# Patient Record
Sex: Male | Born: 1945 | Race: White | Hispanic: No | Marital: Married | Smoking: Never smoker
Health system: Southern US, Community
[De-identification: ages and names within clinical notes are randomized; demographics above are authoritative.]

## PROBLEM LIST (undated history)

## (undated) DIAGNOSIS — I639 Cerebral infarction, unspecified: Secondary | ICD-10-CM

## (undated) DIAGNOSIS — I10 Essential (primary) hypertension: Secondary | ICD-10-CM

## (undated) DIAGNOSIS — K219 Gastro-esophageal reflux disease without esophagitis: Secondary | ICD-10-CM

## (undated) DIAGNOSIS — C801 Malignant (primary) neoplasm, unspecified: Secondary | ICD-10-CM

## (undated) DIAGNOSIS — T8859XA Other complications of anesthesia, initial encounter: Secondary | ICD-10-CM

## (undated) DIAGNOSIS — E785 Hyperlipidemia, unspecified: Secondary | ICD-10-CM

## (undated) DIAGNOSIS — E559 Vitamin D deficiency, unspecified: Secondary | ICD-10-CM

## (undated) DIAGNOSIS — Z860100 Personal history of colon polyps, unspecified: Secondary | ICD-10-CM

## (undated) DIAGNOSIS — E119 Type 2 diabetes mellitus without complications: Secondary | ICD-10-CM

## (undated) DIAGNOSIS — T4145XA Adverse effect of unspecified anesthetic, initial encounter: Secondary | ICD-10-CM

## (undated) DIAGNOSIS — Z9889 Other specified postprocedural states: Secondary | ICD-10-CM

## (undated) DIAGNOSIS — R112 Nausea with vomiting, unspecified: Secondary | ICD-10-CM

## (undated) HISTORY — PX: ESOPHAGUS SURGERY: SHX626

## (undated) HISTORY — DX: Gastro-esophageal reflux disease without esophagitis: K21.9

## (undated) HISTORY — DX: Personal history of colon polyps, unspecified: Z86.0100

## (undated) HISTORY — PX: COLONOSCOPY W/ POLYPECTOMY: SHX1380

## (undated) HISTORY — PX: TONSILLECTOMY: SUR1361

## (undated) HISTORY — DX: Personal history of colonic polyps: Z86.010

## (undated) HISTORY — DX: Hyperlipidemia, unspecified: E78.5

## (undated) HISTORY — DX: Vitamin D deficiency, unspecified: E55.9

---

## 1998-11-16 ENCOUNTER — Emergency Department (HOSPITAL_COMMUNITY): Admission: EM | Admit: 1998-11-16 | Discharge: 1998-11-16 | Payer: Self-pay | Admitting: Emergency Medicine

## 2000-08-27 ENCOUNTER — Ambulatory Visit (HOSPITAL_COMMUNITY): Admission: RE | Admit: 2000-08-27 | Discharge: 2000-08-27 | Payer: Self-pay | Admitting: Gastroenterology

## 2003-08-31 ENCOUNTER — Emergency Department (HOSPITAL_COMMUNITY): Admission: AD | Admit: 2003-08-31 | Discharge: 2003-08-31 | Payer: Self-pay | Admitting: Emergency Medicine

## 2003-09-05 ENCOUNTER — Emergency Department (HOSPITAL_COMMUNITY): Admission: EM | Admit: 2003-09-05 | Discharge: 2003-09-06 | Payer: Self-pay | Admitting: Emergency Medicine

## 2003-12-05 ENCOUNTER — Ambulatory Visit (HOSPITAL_COMMUNITY): Admission: RE | Admit: 2003-12-05 | Discharge: 2003-12-05 | Payer: Self-pay | Admitting: Internal Medicine

## 2008-02-18 ENCOUNTER — Inpatient Hospital Stay (HOSPITAL_COMMUNITY): Admission: EM | Admit: 2008-02-18 | Discharge: 2008-02-19 | Payer: Self-pay | Admitting: Emergency Medicine

## 2008-02-18 ENCOUNTER — Ambulatory Visit: Payer: Self-pay | Admitting: Cardiology

## 2010-10-21 NOTE — H&P (Signed)
NAME:  Ivan Stark, Ivan Stark NO.:  [id number]   MEDICAL RECORD NO.:  [id number]          PATIENT TYPE:  EMS   LOCATION:  MAJO                         FACILITY:  [facility]   PHYSICIAN:  Vania Rea, M.D. DATE OF BIRTH:  [birth year]   DATE OF ADMISSION:  02/17/2008  DATE OF DISCHARGE:                              HISTORY & PHYSICAL   PRIMARY CARE PHYSICIAN:  Lucky Cowboy, M.D.   CHIEF COMPLAINT:  Chest pain.   HISTORY OF PRESENT ILLNESS:  This is a 65 year old Caucasian gentleman  with a history of diabetes and hypertension with no previous cardiac  workup who was awakened from sleep at about 1 a.m. this morning with a  5/10 central chest pain radiating into his left axilla.  The patient  took an aspirin and eventually went back to sleep.  Later today the  patient was out mowing the lawn and said when he went back to rest he  had sudden onset of the same chest pain radiating to his arm.  There was  no associated nausea, diaphoresis, shortness of breath, or dizziness.  However, the patient took another aspirin and did not get relief, but  decided to come to the emergency room.  After about 2 hours his pain  spontaneously resolved.  The patient has no palpitations, no lower  extremity edema, and no shortness of breath, but because of his history  of diabetes and hypertension and his age, Hospitalists Service was  called to assist with management.   PAST MEDICAL HISTORY:  As noted above.   MEDICATIONS:  1. Amaryl unknown dose.  2. Blood pressure medication unknown name.  3. Aspirin 81 mg daily.  4. Multivitamin and vitamin C daily.   ALLERGIES:  Allergic to an ANTIBIOTIC, he is unsure of the name.   SOCIAL HISTORY:  He is a priest by religion.  He denies tobacco,  alcohol, or illicit drug use.   FAMILY HISTORY:  There is no family history of coronary artery disease,  strokes, hypertension, or diabetes.   REVIEW OF SYSTEMS:  Other than noted above, a 10-point  review of systems  is significant only for the fact that he says he has been undergoing a  lot of personal stress recently related to his job.   PHYSICAL EXAMINATION:  GENERAL:  A pleasant, somewhat obese, middle-age  Caucasian gentleman lying on the stretcher in no acute distress.  VITAL SIGNS:  Temperature 98.3, pulse 72, respirations 16, blood  pressure __________, he is saturating at 97% on 2 liters.  HEENT:  His pupils are round and equal.  Mucous membranes are pink and  anicteric.  He has no jugular venous distention, no thyromegaly, no  carotid bruits.  CHEST:  Clear to auscultation bilaterally.  CARDIOVASCULAR:  Regular rhythm without murmur.  ABDOMEN:  Obese, soft, and nontender.  EXTREMITIES:  Without edema.  CENTRAL NERVOUS SYSTEM:  Cranial nerves II-XII grossly intact.  He has  no focal neurologic deficit.   LABORATORY DATA:  His white count is slightly elevated at 11.4, his CBC  is otherwise unremarkable.  His serum chemistry is  likewise  unremarkable.  His serum glucose is remarkably normal at 109.  Her serum  potassium is 4.7.   Chest x-ray shows no acute abnormalities.  His EKG done in 2005 showed  sinus bradycardia and was otherwise normal.  His current EKG is not  available to me at this time, but reportedly showed sinus rhythm with no  acute abnormalities.   Cardiac enzymes are completely normal with undetectable CK-MB and  undetectable troponin.   ASSESSMENT:  1. Chest pain.  Likelihood of acute coronary syndrome is low in the      setting of normal cardiac enzymes after prolonged chest pain.  2. Hypertension, controlled.  3. Diabetes type 2, controlled.   PLAN:  We will bring this gentleman overnight for serial cardiac enzymes  and will get treadmill stress test in the morning because of his age and  other risk factors.  We will follow up on his EKG and we will get EKG  p.r.n. for chest pain.  Other plans as per orders.      Vania Rea, M.D.   Electronically Signed     LC/MEDQ  D:  02/17/2008  T:  02/18/2008  Job:  [id number]   cc:   Lucky Cowboy, M.D.

## 2010-10-21 NOTE — Discharge Summary (Signed)
NAME:  Ivan Stark, ABREU NO.:  [id number]   MEDICAL RECORD NO.:  [id number]          PATIENT TYPE:  INP   LOCATION:  4742                         FACILITY:  [facility]   PHYSICIAN:  Theodosia Paling, MD    DATE OF BIRTH:  Nov 13, 1945   DATE OF ADMISSION:  02/17/2008  DATE OF DISCHARGE:                               DISCHARGE SUMMARY   ADMITTING HISTORY:  Please refer to the admission note dictated by Dr.  Vania Rea on February 18, 2008.   DISCHARGE DIAGNOSES:  1. Chest pain of noncardiac etiology, most likely related to the      gastrointestinal source/gastroesophageal reflux disease.  2. Hypertension.  3. Gastroesophageal reflux disease.  4. Diabetes mellitus.   DISCHARGE MEDICATIONS:  The patient is to continue home medication for  diabetes and hypertension whose name he does not remember at this time.  The patient was on sliding-scale insulin and captopril while he was  admitted to the hospital.   NEW MEDICATIONS:  The patient is to start taking Protonix 40 mg p.o.  daily along with the p.r.n. Maalox for GERD.   HOSPITAL COURSE:  Following issues were addressed during the  hospitalization:  1. Chest pain.  The patient had negative cardiac enzyme.  He did not      have any recurrence of the chest pain.  He underwent a stress test,      which was negative.  His telemetry did not show any changes.  The      patient is recommended to continue low-dose aspirin, most likely      caused this GERD or related to GI.  2. GERD.  The patient is to continue PPI.  The patient also has a      history of dysphagia.  Therefore, he was recommended to have an      outpatient endoscopy.  He has a GI specialist with a history of      GERD.  He has undergone endoscopy in the past.  He understands that      it is important that he gets it done as an outpatient earlier the      better.  3. Hypertension.  The patient's blood pressure was within normal      range.  Captopril was  started in the hospital.  4. Diabetes.  His glucose were within normal limits, on sliding-scale      insulin.   DISPOSITION:  1. The patient is to follow with primary care physician in 1 week's      time.  2. The patient is to follow with the GI specialist for outpatient      endoscopy evaluation.   PROCEDURE PERFORMED:  Myocardial perfusion test showed EF of 60% with  end-diastolic volume of 107.  Fixed defect in the inferior wall, most  likely diaphragmatic attenuation.  No stress induced ischemia.  A chest  x-ray was done on admission, which showed no acute findings or  infiltrates.   Total time spent in discharge of this patient is 35 minutes.      Theodosia Paling, MD  Electronically Signed     NP/MEDQ  D:  02/19/2008  T:  02/19/2008  Job:  [id number]   cc:   Lucky Cowboy, M.D.

## 2011-03-11 LAB — POCT I-STAT, CHEM 8
Calcium, Ion: 1.17
Creatinine, Ser: 1.2
Glucose, Bld: 109 — ABNORMAL HIGH
Hemoglobin: 17.7 — ABNORMAL HIGH
Sodium: 140
TCO2: 24

## 2011-03-11 LAB — GLUCOSE, CAPILLARY
Glucose-Capillary: 140 — ABNORMAL HIGH
Glucose-Capillary: 158 — ABNORMAL HIGH
Glucose-Capillary: 171 — ABNORMAL HIGH
Glucose-Capillary: 190 — ABNORMAL HIGH

## 2011-03-11 LAB — DIFFERENTIAL
Basophils Absolute: 0
Basophils Relative: 0
Eosinophils Absolute: 0.1
Eosinophils Relative: 1
Lymphocytes Relative: 27

## 2011-03-11 LAB — URINALYSIS, ROUTINE W REFLEX MICROSCOPIC
Glucose, UA: NEGATIVE
Nitrite: NEGATIVE
pH: 5

## 2011-03-11 LAB — CARDIAC PANEL(CRET KIN+CKTOT+MB+TROPI)
CK, MB: 0.7
Relative Index: INVALID
Troponin I: 0.01
Troponin I: <0.01

## 2011-03-11 LAB — CBC
HCT: 51.9
MCHC: 32.7
Platelets: 254
RDW: 13.5

## 2011-03-11 LAB — TROPONIN I: Troponin I: 0.01

## 2011-03-11 LAB — CK TOTAL AND CKMB (NOT AT ARMC)
CK, MB: 0.8
Total CK: 45

## 2011-03-11 LAB — PROTIME-INR: Prothrombin Time: 12.9

## 2011-03-11 LAB — HEMOGLOBIN A1C
Hgb A1c MFr Bld: 7.8 — ABNORMAL HIGH
Mean Plasma Glucose: 177

## 2011-03-11 LAB — TSH: TSH: 1.341

## 2011-03-11 LAB — LIPID PANEL: VLDL: 23

## 2012-07-31 ENCOUNTER — Encounter (HOSPITAL_COMMUNITY): Payer: Self-pay | Admitting: Physical Medicine and Rehabilitation

## 2012-07-31 ENCOUNTER — Emergency Department (HOSPITAL_COMMUNITY): Payer: Medicare Other

## 2012-07-31 ENCOUNTER — Emergency Department (HOSPITAL_COMMUNITY)
Admission: EM | Admit: 2012-07-31 | Discharge: 2012-07-31 | Disposition: A | Discharge status: Home / Self Care | Payer: Medicare Other | Attending: Emergency Medicine | Admitting: Emergency Medicine

## 2012-07-31 DIAGNOSIS — I10 Essential (primary) hypertension: Secondary | ICD-10-CM | POA: Insufficient documentation

## 2012-07-31 DIAGNOSIS — R0789 Other chest pain: Secondary | ICD-10-CM | POA: Insufficient documentation

## 2012-07-31 DIAGNOSIS — Z79899 Other long term (current) drug therapy: Secondary | ICD-10-CM | POA: Insufficient documentation

## 2012-07-31 DIAGNOSIS — E119 Type 2 diabetes mellitus without complications: Secondary | ICD-10-CM | POA: Insufficient documentation

## 2012-07-31 DIAGNOSIS — Z7982 Long term (current) use of aspirin: Secondary | ICD-10-CM | POA: Insufficient documentation

## 2012-07-31 DIAGNOSIS — R079 Chest pain, unspecified: Secondary | ICD-10-CM

## 2012-07-31 HISTORY — DX: Type 2 diabetes mellitus without complications: E11.9

## 2012-07-31 HISTORY — DX: Essential (primary) hypertension: I10

## 2012-07-31 LAB — BASIC METABOLIC PANEL
BUN: 15 mg/dL (ref 6–23)
Chloride: 105 mEq/L (ref 96–112)
Creatinine, Ser: 0.87 mg/dL (ref 0.50–1.35)
GFR calc Af Amer: >90 mL/min (ref >90)

## 2012-07-31 LAB — CBC WITH DIFFERENTIAL/PLATELET
Basophils Relative: 1 % (ref 0–1)
Eosinophils Absolute: 0.1 10*3/uL (ref 0.0–0.7)
HCT: 43.8 % (ref 39.0–52.0)
Hemoglobin: 15.6 g/dL (ref 13.0–17.0)
MCH: 31.1 pg (ref 26.0–34.0)
MCHC: 35.6 g/dL (ref 30.0–36.0)
Monocytes Absolute: 0.7 10*3/uL (ref 0.1–1.0)
Monocytes Relative: 9 % (ref 3–12)
Neutro Abs: 5 10*3/uL (ref 1.7–7.7)

## 2012-07-31 MED ORDER — ASPIRIN 81 MG PO CHEW
324.0000 mg | CHEWABLE_TABLET | Freq: Once | ORAL | Status: AC
  Administered 2012-07-31: 324 mg via ORAL
  Filled 2012-07-31: qty 4

## 2012-07-31 NOTE — ED Notes (Signed)
Pt presents to department for evaluation of L sided non radiating chest pain. Ongoing x2 days. States pain became worse this morning, increases with deep breathing. Denies pain at the time. Pt is conscious alert and oriented x4. Respirations unlabored. Skin warm and dry.

## 2012-07-31 NOTE — ED Provider Notes (Addendum)
History     CSN: [id number]  Arrival date & time 07/31/12  2130   First MD Initiated Contact with Patient 07/31/12 [phone]      Chief Complaint  Patient presents with  . Chest Pain    (Consider location/radiation/quality/duration/timing/severity/associated sxs/prior treatment) HPI Comments: Ivan Stark is a 67 y.o. male who states that he has had chest pain for 2 days that waxes and wanes. The pain is constant. It does not keep him awake at night. The pain is 1-2/10, and is characterized as a dullness. It is, in the left anterior chest. It does not radiate. He has not had any associated fever, chills, cough, sweating, nausea, vomiting, abdominal pain, back pain, weakness, or dizziness. He's never had this before. He took some aspirin during the night, when he awoke. He is not sure why he awoke at around midnight, but was able to sleep, afterwards. There are no known modifying factors.  Patient is a 67 y.o. male presenting with chest pain. The history is provided by the patient.  Chest Pain   Past Medical History  Diagnosis Date  . Hypertension   . Diabetes mellitus without complication     No past surgical history on file.  History reviewed. No pertinent family history.  History  Substance Use Topics  . Smoking status: Never Smoker   . Smokeless tobacco: Not on file  . Alcohol Use: No      Review of Systems  Cardiovascular: Positive for chest pain.  All other systems reviewed and are negative.    Allergies  Review of patient's allergies indicates no known allergies.  Home Medications   Current Outpatient Rx  Name  Route  Sig  Dispense  Refill  . Ascorbic Acid (VITAMIN C) 1000 MG tablet   Oral   Take 1,000 mg by mouth daily.         Marland Kitchen aspirin EC 81 MG tablet   Oral   Take 81 mg by mouth daily.         . Cholecalciferol (VITAMIN D) 2000 UNITS CAPS   Oral   Take 2 capsules by mouth daily.         . enalapril (VASOTEC) 20 MG tablet   Oral   Take 20  mg by mouth at bedtime.         Marland Kitchen glimepiride (AMARYL) 4 MG tablet   Oral   Take 4 mg by mouth 2 (two) times daily with a meal.         . Multiple Vitamin (MULTIVITAMIN WITH MINERALS) TABS   Oral   Take 1 tablet by mouth daily.         . pravastatin (PRAVACHOL) 40 MG tablet   Oral   Take 40 mg by mouth at bedtime.           BP 131/104  Pulse 57  Temp(Src) 98 F (36.7 C) (Oral)  Resp 14  SpO2 99%  Physical Exam  Nursing note and vitals reviewed. Constitutional: He is oriented to person, place, and time. He appears well-developed and well-nourished.  HENT:  Head: Normocephalic and atraumatic.  Right Ear: External ear normal.  Left Ear: External ear normal.  Eyes: Conjunctivae and EOM are normal. Pupils are equal, round, and reactive to light.  Neck: Normal range of motion and phonation normal. Neck supple.  Cardiovascular: Normal rate, regular rhythm, normal heart sounds and intact distal pulses.   Pulmonary/Chest: Effort normal and breath sounds normal. He exhibits no bony tenderness.  Abdominal: Soft. Normal appearance. There is no tenderness.  Musculoskeletal: Normal range of motion.  Neurological: He is alert and oriented to person, place, and time. He has normal strength. No cranial nerve deficit or sensory deficit. He exhibits normal muscle tone. Coordination normal.  Skin: Skin is warm, dry and intact.  Psychiatric: He has a normal mood and affect. His behavior is normal. Judgment and thought content normal.    ED Course  Procedures (including critical care time)  Aspirin ordered  Reevaluation:- No recurrence of the CP. Vitals are stable.    Date: 03/25/2012  Rate: 61  Rhythm: normal sinus rhythm  QRS Axis: normal  PR and QT Intervals: normal  ST/T Wave abnormalities: normal  PR and QRS Conduction Disutrbances:none  Narrative Interpretation:   Old EKG Reviewed: unchanged    Labs Reviewed  BASIC METABOLIC PANEL - Abnormal; Notable for the  following:    Glucose, Bld 157 (*)    GFR calc non Af Amer 89 (*)    All other components within normal limits  CBC WITH DIFFERENTIAL  POCT I-STAT TROPONIN I   Dg Chest 2 View  07/31/2012  *RADIOLOGY REPORT*  Clinical Data: left-sided chest pain  CHEST - 2 VIEW  Comparison: 02/17/2008  Findings: Cardiomediastinal silhouette is stable.  No acute infiltrate or pleural effusion.  No pulmonary edema.  Bony thorax is stable.  IMPRESSION: No active disease.  No significant change.   Original Report Authenticated By: Natasha Mead, M.D.    Nursing Notes Reviewed/ Care Coordinated Applicable Imaging Reviewed Interpretation of Laboratory Data incorporated into ED treatment  1. Nonspecific chest pain       MDM  Nonspecific chest pain, with negative ED evaluation. Doubt ACS, PE, or pneumonia. He is stable for discharge. He has cardiac risk factors, TIMI 1, and can be evaluated, and treated as an outpatient. He does have a regular primary care provider.   Plan: Home Medications- usual and increase to 325 ASA q day; Home Treatments- rest; Recommended follow up- PCP for stress test asap    Flint Melter, MD 07/31/12 1300  Flint Melter, MD 07/31/12 [phone]

## 2013-05-15 ENCOUNTER — Encounter: Payer: Self-pay | Admitting: Internal Medicine

## 2013-05-15 DIAGNOSIS — E785 Hyperlipidemia, unspecified: Secondary | ICD-10-CM | POA: Insufficient documentation

## 2013-05-15 DIAGNOSIS — I10 Essential (primary) hypertension: Secondary | ICD-10-CM | POA: Insufficient documentation

## 2013-05-15 DIAGNOSIS — E559 Vitamin D deficiency, unspecified: Secondary | ICD-10-CM | POA: Insufficient documentation

## 2013-05-15 DIAGNOSIS — K219 Gastro-esophageal reflux disease without esophagitis: Secondary | ICD-10-CM | POA: Insufficient documentation

## 2013-05-15 DIAGNOSIS — Z86010 Personal history of colonic polyps: Secondary | ICD-10-CM | POA: Insufficient documentation

## 2013-05-17 ENCOUNTER — Ambulatory Visit: Payer: Self-pay | Admitting: Emergency Medicine

## 2013-06-22 ENCOUNTER — Other Ambulatory Visit: Payer: Self-pay | Admitting: Internal Medicine

## 2013-06-26 ENCOUNTER — Other Ambulatory Visit: Payer: Self-pay | Admitting: Internal Medicine

## 2013-07-17 ENCOUNTER — Other Ambulatory Visit: Payer: Self-pay | Admitting: Internal Medicine

## 2013-08-03 ENCOUNTER — Other Ambulatory Visit: Payer: Self-pay | Admitting: Internal Medicine

## 2013-08-16 ENCOUNTER — Other Ambulatory Visit: Payer: Self-pay | Admitting: Internal Medicine

## 2013-08-20 ENCOUNTER — Encounter: Payer: Self-pay | Admitting: Internal Medicine

## 2013-08-20 DIAGNOSIS — N181 Chronic kidney disease, stage 1: Secondary | ICD-10-CM

## 2013-08-20 DIAGNOSIS — Z79899 Other long term (current) drug therapy: Secondary | ICD-10-CM | POA: Insufficient documentation

## 2013-08-20 DIAGNOSIS — E1122 Type 2 diabetes mellitus with diabetic chronic kidney disease: Secondary | ICD-10-CM | POA: Insufficient documentation

## 2013-08-20 NOTE — Progress Notes (Signed)
Patient ID: Ivan Stark, male   DOB: Sep 18, 1945, 68 y.o.   MRN: [id number]   Annual Screening Comprehensive Examination  This very nice 68 y.o.  MWM presents for complete physical.  Patient has been followed for HTN,  T2 NIDDM, Hyperlipidemia, and Vitamin D Deficiency.   HTN predates since 1998. Patient's BP has been controlled at home.Today's BP: 120/82 mmHg. Patient denies any cardiac symptoms as chest pain, palpitations, shortness of breath, dizziness or ankle swelling.   Patient's hyperlipidemia is not controlled with diet and he's been off of his Pravastatin. Patient denies myalgias or other medication SE's. Last cholesterol last visit was  237, triglycerides 166, HDL 41 and LDL 163 in Sept 2014 - not at goal.     Patient has T2 NIDDM since 1998 with last A1c 9.4% in Sept 2014  representative of his poor compliance over the years. Patient denies reactive hypoglycemic symptoms, visual blurring, diabetic polys, or paresthesias.   Finally, patient has history of Vitamin D Deficiency  Of 32 in 2010 andbwith last vitamin D 83 in Sept 2014.     Medication Sig  . Ascorbic Acid (VITAMIN C) 1000 MG tablet Take 1,000 mg by mouth daily.  Marland Kitchen aspirin EC 81 MG tablet Take 81 mg by mouth daily.  . Cholecalciferol (VITAMIN D) 2000 UNITS CAPS Take 2 capsules by mouth daily.  . enalapril (VASOTEC) 20 MG tablet TAKE ONE TABLET BY MOUTH ONCE DAILY  . metFORMIN (GLUCOPHAGE-XR) 500 MG 24 hr tablet TAKE FOUR TABLETS BY MOUTH ONCE DAILY  . Multiple Vitamin (MULTIVITAMIN WITH MINERALS) TABS Take 1 tablet by mouth daily.  . pravastatin (PRAVACHOL) 40 MG tablet Take 40 mg by mouth at bedtime.      Allergies  Allergen Reactions  . Atenolol   . Lipitor [Atorvastatin]     Itch    Past Medical History  Diagnosis Date  . Diabetes mellitus without complication   . Hypertension   . Hyperlipidemia   . GERD (gastroesophageal reflux disease)   . Vitamin D deficiency   . History of colon polyps     Past  Surgical History  Procedure Laterality Date  . Tonsillectomy      Family History  Problem Relation Age of Onset  . Goiter Mother   . Hypertension Father   . Cancer Father     colon    History   Social History  . Marital Status: Married    Spouse Name: N/A    Number of Children: N/A  . Years of Education: N/A   Occupational History  . [employer] x 44 years.   Social History Main Topics  . Smoking status: Never Smoker   . Smokeless tobacco: Not on file  . Alcohol Use: No  . Drug Use: No  . Sexual Activity: Not on file    ROS Constitutional: Denies fever, chills, weight loss/gain, headaches, insomnia, fatigue, night sweats, and change in appetite. Eyes: Denies redness, blurred vision, diplopia, discharge, itchy, watery eyes.  ENT: Denies discharge, congestion, post nasal drip, epistaxis, sore throat, earache, hearing loss, dental pain, Tinnitus, Vertigo, Sinus pain, snoring.  Cardio: Denies chest pain, palpitations, irregular heartbeat, syncope, dyspnea, diaphoresis, orthopnea, PND, claudication, edema Respiratory: denies cough, dyspnea, DOE, pleurisy, hoarseness, laryngitis, wheezing.  Gastrointestinal: Denies dysphagia, heartburn, reflux, water brash, pain, cramps, nausea, vomiting, bloating, diarrhea, constipation, hematemesis, melena, hematochezia, jaundice, hemorrhoids Genitourinary: Denies dysuria, frequency, urgency, nocturia, hesitancy, discharge, hematuria, flank pain Musculoskeletal: Denies arthralgia, myalgia, stiffness, Jt. Swelling, pain, limp, and strain/sprain. Skin:  Denies puritis, rash, hives, warts, acne, eczema, changing in skin lesion Neuro: No weakness, tremor, incoordination, spasms, paresthesia, pain Psychiatric: Denies confusion, memory loss, sensory loss Endocrine: Denies change in weight, skin, hair change, nocturia, and paresthesia, diabetic polys, visual blurring, hyper / hypo glycemic episodes.  Heme/Lymph: No excessive bleeding, bruising, or  elarged lymph nodes.  Physical Exam  BP 120/82  Pulse 72  Temp(Src) 97.9 F (36.6 C) (Temporal)  Resp 16  Ht 6\' 4"  (1.93 m)  Wt 213 lb (96.616 kg)  BMI 25.94 kg/m2  General Appearance: Well nourished, in no apparent distress. Eyes: PERRLA, EOMs, conjunctiva no swelling or erythema, normal fundi and vessels. Sinuses: No frontal/maxillary tenderness ENT/Mouth: EACs patent / TMs  nl. Nares clear without erythema, swelling, mucoid exudates. Oral hygiene is good. No erythema, swelling, or exudate. Tongue normal, non-obstructing. Tonsils not swollen or erythematous. Hearing normal.  Neck: Supple, thyroid normal. No bruits, nodes or JVD. Respiratory: Respiratory effort normal.  BS equal and clear bilateral without rales, rhonci, wheezing or stridor. Cardio: Heart sounds are normal with regular rate and rhythm and no murmurs, rubs or gallops. Peripheral pulses are normal and equal bilaterally without edema. No aortic or femoral bruits. Chest: symmetric with normal excursions and percussion.  Abdomen: Flat, soft, with bowl sounds. Nontender, no guarding, rebound, hernias, masses, or organomegaly.  Lymphatics: Non tender without lymphadenopathy.  Genitourinary: No hernias.Testes nl. DRE - prostate nl for age - smooth & firm w/o nodules. Musculoskeletal: Full ROM all peripheral extremities, joint stability, 5/5 strength, and normal gait. Skin: Warm and dry without rashes, lesions, cyanosis, clubbing or  ecchymosis.  Neuro: Cranial nerves intact, reflexes equal bilaterally. Normal muscle tone, no cerebellar symptoms. Sensation intact.  Pysch: Awake and oriented X 3, normal affect, insight and judgment appropriate.   Assessment and Plan  1. Annual Screening Examination 2. Hypertension  3. Hyperlipidemia 4. T2 NIDDM 5. Vitamin D Deficiency  Continue prudent diet as discussed, weight control, BP monitoring, regular exercise, and medications as discussed.  Discussed med effects and SE's. Routine  screening labs and tests as requested with regular follow-up as recommended.

## 2013-08-20 NOTE — Patient Instructions (Signed)

## 2013-08-21 ENCOUNTER — Encounter: Payer: Self-pay | Admitting: Internal Medicine

## 2013-08-21 ENCOUNTER — Ambulatory Visit (INDEPENDENT_AMBULATORY_CARE_PROVIDER_SITE_OTHER): Payer: Medicare Other | Admitting: Internal Medicine

## 2013-08-21 VITALS — BP 120/82 | HR 72 | Temp 97.9°F | Resp 16 | Ht 76.0 in | Wt 213.0 lb

## 2013-08-21 DIAGNOSIS — E785 Hyperlipidemia, unspecified: Secondary | ICD-10-CM

## 2013-08-21 DIAGNOSIS — Z1212 Encounter for screening for malignant neoplasm of rectum: Secondary | ICD-10-CM

## 2013-08-21 DIAGNOSIS — Z79899 Other long term (current) drug therapy: Secondary | ICD-10-CM

## 2013-08-21 DIAGNOSIS — Z9181 History of falling: Secondary | ICD-10-CM

## 2013-08-21 DIAGNOSIS — Z125 Encounter for screening for malignant neoplasm of prostate: Secondary | ICD-10-CM

## 2013-08-21 DIAGNOSIS — Z1331 Encounter for screening for depression: Secondary | ICD-10-CM

## 2013-08-21 DIAGNOSIS — I10 Essential (primary) hypertension: Secondary | ICD-10-CM

## 2013-08-21 DIAGNOSIS — E559 Vitamin D deficiency, unspecified: Secondary | ICD-10-CM

## 2013-08-21 DIAGNOSIS — E119 Type 2 diabetes mellitus without complications: Secondary | ICD-10-CM

## 2013-08-21 LAB — CBC WITH DIFFERENTIAL/PLATELET
Basophils Absolute: 0.1 10*3/uL (ref 0.0–0.1)
Basophils Relative: 1 % (ref 0–1)
Eosinophils Absolute: 0.2 10*3/uL (ref 0.0–0.7)
Eosinophils Relative: 2 % (ref 0–5)
HCT: 46.1 % (ref 39.0–52.0)
Hemoglobin: 15.9 g/dL (ref 13.0–17.0)
LYMPHS PCT: 26 % (ref 12–46)
Lymphs Abs: 2.4 10*3/uL (ref 0.7–4.0)
MCH: 30.1 pg (ref 26.0–34.0)
MCHC: 34.5 g/dL (ref 30.0–36.0)
MCV: 87.3 fL (ref 78.0–100.0)
Monocytes Absolute: 0.9 10*3/uL (ref 0.1–1.0)
Monocytes Relative: 10 % (ref 3–12)
NEUTROS PCT: 61 % (ref 43–77)
Neutro Abs: 5.6 10*3/uL (ref 1.7–7.7)
PLATELETS: 249 10*3/uL (ref 150–400)
RBC: 5.28 MIL/uL (ref 4.22–5.81)
RDW: 13.5 % (ref 11.5–15.5)
WBC: 9.1 10*3/uL (ref 4.0–10.5)

## 2013-08-21 LAB — HEMOGLOBIN A1C
HEMOGLOBIN A1C: 8.4 % — AB (ref <5.7)
Mean Plasma Glucose: 194 mg/dL — ABNORMAL HIGH (ref <117)

## 2013-08-21 MED ORDER — GLIMEPIRIDE 4 MG PO TABS: ORAL_TABLET | ORAL | Status: DC

## 2013-08-22 LAB — BASIC METABOLIC PANEL WITH GFR
BUN: 12 mg/dL (ref 6–23)
CALCIUM: 9.3 mg/dL (ref 8.4–10.5)
CO2: 27 meq/L (ref 19–32)
Chloride: 102 mEq/L (ref 96–112)
Creat: 0.92 mg/dL (ref 0.50–1.35)
GFR, EST NON AFRICAN AMERICAN: 86 mL/min
Glucose, Bld: 144 mg/dL — ABNORMAL HIGH (ref 70–99)
Potassium: 4.5 mEq/L (ref 3.5–5.3)
SODIUM: 137 meq/L (ref 135–145)

## 2013-08-22 LAB — URINALYSIS, MICROSCOPIC ONLY
Bacteria, UA: NONE SEEN
CASTS: NONE SEEN
Crystals: NONE SEEN
SQUAMOUS EPITHELIAL / LPF: NONE SEEN

## 2013-08-22 LAB — HEPATIC FUNCTION PANEL
ALT: 21 U/L (ref 0–53)
AST: 19 U/L (ref 0–37)
Albumin: 3.5 g/dL (ref 3.5–5.2)
Alkaline Phosphatase: 73 U/L (ref 39–117)
BILIRUBIN TOTAL: 0.7 mg/dL (ref 0.2–1.2)
Bilirubin, Direct: 0.1 mg/dL (ref 0.0–0.3)
Indirect Bilirubin: 0.6 mg/dL (ref 0.2–1.2)
Total Protein: 6 g/dL (ref 6.0–8.3)

## 2013-08-22 LAB — LIPID PANEL
CHOL/HDL RATIO: 6.6 ratio
Cholesterol: 246 mg/dL — ABNORMAL HIGH (ref 0–200)
HDL: 37 mg/dL — AB (ref >39)
LDL CALC: 169 mg/dL — AB (ref 0–99)
Triglycerides: 199 mg/dL — ABNORMAL HIGH (ref <150)
VLDL: 40 mg/dL (ref 0–40)

## 2013-08-22 LAB — MAGNESIUM: Magnesium: 1.9 mg/dL (ref 1.5–2.5)

## 2013-08-22 LAB — TSH: TSH: 1.175 u[IU]/mL (ref 0.350–4.500)

## 2013-08-22 LAB — INSULIN, FASTING: INSULIN FASTING, SERUM: 15 u[IU]/mL (ref 3–28)

## 2013-08-22 LAB — MICROALBUMIN / CREATININE URINE RATIO
CREATININE, URINE: 64.5 mg/dL
MICROALB UR: 0.5 mg/dL (ref 0.00–1.89)
Microalb Creat Ratio: 7.8 mg/g (ref 0.0–30.0)

## 2013-08-22 LAB — PSA: PSA: 1.54 ng/mL (ref <=4.00)

## 2013-08-22 LAB — VITAMIN D 25 HYDROXY (VIT D DEFICIENCY, FRACTURES): Vit D, 25-Hydroxy: 96 ng/mL — ABNORMAL HIGH (ref 30–89)

## 2013-08-23 ENCOUNTER — Other Ambulatory Visit: Payer: Self-pay | Admitting: Internal Medicine

## 2013-08-23 MED ORDER — ATORVASTATIN CALCIUM 80 MG PO TABS: 80.0000 mg | ORAL_TABLET | Freq: Every day | ORAL | Status: DC

## 2013-09-20 ENCOUNTER — Other Ambulatory Visit: Payer: Self-pay | Admitting: Internal Medicine

## 2013-11-21 ENCOUNTER — Other Ambulatory Visit: Payer: Self-pay | Admitting: Internal Medicine

## 2013-11-24 ENCOUNTER — Ambulatory Visit: Payer: Self-pay | Admitting: Physician Assistant

## 2013-12-20 ENCOUNTER — Other Ambulatory Visit: Payer: Self-pay | Admitting: Internal Medicine

## 2014-02-04 DIAGNOSIS — Z9114 Patient's other noncompliance with medication regimen: Secondary | ICD-10-CM | POA: Insufficient documentation

## 2014-02-04 NOTE — Progress Notes (Signed)
Patient ID: Ivan Stark, male   DOB: May 06, 1946, 68 y.o.   MRN: [id number]  This very nice 68 y.o.MWM came Aug 21, 2013 for last OV and now 5 + months later, patient as usual presents 2 months overdue for recommended follow-up primarily to be able to get his meds.. 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   HTN predates since     . Patient's BP has been controlled at home.Today's BP: 112/76 mmHg. Patient denies any cardiac symptoms as chest pain, palpitations, shortness of breath, dizziness or ankle swelling.   Patient's hyperlipidemia is controlled with diet and medications. Patient denies myalgias or other medication SE's. Last lipids were  Chol 246; HDL 37; LDL  169; Trig 199 on 08/21/2013.   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 Last A1c was 8.4% on 08/21/2013. Patient alleges dietary compliance. Patient relates that he stopped his Metformin and has used his Glimepiride.     Finally, patient has history of Vitamin D Deficiency of 32 in 2010 and last vitamin D was 96 on 08/21/2013.  Medication Sig  . VITAMIN C 1000 MG tablet Take 1,000 mg  daily.  Marland Kitchen aspirin EC 81 MG  Take 81 mg  daily.  Marland Kitchen atorvastatin  80 MG tablet Take 1 tablet  Daily for cholesterol  . VITAMIN D 2000 UNITS  Take 2 capsules  daily.  . enalapril  20 MG tablet TAKE 1 tab daily  . glimepiride  4 MG tablet TAKE 1/2 to 1 tab 2 x day as directed  . metFORMIN -XR 500 MG 24 hr tablet TAKE 4 tabs daily as directed  . MULTIVIT W/ MIN Take 1 tablet  daily.  . pravastatin  40 MG tablet Take 40 mg at bedtime.   Allergies  Allergen Reactions  . Atenolol   . Lipitor [Atorvastatin]     Itch   Past Medical History  Diagnosis Date  . Diabetes mellitus without complication   . Hypertension   . Hyperlipidemia   . GERD (gastroesophageal reflux disease)   .  Vitamin D deficiency   . History of colon polyps    Past Surgical History  Procedure Laterality Date  . Tonsillectomy     Family History  Problem Relation Age of Onset  . Goiter Mother   . Hypertension Father   . Cancer Father     colon   History   Social History  . Marital Status: Married    Spouse Name: N/A    Number of Children: N/A  . Years of Education: N/A   Occupational History  . 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 Smoking status: Never Smoker   . Smokeless tobacco: Not on file  . Alcohol Use: No  . Drug Use: No  . Sexual Activity: Not on file    ROS Constitutional: Denies fever, chills, weight loss/gain, headaches, insomnia, fatigue, night sweats or change in appetite. Eyes: Denies redness, blurred vision, diplopia, discharge, itchy or watery eyes.  ENT: Denies discharge, congestion, post nasal drip, epistaxis, sore throat, earache, hearing loss, dental pain, Tinnitus, Vertigo, Sinus pain or snoring.  Cardio: Denies chest pain, palpitations, irregular heartbeat, syncope, dyspnea, diaphoresis, orthopnea, PND, claudication or edema Respiratory: denies cough, dyspnea, DOE, pleurisy, hoarseness, laryngitis or wheezing.  Gastrointestinal: Denies dysphagia, heartburn, reflux, water brash, pain, cramps,  nausea, vomiting, bloating, diarrhea, constipation, hematemesis, melena, hematochezia, jaundice or hemorrhoids Genitourinary: Denies dysuria, frequency, urgency, nocturia, hesitancy, discharge, hematuria or flank pain Musculoskeletal: Denies arthralgia, myalgia, stiffness, Jt. Swelling, pain, limp or strain/sprain. Denies Falls. Skin: Denies puritis, rash, hives, warts, acne, eczema or change in skin lesion Neuro: No weakness, tremor, incoordination, spasms, paresthesia or pain Psychiatric: Denies confusion, memory loss or sensory loss. Denies Depression. Endocrine: Denies change in weight, skin, hair change, nocturia, and paresthesia,  diabetic polys, visual blurring or hyper / hypo glycemic episodes.  Heme/Lymph: No excessive bleeding, bruising or enlarged lymph nodes.   Physical Exam  BP 112/76  Pulse 64  Temp 98.1 F   Resp 16  Ht 6\' 4"    Wt 211 lb   BMI 25.69   General Appearance: Well nourished, in no apparent distress. Eyes: PERRLA, EOMs, conjunctiva no swelling or erythema, normal fundi and vessels. Sinuses: No frontal/maxillary tenderness ENT/Mouth: EACs patent / TMs  nl. Nares clear without erythema, swelling, mucoid exudates. Oral hygiene is good. No erythema, swelling, or exudate. Tongue normal, non-obstructing. Tonsils not swollen or erythematous. Hearing normal.  Neck: Supple, thyroid normal. No bruits, nodes or JVD. Respiratory: Respiratory effort normal.  BS equal and clear bilateral without rales, rhonci, wheezing or stridor. Cardio: Heart sounds are normal with regular rate and rhythm and no murmurs, rubs or gallops. Peripheral pulses are normal and equal bilaterally without edema. No aortic or femoral bruits. Chest: symmetric with normal excursions and percussion.  Musculoskeletal: Full ROM all peripheral extremities, joint stability, 5/5 strength, and normal gait. Skin: Warm and dry without rashes, lesions, cyanosis, clubbing or  ecchymosis.  Neuro: Cranial nerves intact, reflexes equal bilaterally. Normal muscle tone, no cerebellar symptoms. Sensation intact.  Pysch: Awake and oriented X 3with normal affect, insight and judgment appropriate.    Assessment and Plan  1. Comprehensive Examination 2. Hypertension  3. Hyperlipidemia4. 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 Vitamin D Deficiency 6. Poor Compliance   Continue prudent diet as discussed, weight control, BP monitoring, regular exercise, and medications as discussed.  Discussed med effects and SE's. Routine screening labs and tests as requested with regular follow-up as recommended.   Patient was urged to restart his Metformin and only use the  Glimepiride if CBG> 180 mg%.

## 2014-02-04 NOTE — Patient Instructions (Signed)

## 2014-02-05 ENCOUNTER — Encounter: Payer: Self-pay | Admitting: Internal Medicine

## 2014-02-05 ENCOUNTER — Other Ambulatory Visit: Payer: Self-pay | Admitting: Internal Medicine

## 2014-02-05 ENCOUNTER — Ambulatory Visit (INDEPENDENT_AMBULATORY_CARE_PROVIDER_SITE_OTHER): Payer: Medicare Other | Admitting: Internal Medicine

## 2014-02-05 VITALS — BP 112/76 | HR 64 | Temp 98.1°F | Resp 16 | Ht 76.0 in | Wt 211.0 lb

## 2014-02-05 DIAGNOSIS — E559 Vitamin D deficiency, unspecified: Secondary | ICD-10-CM

## 2014-02-05 DIAGNOSIS — Z9114 Patient's other noncompliance with medication regimen: Secondary | ICD-10-CM

## 2014-02-05 DIAGNOSIS — Z1212 Encounter for screening for malignant neoplasm of rectum: Secondary | ICD-10-CM

## 2014-02-05 DIAGNOSIS — Z91148 Patient's other noncompliance with medication regimen for other reason: Secondary | ICD-10-CM

## 2014-02-05 DIAGNOSIS — Z79899 Other long term (current) drug therapy: Secondary | ICD-10-CM

## 2014-02-05 DIAGNOSIS — E1129 Type 2 diabetes mellitus with other diabetic kidney complication: Secondary | ICD-10-CM

## 2014-02-05 DIAGNOSIS — Z125 Encounter for screening for malignant neoplasm of prostate: Secondary | ICD-10-CM

## 2014-02-05 DIAGNOSIS — Z1331 Encounter for screening for depression: Secondary | ICD-10-CM

## 2014-02-05 DIAGNOSIS — I150 Renovascular hypertension: Secondary | ICD-10-CM

## 2014-02-05 DIAGNOSIS — Z789 Other specified health status: Secondary | ICD-10-CM

## 2014-02-05 DIAGNOSIS — E785 Hyperlipidemia, unspecified: Secondary | ICD-10-CM

## 2014-02-05 DIAGNOSIS — R42 Dizziness and giddiness: Secondary | ICD-10-CM

## 2014-02-05 LAB — CBC WITH DIFFERENTIAL/PLATELET
Basophils Absolute: 0 10*3/uL (ref 0.0–0.1)
Basophils Relative: 0 % (ref 0–1)
Eosinophils Absolute: 0.1 10*3/uL (ref 0.0–0.7)
Eosinophils Relative: 2 % (ref 0–5)
HEMATOCRIT: 46.7 % (ref 39.0–52.0)
HEMOGLOBIN: 16 g/dL (ref 13.0–17.0)
LYMPHS PCT: 29 % (ref 12–46)
Lymphs Abs: 2.1 10*3/uL (ref 0.7–4.0)
MCH: 30.2 pg (ref 26.0–34.0)
MCHC: 34.3 g/dL (ref 30.0–36.0)
MCV: 88.3 fL (ref 78.0–100.0)
MONO ABS: 0.7 10*3/uL (ref 0.1–1.0)
MONOS PCT: 10 % (ref 3–12)
NEUTROS ABS: 4.4 10*3/uL (ref 1.7–7.7)
NEUTROS PCT: 59 % (ref 43–77)
Platelets: 273 10*3/uL (ref 150–400)
RBC: 5.29 MIL/uL (ref 4.22–5.81)
RDW: 13.5 % (ref 11.5–15.5)
WBC: 7.4 10*3/uL (ref 4.0–10.5)

## 2014-02-05 LAB — BASIC METABOLIC PANEL WITH GFR
BUN: 14 mg/dL (ref 6–23)
CHLORIDE: 105 meq/L (ref 96–112)
CO2: 25 mEq/L (ref 19–32)
Calcium: 9.7 mg/dL (ref 8.4–10.5)
Creat: 0.94 mg/dL (ref 0.50–1.35)
GFR, Est African American: >89 mL/min
GFR, Est Non African American: 84 mL/min
GLUCOSE: 136 mg/dL — AB (ref 70–99)
POTASSIUM: 4.5 meq/L (ref 3.5–5.3)
SODIUM: 140 meq/L (ref 135–145)

## 2014-02-05 LAB — LIPID PANEL
Cholesterol: 264 mg/dL — ABNORMAL HIGH (ref 0–200)
HDL: 38 mg/dL — ABNORMAL LOW (ref >39)
LDL CALC: 180 mg/dL — AB (ref 0–99)
Total CHOL/HDL Ratio: 6.9 Ratio
Triglycerides: 229 mg/dL — ABNORMAL HIGH (ref <150)
VLDL: 46 mg/dL — ABNORMAL HIGH (ref 0–40)

## 2014-02-05 LAB — HEPATIC FUNCTION PANEL
ALK PHOS: 72 U/L (ref 39–117)
ALT: 15 U/L (ref 0–53)
AST: 15 U/L (ref 0–37)
Albumin: 3.9 g/dL (ref 3.5–5.2)
BILIRUBIN INDIRECT: 0.6 mg/dL (ref 0.2–1.2)
Bilirubin, Direct: 0.1 mg/dL (ref 0.0–0.3)
TOTAL PROTEIN: 6.4 g/dL (ref 6.0–8.3)
Total Bilirubin: 0.7 mg/dL (ref 0.2–1.2)

## 2014-02-05 LAB — TSH: TSH: 0.858 u[IU]/mL (ref 0.350–4.500)

## 2014-02-05 LAB — HEMOGLOBIN A1C
HEMOGLOBIN A1C: 7.9 % — AB (ref <5.7)
MEAN PLASMA GLUCOSE: 180 mg/dL — AB (ref <117)

## 2014-02-05 LAB — MAGNESIUM: Magnesium: 2.1 mg/dL (ref 1.5–2.5)

## 2014-02-06 LAB — INSULIN, FASTING: Insulin fasting, serum: 8.3 u[IU]/mL (ref 2.0–19.6)

## 2014-02-06 LAB — VITAMIN D 25 HYDROXY (VIT D DEFICIENCY, FRACTURES): Vit D, 25-Hydroxy: 90 ng/mL — ABNORMAL HIGH (ref 30–89)

## 2014-02-08 ENCOUNTER — Telehealth: Payer: Self-pay | Admitting: *Deleted

## 2014-02-08 ENCOUNTER — Ambulatory Visit
Admission: RE | Admit: 2014-02-08 | Discharge: 2014-02-08 | Disposition: A | Discharge status: Home / Self Care | Payer: Medicare Other | Source: Ambulatory Visit | Attending: Internal Medicine | Admitting: Internal Medicine

## 2014-02-08 ENCOUNTER — Other Ambulatory Visit: Payer: Self-pay | Admitting: Internal Medicine

## 2014-02-08 DIAGNOSIS — I679 Cerebrovascular disease, unspecified: Secondary | ICD-10-CM

## 2014-02-08 DIAGNOSIS — F05 Delirium due to known physiological condition: Secondary | ICD-10-CM

## 2014-02-08 DIAGNOSIS — R42 Dizziness and giddiness: Secondary | ICD-10-CM

## 2014-02-08 MED ORDER — PRAVASTATIN SODIUM 40 MG PO TABS: 40.0000 mg | ORAL_TABLET | Freq: Every day | ORAL | Status: DC

## 2014-02-08 NOTE — Telephone Encounter (Signed)
Patient aware of CT Head result.  He has an appointment 02/19/2014 at [facility] at 1:45 PM for and MRI of head.

## 2014-02-19 ENCOUNTER — Ambulatory Visit
Admission: RE | Admit: 2014-02-19 | Discharge: 2014-02-19 | Disposition: A | Discharge status: Home / Self Care | Payer: Medicare Other | Source: Ambulatory Visit | Attending: Internal Medicine | Admitting: Internal Medicine

## 2014-02-19 DIAGNOSIS — F05 Delirium due to known physiological condition: Secondary | ICD-10-CM

## 2014-02-19 DIAGNOSIS — R42 Dizziness and giddiness: Secondary | ICD-10-CM

## 2014-02-19 MED ORDER — GADOBENATE DIMEGLUMINE 529 MG/ML IV SOLN
20.0000 mL | Freq: Once | INTRAVENOUS | Status: AC | PRN
  Administered 2014-02-19: 20 mL via INTRAVENOUS

## 2014-02-26 ENCOUNTER — Ambulatory Visit: Payer: Self-pay | Admitting: Internal Medicine

## 2014-02-28 ENCOUNTER — Other Ambulatory Visit: Payer: Self-pay | Admitting: Neurosurgery

## 2014-02-28 ENCOUNTER — Other Ambulatory Visit: Payer: Self-pay | Admitting: Internal Medicine

## 2014-03-06 ENCOUNTER — Other Ambulatory Visit (HOSPITAL_COMMUNITY): Payer: Self-pay | Admitting: Neurosurgery

## 2014-03-06 DIAGNOSIS — D429 Neoplasm of uncertain behavior of meninges, unspecified: Secondary | ICD-10-CM

## 2014-03-08 DIAGNOSIS — C801 Malignant (primary) neoplasm, unspecified: Secondary | ICD-10-CM

## 2014-03-08 HISTORY — DX: Malignant (primary) neoplasm, unspecified: C80.1

## 2014-03-21 ENCOUNTER — Ambulatory Visit (HOSPITAL_COMMUNITY)
Admission: RE | Admit: 2014-03-21 | Discharge: 2014-03-21 | Disposition: A | Discharge status: Home / Self Care | Payer: Medicare Other | Source: Ambulatory Visit | Attending: Neurosurgery | Admitting: Neurosurgery

## 2014-03-21 DIAGNOSIS — R2681 Unsteadiness on feet: Secondary | ICD-10-CM | POA: Insufficient documentation

## 2014-03-21 DIAGNOSIS — D429 Neoplasm of uncertain behavior of meninges, unspecified: Secondary | ICD-10-CM | POA: Insufficient documentation

## 2014-03-21 LAB — CREATININE, SERUM
Creatinine, Ser: 0.94 mg/dL (ref 0.50–1.35)
GFR calc non Af Amer: 85 mL/min — ABNORMAL LOW (ref >90)

## 2014-03-21 MED ORDER — GADOBENATE DIMEGLUMINE 529 MG/ML IV SOLN
20.0000 mL | Freq: Once | INTRAVENOUS | Status: AC
  Administered 2014-03-21: 20 mL via INTRAVENOUS

## 2014-03-23 ENCOUNTER — Encounter (HOSPITAL_COMMUNITY): Payer: Self-pay

## 2014-03-27 ENCOUNTER — Other Ambulatory Visit: Payer: Self-pay | Admitting: Internal Medicine

## 2014-03-29 ENCOUNTER — Encounter (HOSPITAL_COMMUNITY)
Admission: RE | Admit: 2014-03-29 | Discharge: 2014-03-29 | Disposition: A | Discharge status: Home / Self Care | Payer: Medicare Other | Source: Ambulatory Visit | Attending: Neurosurgery | Admitting: Neurosurgery

## 2014-03-29 ENCOUNTER — Encounter (HOSPITAL_COMMUNITY)
Admission: RE | Admit: 2014-03-29 | Discharge: 2014-03-29 | Disposition: A | Discharge status: Home / Self Care | Payer: Medicare Other | Source: Ambulatory Visit | Attending: Anesthesiology | Admitting: Anesthesiology

## 2014-03-29 ENCOUNTER — Encounter (HOSPITAL_COMMUNITY): Payer: Self-pay

## 2014-03-29 DIAGNOSIS — C7931 Secondary malignant neoplasm of brain: Secondary | ICD-10-CM | POA: Insufficient documentation

## 2014-03-29 DIAGNOSIS — Z01818 Encounter for other preprocedural examination: Secondary | ICD-10-CM | POA: Diagnosis present

## 2014-03-29 DIAGNOSIS — I10 Essential (primary) hypertension: Secondary | ICD-10-CM | POA: Insufficient documentation

## 2014-03-29 DIAGNOSIS — D320 Benign neoplasm of cerebral meninges: Secondary | ICD-10-CM | POA: Insufficient documentation

## 2014-03-29 HISTORY — DX: Other specified postprocedural states: Z98.890

## 2014-03-29 HISTORY — DX: Adverse effect of unspecified anesthetic, initial encounter: T41.45XA

## 2014-03-29 HISTORY — DX: Other complications of anesthesia, initial encounter: T88.59XA

## 2014-03-29 HISTORY — DX: Other specified postprocedural states: R11.2

## 2014-03-29 LAB — CBC
HCT: 45.7 % (ref 39.0–52.0)
Hemoglobin: 15.3 g/dL (ref 13.0–17.0)
MCH: 30.5 pg (ref 26.0–34.0)
MCHC: 33.5 g/dL (ref 30.0–36.0)
MCV: 91 fL (ref 78.0–100.0)
Platelets: 263 10*3/uL (ref 150–400)
RBC: 5.02 MIL/uL (ref 4.22–5.81)
RDW: 13 % (ref 11.5–15.5)
WBC: 12.6 10*3/uL — ABNORMAL HIGH (ref 4.0–10.5)

## 2014-03-29 LAB — BASIC METABOLIC PANEL WITH GFR
Anion gap: 13 (ref 5–15)
BUN: 21 mg/dL (ref 6–23)
CO2: 24 meq/L (ref 19–32)
Calcium: 9.2 mg/dL (ref 8.4–10.5)
Chloride: 102 meq/L (ref 96–112)
Creatinine, Ser: 0.85 mg/dL (ref 0.50–1.35)
GFR calc Af Amer: >90 mL/min
GFR calc non Af Amer: 89 mL/min — ABNORMAL LOW
Glucose, Bld: 214 mg/dL — ABNORMAL HIGH (ref 70–99)
Potassium: 4.1 meq/L (ref 3.7–5.3)
Sodium: 139 meq/L (ref 137–147)

## 2014-03-29 LAB — ABO/RH: ABO/RH(D): A POS

## 2014-03-29 NOTE — Progress Notes (Signed)
03/29/14 0920  OBSTRUCTIVE SLEEP APNEA  Have you ever been diagnosed with sleep apnea through a sleep study? No  Do you snore loudly (loud enough to be heard through closed doors)?  1 (Doesnt snore every night)  Do you often feel tired, fatigued, or sleepy during the daytime? 1  Has anyone observed you stop breathing during your sleep? 0  Do you have, or are you being treated for high blood pressure? 1  BMI more than 35 kg/m2? 0  Age over 67 years old? 1  Neck circumference greater than 40 cm/16 inches? 0  Gender: 1  Obstructive Sleep Apnea Score 5  Score 4 or greater  Results sent to PCP   This patient has screened at risk for sleep apnea using the STOP bang tool used during a pre-surgical visit. A score of 4 or greater is at risk for sleep apnea.

## 2014-03-29 NOTE — Progress Notes (Signed)
Patient denied having any acute cardiac or pulmonary issues. PCP is Unk Pinto. Wife at chair side during PAT visit. Sleep apnea results sent to PCP.

## 2014-03-29 NOTE — Pre-Procedure Instructions (Signed)
Ivan Stark  03/29/2014   Your procedure is scheduled on:  Friday April 06, 2014 at 7:30 AM.  Report to [facility] Admitting at 5:30 AM.  Call this number if you have problems the morning of surgery: [phone]   Remember:   Do not eat food or drink liquids after midnight.   Take these medicines the morning of surgery with A SIP OF WATER: NONE   Discontinue aspirin and herbal medications 7 days prior to surgery   Do NOT take any diabetic medications the morning of your surgery (Ex. Amaryl & Metformin)   Do not wear jewelry.  Do not wear lotions, powders, or cologne.   Men may shave face and neck.  Do not bring valuables to the hospital.  [facility] is not responsible for any belongings or valuables.               Contacts, dentures or bridgework may not be worn into surgery.  Leave suitcase in the car. After surgery it may be brought to your room.  For patients admitted to the hospital, discharge time is determined by your treatment team.               Patients discharged the day of surgery will not be allowed to drive home.  Name and phone number of your driver: Family/Friend  Special Instructions: Shower using CHG soap the night before and the morning of your surgery   Please read over the following fact sheets that you were given: Pain Booklet, Coughing and Deep Breathing and Surgical Site Infection Prevention

## 2014-04-06 ENCOUNTER — Inpatient Hospital Stay (HOSPITAL_COMMUNITY): Payer: Medicare Other

## 2014-04-06 ENCOUNTER — Inpatient Hospital Stay (HOSPITAL_COMMUNITY)
Admission: RE | Admit: 2014-04-06 | Discharge: 2014-04-11 | DRG: 025 | Disposition: A | Discharge status: Rehabilitation Facility | Payer: Medicare Other | Source: Ambulatory Visit | Attending: Neurosurgery | Admitting: Neurosurgery

## 2014-04-06 ENCOUNTER — Encounter (HOSPITAL_COMMUNITY)
Admission: RE | Disposition: A | Discharge status: Rehabilitation Facility | Payer: Self-pay | Source: Ambulatory Visit | Attending: Neurosurgery

## 2014-04-06 ENCOUNTER — Inpatient Hospital Stay (HOSPITAL_COMMUNITY): Payer: Medicare Other | Admitting: Certified Registered Nurse Anesthetist

## 2014-04-06 ENCOUNTER — Encounter (HOSPITAL_COMMUNITY): Payer: Medicare Other | Admitting: Certified Registered Nurse Anesthetist

## 2014-04-06 DIAGNOSIS — G8194 Hemiplegia, unspecified affecting left nondominant side: Secondary | ICD-10-CM | POA: Diagnosis not present

## 2014-04-06 DIAGNOSIS — D496 Neoplasm of unspecified behavior of brain: Secondary | ICD-10-CM | POA: Diagnosis present

## 2014-04-06 DIAGNOSIS — Z7982 Long term (current) use of aspirin: Secondary | ICD-10-CM

## 2014-04-06 DIAGNOSIS — E119 Type 2 diabetes mellitus without complications: Secondary | ICD-10-CM | POA: Diagnosis present

## 2014-04-06 DIAGNOSIS — I10 Essential (primary) hypertension: Secondary | ICD-10-CM | POA: Diagnosis present

## 2014-04-06 DIAGNOSIS — E785 Hyperlipidemia, unspecified: Secondary | ICD-10-CM | POA: Diagnosis present

## 2014-04-06 DIAGNOSIS — D320 Benign neoplasm of cerebral meninges: Secondary | ICD-10-CM | POA: Diagnosis present

## 2014-04-06 DIAGNOSIS — C7931 Secondary malignant neoplasm of brain: Secondary | ICD-10-CM | POA: Diagnosis present

## 2014-04-06 DIAGNOSIS — G936 Cerebral edema: Secondary | ICD-10-CM | POA: Diagnosis present

## 2014-04-06 DIAGNOSIS — K219 Gastro-esophageal reflux disease without esophagitis: Secondary | ICD-10-CM | POA: Diagnosis present

## 2014-04-06 DIAGNOSIS — Z452 Encounter for adjustment and management of vascular access device: Secondary | ICD-10-CM

## 2014-04-06 HISTORY — PX: CRANIOTOMY: SHX93

## 2014-04-06 LAB — GLUCOSE, CAPILLARY
GLUCOSE-CAPILLARY: 261 mg/dL — AB (ref 70–99)
GLUCOSE-CAPILLARY: 231 mg/dL — AB (ref 70–99)
Glucose-Capillary: 187 mg/dL — ABNORMAL HIGH (ref 70–99)
Glucose-Capillary: 267 mg/dL — ABNORMAL HIGH (ref 70–99)
Glucose-Capillary: 245 mg/dL — ABNORMAL HIGH (ref 70–99)

## 2014-04-06 LAB — CBC
HEMATOCRIT: 34 % — AB (ref 39.0–52.0)
Hemoglobin: 11.4 g/dL — ABNORMAL LOW (ref 13.0–17.0)
MCH: 30.4 pg (ref 26.0–34.0)
MCHC: 33.5 g/dL (ref 30.0–36.0)
MCV: 90.7 fL (ref 78.0–100.0)
Platelets: 188 10*3/uL (ref 150–400)
RBC: 3.75 MIL/uL — ABNORMAL LOW (ref 4.22–5.81)
RDW: 13.3 % (ref 11.5–15.5)
WBC: 13.9 10*3/uL — ABNORMAL HIGH (ref 4.0–10.5)

## 2014-04-06 LAB — POCT I-STAT 4, (NA,K, GLUC, HGB,HCT)
Glucose, Bld: 194 mg/dL — ABNORMAL HIGH (ref 70–99)
Glucose, Bld: 261 mg/dL — ABNORMAL HIGH (ref 70–99)
HCT: 35 % — ABNORMAL LOW (ref 39.0–52.0)
HEMATOCRIT: 41 % (ref 39.0–52.0)
HEMOGLOBIN: 13.9 g/dL (ref 13.0–17.0)
Hemoglobin: 11.9 g/dL — ABNORMAL LOW (ref 13.0–17.0)
POTASSIUM: 4.2 meq/L (ref 3.7–5.3)
Potassium: 4.2 mEq/L (ref 3.7–5.3)
SODIUM: 141 meq/L (ref 137–147)
Sodium: 137 mEq/L (ref 137–147)

## 2014-04-06 LAB — POCT I-STAT 7, (LYTES, BLD GAS, ICA,H+H)
Acid-base deficit: 3 mmol/L — ABNORMAL HIGH (ref 0.0–2.0)
Acid-base deficit: 3 mmol/L — ABNORMAL HIGH (ref 0.0–2.0)
Acid-base deficit: 4 mmol/L — ABNORMAL HIGH (ref 0.0–2.0)
Bicarbonate: 22.7 mEq/L (ref 20.0–24.0)
Bicarbonate: 23.3 mEq/L (ref 20.0–24.0)
Bicarbonate: 21.2 mEq/L (ref 20.0–24.0)
CALCIUM ION: 1.16 mmol/L (ref 1.13–1.30)
CALCIUM ION: 1.2 mmol/L (ref 1.13–1.30)
Calcium, Ion: 1.12 mmol/L — ABNORMAL LOW (ref 1.13–1.30)
HCT: 34 % — ABNORMAL LOW (ref 39.0–52.0)
HEMATOCRIT: 39 % (ref 39.0–52.0)
HEMATOCRIT: 36 % — AB (ref 39.0–52.0)
HEMOGLOBIN: 13.3 g/dL (ref 13.0–17.0)
HEMOGLOBIN: 11.6 g/dL — AB (ref 13.0–17.0)
Hemoglobin: 12.2 g/dL — ABNORMAL LOW (ref 13.0–17.0)
O2 SAT: 99 %
O2 Saturation: 99 %
O2 Saturation: 99 %
PCO2 ART: 41.7 mmHg (ref 35.0–45.0)
PCO2 ART: 39.7 mmHg (ref 35.0–45.0)
PH ART: 7.339 — AB (ref 7.350–7.450)
PH ART: 7.339 — AB (ref 7.350–7.450)
PO2 ART: 125 mmHg — AB (ref 80.0–100.0)
Patient temperature: 36.6
Potassium: 4.4 mEq/L (ref 3.7–5.3)
Potassium: 4.6 mEq/L (ref 3.7–5.3)
Potassium: 4.2 mEq/L (ref 3.7–5.3)
SODIUM: 138 meq/L (ref 137–147)
Sodium: 135 mEq/L — ABNORMAL LOW (ref 137–147)
Sodium: 141 mEq/L (ref 137–147)
TCO2: 24 mmol/L (ref 0–100)
TCO2: 25 mmol/L (ref 0–100)
TCO2: 22 mmol/L (ref 0–100)
pCO2 arterial: 42.4 mmHg (ref 35.0–45.0)
pH, Arterial: 7.346 — ABNORMAL LOW (ref 7.350–7.450)
pO2, Arterial: 141 mmHg — ABNORMAL HIGH (ref 80.0–100.0)
pO2, Arterial: 146 mmHg — ABNORMAL HIGH (ref 80.0–100.0)

## 2014-04-06 LAB — MRSA PCR SCREENING: MRSA by PCR: NEGATIVE

## 2014-04-06 LAB — PREPARE RBC (CROSSMATCH)

## 2014-04-06 LAB — CREATININE, SERUM
Creatinine, Ser: 0.86 mg/dL (ref 0.50–1.35)
GFR calc Af Amer: >90 mL/min (ref >90)
GFR calc non Af Amer: 88 mL/min — ABNORMAL LOW (ref >90)

## 2014-04-06 LAB — POCT I-STAT GLUCOSE
Glucose, Bld: 233 mg/dL — ABNORMAL HIGH (ref 70–99)
Operator id: 190282

## 2014-04-06 SURGERY — CRANIOTOMY TUMOR EXCISION
Anesthesia: General | Site: Head | Laterality: Bilateral

## 2014-04-06 MED ORDER — CEFAZOLIN SODIUM-DEXTROSE 2-3 GM-% IV SOLR
INTRAVENOUS | Status: AC
  Administered 2014-04-06 (×2): 2 g via INTRAVENOUS
  Filled 2014-04-06: qty 50

## 2014-04-06 MED ORDER — SODIUM CHLORIDE 0.9 % IV SOLN
250.0000 [IU] | INTRAVENOUS | Status: DC | PRN
  Administered 2014-04-06: 6 [IU]/h via INTRAVENOUS

## 2014-04-06 MED ORDER — NEOSTIGMINE METHYLSULFATE 10 MG/10ML IV SOLN
INTRAVENOUS | Status: DC | PRN
  Administered 2014-04-06: 5 mg via INTRAVENOUS

## 2014-04-06 MED ORDER — LABETALOL HCL 5 MG/ML IV SOLN: 10.0000 mg | INTRAVENOUS | Status: DC | PRN

## 2014-04-06 MED ORDER — EPHEDRINE SULFATE 50 MG/ML IJ SOLN
INTRAMUSCULAR | Status: AC
  Filled 2014-04-06: qty 1

## 2014-04-06 MED ORDER — HEPARIN SODIUM (PORCINE) 5000 UNIT/ML IJ SOLN
5000.0000 [IU] | Freq: Three times a day (TID) | INTRAMUSCULAR | Status: DC
  Administered 2014-04-06 – 2014-04-07 (×2): 5000 [IU] via SUBCUTANEOUS
  Filled 2014-04-06 (×5): qty 1

## 2014-04-06 MED ORDER — ARTIFICIAL TEARS OP OINT
TOPICAL_OINTMENT | OPHTHALMIC | Status: AC
  Filled 2014-04-06: qty 3.5

## 2014-04-06 MED ORDER — PRAVASTATIN SODIUM 40 MG PO TABS
40.0000 mg | ORAL_TABLET | Freq: Every day | ORAL | Status: DC
  Administered 2014-04-06 – 2014-04-10 (×4): 40 mg via ORAL
  Filled 2014-04-06 (×6): qty 1

## 2014-04-06 MED ORDER — HYDROMORPHONE HCL 1 MG/ML IJ SOLN: 0.2500 mg | INTRAMUSCULAR | Status: DC | PRN

## 2014-04-06 MED ORDER — LIDOCAINE HCL (CARDIAC) 20 MG/ML IV SOLN
INTRAVENOUS | Status: AC
  Filled 2014-04-06: qty 10

## 2014-04-06 MED ORDER — GLIMEPIRIDE 4 MG PO TABS
2.0000 mg | ORAL_TABLET | Freq: Two times a day (BID) | ORAL | Status: DC
  Administered 2014-04-06 – 2014-04-11 (×9): 2 mg via ORAL
  Filled 2014-04-06 (×12): qty 1

## 2014-04-06 MED ORDER — LEVETIRACETAM IN NACL 1000 MG/100ML IV SOLN
1000.0000 mg | INTRAVENOUS | Status: DC
  Filled 2014-04-06: qty 100

## 2014-04-06 MED ORDER — LEVETIRACETAM IN NACL 500 MG/100ML IV SOLN
500.0000 mg | Freq: Two times a day (BID) | INTRAVENOUS | Status: DC
  Administered 2014-04-06 – 2014-04-10 (×8): 500 mg via INTRAVENOUS
  Filled 2014-04-06 (×9): qty 100

## 2014-04-06 MED ORDER — ALBUMIN HUMAN 5 % IV SOLN
INTRAVENOUS | Status: DC | PRN
  Administered 2014-04-06 (×4): via INTRAVENOUS

## 2014-04-06 MED ORDER — DEXAMETHASONE SODIUM PHOSPHATE 10 MG/ML IJ SOLN
INTRAMUSCULAR | Status: DC | PRN
  Administered 2014-04-06: 10 mg via INTRAVENOUS

## 2014-04-06 MED ORDER — SENNA 8.6 MG PO TABS
1.0000 | ORAL_TABLET | Freq: Two times a day (BID) | ORAL | Status: DC
  Administered 2014-04-06 – 2014-04-11 (×10): 8.6 mg via ORAL
  Filled 2014-04-06 (×10): qty 1

## 2014-04-06 MED ORDER — ARTIFICIAL TEARS OP OINT
TOPICAL_OINTMENT | OPHTHALMIC | Status: DC | PRN
  Administered 2014-04-06: 1 via OPHTHALMIC

## 2014-04-06 MED ORDER — PROMETHAZINE HCL 25 MG PO TABS: 12.5000 mg | ORAL_TABLET | ORAL | Status: DC | PRN

## 2014-04-06 MED ORDER — SODIUM CHLORIDE 0.9 % IJ SOLN
INTRAMUSCULAR | Status: AC
  Filled 2014-04-06: qty 10

## 2014-04-06 MED ORDER — ENALAPRIL MALEATE 20 MG PO TABS
20.0000 mg | ORAL_TABLET | Freq: Every day | ORAL | Status: DC
  Administered 2014-04-06 – 2014-04-11 (×6): 20 mg via ORAL
  Filled 2014-04-06 (×6): qty 1

## 2014-04-06 MED ORDER — EPHEDRINE SULFATE 50 MG/ML IJ SOLN
INTRAMUSCULAR | Status: DC | PRN
  Administered 2014-04-06: 10 mg via INTRAVENOUS
  Administered 2014-04-06: 5 mg via INTRAVENOUS

## 2014-04-06 MED ORDER — DEXAMETHASONE SODIUM PHOSPHATE 4 MG/ML IJ SOLN
4.0000 mg | Freq: Four times a day (QID) | INTRAMUSCULAR | Status: DC
  Administered 2014-04-06 – 2014-04-10 (×14): 4 mg via INTRAVENOUS
  Filled 2014-04-06 (×18): qty 1

## 2014-04-06 MED ORDER — PROPOFOL 10 MG/ML IV BOLUS
INTRAVENOUS | Status: AC
  Filled 2014-04-06: qty 20

## 2014-04-06 MED ORDER — FENTANYL CITRATE 0.05 MG/ML IJ SOLN
INTRAMUSCULAR | Status: DC | PRN
  Administered 2014-04-06: 25 ug via INTRAVENOUS
  Administered 2014-04-06 (×3): 50 ug via INTRAVENOUS
  Administered 2014-04-06: 75 ug via INTRAVENOUS
  Administered 2014-04-06: 50 ug via INTRAVENOUS

## 2014-04-06 MED ORDER — GLYCOPYRROLATE 0.2 MG/ML IJ SOLN
INTRAMUSCULAR | Status: DC | PRN
  Administered 2014-04-06: .8 mg via INTRAVENOUS

## 2014-04-06 MED ORDER — SODIUM CHLORIDE 0.9 % IV SOLN
INTRAVENOUS | Status: DC | PRN
  Administered 2014-04-06: 07:00:00 via INTRAVENOUS

## 2014-04-06 MED ORDER — INSULIN ASPART 100 UNIT/ML ~~LOC~~ SOLN
0.0000 [IU] | Freq: Three times a day (TID) | SUBCUTANEOUS | Status: DC
  Administered 2014-04-06: 7 [IU] via SUBCUTANEOUS
  Administered 2014-04-07 (×2): 11 [IU] via SUBCUTANEOUS
  Administered 2014-04-08: 15 [IU] via SUBCUTANEOUS
  Administered 2014-04-08: 11 [IU] via SUBCUTANEOUS
  Administered 2014-04-09: 4 [IU] via SUBCUTANEOUS
  Administered 2014-04-09 – 2014-04-10 (×3): 7 [IU] via SUBCUTANEOUS
  Administered 2014-04-10: 18:00:00 via SUBCUTANEOUS
  Administered 2014-04-10: 7 [IU] via SUBCUTANEOUS

## 2014-04-06 MED ORDER — PHENYLEPHRINE HCL 10 MG/ML IJ SOLN
INTRAMUSCULAR | Status: DC | PRN
  Administered 2014-04-06: 40 ug via INTRAVENOUS
  Administered 2014-04-06 (×2): 80 ug via INTRAVENOUS

## 2014-04-06 MED ORDER — SODIUM CHLORIDE 0.9 % IV SOLN
1000.0000 mg | INTRAVENOUS | Status: DC | PRN
  Administered 2014-04-06: 1000 mg via INTRAVENOUS

## 2014-04-06 MED ORDER — 0.9 % SODIUM CHLORIDE (POUR BTL) OPTIME
TOPICAL | Status: DC | PRN
  Administered 2014-04-06 (×4): 1000 mL

## 2014-04-06 MED ORDER — ROCURONIUM BROMIDE 100 MG/10ML IV SOLN
INTRAVENOUS | Status: DC | PRN
  Administered 2014-04-06 (×2): 10 mg via INTRAVENOUS
  Administered 2014-04-06: 5 mg via INTRAVENOUS
  Administered 2014-04-06 (×2): 10 mg via INTRAVENOUS
  Administered 2014-04-06: 50 mg via INTRAVENOUS
  Administered 2014-04-06: 5 mg via INTRAVENOUS
  Administered 2014-04-06: 10 mg via INTRAVENOUS

## 2014-04-06 MED ORDER — PHENYLEPHRINE 40 MCG/ML (10ML) SYRINGE FOR IV PUSH (FOR BLOOD PRESSURE SUPPORT)
PREFILLED_SYRINGE | INTRAVENOUS | Status: AC
  Filled 2014-04-06: qty 10

## 2014-04-06 MED ORDER — SCOPOLAMINE 1 MG/3DAYS TD PT72
MEDICATED_PATCH | TRANSDERMAL | Status: AC
  Administered 2014-04-06: 1 via TRANSDERMAL
  Filled 2014-04-06: qty 1

## 2014-04-06 MED ORDER — ROCURONIUM BROMIDE 50 MG/5ML IV SOLN
INTRAVENOUS | Status: AC
  Filled 2014-04-06: qty 2

## 2014-04-06 MED ORDER — SODIUM CHLORIDE 0.9 % IV SOLN
INTRAVENOUS | Status: DC | PRN
  Administered 2014-04-06 (×3): via INTRAVENOUS

## 2014-04-06 MED ORDER — ROCURONIUM BROMIDE 50 MG/5ML IV SOLN
INTRAVENOUS | Status: AC
  Filled 2014-04-06: qty 1

## 2014-04-06 MED ORDER — CEFAZOLIN SODIUM-DEXTROSE 2-3 GM-% IV SOLR
2.0000 g | Freq: Three times a day (TID) | INTRAVENOUS | Status: AC
  Administered 2014-04-06 – 2014-04-07 (×2): 2 g via INTRAVENOUS
  Filled 2014-04-06 (×2): qty 50

## 2014-04-06 MED ORDER — DIPHENHYDRAMINE HCL 50 MG/ML IJ SOLN
INTRAMUSCULAR | Status: DC | PRN
  Administered 2014-04-06: 6.25 mg via INTRAVENOUS

## 2014-04-06 MED ORDER — FENTANYL CITRATE 0.05 MG/ML IJ SOLN
INTRAMUSCULAR | Status: AC
  Filled 2014-04-06: qty 5

## 2014-04-06 MED ORDER — METFORMIN HCL ER 500 MG PO TB24
500.0000 mg | ORAL_TABLET | Freq: Two times a day (BID) | ORAL | Status: DC
  Administered 2014-04-07 – 2014-04-11 (×10): 500 mg via ORAL
  Filled 2014-04-06 (×11): qty 1

## 2014-04-06 MED ORDER — MIDAZOLAM HCL 5 MG/5ML IJ SOLN
INTRAMUSCULAR | Status: DC | PRN
  Administered 2014-04-06: 1 mg via INTRAVENOUS
  Administered 2014-04-06 (×2): .5 mg via INTRAVENOUS

## 2014-04-06 MED ORDER — SODIUM CHLORIDE 0.9 % IR SOLN
Status: DC | PRN
  Administered 2014-04-06: 09:00:00

## 2014-04-06 MED ORDER — ONDANSETRON HCL 4 MG/2ML IJ SOLN
INTRAMUSCULAR | Status: AC
  Filled 2014-04-06: qty 2

## 2014-04-06 MED ORDER — DEXAMETHASONE SODIUM PHOSPHATE 10 MG/ML IJ SOLN
INTRAMUSCULAR | Status: AC
  Filled 2014-04-06: qty 1

## 2014-04-06 MED ORDER — MORPHINE SULFATE 2 MG/ML IJ SOLN: 1.0000 mg | INTRAMUSCULAR | Status: DC | PRN

## 2014-04-06 MED ORDER — MANNITOL 25 % IV SOLN
INTRAVENOUS | Status: AC
  Administered 2014-04-06: 25 g via INTRAVENOUS
  Filled 2014-04-06: qty 100

## 2014-04-06 MED ORDER — HEMOSTATIC AGENTS (NO CHARGE) OPTIME
TOPICAL | Status: DC | PRN
  Administered 2014-04-06: 1 via TOPICAL

## 2014-04-06 MED ORDER — METFORMIN HCL ER 500 MG PO TB24: 500.0000 mg | ORAL_TABLET | Freq: Three times a day (TID) | ORAL | Status: DC

## 2014-04-06 MED ORDER — SODIUM CHLORIDE 0.9 % IV SOLN
INTRAVENOUS | Status: DC
  Filled 2014-04-06: qty 2.5

## 2014-04-06 MED ORDER — ONDANSETRON HCL 4 MG PO TABS: 4.0000 mg | ORAL_TABLET | ORAL | Status: DC | PRN

## 2014-04-06 MED ORDER — GLIMEPIRIDE 4 MG PO TABS: 4.0000 mg | ORAL_TABLET | Freq: Every day | ORAL | Status: DC

## 2014-04-06 MED ORDER — BACITRACIN ZINC 500 UNIT/GM EX OINT
TOPICAL_OINTMENT | CUTANEOUS | Status: DC | PRN
  Administered 2014-04-06: 1 via TOPICAL

## 2014-04-06 MED ORDER — THROMBIN 5000 UNITS EX SOLR
CUTANEOUS | Status: DC | PRN
  Administered 2014-04-06 (×5): via TOPICAL

## 2014-04-06 MED ORDER — THROMBIN 20000 UNITS EX SOLR
CUTANEOUS | Status: DC | PRN
  Administered 2014-04-06 (×3): via TOPICAL

## 2014-04-06 MED ORDER — HYDROCODONE-ACETAMINOPHEN 5-325 MG PO TABS
1.0000 | ORAL_TABLET | ORAL | Status: DC | PRN
  Administered 2014-04-06 – 2014-04-10 (×5): 1 via ORAL
  Filled 2014-04-06 (×6): qty 1

## 2014-04-06 MED ORDER — ONDANSETRON HCL 4 MG/2ML IJ SOLN: 4.0000 mg | INTRAMUSCULAR | Status: DC | PRN

## 2014-04-06 MED ORDER — PANTOPRAZOLE SODIUM 40 MG IV SOLR
40.0000 mg | Freq: Every day | INTRAVENOUS | Status: DC
  Administered 2014-04-06 – 2014-04-07 (×2): 40 mg via INTRAVENOUS
  Filled 2014-04-06 (×3): qty 40

## 2014-04-06 MED ORDER — DOCUSATE SODIUM 100 MG PO CAPS
100.0000 mg | ORAL_CAPSULE | Freq: Two times a day (BID) | ORAL | Status: DC
  Administered 2014-04-06 – 2014-04-11 (×10): 100 mg via ORAL
  Filled 2014-04-06 (×11): qty 1

## 2014-04-06 MED ORDER — BUPIVACAINE HCL (PF) 0.5 % IJ SOLN
INTRAMUSCULAR | Status: DC | PRN
  Administered 2014-04-06: 5 mL

## 2014-04-06 MED ORDER — LIDOCAINE-EPINEPHRINE 1 %-1:100000 IJ SOLN
INTRAMUSCULAR | Status: DC | PRN
  Administered 2014-04-06: 5 mL

## 2014-04-06 MED ORDER — METFORMIN HCL ER 500 MG PO TB24
1000.0000 mg | ORAL_TABLET | Freq: Every day | ORAL | Status: DC
  Administered 2014-04-06 – 2014-04-10 (×4): 1000 mg via ORAL
  Filled 2014-04-06 (×6): qty 2

## 2014-04-06 MED ORDER — SUCCINYLCHOLINE CHLORIDE 20 MG/ML IJ SOLN
INTRAMUSCULAR | Status: AC
  Filled 2014-04-06: qty 1

## 2014-04-06 MED ORDER — ADULT MULTIVITAMIN W/MINERALS CH
1.0000 | ORAL_TABLET | Freq: Every day | ORAL | Status: DC
  Administered 2014-04-07 – 2014-04-11 (×5): 1 via ORAL
  Filled 2014-04-06 (×5): qty 1

## 2014-04-06 MED ORDER — SODIUM CHLORIDE 0.9 % IV SOLN
INTRAVENOUS | Status: DC
  Administered 2014-04-06 – 2014-04-09 (×3): via INTRAVENOUS

## 2014-04-06 MED ORDER — PROMETHAZINE HCL 25 MG/ML IJ SOLN: 6.2500 mg | INTRAMUSCULAR | Status: DC | PRN

## 2014-04-06 MED ORDER — MICROFIBRILLAR COLL HEMOSTAT EX PADS
MEDICATED_PAD | CUTANEOUS | Status: DC | PRN
  Administered 2014-04-06: 1 via TOPICAL

## 2014-04-06 MED ORDER — MIDAZOLAM HCL 2 MG/2ML IJ SOLN
INTRAMUSCULAR | Status: AC
  Filled 2014-04-06: qty 2

## 2014-04-06 MED ORDER — DEXTROSE 5 % IV SOLN
10.0000 mg | INTRAVENOUS | Status: DC | PRN
  Administered 2014-04-06: 15 ug/min via INTRAVENOUS

## 2014-04-06 MED ORDER — ONDANSETRON HCL 4 MG/2ML IJ SOLN
INTRAMUSCULAR | Status: DC | PRN
  Administered 2014-04-06: 4 mg via INTRAVENOUS

## 2014-04-06 MED ORDER — PROPOFOL 10 MG/ML IV BOLUS
INTRAVENOUS | Status: DC | PRN
  Administered 2014-04-06: 30 mg via INTRAVENOUS
  Administered 2014-04-06: 180 mg via INTRAVENOUS

## 2014-04-06 SURGICAL SUPPLY — 112 items
APL SKNCLS STERI-STRIP NONHPOA (GAUZE/BANDAGES/DRESSINGS)
BENZOIN TINCTURE PRP APPL 2/3 (GAUZE/BANDAGES/DRESSINGS) IMPLANT
BLADE CLIPPER SURG (BLADE) ×3 IMPLANT
BLADE SAW GIGLI 16 STRL (MISCELLANEOUS) IMPLANT
BLADE SURG 15 STRL LF DISP TIS (BLADE) IMPLANT
BLADE SURG 15 STRL SS (BLADE)
BLADE ULTRA TIP 2M (BLADE) ×3 IMPLANT
BNDG GAUZE ELAST 4 BULKY (GAUZE/BANDAGES/DRESSINGS) IMPLANT
BRUSH SCRUB EZ 1% IODOPHOR (MISCELLANEOUS) ×3 IMPLANT
BUR ACORN 6.0 PRECISION (BURR) ×2 IMPLANT
BUR ACORN 6.0MM PRECISION (BURR) ×1
BUR ADDG 1.1 (BURR) IMPLANT
BUR ADDG 1.1MM (BURR)
BUR MATCHSTICK NEURO 3.0 LAGG (BURR) IMPLANT
BUR ROUND FLUTED 4 SOFT TCH (BURR) IMPLANT
BUR ROUND FLUTED 4MM SOFT TCH (BURR)
BUR ROUND FLUTED 5 RND (BURR) ×1 IMPLANT
BUR ROUND FLUTED 5MM RND (BURR) ×1
BUR ROUTER D-58 CRANI (BURR) IMPLANT
CANISTER SUCT 3000ML (MISCELLANEOUS) ×3 IMPLANT
CATH VENTRIC 35X38 W/TROCAR LG (CATHETERS) IMPLANT
CLIP TI MEDIUM 6 (CLIP) IMPLANT
CONT SPEC 4OZ CLIKSEAL STRL BL (MISCELLANEOUS) ×6 IMPLANT
COVER MAYO STAND STRL (DRAPES) IMPLANT
DECANTER SPIKE VIAL GLASS SM (MISCELLANEOUS) ×3 IMPLANT
DRAIN SNY WOU 7FLT (WOUND CARE) IMPLANT
DRAIN SUBARACHNOID (WOUND CARE) IMPLANT
DRAPE MICROSCOPE LEICA (MISCELLANEOUS) IMPLANT
DRAPE NEUROLOGICAL W/INCISE (DRAPES) ×3 IMPLANT
DRAPE PROXIMA HALF (DRAPES) ×3 IMPLANT
DRAPE STERI IOBAN 125X83 (DRAPES) IMPLANT
DRAPE SURG 17X23 STRL (DRAPES) IMPLANT
DRAPE WARM FLUID 44X44 (DRAPE) ×3 IMPLANT
DRSG TELFA 3X8 NADH (GAUZE/BANDAGES/DRESSINGS) ×3 IMPLANT
DURAPREP 6ML APPLICATOR 50/CS (WOUND CARE) ×3 IMPLANT
ELECT CAUTERY BLADE 6.4 (BLADE) ×3 IMPLANT
ELECT REM PT RETURN 9FT ADLT (ELECTROSURGICAL) ×3
ELECTRODE REM PT RTRN 9FT ADLT (ELECTROSURGICAL) ×1 IMPLANT
EVACUATOR 1/8 PVC DRAIN (DRAIN) IMPLANT
EVACUATOR SILICONE 100CC (DRAIN) IMPLANT
FORCEPS BIPOLAR SPETZLER 8 1.0 (NEUROSURGERY SUPPLIES) ×2 IMPLANT
GAUZE SPONGE 4X4 12PLY STRL (GAUZE/BANDAGES/DRESSINGS) ×3 IMPLANT
GAUZE SPONGE 4X4 16PLY XRAY LF (GAUZE/BANDAGES/DRESSINGS) IMPLANT
GLOVE BIO SURGEON STRL SZ8 (GLOVE) ×4 IMPLANT
GLOVE BIOGEL PI IND STRL 7.0 (GLOVE) IMPLANT
GLOVE BIOGEL PI IND STRL 7.5 (GLOVE) IMPLANT
GLOVE BIOGEL PI INDICATOR 7.0 (GLOVE) ×4
GLOVE BIOGEL PI INDICATOR 7.5 (GLOVE) ×2
GLOVE ECLIPSE 6.5 STRL STRAW (GLOVE) ×1 IMPLANT
GLOVE ECLIPSE 7.0 STRL STRAW (GLOVE) ×6 IMPLANT
GLOVE ECLIPSE 7.5 STRL STRAW (GLOVE) IMPLANT
GLOVE EXAM NITRILE LRG STRL (GLOVE) IMPLANT
GLOVE EXAM NITRILE MD LF STRL (GLOVE) IMPLANT
GLOVE EXAM NITRILE XL STR (GLOVE) IMPLANT
GLOVE EXAM NITRILE XS STR PU (GLOVE) IMPLANT
GLOVE INDICATOR 8.5 STRL (GLOVE) ×2 IMPLANT
GLOVE SS N UNI LF 7.0 STRL (GLOVE) ×10 IMPLANT
GOWN STRL REUS W/ TWL LRG LVL3 (GOWN DISPOSABLE) ×2 IMPLANT
GOWN STRL REUS W/ TWL XL LVL3 (GOWN DISPOSABLE) IMPLANT
GOWN STRL REUS W/TWL 2XL LVL3 (GOWN DISPOSABLE) IMPLANT
GOWN STRL REUS W/TWL LRG LVL3 (GOWN DISPOSABLE) ×12
GOWN STRL REUS W/TWL XL LVL3 (GOWN DISPOSABLE) ×3
HEMOSTAT POWDER KIT SURGIFOAM (HEMOSTASIS) ×3 IMPLANT
HEMOSTAT POWDER SURGIFOAM 1G (HEMOSTASIS) ×10 IMPLANT
HEMOSTAT SURGICEL 2X14 (HEMOSTASIS) IMPLANT
KIT BASIN OR (CUSTOM PROCEDURE TRAY) ×3 IMPLANT
KIT DRAIN CSF ACCUDRAIN (MISCELLANEOUS) IMPLANT
KIT ROOM TURNOVER OR (KITS) ×3 IMPLANT
KNIFE ARACHNOID DISP AM-24-S (MISCELLANEOUS) ×3 IMPLANT
MARKER SPHERE PSV REFLC THRD 3 (MARKER) ×6 IMPLANT
NDL HYPO 25X1 1.5 SAFETY (NEEDLE) ×1 IMPLANT
NDL SPNL 18GX3.5 QUINCKE PK (NEEDLE) IMPLANT
NEEDLE HYPO 25X1 1.5 SAFETY (NEEDLE) ×3 IMPLANT
NEEDLE SPNL 18GX3.5 QUINCKE PK (NEEDLE) IMPLANT
NS IRRIG 1000ML POUR BTL (IV SOLUTION) ×3 IMPLANT
PACK CRANIOTOMY (CUSTOM PROCEDURE TRAY) ×3 IMPLANT
PAD DRESSING TELFA 3X8 NADH (GAUZE/BANDAGES/DRESSINGS) ×1 IMPLANT
PAD EYE OVAL STERILE LF (GAUZE/BANDAGES/DRESSINGS) IMPLANT
PATTIES SURGICAL .25X.25 (GAUZE/BANDAGES/DRESSINGS) IMPLANT
PATTIES SURGICAL .5 X.5 (GAUZE/BANDAGES/DRESSINGS) ×6 IMPLANT
PATTIES SURGICAL .5 X3 (DISPOSABLE) ×2 IMPLANT
PATTIES SURGICAL 1/4 X 3 (GAUZE/BANDAGES/DRESSINGS) IMPLANT
PATTIES SURGICAL 1X1 (DISPOSABLE) IMPLANT
PLATE 1.5/0.5 22MM BURR HO (Plate) ×4 IMPLANT
PLATE DOUBLE 6 HOLE (Plate) ×4 IMPLANT
RUBBERBAND STERILE (MISCELLANEOUS) IMPLANT
SCREW SELF DRILL HT 1.5/4MM (Screw) ×32 IMPLANT
SET TUBING W/EXT DISP (INSTRUMENTS) ×2 IMPLANT
SLEEVE SURGEON STRL (DRAPES) ×2 IMPLANT
SPECIMEN JAR SMALL (MISCELLANEOUS) IMPLANT
SPONGE NEURO XRAY DETECT 1X3 (DISPOSABLE) ×2 IMPLANT
SPONGE SURGIFOAM ABS GEL 100 (HEMOSTASIS) ×9 IMPLANT
STAPLER VISISTAT 35W (STAPLE) ×3 IMPLANT
STOCKINETTE 6  STRL (DRAPES) ×2
STOCKINETTE 6 STRL (DRAPES) ×1 IMPLANT
SUT ETHILON 3 0 FSL (SUTURE) IMPLANT
SUT ETHILON 3 0 PS 1 (SUTURE) IMPLANT
SUT NURALON 4 0 TR CR/8 (SUTURE) ×11 IMPLANT
SUT SILK 0 TIES 10X30 (SUTURE) IMPLANT
SUT VIC AB 0 CT1 18XCR BRD8 (SUTURE) ×2 IMPLANT
SUT VIC AB 0 CT1 8-18 (SUTURE) ×6
SUT VIC AB 3-0 SH 8-18 (SUTURE) ×8 IMPLANT
SYR CONTROL 10ML LL (SYRINGE) ×3 IMPLANT
TIP SONASTAR STD MISONIX 1.9 (TRAY / TRAY PROCEDURE) IMPLANT
TIP STRAIGHT 25KHZ (INSTRUMENTS) ×2 IMPLANT
TOWEL OR 17X24 6PK STRL BLUE (TOWEL DISPOSABLE) ×3 IMPLANT
TOWEL OR 17X26 10 PK STRL BLUE (TOWEL DISPOSABLE) ×3 IMPLANT
TRAY FOLEY CATH 14FRSI W/METER (CATHETERS) ×3 IMPLANT
TUBE CONNECTING 12'X1/4 (SUCTIONS) ×1
TUBE CONNECTING 12X1/4 (SUCTIONS) ×2 IMPLANT
UNDERPAD 30X30 INCONTINENT (UNDERPADS AND DIAPERS) ×3 IMPLANT
WATER STERILE IRR 1000ML POUR (IV SOLUTION) ×3 IMPLANT

## 2014-04-06 NOTE — Op Note (Signed)
PREOP DIAGNOSIS:  1. Right frontal tumor 2. Left frontal tumor   POSTOP DIAGNOSIS: Same  PROCEDURE: 1. Stereotactic bilateral fronto-parietal craniotomy  2. Resection of left frontal extra-axial tumor 3. Resection of right frontal extra-axial tumor 4. Use of intraoperative microscope for microdissection  SURGEON: Dr. Consuella Lose, MD  ASSISTANT: Dr. Kary Kos, MD  ANESTHESIA: General Endotracheal  EBL: 800cc  SPECIMENS:  1. Right frontal tumor for permanent pathology 2. Left frontal tumor for permanent pathology  DRAINS: None  COMPLICATIONS: None immediate  CONDITION: Hemodynamically stable to PACU  HISTORY: Ivan Stark is a 68 y.o. male who initially presented to the outpatient neurosurgery clinic with leg weakness. MRI demonstrated multiple intracranial tumors, consistent with meningioma. These included a fall seen right-sided tumor just above the corpus callosum, as well as a parasagittal convexity right-sided tumor. There was also a small fall seen tumor with calcification anterior to the larger 2. There was vasogenic edema associated with the left-sided tumor. With these findings, surgical resection was indicated for diagnosis and relief of mass effect. The risks and benefits of the surgery which will end in detail to the patient his family. After all his questions were answered, informed consent was obtained.  PROCEDURE IN DETAIL: After informed consent was obtained and witnessed, the patient was brought to the operating room. After induction of general anesthesia, the patient was positioned on the operative table in the supine  position with a left-sided shoulder roll. All pressure points were meticulously padded. The Mayfield head holder was then applied to the patient and he was turned so that the right hemisphere was down, and the falx was nearly parallel to the floor. The Mayfield was then affixed to the table. Surface markers were then used to co-register with the  preoperative stereotactic MRI scan until satisfactory accuracy was achieved. Using the stereotactic system, the surface projection of both left and right frontal tumors were identified, and a sigmoid shaped incision was marked out to allow craniotomy which would access both tumors. Skin incision was then marked out and prepped and draped in the usual sterile fashion.  After timeout was conducted, skin incision was infiltrated with local anesthetic with epinephrine.. Skin incision was then made sharply and Bovie electrocautery was used to dissect the subcutaneous tissue and galea. Raney clips were used to obtain hemostasis on the skin edges. Self-retaining retractors were then placed. The sagittal suture was identified, and the stereotactic system was used to plan out a craniotomy which would allow access to both the left and right-sided tumors.  High-speed drill was then used to fashion a single piece craniotomy flap which was elevated. Dura was not violated, and Gelfoam and thrombin including morcellized Gelfoam and thrombin was used to obtain hemostasis on the dural surface including on the midline where there were multiple venous lakes and arachnoid granulations. Once good hemostasis was achieved, the dura was opened on the right side in a semilunar fashion based on the midline. At this point the microscope was draped sterilely and brought into the field, and the remainder of the case was done under the microscope using microdissection.  The right frontoparietal region appeared to be significantly stuck to the dura with multiple arachnoid granulations which were coagulated and cut, the falx was identified, and this was traced down where the extra-axial tumor was identified in its attachment to the falx. It appeared to be relatively soft, light tan in color, and was moderately vascular in its attachment. Using a combination of bipolar electrocautery as  well as the ultrasonic aspirator, the attachment to the  falx was disconnected.  In order to obtain further brain relaxation, the right hemisphere is dissected away from the falx and the contralateral hemisphere, and the distal anterior cerebral arteries were identified. Arachnoid dissection was carried out to allow further retraction.  At this point, the tumor was internally debulked using the ultrasonic aspirator. All the tumor borders were ultimately identified and white matter surrounding it was seen. During resection, specimens were sent for permanent pathology. At this point, having completed the resection of the right-sided tumor, hemostasis was achieved using accommodation of bipolar electrocautery, Gelfoam pledgets, and morcellized Gelfoam with thrombin. The surgical bed was then irrigated with copious amounts of normal saline irrigation.  The dural incision was then closed using interrupted 4-0 Nurolon stitches. Hemostasis was again achieved on the dural surface, and attention was now turned to resection of the left-sided tumor. Again in a semilunar fashion based medially, the dura was opened. Retention stitches were placed. The arachnoid appeared to be somewhat thickened and cloudy, however the tumor was identified close to the midline. Using a similar technique, the tumor was internally debulked utilizing the suction and bipolar electrocautery. The attachment of the falx was identified medially, and the tumor did appear to be invading the severe sagittal sinus without a clear margin identified. This portion of the tumor was leg related, and bleeding was controlled with morcellized Gelfoam. The anterior, lateral, posterior, and finally the deep border of the tumor were all identified. Once resection of the tumor was completed, hemostasis was achieved using morcellized Gelfoam with thrombin, and bipolar electrocautery. The wound was again irrigated with copious amounts of normal saline irrigation.  Dura was then closed using interrupted 4-0 Nurolon  stitches. Inspection of the dural surface at this point did not identify any active source of bleeding. A large sheet of Gelfoam was then placed over the dural surface. The bone flap was then replaced and plated with standard titanium plates and screws. The galea was reapproximated using interrupted oh and 3-0 Vicryl stitches, and the skin was closed using standard surgical skin staples. At the end of the case all sponge, needle, instrument, and cottonoid counts were correct. The patient was removed from Mayfield head holder, extubated, transferred to the stretcher, and taken post incision care unit in stable hemodynamic condition.

## 2014-04-06 NOTE — Anesthesia Postprocedure Evaluation (Signed)
Anesthesia Post Note  Patient: Ivan Stark  Procedure(s) Performed: Procedure(s) (LRB): Bilateral Fronto-parietal craniotomy for resection of tumors with Curve (Bilateral)  Anesthesia type: general  Patient location: PACU  Post pain: Pain level controlled  Post assessment: Patient's Cardiovascular Status Stable  Last Vitals:  Filed Vitals:   04/06/14 1545  BP: 124/57  Pulse: 102  Temp:   Resp: 21    Post vital signs: Reviewed and stable  Level of consciousness: sedated  Complications: No apparent anesthesia complications

## 2014-04-06 NOTE — Progress Notes (Signed)
UR completed.  Starlene Consuegra, RN BSN MHA CCM Trauma/Neuro ICU Case Manager [phone]  

## 2014-04-06 NOTE — Progress Notes (Signed)
Pt is "groggy," but has no other c/o.   EXAM:  BP 124/57  Pulse 102  Temp(Src) 98 F (36.7 C) (Oral)  Resp 21  Ht 6\' 4"  (1.93 m)  Wt 94.8 kg (208 lb 15.9 oz)  BMI 25.45 kg/m2  SpO2 99%  Sleepy but easily arousable  Speech fluent, CN grossly intact  5/5 BUE 2-3/5 RLE No movement of LLE   IMPRESSION:  68 y.o. male s/p bilateral crani for bilateral Peri-rolandic meningioma resection Postop leg weakness likely post-surgical  PLAN: - Cont close observation - Decadron 4mg  Q6 - cont Keppra - Postop MRI within 24-48 hrs

## 2014-04-06 NOTE — Progress Notes (Signed)
[facility] Progress Note Patient Name: Ivan Stark DOB: [birth year] MRN: [id number]   Date of Service  04/06/2014  HPI/Events of Note  S/p bilateral frontal crani for bilateral tumors Stable per camera check  eICU Interventions  No intervention needed     Intervention Category Evaluation Type: New Patient Evaluation  MCQUAID, DOUGLAS 04/06/2014, 4:01 PM

## 2014-04-06 NOTE — Progress Notes (Signed)
Call to Dr. Kathyrn Sheriff, asked that his orders be signed & released.

## 2014-04-06 NOTE — H&P (Signed)
CC:  No chief complaint on file.   HPI: [name] is a 68 year old male seen for initial consultation in the office. He comes in after undergoing MRI of the brain which demonstrated intracranial tumors. His symptoms started in July where he began noticing some instability while walking, where he felt like he was drifting to the left. He also describes a sensation of dragging his left leg especially when he gets tired. He has not actually had any falls. He also does have some numbness and tingling in both of his hands. He has not had significant headaches. He does not have any seizures.  PMH: Past Medical History  Diagnosis Date  . Diabetes mellitus without complication   . Hypertension   . Hyperlipidemia   . GERD (gastroesophageal reflux disease)   . Vitamin D deficiency   . History of colon polyps   . Complication of anesthesia     Difficult to wake up; and severe nausea and vomiting  . PONV (postoperative nausea and vomiting)     PSH: Past Surgical History  Procedure Laterality Date  . Tonsillectomy    . Colonoscopy w/ polypectomy    . Esophagus surgery      Had esophagus stretched due to food getting trapped in his throat    SH: History  Substance Use Topics  . Smoking status: Never Smoker   . Smokeless tobacco: Not on file  . Alcohol Use: No    MEDS: Prior to Admission medications   Medication Sig Start Date End Date Taking? Authorizing Provider  Ascorbic Acid (VITAMIN C) 1000 MG tablet Take 1,000 mg by mouth daily.   Yes Historical Provider, MD  aspirin EC 81 MG tablet Take 81 mg by mouth daily.   Yes Historical Provider, MD  Cholecalciferol (VITAMIN D) 2000 UNITS CAPS Take 4,000 Units by mouth daily.    Yes Historical Provider, MD  dexamethasone (DECADRON) 4 MG tablet Take 4 mg by mouth 2 (two) times daily.   Yes Historical Provider, MD  enalapril (VASOTEC) 20 MG tablet Take 20 mg by mouth daily.   Yes Historical Provider, MD  glimepiride (AMARYL) 4 MG tablet Take  2 mg by mouth 2 (two) times daily.   Yes Historical Provider, MD  metFORMIN (GLUCOPHAGE-XR) 500 MG 24 hr tablet Take 500 mg by mouth 3 (three) times daily. 500mg  in a.m., 500mg . At lunch, 1000mg . In the evening   Yes Historical Provider, MD  Multiple Vitamin (MULTIVITAMIN WITH MINERALS) TABS Take 1 tablet by mouth daily.   Yes Historical Provider, MD  pravastatin (PRAVACHOL) 40 MG tablet Take 1 tablet (40 mg total) by mouth at bedtime. 02/08/14  Yes Unk Pinto, MD  glimepiride (AMARYL) 4 MG tablet TAKE ONE-HALF TO ONE TABLET BY MOUTH TWICE DAILY WITH FOOD FOR DIABETES 03/27/14   Vicie Mutters, PA-C    ALLERGY: Allergies  Allergen Reactions  . Atenolol Shortness Of Breath  . Lipitor [Atorvastatin]     Itch    ROS: Review of Systems  Constitutional: Negative for fever and chills.  HENT: Negative for ear pain.   Eyes: Negative for blurred vision and double vision.  Respiratory: Negative for cough.   Cardiovascular: Negative for chest pain.  Gastrointestinal: Negative for heartburn, nausea and vomiting.  Genitourinary: Negative for dysuria.  Musculoskeletal: Negative for falls, myalgias and neck pain.  Skin: Negative for rash.  Neurological: Positive for tingling, sensory change and focal weakness. Negative for dizziness, tremors and headaches.  Endo/Heme/Allergies: Does not bruise/bleed easily.  NEUROLOGIC EXAM: Awake, alert, oriented Memory and concentration grossly intact Speech fluent, appropriate CN grossly intact Motor exam: Upper Extremities Deltoid Bicep Tricep Grip  Right 5/5 5/5 5/5 5/5  Left 5/5 5/5 5/5 5/5   Lower Extremity IP Quad PF DF EHL  Right 5/5 5/5 5/5 5/5 5/5  Left 5/5 5/5 5/5 5/5 5/5   Sensation grossly intact to LT  [facility]: CT scan of the brain demonstrates 3 intracranial lesions, including a heavily calcified small lesion at the bottom of the falx in the posterior frontal region.  MRI further delineates the above 3 lesions. The large left  parasagittal lesion appears to invade the superior sagittal sinus, and is homogeneously enhancing. There is no surrounding edema on the left. The right-sided lesion appears to also be based on the falx, just above the corpus callosum. This is more heterogeneous in contrast enhancement, and does exhibit surrounding vasogenic edema.   IMPRESSION: 68 year old man with 3 intracranial lesions, the small calcified one in the left-sided lesion are fairly classic for meningioma. The right-sided lesion has somewhat atypical radiographic findings, and does have some surrounding vasogenic edema. Although likely a meningioma also, the diagnosis is not quite as clear. This right-sided lesion is likely responsible for his symptoms.  PLAN: - bilateral frontoparietal craniotomy for resection of the right-sided tumor, as well as debulking and/or resection of the left-sided tumor.   I spent 60 minutes in the office with the patient and his wife reviewing MRI findings and discussing treatment options. I did tell them that while to the lesions were fairly classically appearing for meningioma, there was some atypical features of the right-sided tumor. Also, given its size and associated edema, it is symptomatic. I therefore recommended resection of the right-sided tumor, and given the proximity of the left-sided lesion and its size, resection of this lesion at the same time. I do believe the smaller calcified lesion could be left alone, and followed radiographically.   The risks of the surgery were explained in detail, including the possibility of postoperative leg weakness. Other risks of this surgery include more generalized weakness, seizure, hydrocephalus, CSF leak, bleeding, and infection. The patient and his wife understood our discussion, and all their questions were answered.

## 2014-04-06 NOTE — Anesthesia Procedure Notes (Addendum)
Procedure Name: Intubation Performed by: Garner Nash Pre-anesthesia Checklist: Patient identified, Timeout performed, Emergency Drugs available, Suction available and Patient being monitored Patient Re-evaluated:Patient Re-evaluated prior to inductionOxygen Delivery Method: Circle system utilized Preoxygenation: Pre-oxygenation with 100% oxygen Intubation Type: IV induction Ventilation: Mask ventilation without difficulty Laryngoscope Size: Mac and 4 Grade View: Grade III Tube type: Oral Tube size: 7.5 mm Number of attempts: 1 Airway Equipment and Method: Stylet Placement Confirmation: ETT inserted through vocal cords under direct vision,  breath sounds checked- equal and bilateral,  positive ETCO2 and CO2 detector Secured at: 22 cm Tube secured with: Tape Dental Injury: Teeth and Oropharynx as per pre-operative assessment  Comments: Limited mouth opening    The patient was identified and consent obtained.  TO was performed, and full barrier precautions were used.  The skin was anesthetized with lidocaine.  Once the vein was located with the 22 ga., the wire was inserted into the vein. The insertion site was dilated and the CVL was carefully inserted and sutured in place. The patient tolerated the procedure well.  CXR was ordered for PACU.Ultrasound guidance: relevant anatomy identified, needle position confirmed, vessel patent, wire seen inside the vessel.  Images printed for the medical record.  Start: 1937 End: 0726 J. Tedra Senegal, MD

## 2014-04-06 NOTE — Anesthesia Preprocedure Evaluation (Addendum)
Anesthesia Evaluation    Reviewed: Allergy & Precautions, H&P , NPO status   History of Anesthesia Complications (+) PONV and history of anesthetic complications  Airway        Dental   Pulmonary neg pulmonary ROS,          Cardiovascular hypertension, Pt. on medications     Neuro/Psych negative psych ROS   GI/Hepatic Neg liver ROS, GERD-  ,  Endo/Other  diabetes  Renal/GU negative Renal ROS     Musculoskeletal   Abdominal   Peds  Hematology   Anesthesia Other Findings   Reproductive/Obstetrics                            Anesthesia Physical Anesthesia Plan  ASA: III  Anesthesia Plan: General   Post-op Pain Management:    Induction: Intravenous  Airway Management Planned: Oral ETT  Additional Equipment: Arterial line and CVP  Intra-op Plan:   Post-operative Plan: Possible Post-op intubation/ventilation  Informed Consent:   Plan Discussed with:   Anesthesia Plan Comments:        Anesthesia Quick Evaluation

## 2014-04-06 NOTE — Transfer of Care (Signed)
2Immediate Anesthesia Transfer of Care Note  Patient: Ivan Stark  Procedure(s) Performed: Procedure(s) with comments: Bilateral Fronto-parietal craniotomy for resection of tumors with Curve (Bilateral) - Bilateral Fronto-parietal craniotomy for resection of tumors with Curve  Patient Location: PACU  Anesthesia Type:General  Level of Consciousness: awake and alert   Airway & Oxygen Therapy: Patient Spontanous Breathing and Patient connected to nasal cannula oxygen  Post-op Assessment: Report given to PACU RN and Post -op Vital signs reviewed and stable  Post vital signs: Reviewed and stable  Complications: No apparent anesthesia complications

## 2014-04-07 ENCOUNTER — Encounter (HOSPITAL_COMMUNITY): Payer: Self-pay | Admitting: *Deleted

## 2014-04-07 ENCOUNTER — Inpatient Hospital Stay (HOSPITAL_COMMUNITY): Payer: Medicare Other

## 2014-04-07 LAB — GLUCOSE, CAPILLARY
Glucose-Capillary: 265 mg/dL — ABNORMAL HIGH (ref 70–99)
Glucose-Capillary: 274 mg/dL — ABNORMAL HIGH (ref 70–99)

## 2014-04-07 LAB — BASIC METABOLIC PANEL
Anion gap: 12 (ref 5–15)
BUN: 9 mg/dL (ref 6–23)
CO2: 22 mEq/L (ref 19–32)
Calcium: 8.7 mg/dL (ref 8.4–10.5)
Chloride: 106 mEq/L (ref 96–112)
Creatinine, Ser: 0.84 mg/dL (ref 0.50–1.35)
GFR calc non Af Amer: 89 mL/min — ABNORMAL LOW (ref >90)
GLUCOSE: 266 mg/dL — AB (ref 70–99)
POTASSIUM: 4.2 meq/L (ref 3.7–5.3)
Sodium: 140 mEq/L (ref 137–147)

## 2014-04-07 LAB — CBC
HCT: 33 % — ABNORMAL LOW (ref 39.0–52.0)
Hemoglobin: 10.9 g/dL — ABNORMAL LOW (ref 13.0–17.0)
MCH: 29.8 pg (ref 26.0–34.0)
MCHC: 33 g/dL (ref 30.0–36.0)
MCV: 90.2 fL (ref 78.0–100.0)
Platelets: 200 10*3/uL (ref 150–400)
RBC: 3.66 MIL/uL — AB (ref 4.22–5.81)
RDW: 13.4 % (ref 11.5–15.5)
WBC: 23.5 10*3/uL — ABNORMAL HIGH (ref 4.0–10.5)

## 2014-04-07 MED ORDER — GADOBENATE DIMEGLUMINE 529 MG/ML IV SOLN
20.0000 mL | Freq: Once | INTRAVENOUS | Status: AC | PRN
  Administered 2014-04-07: 20 mL via INTRAVENOUS

## 2014-04-07 NOTE — Progress Notes (Signed)
Subjective: Patient reports Is doing well minimal to no headache no nausea  Objective: Vital signs in last 24 hours: Temp:  [98 F (36.7 C)-99.2 F (37.3 C)] 98 F (36.7 C) (10/31 0753) Pulse Rate:  [75-115] 83 (10/31 0800) Resp:  [0-27] 20 (10/31 0800) BP: (106-143)/(57-88) 124/62 mmHg (10/31 0800) SpO2:  [92 %-100 %] 98 % (10/31 0800) Arterial Line BP: (110-187)/(52-77) 160/73 mmHg (10/31 0600) Weight:  [94.8 kg (208 lb 15.9 oz)] 94.8 kg (208 lb 15.9 oz) (10/30 1510)  Intake/Output from previous day: 10/30 0701 - 10/31 0700 In: 6198.3 [I.V.:4898.3; IV Piggyback:1300] Out: 8756 [Urine:3580; Blood:775] Intake/Output this shift: Total I/O In: 100 [I.V.:100] Out: -   Awake and alert right lower extremity 4+ out of 5 left lower extremity 1-2 out of 5 but spastic  Lab Results:  Recent Labs  04/06/14 1610 04/07/14 0625  WBC 13.9* 23.5*  HGB 11.4* 10.9*  HCT 34.0* 33.0*  PLT 188 200   BMET  Recent Labs  04/06/14 1220 04/06/14 1610 04/07/14 0625  NA 141  --  140  K 4.2  --  4.2  CL  --   --  106  CO2  --   --  22  GLUCOSE 261*  --  266*  BUN  --   --  9  CREATININE  --  0.86 0.84  CALCIUM  --   --  8.7    Studies/Results: Dg Chest Port 1 View  04/06/2014   CLINICAL DATA:  Initial encounter for central line placement  EXAM: PORTABLE CHEST - 1 VIEW  COMPARISON:  03/29/2014  FINDINGS: 1355 hrs. Right IJ central line is new in the interval with tip position overlying the proximal to mid SVC level. Patient is rotated to the right, displacing cardio mediastinal anatomy into the medial right hemi thorax. No evidence for pneumothorax or pleural effusion. Vascular congestion is evident without airspace pulmonary edema. Cardiopericardial silhouette is at upper limits of normal for size. Imaged bony structures of the thorax are intact.  IMPRESSION: No evidence for pneumothorax status post central line placement.   Electronically Signed   By: Misty Stanley M.D.   On: 04/06/2014  14:22    Assessment/Plan: Continue IV Decadron DC A-line DC Foley DC triple-lumen catheter mobilized out of bed with physical therapy.  LOS: 1 day     Atasha Colebank P 04/07/2014, 8:08 AM

## 2014-04-08 LAB — GLUCOSE, CAPILLARY
GLUCOSE-CAPILLARY: 315 mg/dL — AB (ref 70–99)
GLUCOSE-CAPILLARY: 122 mg/dL — AB (ref 70–99)
Glucose-Capillary: 288 mg/dL — ABNORMAL HIGH (ref 70–99)
Glucose-Capillary: 296 mg/dL — ABNORMAL HIGH (ref 70–99)

## 2014-04-08 MED ORDER — PANTOPRAZOLE SODIUM 40 MG PO TBEC
40.0000 mg | DELAYED_RELEASE_TABLET | Freq: Every day | ORAL | Status: DC
  Administered 2014-04-08 – 2014-04-10 (×3): 40 mg via ORAL
  Filled 2014-04-08 (×3): qty 1

## 2014-04-08 NOTE — Plan of Care (Signed)
Problem: Consults Goal: Craniotomy Patient Education See Patient Education Module for education specifics.  Outcome: Completed/Met Date Met:  04/08/14 Goal: Diagnosis - Craniotomy Outcome: Completed/Met Date Met:  04/08/14 Crani tumor removal Goal: Skin Care Protocol Initiated - if Braden Score 18 or less If consults are not indicated, leave blank or document N/A  Outcome: Completed/Met Date Met:  04/08/14 Goal: Nutrition Consult-if indicated Outcome: Not Applicable Date Met:  07/46/00 Goal: Diabetes Guidelines if Diabetic/Glucose > 140 If diabetic or lab glucose is > 140 mg/dl - Initiate Diabetes/Hyperglycemia Guidelines & Document Interventions  Outcome: Completed/Met Date Met:  04/08/14  Problem: Phase I Progression Outcomes Goal: Respiratory status stable Outcome: Completed/Met Date Met:  04/08/14 Goal: Hemodynamically stable Outcome: Completed/Met Date Met:  04/08/14 Goal: Pain controlled with appropriate interventions Outcome: Completed/Met Date Met:  04/08/14 Goal: Bedrest HOB at 30 degrees Outcome: Completed/Met Date Met:  04/08/14 Goal: Voiding-avoid urinary catheter unless indicated Outcome: Completed/Met Date Met:  04/08/14

## 2014-04-08 NOTE — Progress Notes (Signed)
Patient ID: Ivan Stark, male   DOB: 07/22/1945, 68 y.o.   MRN: [id number] Significant improvement in lower extremityfunction good hip flexor function now 4 minus out of 5 still distally 1-2 out of 5 right lower extremity 5 out of 5  Wound clean dry and intact  Transfer to floor PT OT

## 2014-04-08 NOTE — Progress Notes (Signed)
Patient has arrived on the unit with ICU RN.

## 2014-04-09 ENCOUNTER — Encounter (HOSPITAL_COMMUNITY): Payer: Self-pay | Admitting: Neurosurgery

## 2014-04-09 DIAGNOSIS — G8190 Hemiplegia, unspecified affecting unspecified side: Secondary | ICD-10-CM

## 2014-04-09 DIAGNOSIS — G8194 Hemiplegia, unspecified affecting left nondominant side: Secondary | ICD-10-CM

## 2014-04-09 DIAGNOSIS — D432 Neoplasm of uncertain behavior of brain, unspecified: Secondary | ICD-10-CM

## 2014-04-09 LAB — GLUCOSE, CAPILLARY
GLUCOSE-CAPILLARY: 238 mg/dL — AB (ref 70–99)
GLUCOSE-CAPILLARY: 188 mg/dL — AB (ref 70–99)
Glucose-Capillary: 227 mg/dL — ABNORMAL HIGH (ref 70–99)
Glucose-Capillary: 189 mg/dL — ABNORMAL HIGH (ref 70–99)

## 2014-04-09 NOTE — Consult Note (Signed)
Physical Medicine and Rehabilitation Consult Reason for Consult:intracranial tumor Referring Physician: Dr. Kathyrn Sheriff   HPI: Ivan Stark is a 68 y.o.right handed male with history of diabetes mellitus peripheral neuropathy, hypertension. Admitted 04/06/2014 with instability of gait dating back to July drifting to the left. He also described dragging of his left leg. Denies any recent falls.MRI and imaging demonstrated multiple intracranial tumors consistent with meningioma.underwent stereotactic bilateral frontoparietal craniotomy and resection of left frontal extra-axial tumor as well as right frontal extra-axial tumor 04/06/2014 per Dr. Waynetta Sandy on Decadron protocol. Keppra added for seizure prophylaxis. He is tolerating a regular diet.physical and occupational therapy evaluations pending. M.D. Has requested physical medicine rehabilitation consult.  Patient just finished physical therapy. Listed as moderate assist for ambulation 3 feet only, patient motivated  Review of Systems  Gastrointestinal: Positive for constipation.       GERD  Musculoskeletal: Positive for myalgias.  Neurological: Positive for weakness.  All other systems reviewed and are negative.  Past Medical History  Diagnosis Date  . Diabetes mellitus without complication   . Hypertension   . Hyperlipidemia   . GERD (gastroesophageal reflux disease)   . Vitamin D deficiency   . History of colon polyps   . Complication of anesthesia     Difficult to wake up; and severe nausea and vomiting  . PONV (postoperative nausea and vomiting)    Past Surgical History  Procedure Laterality Date  . Tonsillectomy    . Colonoscopy w/ polypectomy    . Esophagus surgery      Had esophagus stretched due to food getting trapped in his throat   Family History  Problem Relation Age of Onset  . Goiter Mother   . Hypertension Father   . Cancer Father     colon   Social History:  reports that he has never  smoked. He does not have any smokeless tobacco history on file. He reports that he does not drink alcohol or use illicit drugs. Allergies:  Allergies  Allergen Reactions  . Atenolol Shortness Of Breath  . Lipitor [Atorvastatin]     Itch   Medications Prior to Admission  Medication Sig Dispense Refill  . Ascorbic Acid (VITAMIN C) 1000 MG tablet Take 1,000 mg by mouth daily.    Marland Kitchen aspirin EC 81 MG tablet Take 81 mg by mouth daily.    . Cholecalciferol (VITAMIN D) 2000 UNITS CAPS Take 4,000 Units by mouth daily.     Marland Kitchen dexamethasone (DECADRON) 4 MG tablet Take 4 mg by mouth 2 (two) times daily.    . enalapril (VASOTEC) 20 MG tablet Take 20 mg by mouth daily.    Marland Kitchen glimepiride (AMARYL) 4 MG tablet Take 2 mg by mouth 2 (two) times daily.    . metFORMIN (GLUCOPHAGE-XR) 500 MG 24 hr tablet Take 500 mg by mouth 3 (three) times daily. 500mg  in a.m., 500mg . At lunch, 1000mg . In the evening    . Multiple Vitamin (MULTIVITAMIN WITH MINERALS) TABS Take 1 tablet by mouth daily.    . pravastatin (PRAVACHOL) 40 MG tablet Take 1 tablet (40 mg total) by mouth at bedtime. 30 tablet 1  . glimepiride (AMARYL) 4 MG tablet TAKE ONE-HALF TO ONE TABLET BY MOUTH TWICE DAILY WITH FOOD FOR DIABETES 60 tablet 0    Home: Home Living Family/patient expects to be discharged to:: Private residence Living Arrangements: Spouse/significant other, Children, Other relatives  Functional History:   Functional Status:  Mobility:  ADL:    Cognition: Cognition Orientation Level: Oriented X4    Blood pressure 128/66, pulse 64, temperature 98.2 F (36.8 C), temperature source Oral, resp. rate 18, height 6\' 4"  (1.93 m), weight 94.8 kg (208 lb 15.9 oz), SpO2 93 %. Physical Exam  Vitals reviewed. Constitutional: He is oriented to person, place, and time. He appears well-developed.  HENT:  Craniotomy site dressed  Eyes: EOM are normal.  Neck: Normal range of motion. Neck supple. No thyromegaly present.    Cardiovascular: Normal rate and regular rhythm.   Respiratory: Effort normal and breath sounds normal. No respiratory distress.  GI: Soft. Bowel sounds are normal. He exhibits no distension.  Neurological: He is alert and oriented to person, place, and time.  Follows commands  Skin: Skin is warm and dry.  Right upper extremity 5/5 deltoid, biceps, triceps, grip, left upper extremity 4+/5 in the deltoid, bicep, tricep, grip 4/5 right hip flexor knee extensor and ankle dorsiflexor plantar flexor Left lower extremity 3 minus hip flexor knee extensor ankle dorsiflexor plantar flexor Sensation intact to light touch bilateral lower extremities Mood and affect are appropriate No evidence of left neglect  Results for orders placed or performed during the hospital encounter of 04/06/14 (from the past 24 hour(s))  Glucose, capillary     Status: Abnormal   Collection Time: 04/08/14  8:08 AM  Result Value Ref Range   Glucose-Capillary 315 (H) 70 - 99 mg/dL  Glucose, capillary     Status: Abnormal   Collection Time: 04/08/14 11:29 AM  Result Value Ref Range   Glucose-Capillary 288 (H) 70 - 99 mg/dL  Glucose, capillary     Status: Abnormal   Collection Time: 04/08/14  4:46 PM  Result Value Ref Range   Glucose-Capillary 296 (H) 70 - 99 mg/dL  Glucose, capillary     Status: Abnormal   Collection Time: 04/08/14 10:36 PM  Result Value Ref Range   Glucose-Capillary 122 (H) 70 - 99 mg/dL   Comment 1 Notify RN    Comment 2 Documented in Chart    Mr Ivan Stark Wo Contrast  04/07/2014   : CLINICAL DATA: Postop tumor resection 04/06/2014. Now with left leg weakness. Bilateral craniotomy for bilateral perirolandic meningioma resection.  EXAM: MRI HEAD WITHOUT AND WITH CONTRAST  TECHNIQUE: Multiplanar, multiecho pulse sequences of the brain and surrounding structures were obtained without and with intravenous contrast.  CONTRAST: 20 mL MultiHance IV  COMPARISON: MRI 03/21/2014  FINDINGS: Postop day 1  bilateral parietal craniotomy for bilateral meningioma resection.  Right parasagittal meningioma resection. Surgical cavity is filled with blood on the right. There is enhancement of the wall of the surgical cavity without masslike features. Although it is only been 24 hr, this enhancement pattern may be postop enhancement rather than residual tumor. Continued close follow-up recommended.  Left parasagittal meningioma resection. Postop surgical cavity containing blood. Mild peripheral enhancement around the cavity similar that seen on the right may be postop enhancement.  Diffusion-weighted imaging reveals several areas of restricted diffusion consistent with acute infarct. Small area of acute infarction in the right parietal cortex, several cm from the resection site. Small areas of restricted diffusion in the frontal lobes bilaterally compatible with small areas of infarction. Restricted diffusion is also present over the convexity bilaterally near the resection site most consistent with infarct adjacent to the tumor resection site. This measures approximately 10 x 15 mm on the right and 15 mm by 20 mm on the left.  Calcified meningioma along the  falx measuring approximate 1 cm is unchanged and was not resected.  Ventricle size is normal. No shift of the midline structures.  IMPRESSION: Postop day 1 resection of bilateral parasagittal meningioma is in the parietal lobe. Post surgical cavity filled with blood bilaterally. There is thin linear enhancement along the wall of the surgical cavity bilaterally likely due to early postoperative enhancement. Continued followup is suggested to rule out residual tumor.  Acute infarction adjacent to the resection cavity bilaterally. Also remote small areas of infarct in the right parietal lobe and bilateral frontal lobes. Question emboli   Electronically Signed   By: Franchot Gallo M.D.   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 Does the need for close, 24 hr/day medical supervision in concert with the patient's rehab needs make it unreasonable for this patient to be served in a less intensive setting? Yes 2. Co-Morbidities requiring supervision/potential complications: NIDDM, HTN 3. Due to bladder management, bowel management, safety, skin/wound care, disease management, medication administration, pain management and patient education, does the patient require 24 hr/day rehab nursing? Yes 4. Does the patient require coordinated care of a physician, rehab nurse, PT (1-2 hrs/day, 5 days/week) and OT (1-2 hrs/day, 55 days/week) to address physical and functional deficits in the context of the above medical diagnosis(es)? Yes Addressing deficits in the following areas: balance, endurance, locomotion, strength, transferring, bowel/bladder control, bathing, dressing and toileting 5. Can the patient actively participate in an intensive therapy program of at least 3 hrs of therapy per day at least 5 days per week? Yes 6. The potential for patient to make measurable gains while on inpatient rehab is excellent 7. Anticipated functional outcomes upon discharge from inpatient rehab are modified independent  with PT, modified independent with OT, n/a with SLP. 8. Estimated rehab length of stay to reach the above functional goals is: 10-14d 9.  10. Does the patient have adequate social supports to accommodate these discharge functional goals? Yes 11. Anticipated D/C setting: Home 12. Anticipated post D/C treatments: [place] therapy 13. Overall Rehab/Functional Prognosis: excellent  RECOMMENDATIONS: This patient's condition is appropriate for continued rehabilitative care in the following setting: CIR Patient has agreed to participate in recommended program. Yes Note that insurance prior authorization may be required for reimbursement for recommended care.  Comment:     04/09/2014

## 2014-04-09 NOTE — Evaluation (Signed)
Occupational Therapy Evaluation Patient Details Name: Ivan Stark MRN: [id number] DOB: 06/21/1945 Today's Date: 04/09/2014    History of Present Illness Adm 04/06/14 for bilateral frontal-parietal craniotomy. PMHx- HTN, DM   Clinical Impression   Prior to admission, pt performed ADL and IADL independently.  He works as a Theme park manager.  Pt now presents with impaired sitting and standing balance with heavy reliance on his UEs in standing with L LE weakness interfering with ability to perform ADL.  He fatigues easily. Pt is highly motivated to return to independence. He will be an ideal inpatient rehab candidate.  Will follow acutely.    Follow Up Recommendations  CIR    Equipment Recommendations  Other (comment) (to be determined)    Recommendations for Other Services Rehab consult     Precautions / Restrictions Precautions Precautions: Fall Restrictions Weight Bearing Restrictions: No      Mobility Bed Mobility      General bed mobility comments: pt up in chair  Transfers Overall transfer level: Needs assistance Equipment used: Rolling walker (2 wheeled) Transfers: Sit to/from Stand Sit to Stand: Min assist         General transfer comment: verbal cues for hand placement and technique, leaning to R initially, heavy reliance on UEs    Balance Overall balance assessment: Needs assistance Sitting-balance support: Bilateral upper extremity supported;Feet supported Sitting balance-Leahy Scale: Poor   Postural control: Posterior lean;Right lateral lean Standing balance support: Bilateral upper extremity supported Standing balance-Leahy Scale: Poor                              ADL Overall ADL's : Needs assistance/impaired Eating/Feeding: Independent;Sitting   Grooming: Wash/dry hands;Wash/dry face;Oral care;Sitting   Upper Body Bathing: Sitting;Moderate assistance   Lower Body Bathing: Maximal assistance;Sit to/from stand   Upper Body Dressing :  Sitting;Moderate assistance   Lower Body Dressing: Maximal assistance;Sit to/from stand                 General ADL Comments: Pt with decreased sitting balance interfering with bathing and dressing.  Posterior and R lean.  Effective use of hands requires supported sitting in recliner.     Vision                     Perception     Praxis      Pertinent Vitals/Pain Pain Assessment: No/denies pain     Hand Dominance Right   Extremity/Trunk Assessment Upper Extremity Assessment Upper Extremity Assessment: Overall WFL for tasks assessed (numbness in finger tips, may be related to DM)   Lower Extremity Assessment Lower Extremity Assessment: Defer to PT evaluation LLE Deficits / Details: hip flexion 2+, knee extension 1+, dorsiflexion 1+; pt reports episodes of extensor tone/spasms LLE Sensation:  (light touch intact; tingling bil feet (?neuropathy))   Cervical / Trunk Assessment Cervical / Trunk Assessment: Kyphotic   Communication Communication Communication: Expressive difficulties (pt reports decreased fluency)   Cognition Arousal/Alertness: Awake/alert Behavior During Therapy: WFL for tasks assessed/performed Overall Cognitive Status: Within Functional Limits for tasks assessed                     General Comments       Exercises   Shoulder Instructions      Home Living Family/patient expects to be discharged to:: Private residence Living Arrangements: Spouse/significant other Available Help at Discharge: Family;Friend(s);Available 24 hours/day Type of Home: House Home Access: Stairs  to enter Entrance Stairs-Number of Steps: 3 Entrance Stairs-Rails: None Home Layout: One level         Biochemist, clinical: Standard     Home Equipment: None          Prior Functioning/Environment Level of Independence: Independent        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    OT Diagnosis: Generalized weakness   OT Problem List: Decreased  strength;Decreased activity tolerance;Impaired balance (sitting and/or standing);Decreased knowledge of use of DME or AE   OT Treatment/Interventions: Self-care/ADL training;DME and/or AE instruction;Therapeutic activities;Balance training;Patient/family education    OT Goals(Current goals can be found in the care plan section) Acute Rehab OT Goals Patient Stated Goal: to walk again OT Goal Formulation: With patient Time For Goal Achievement: 04/23/14 Potential to Achieve Goals: Good ADL Goals Pt Will Perform Grooming: with min guard assist;standing Pt Will Perform Upper Body Bathing: with supervision;sitting Pt Will Perform Upper Body Dressing: with supervision;sitting Pt Will Transfer to Toilet: with min assist;ambulating;bedside commode Additional ADL Goal #1: Pt will sit unsupported with supervision while involved in ADL.  OT Frequency: Min 3X/week   Barriers to D/C:            Co-evaluation              End of Session Equipment Utilized During Treatment: Gait belt;Rolling walker  Activity Tolerance: Patient limited by fatigue Patient left: in chair;with call bell/phone within reach;with family/visitor present;with chair alarm set   Time: 3875-6433 OT Time Calculation (min): 22 min Charges:  OT General Charges $OT Visit: 1 Procedure OT Evaluation $Initial OT Evaluation Tier I: 1 Procedure OT Treatments $Self Care/Home Management : 8-22 mins G-Codes:    Malka So 04/09/2014, 12:59 PM  [phone]

## 2014-04-09 NOTE — Progress Notes (Signed)
No issues overnight. Pt reports no HA. Had some spasms of RLE, not painful. Stable subjective RLE strength.  EXAM:  BP 119/72 mmHg  Pulse 68  Temp(Src) 97.8 F (36.6 C) (Oral)  Resp 18  Ht 6\' 4"  (1.93 m)  Wt 94.8 kg (208 lb 15.9 oz)  BMI 25.45 kg/m2  SpO2 97%  Awake, alert, oriented  Speech fluent, appropriate  CN grossly intact  5/5 BUE 5/5 RLE 3/5 proximal LLE, 1/5 PF, 0/5 DF  Wound c/d/i  MRI: Blood products/air within resection cavities. Linear enhancement seen along anterior margin of right falcine cavity - may be small residual tumor vs postop brain enhancement.   IMPRESSION:  68 y.o. male POD#3 s/p bilateral crani for left parasagittal / right falcine meningiomas Improving LLE strength. RLE now normal  PLAN: - Cont to observe - PT/OT to mobilize today - SSI for glucose coverage

## 2014-04-09 NOTE — Progress Notes (Signed)
Inpatient Diabetes Program Recommendations  AACE/ADA: New Consensus Statement on Inpatient Glycemic Control (2013)  Target Ranges:  Prepandial:   less than 140 mg/dL      Peak postprandial:   less than 180 mg/dL (1-2 hours)      Critically ill patients:  140 - 180 mg/dL   Reason for Assessment:  Results for TEDDY, REBSTOCK (MRN [id number]) as of 04/09/2014 12:04  Ref. Range 04/08/2014 11:29 04/08/2014 16:46 04/08/2014 22:36 04/09/2014 07:26 04/09/2014 11:36  Glucose-Capillary Latest Range: 70-99 mg/dL 288 (H) 296 (H) 122 (H) 238 (H) 227 (H)    Diabetes history: Type 2 diabetes Outpatient Diabetes medications: Amaryl 2 mg bid, Metformin 500 mg tid with meals Current orders for Inpatient glycemic control:  Amaryl 2 mg bid, Metformin 500 mg with breakfast and 1500 mg with supper, Novolog resistant tid with meals  Patient is also on IV Decadron which is likely increasing CBG's.   May consider adding Levemir 15 units daily while on steroids.  Thanks, Adah Perl, RN, BC-ADM Inpatient Diabetes Coordinator Pager [phone]

## 2014-04-09 NOTE — Progress Notes (Addendum)
Rehab admissions:  Evaluated for possible admission.  I met with patient and his wife.  They would like inhouse rehab.  I will send information to [facility] insurance carrier to request inpatient rehab admission.  I will follow up again tomorrow.  Call me for questions.  #[phone]   Information faxed to insurance carrier at [phone].  #[phone]

## 2014-04-09 NOTE — Evaluation (Addendum)
Physical Therapy Evaluation Patient Details Name: Ivan Stark MRN: [id number] DOB: [birth year] Today's Date: 04/09/2014   History of Present Illness  Adm 04/06/14 for bilateral frontal-parietal craniotomy. PMHx- HTN, DM  Clinical Impression  Patient is s/p above surgery resulting in functional limitations due to the deficits listed below (see PT Problem List). Pt is very motivated, able to begin to problem-solve how to manage his Lt sided weakness, and showing fair safety awareness. Feel patient will benefit from intensive therapies to incr his safety prior to d/c home with family support.  Patient will benefit from skilled PT to increase their independence and safety with mobility to allow discharge to the venue listed below.       Follow Up Recommendations CIR    Equipment Recommendations  Rolling walker with 5" wheels    Recommendations for Other Services Rehab consult;OT consult;Speech consult     Precautions / Restrictions Precautions Precautions: Fall Restrictions Weight Bearing Restrictions: No      Mobility  Bed Mobility Overal bed mobility: Needs Assistance Bed Mobility: Supine to Sit     Supine to sit: Mod assist     General bed mobility comments: HOB 0 no rail; pt allowed to problem-solve his way to get to EOB; he used his RUE to assist his LLE over EOB, multiple losses of balance posteriorly and to the right (especially if he lifting Rt hand off the mattress) required assist to prevent fall  Transfers Overall transfer level: Needs assistance Equipment used: Rolling walker (2 wheeled) Transfers: Sit to/from Stand Sit to Stand: Min assist         General transfer comment: vc for safe use of RW; pt preferred to use one hand on RW with PT stabilizing RW; initially weight shifted to his Rt, able to come to midline with incr UE support on RW with +hip and knee extension palpable  Ambulation/Gait Ambulation/Gait assistance: Mod assist Ambulation Distance  (Feet): 3 Feet Assistive device: Rolling walker (2 wheeled) Gait Pattern/deviations: Step-to pattern;Decreased step length - left;Decreased dorsiflexion - left;Decreased weight shift to left (Lt hip hike)   Gait velocity interpretation: <1.8 ft/sec, indicative of risk for recurrent falls General Gait Details: pt able to advance LLE forward using hip hike; required assist to step backwards with LLE; incr bil UE support with no Lt knee buckling, however do not feel pt was at any time placing full weight on the LLE  Stairs            Wheelchair Mobility    Modified Rankin (Stroke Patients Only)       Balance Overall balance assessment: Needs assistance Sitting-balance support: Bilateral upper extremity supported;Feet supported Sitting balance-Leahy Scale: Poor   Postural control: Posterior lean;Right lateral lean Standing balance support: Bilateral upper extremity supported Standing balance-Leahy Scale: Poor                               Pertinent Vitals/Pain Pain Assessment: No/denies pain    Home Living Family/patient expects to be discharged to:: Private residence Living Arrangements: Spouse/significant other Available Help at Discharge: Family;Friend(s);Available 24 hours/day (children local; church members) Type of Home: House Home Access: Stairs to enter Entrance Stairs-Rails: None (wall on left or right) Entrance Stairs-Number of Steps: 3 Home Layout: One level Home Equipment: None      Prior Function Level of Independence: Independent         Comments: since July leaning to lt; at times Lt foot  dragging     Hand Dominance   Dominant Hand: Right    Extremity/Trunk Assessment   Upper Extremity Assessment: Defer to OT evaluation;Overall WFL for tasks assessed           Lower Extremity Assessment: LLE deficits/detail   LLE Deficits / Details: hip flexion 2+, knee extension 1+, dorsiflexion 1+; pt reports episodes of extensor  tone/spasms  Cervical / Trunk Assessment: Kyphotic  Communication   Communication: Expressive difficulties  Cognition Arousal/Alertness: Awake/alert Behavior During Therapy: Restless (reports steroids make him this way) Overall Cognitive Status: No family/caregiver present to determine baseline cognitive functioning                      General Comments      Exercises Other Exercises Other Exercises: Instructed pt in bil ankle pumps, knee presses, and heel slides several times throughout the day      Assessment/Plan    PT Assessment Patient needs continued PT services  PT Diagnosis Hemiplegia non-dominant side   PT Problem List Decreased strength;Decreased activity tolerance;Decreased balance;Decreased mobility;Decreased cognition;Decreased knowledge of use of DME;Decreased safety awareness;Decreased knowledge of precautions;Impaired sensation  PT Treatment Interventions DME instruction;Gait training;Stair training;Functional mobility training;Therapeutic activities;Balance training;Neuromuscular re-education;Cognitive remediation;Patient/family education   PT Goals (Current goals can be found in the Care Plan section) Acute Rehab PT Goals Patient Stated Goal: to walk again PT Goal Formulation: With patient Time For Goal Achievement: 04/16/14 Potential to Achieve Goals: Good    Frequency Min 4X/week   Barriers to discharge Inaccessible home environment 3 steps no rails to enter    Co-evaluation               End of Session Equipment Utilized During Treatment: Gait belt Activity Tolerance: Patient tolerated treatment well Patient left: in chair;with call bell/phone within reach;with chair alarm set Nurse Communication: Mobility status (turn chair to transfer to Rt)         Time: 6803-2122 PT Time Calculation (min): 32 min   Charges:   PT Evaluation $Initial PT Evaluation Tier I: 1 Procedure PT Treatments $Gait Training: 8-22 mins $Therapeutic  Activity: 8-22 mins   PT G Codes:          Idania Desouza 05-07-2014, 10:30 AM  Pager [phone]

## 2014-04-09 NOTE — PMR Pre-admission (Signed)
PMR Admission Coordinator Pre-Admission Assessment  Patient: Ivan Stark is an 68 y.o., male MRN: [id number] DOB: 23-Sep-1945 Height: 6' 4"  (193 cm) Weight: 94.8 kg (208 lb 15.9 oz)              Insurance Information HMO: Yes    PPO:       PCP:       IPA:       80/20:       OTHER: Group # [id number] PRIMARY: [employer]      Policy#: [id number]      Subscriber: Ivan Stark CM Name: Josephina Gip      Phone#: [phone]     Fax#: [phone] Pre-Cert#:                                       Employer:  [employer] Benefits:  Phone #: [phone]     Name: Checked on line Eff. Date: 06/08/13     Deduct:  $0      Out of Pocket Max: $4500 (met $278.23)      Life Max: unlimited CIR: $250 days 1-6      SNF: $0 days 1-20; $60 days 21-100 Outpatient: medical necessity     Co-Pay: $35/visit Home Health: 100%      Co-Pay: none DME: 80%     Co-Pay: 20% Providers: in network  Emergency Contact Information Contact Information    Name Relation Home Work Mobile   Joswick,[name] Spouse [phone]  [phone]     Current Medical History  Patient Admitting Diagnosis: Right frontal meningioma status post resection with left hemiparesis  History of Present Illness: A 68 y.o. H/o DM type 2, HTN, gait instability with LLE weakness since July 2015 and MRI of brain revealed three intracranial tumors consistent with meningioma and right sided lesion with edema likely responsible for symptoms. He was evaluated by Dr. Kathyrn Sheriff and admitted on 04/06/14 for stereotactic bilateral frontoparietal craniotomy with resection of left and right frontal tumor. Post op leg weakness treated with decadron. 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 He has had elevated BS due to steroids and was started on levemir as well as decadron taper. He has had some improvement in LE strength and therapy ongoing. Patient  with flexor tone LLE as well as poor safety and impulsivity. CIR recommended by Rehab team and MD and patient ready for admission today.     Past Medical History  Past Medical History  Diagnosis Date  . Diabetes mellitus without complication   . Hypertension   . Hyperlipidemia   . GERD (gastroesophageal reflux disease)   . Vitamin D deficiency   . History of colon polyps   . Complication of anesthesia     Difficult to wake up; and severe nausea and vomiting  . PONV (postoperative nausea and vomiting)     Family History  family history includes Cancer in his father; Goiter in his mother; Hypertension in his father.  Prior Rehab/Hospitalizations:  No previous rehab admissions.   Current Medications  Current facility-administered medications: 0.9 %  sodium chloride infusion, , Intravenous, Continuous, Consuella Lose, MD, Last Rate: 100 mL/hr at 04/09/14 0012;  dexamethasone (DECADRON) tablet 4 mg, 4 mg, Oral, Q12H, Consuella Lose, MD, 4 mg at 04/11/14 0951;  docusate sodium (COLACE) capsule 100 mg, 100 mg, Oral, BID, Nena Polio  Kathyrn Sheriff, MD, 100 mg at 04/11/14 0950 enalapril (VASOTEC) tablet 20 mg, 20 mg, Oral, Daily, Consuella Lose, MD, 20 mg at 04/10/14 1005;  glimepiride (AMARYL) tablet 2 mg, 2 mg, Oral, BID WC, Consuella Lose, MD, 2 mg at 04/11/14 0950;  HYDROcodone-acetaminophen (NORCO/VICODIN) 5-325 MG per tablet 1 tablet, 1 tablet, Oral, Q4H PRN, Consuella Lose, MD, 1 tablet at 04/10/14 2340;  insulin aspart (novoLOG) injection 0-20 Units, 0-20 Units, Subcutaneous, TID WC, Consuella Lose, MD insulin detemir (LEVEMIR) injection 15 Units, 15 Units, Subcutaneous, QHS, Consuella Lose, MD, 15 Units at 04/10/14 2159;  labetalol (NORMODYNE,TRANDATE) injection 10-40 mg, 10-40 mg, Intravenous, Q10 min PRN, Consuella Lose, MD;  levETIRAcetam (KEPPRA) tablet 500 mg, 500 mg, Oral, BID, Consuella Lose, MD, 500 mg at 04/11/14 0951 metFORMIN (GLUCOPHAGE-XR) 24 hr tablet 1,000  mg, 1,000 mg, Oral, Q supper, Consuella Lose, MD, 1,000 mg at 04/10/14 1752;  metFORMIN (GLUCOPHAGE-XR) 24 hr tablet 500 mg, 500 mg, Oral, BID WC, Consuella Lose, MD, 500 mg at 04/11/14 0951;  morphine 2 MG/ML injection 1-2 mg, 1-2 mg, Intravenous, Q2H PRN, Consuella Lose, MD;  multivitamin with minerals tablet 1 tablet, 1 tablet, Oral, Daily, Consuella Lose, MD, 1 tablet at 04/11/14 0950 ondansetron (ZOFRAN) tablet 4 mg, 4 mg, Oral, Q4H PRN **OR** ondansetron (ZOFRAN) injection 4 mg, 4 mg, Intravenous, Q4H PRN, Consuella Lose, MD;  pantoprazole (PROTONIX) EC tablet 40 mg, 40 mg, Oral, QHS, Consuella Lose, MD, 40 mg at 04/10/14 2159;  pravastatin (PRAVACHOL) tablet 40 mg, 40 mg, Oral, q1800, Consuella Lose, MD, 40 mg at 04/10/14 1752 promethazine (PHENERGAN) tablet 12.5-25 mg, 12.5-25 mg, Oral, Q4H PRN, Consuella Lose, MD;  senna (SENOKOT) tablet 8.6 mg, 1 tablet, Oral, BID, Consuella Lose, MD, 8.6 mg at 04/11/14 0950  Patients Current Diet: Diet Carb Modified  Precautions / Restrictions Precautions Precautions: Fall Restrictions Weight Bearing Restrictions: No   Prior Activity Level Community (5-7x/wk): Went out daily   Development worker, international aid / [place] Devices/Equipment: None Home Equipment: None  Prior Functional Level Prior Function Level of Independence: Independent 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  Current Functional Level Cognition  Overall Cognitive Status: No family/caregiver present to determine baseline cognitive functioning Orientation Level: Oriented X4    Extremity Assessment (includes Sensation/Coordination)  Upper Extremity Assessment: Defer to OT evaluation;Overall WFL for tasks assessed Lower Extremity Assessment: LLE deficits/detail LLE Deficits / Details: hip flexion 2+, knee extension 1+, dorsiflexion 1+; pt reports episodes of extensor tone/spasms Cervical / Trunk Assessment: Kyphotic   ADLs  Overall ADL's : Needs  assistance/impaired Eating/Feeding: Independent, Sitting Grooming: Wash/dry hands, Minimal assistance, Standing Upper Body Bathing: Sitting, Moderate assistance Lower Body Bathing: Maximal assistance, Sit to/from stand Upper Body Dressing : Sitting, Moderate assistance Lower Body Dressing: Supervision/safety, Sitting/lateral leans Lower Body Dressing Details (indicate cue type and reason): donned socks in sitting, used hands to place L foot atop R knee General ADL Comments: No LOB with wide range reach and LB dressing in sitting.  Pt stood at sink x 2 with min assist to maintain balance while reaching for various items and performed hand washing. Verbal cues for posture.    Mobility  Overal bed mobility: Needs Assistance Bed Mobility: Supine to Sit Supine to sit: Mod assist General bed mobility comments: HOB 0 no rail; pt allowed to problem-solve his way to get to EOB; he used his RUE to assist his LLE over EOB, multiple losses of balance posteriorly and to the right (especially if he lifted Rt hand off the mattress) required  assist to prevent fall    Transfers  Overall transfer level: Needs assistance Equipment used: Rolling walker (2 wheeled) Transfers: Sit to/from Stand Sit to Stand: Mod assist, Min assist Squat pivot transfers: Mod assist General transfer comment: stood with R hand on sink and L on the arm of the chair.    Ambulation / Gait / Stairs / Wheelchair Mobility  Ambulation/Gait Ambulation/Gait assistance: Mod assist Ambulation Distance (Feet): 4 Feet Assistive device: Rolling walker (2 wheeled) Gait Pattern/deviations: Step-to pattern, Decreased step length - right, Decreased step length - left, Decreased stance time - left, Decreased dorsiflexion - left, Decreased weight shift to left Gait velocity interpretation: <1.8 ft/sec, indicative of risk for recurrent falls General Gait Details: 50% of steps required assist to advance LLE; pt using combination of hip hike and  ?hip flexion to advance LLE; ankle tending to supinate and required incr time to achieve foot flat prior to advancing RLE; as pt fatigued, developed flexor tone in LLE with inability to maintain Lt foot on floor and RLE weak with difficulty maintaining extension; BSC pulled behind pt and assisted to sit to avoid fall    Posture / Balance Overall balance assessment: Needs assistance Sitting-balance support: Bilateral upper extremity supported;Feet supported Sitting balance-Leahy Scale: Poor Postural control: Posterior lean;Right lateral lean Standing balance support: Bilateral upper extremity supported Standing balance-Leahy Scale: Poor    Special needs/care consideration BiPAP/CPAP No CPM No Continuous Drip IV 0.9% NS 100 ml/hr Dialysis No      Life Vest No Oxygen No Special Bed No Trach Size No Wound Vac (area) No   Skin No                               Bowel mgmt: Last BM 04/10/14 Bladder mgmt: Condom catheter Diabetic mgmt Yes, on oral medication at home    Previous Home Environment Living Arrangements: Spouse/significant other Available Help at Discharge: Family, Friend(s), Available 24 hours/day Type of Home: House Home Layout: One level Home Access: Stairs to enter Entrance Stairs-Rails: None Technical brewer of Steps: 3 Bathroom Toilet: Standard Home Care Services: No  Discharge Living Setting Plans for Discharge Living Setting: Patient's home, House, Lives with (comment) (Lives with wife.) Type of Home at Discharge: House Discharge Home Layout: One level Discharge Home Access: Stairs to enter Entrance Stairs-Number of Steps: 3 Does the patient have any problems obtaining your medications?: No  Social/Family/Support Systems Patient Roles: Spouse, Parent Contact Information: Javeion Cannedy - wife Anticipated Caregiver: wife Anticipated Caregiver's Contact Information: Curt Bears - wife [phone] (c) [phone] Ability/Limitations of Caregiver: Wife works  PT as a Oceanographer but plans to not work while patient is recovering. Caregiver Availability: 24/7 Discharge Plan Discussed with Primary Caregiver: Yes Is Caregiver In Agreement with Plan?: Yes Does Caregiver/Family have Issues with Lodging/Transportation while Pt is in Rehab?: No  Goals/Additional Needs Patient/Family Goal for Rehab: PT/OT mod I goals Expected length of stay: 10-14 days Cultural Considerations: [employer] Dietary Needs: Carb mod med cal thin liquids Equipment Needs: TBD Pt/Family Agrees to Admission and willing to participate: Yes Program Orientation Provided & Reviewed with Pt/Caregiver Including Roles  & Responsibilities: Yes  Decrease burden of Care through IP rehab admission:  N/A  Possible need for SNF placement upon discharge: Not anticipated  Patient Condition: This patient's medical and functional status has changed since the consult dated: 04/09/14 in which the Rehabilitation Physician determined and documented that the patient's condition is appropriate for  intensive rehabilitative care in an inpatient rehabilitation facility. See "History of Present Illness" (above) for medical update. Functional changes are: Currently requiring mod/max assist for transfers and mod assist to ambulate 4 ft RW. Patient's medical and functional status update has been discussed with the Rehabilitation physician and patient remains appropriate for inpatient rehabilitation. Will admit to inpatient rehab today.  Preadmission Screen Completed By:  Retta Diones, 04/11/2014 11:35 AM ______________________________________________________________________   Discussed status with Dr. Naaman Plummer on 04/11/14 at 1135 and received telephone approval for admission today.  Admission Coordinator:  Retta Diones, time1135/Date11/04/15

## 2014-04-10 LAB — GLUCOSE, CAPILLARY
GLUCOSE-CAPILLARY: 248 mg/dL — AB (ref 70–99)
GLUCOSE-CAPILLARY: 164 mg/dL — AB (ref 70–99)
Glucose-Capillary: 209 mg/dL — ABNORMAL HIGH (ref 70–99)
Glucose-Capillary: 251 mg/dL — ABNORMAL HIGH (ref 70–99)

## 2014-04-10 LAB — TYPE AND SCREEN
ABO/RH(D): A POS
Antibody Screen: NEGATIVE
UNIT DIVISION: 0
Unit division: 0

## 2014-04-10 MED ORDER — INSULIN DETEMIR 100 UNIT/ML ~~LOC~~ SOLN
15.0000 [IU] | Freq: Every day | SUBCUTANEOUS | Status: DC
  Administered 2014-04-10: 15 [IU] via SUBCUTANEOUS
  Filled 2014-04-10 (×2): qty 0.15

## 2014-04-10 MED ORDER — LEVETIRACETAM 500 MG PO TABS
500.0000 mg | ORAL_TABLET | Freq: Two times a day (BID) | ORAL | Status: DC
  Administered 2014-04-10 – 2014-04-11 (×3): 500 mg via ORAL
  Filled 2014-04-10 (×3): qty 1

## 2014-04-10 MED ORDER — DEXAMETHASONE 4 MG PO TABS
4.0000 mg | ORAL_TABLET | Freq: Four times a day (QID) | ORAL | Status: DC
  Administered 2014-04-10: 4 mg via ORAL
  Filled 2014-04-10: qty 1

## 2014-04-10 MED ORDER — DEXAMETHASONE 4 MG PO TABS
4.0000 mg | ORAL_TABLET | Freq: Two times a day (BID) | ORAL | Status: DC
  Administered 2014-04-10 – 2014-04-11 (×2): 4 mg via ORAL
  Filled 2014-04-10 (×2): qty 1

## 2014-04-10 NOTE — Progress Notes (Signed)
Physical Therapy Treatment Patient Details Name: Ivan Stark MRN: [id number] DOB: 1946-01-20 Today's Date: 04/10/2014    History of Present Illness Adm 04/06/14 for bilateral frontal-parietal craniotomy. PMHx- HTN, DM    PT Comments    Pt remains very motivated and positive concerning his progress. No incr in voluntary movement of LLE, however as he fatigued (especially after having a BM) noted incr flexor tone in LLE with decr safety in standing. Pt remains slightly impulsive, and by end of session he was able to state "I know I can't get up by myself or I will fall." Continues to be an excellent inpatient rehab candidate.    Follow Up Recommendations  CIR     Equipment Recommendations  Rolling walker with 5" wheels    Recommendations for Other Services Rehab consult;OT consult;Speech consult     Precautions / Restrictions Precautions Precautions: Fall Restrictions Weight Bearing Restrictions: No    Mobility  Bed Mobility Overal bed mobility: Needs Assistance Bed Mobility: Supine to Sit     Supine to sit: Mod assist     General bed mobility comments: HOB 0 no rail; pt allowed to problem-solve his way to get to EOB; he used his RUE to assist his LLE over EOB, multiple losses of balance posteriorly and to the right (especially if he lifted Rt hand off the mattress) required assist to prevent fall  Transfers Overall transfer level: Needs assistance Equipment used: Rolling walker (2 wheeled) Transfers: Sit to/from Stand Sit to Stand: Max assist;Mod assist         General transfer comment: pt impulsively trying to stand prior to therapist prepared; 3 failed attempts at standing with pt having difficulty transitioning hands from surface to RW; successful x 3 with repeated cues for sequencing and assist for weight shift forward over his feet and extend LLE  Ambulation/Gait Ambulation/Gait assistance: Mod assist Ambulation Distance (Feet): 4 Feet Assistive device:  Rolling walker (2 wheeled) Gait Pattern/deviations: Step-to pattern;Decreased step length - right;Decreased step length - left;Decreased stance time - left;Decreased dorsiflexion - left;Decreased weight shift to left   Gait velocity interpretation: <1.8 ft/sec, indicative of risk for recurrent falls General Gait Details: 50% of steps required assist to advance LLE; pt using combination of hip hike and ?hip flexion to advance LLE; ankle tending to supinate and required incr time to achieve foot flat prior to advancing RLE; as pt fatigued, developed flexor tone in LLE with inability to maintain Lt foot on floor and RLE weak with difficulty maintaining extension; BSC pulled behind pt and assisted to sit to avoid fall   Stairs            Wheelchair Mobility    Modified Rankin (Stroke Patients Only)       Balance   Sitting-balance support: Bilateral upper extremity supported Sitting balance-Leahy Scale: Poor   Postural control: Posterior lean Standing balance support: Bilateral upper extremity supported Standing balance-Leahy Scale: Poor                      Cognition Arousal/Alertness: Awake/alert Behavior During Therapy: Impulsive Overall Cognitive Status: No family/caregiver present to determine baseline cognitive functioning                      Exercises Other Exercises Other Exercises: Pt reports he worked on his ROM exercises last evening and again this morning when he awakened Other Exercises: PROM to LLE with incr tone noted in plantarflexors (with DF to neutral only);  flexor tone in knee flexors Other Exercises: facilitation to try to elicit voluntary contractions at Lt hip, knee, and ankle in sitting and standing    General Comments        Pertinent Vitals/Pain      Home Living                      Prior Function            PT Goals (current goals can now be found in the care plan section) Acute Rehab PT Goals Patient Stated  Goal: to walk again Progress towards PT goals: Progressing toward goals    Frequency  Min 4X/week    PT Plan Current plan remains appropriate    Co-evaluation             End of Session Equipment Utilized During Treatment: Gait belt Activity Tolerance: Patient limited by fatigue Patient left: in chair;with call bell/phone within reach;with chair alarm set     Time: [id number] PT Time Calculation (min): 41 min  Charges:  $Gait Training: 8-22 mins $Therapeutic Activity: 8-22 mins $Neuromuscular Re-education: 8-22 mins                    G Codes:      Caralee Morea 04-26-2014, 9:34 AM Pager [phone]

## 2014-04-10 NOTE — Progress Notes (Signed)
Occupational Therapy Treatment Patient Details Name: Ivan Stark MRN: [id number] DOB: 13-Apr-1946 Today's Date: 04/10/2014    History of present illness Adm 04/06/14 for bilateral frontal-parietal craniotomy. PMHx- HTN, DM   OT comments  Focus of session today on sit to stand and supported standing at sink. Pt also able to donn and doff his socks in sitting.  Pt with improved trunk control in sitting.  Remains highly motivated.  Instructed pt in the importance of rest to allow his brain to heal.  Pt has had many visitors despite his wife and nurses attempting to limit.  Follow Up Recommendations  CIR    Equipment Recommendations       Recommendations for Other Services      Precautions / Restrictions Precautions Precautions: Fall       Mobility Bed Mobility                  Transfers Overall transfer level: Needs assistance   Transfers: Sit to/from Stand Sit to Stand: Mod assist;Min assist         General transfer comment: stood with R hand on sink and L on the arm of the chair.    Balance     Sitting balance-Leahy Scale: Fair       Standing balance-Leahy Scale: Poor                     ADL Overall ADL's : Needs assistance/impaired     Grooming: Wash/dry hands;Minimal assistance;Standing               Lower Body Dressing: Supervision/safety;Sitting/lateral leans Lower Body Dressing Details (indicate cue type and reason): donned socks in sitting, used hands to place L foot atop R knee               General ADL Comments: No LOB with wide range reach and LB dressing in sitting.  Pt stood at sink x 2 with min assist to maintain balance while reaching for various items and performed hand washing. Verbal cues for posture.      Vision                     Perception     Praxis      Cognition   Behavior During Therapy: Impulsive                         Extremity/Trunk Assessment                Exercises     Shoulder Instructions       General Comments      Pertinent Vitals/ Pain       Pain Assessment: No/denies pain  Home Living                                          Prior Functioning/Environment              Frequency Min 3X/week     Progress Toward Goals  OT Goals(current goals can now be found in the care plan section)  Progress towards OT goals: Progressing toward goals  Acute Rehab OT Goals Patient Stated Goal: to walk again Time For Goal Achievement: 04/23/14 Potential to Achieve Goals: Good  Plan Discharge plan remains appropriate    Co-evaluation  End of Session Equipment Utilized During Treatment: Gait belt   Activity Tolerance Patient limited by fatigue   Patient Left in chair;with call bell/phone within reach;with family/visitor present;with chair alarm set   Nurse Communication          Time: 1232-1300 OT Time Calculation (min): 28 min  Charges: OT General Charges $OT Visit: 1 Procedure OT Treatments $Self Care/Home Management : 23-37 mins  Malka So 04/10/2014, 1:06 PM  [phone]

## 2014-04-10 NOTE — Progress Notes (Signed)
Inpatient Diabetes Program Recommendations  AACE/ADA: New Consensus Statement on Inpatient Glycemic Control (2013)  Target Ranges:  Prepandial:   less than 140 mg/dL      Peak postprandial:   less than 180 mg/dL (1-2 hours)      Critically ill patients:  140 - 180 mg/dL   Results for ERNESTINE, LANGWORTHY (MRN [id number]) as of 04/10/2014 11:37  Ref. Range 04/09/2014 07:26 04/09/2014 11:36 04/09/2014 16:15 04/09/2014 22:01 04/10/2014 06:50  Glucose-Capillary Latest Range: 70-99 mg/dL 238 (H) 227 (H) 188 (H) 189 (H) 209 (H)    Diabetes history: Type 2 diabetes Outpatient Diabetes medications: Amaryl 2 mg bid, Metformin 500 mg tid with meals Current orders for Inpatient glycemic control:  Amaryl 2 mg bid, Metformin 500 mg with breakfast and 1500 mg with supper, Novolog resistant tid with meals  Patient is also on IV Decadron which is likely increasing CBG's.   May consider adding Levemir 15 units daily while on steroids.  Gentry Fitz, RN, BA, MHA, CDE Diabetes Coordinator Inpatient Diabetes Program  [phone] (Team Pager) [phone] [facility]) 04/10/2014 11:39 AM

## 2014-04-10 NOTE — Progress Notes (Signed)
No issues overnight. Pt feeling well, no HA. Reports unchanged LLE strength. Able to stand and walk a little bit with PT and rolling walker.  EXAM:  BP 141/67 mmHg  Pulse 67  Temp(Src) 98.7 F (37.1 C) (Oral)  Resp 20  Ht 6\' 4"  (1.93 m)  Wt 94.8 kg (208 lb 15.9 oz)  BMI 25.45 kg/m2  SpO2 100%  Awake, alert, oriented  Speech fluent, appropriate  CN grossly intact  5/5 BUE/RLE  3/5 proximal LLE, 0-1/5 distal LLE Wound c/d/i  IMPRESSION:  68 y.o. male POD# 4 s/p crani for bilateral meningiomas Slightly improving RLE strength since immediate postop  PLAN: - Cont mobilization with PT/OT - Plan on transfer to CIR when insurance approved - Will add levemir 15Units daily while on steroids - Will begin to taper decadron

## 2014-04-10 NOTE — Progress Notes (Signed)
Rehab admissions - I continue to wait for a response from [facility] carrier regarding possible inpatient rehab admission.  I will let all know once I hear back from insurance case manager.  Call me for questions.  #[phone]

## 2014-04-10 NOTE — H&P (Signed)
Physical Medicine and Rehabilitation Admission H&P    CC: Left leg weakness and difficulty walking.   HPI:   Ivan Stark is a 68 y.o. H/o DM type 2, HTN, gait instability with LLE weakness  since July 2015 and MRI of brain revealed three intracranial tumors consistent with meningioma and right sided lesion with edema likely responsible for symptoms. He was evaluated by Dr. Kathyrn Sheriff and admitted on 04/06/14 for stereotactic bilateral frontoparietal craniotomy with resection of left and right frontal tumor. Post op leg weakness treated with decadron. 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 He has had elevated BS due to steroids and was started on levemir as well as decadron taper. He has had some improvement in LE strength and therapy ongoing. 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  Patient with flexor tone LLE as well as poor safety and impulsivity. CIR recommended by Rehab team and MD and patient ready for admission today.   Review of Systems  HENT: Negative for hearing loss.   Eyes: Negative for blurred vision and double vision.  Respiratory: Negative for cough and shortness of breath.   Cardiovascular: Negative for chest pain and palpitations.  Gastrointestinal: Positive for constipation. Negative for heartburn and nausea.  Genitourinary: Negative for dysuria and urgency.  Musculoskeletal: Negative for myalgias, back pain and joint pain.  Neurological: Positive for tingling (bilateral hands) and focal weakness (LLE). Negative for dizziness and headaches.  Psychiatric/Behavioral: The patient is nervous/anxious (due to steriods) and has insomnia.       Past Medical History  Diagnosis Date  . Diabetes mellitus without complication   . Hypertension   . Hyperlipidemia   . GERD (gastroesophageal reflux disease)   .  Vitamin D deficiency   . History of colon polyps   . Complication of anesthesia     Difficult to wake up; and severe nausea and vomiting  . PONV (postoperative nausea and vomiting)     Past Surgical History  Procedure Laterality Date  . Tonsillectomy    . Colonoscopy w/ polypectomy    . Esophagus surgery      Had esophagus stretched due to food getting trapped in his throat  . Craniotomy Bilateral 04/06/2014    Procedure: Bilateral Fronto-parietal craniotomy for resection of tumors with Curve;  Surgeon: Consuella Lose, MD;  Location: [facility];  Service: Neurosurgery;  Laterality: Bilateral;  Bilateral Fronto-parietal craniotomy for resection of tumors with Curve    Family History  Problem Relation Age of Onset  . Goiter Mother   . Hypertension Father   . Cancer Father     colon    Social History:  Married. Still works --Artist.  He reports that he has never smoked. He does not have any smokeless tobacco history on file. He reports that he does not drink alcohol or use illicit drugs.   Allergies  Allergen Reactions  . Atenolol Shortness Of Breath  . Lipitor [Atorvastatin]     Itch   Medications Prior to Admission  Medication Sig Dispense Refill  . Ascorbic Acid (VITAMIN C) 1000 MG tablet Take 1,000 mg by mouth daily.    Marland Kitchen aspirin EC 81 MG tablet Take 81 mg by mouth daily.    . Cholecalciferol (VITAMIN D) 2000 UNITS CAPS Take 4,000 Units by mouth daily.     Marland Kitchen dexamethasone (DECADRON) 4 MG tablet Take  4 mg by mouth 2 (two) times daily.    . enalapril (VASOTEC) 20 MG tablet Take 20 mg by mouth daily.    Marland Kitchen glimepiride (AMARYL) 4 MG tablet Take 2 mg by mouth 2 (two) times daily.    . metFORMIN (GLUCOPHAGE-XR) 500 MG 24 hr tablet Take 500 mg by mouth 3 (three) times daily. 500mg  in a.m., 500mg . At lunch, 1000mg . In the evening    . Multiple Vitamin (MULTIVITAMIN WITH MINERALS) TABS Take 1 tablet by mouth daily.    . pravastatin (PRAVACHOL) 40 MG tablet Take 1  tablet (40 mg total) by mouth at bedtime. 30 tablet 1  . 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 Status:  Mobility: Bed Mobility Overal bed mobility: Needs Assistance Bed Mobility: Supine to Sit Supine to sit: Mod assist General bed mobility comments: pt up in chair Transfers Overall transfer level: Needs assistance Equipment used: Rolling walker (2 wheeled) Transfers: Sit to/from Stand Sit to Stand: Min assist General transfer comment: verbal cues for hand placement and technique, leaning to R initially, heavy reliance on UEs Ambulation/Gait Ambulation/Gait assistance: Mod assist Ambulation Distance (Feet): 3 Feet Assistive device: Rolling walker (2 wheeled) Gait Pattern/deviations: Step-to pattern, Decreased step length - left, Decreased dorsiflexion - left, Decreased weight shift to left (Lt hip hike) Gait velocity interpretation: <1.8 ft/sec, indicative of risk for recurrent falls General Gait Details: pt able to advance LLE forward using hip hike; required assist to step backwards with LLE; incr bil UE support with no Lt knee buckling, however do not feel pt was at any time placing full weight on the LLE    ADL: ADL Overall ADL's : Needs assistance/impaired Eating/Feeding: Independent, Sitting Grooming: Wash/dry hands, Wash/dry face, Oral care, Sitting Upper Body Bathing: Sitting, Moderate assistance Lower Body Bathing: Maximal  assistance, Sit to/from stand Upper Body Dressing : Sitting, Moderate assistance Lower Body Dressing: Maximal assistance, Sit to/from stand General ADL Comments: Pt with decreased sitting balance interfering with bathing and dressing.  Posterior and R lean.  Effective use of hands requires supported sitting in recliner.  Cognition: Cognition Overall Cognitive Status: Within Functional Limits for tasks assessed Orientation Level: Oriented X4 Cognition Arousal/Alertness: Awake/alert Behavior During Therapy: WFL for tasks assessed/performed Overall Cognitive Status: Within Functional Limits for tasks assessed  Physical Exam: Blood pressure 143/81, pulse 73, temperature 98.1 F (36.7 C), temperature source Oral, resp. rate 20, height 6\' 4"  (1.93 m), weight 94.8 kg (208 lb 15.9 oz), SpO2 95 %. Physical Exam Vitals reviewed. Constitutional: He is oriented to person, place, and time. He appears well-developed.  HENT: oral mucosa pink and moist Craniotomy site with staples intact.  Eyes: EOM are normal.  Neck: Normal range of motion. Neck supple. No thyromegaly present.  Cardiovascular: Normal rate and regular rhythm.  Respiratory: Effort normal and breath sounds normal. No respiratory distress.  GI: Soft. Bowel sounds are normal. He exhibits no distension.  Neurological: He is alert and oriented to person, place, and time.  Follows commands. Good insight and awareness. Memory  intact. Language normal.no CN deficits.  Right upper extremity 5/5 deltoid, biceps, triceps, grip, left upper extremity 4+/5 in the deltoid, bicep, tricep, grip. 4/5 right hip flexor knee extensor and ankle dorsiflexor plantar flexor. Left lower extremity  Trace to 1/5 hip flexor knee extensor, 0/5 ankle dorsiflexor plantar flexor Sensation sl diminished to LT left arm and leg. Mood and affect are appropriate No   left neglect. No abnormal tone.  Skin: Skin is warm and dry.  Psych: mood pleasant and up  beat   Results for orders placed or performed during the hospital encounter of 04/06/14 (from the past 48 hour(s))  Glucose, capillary     Status: Abnormal   Collection Time: 04/08/14 11:29 AM  Result Value Ref Range   Glucose-Capillary 288 (H) 70 - 99 mg/dL  Glucose, capillary     Status: Abnormal   Collection Time: 04/08/14  4:46 PM  Result Value Ref Range   Glucose-Capillary 296 (H) 70 - 99 mg/dL  Glucose, capillary     Status: Abnormal   Collection Time: 04/08/14 10:36 PM  Result Value Ref Range   Glucose-Capillary 122 (H) 70 - 99 mg/dL   Comment 1 Notify RN    Comment 2 Documented in Chart   Glucose, capillary     Status: Abnormal   Collection Time: 04/09/14  7:26 AM  Result Value Ref Range   Glucose-Capillary 238 (H) 70 - 99 mg/dL   Comment 1 Documented in Chart    Comment 2 Notify RN   Glucose, capillary     Status: Abnormal   Collection Time: 04/09/14 11:36 AM  Result Value Ref Range   Glucose-Capillary 227 (H) 70 - 99 mg/dL   Comment 1 Documented in Chart   Glucose, capillary     Status: Abnormal   Collection Time: 04/09/14  4:15 PM  Result Value Ref Range   Glucose-Capillary 188 (H) 70 - 99 mg/dL  Glucose, capillary     Status: Abnormal   Collection Time: 04/09/14 10:01 PM  Result Value Ref Range   Glucose-Capillary 189 (H) 70 - 99 mg/dL   Comment 1 Documented in Chart    Comment 2 Notify RN   Glucose, capillary     Status: Abnormal   Collection Time: 04/10/14  6:50 AM  Result Value Ref Range   Glucose-Capillary 209 (H) 70 - 99 mg/dL   Comment 1 Documented in Chart    Comment 2 Notify RN    No results found.     Medical Problem List and Plan: 1. Functional deficits secondary to Right frontal meningioma status post resection with left hemiparesis 2.  DVT Prophylaxis/Anticoagulation: Pharmaceutical: Lovenox--cleared to initiate per Dr. Kathyrn Sheriff 3. Pain Management: Will continue hydrocodone prn.  4. Mood: Reports anxiety/restlessness due to steroids.  Will add low dose xanax prn. LCSW to follow for evaluation and support.  5. Neuropsych: This patient is capable of making decisions on *his own behalf. 6. Skin/Wound Care: Routine pressure relief measures.  7. Fluids/Electrolytes/Nutrition: Monitor intake. Offer supplements as needed to maintain adequate intake.  8. DM type 2:  Monitor BS ac/hs and continue Levemir for now. ON amaryl and metformin bid additionally.  9. Seizure prophylaxis: Continue Keppra bid.  10. HTN: Continue vasotec daily. Monitor BP every 8 hours and adjust as needed.  11. Metastatic Lung cancer: Patient aware and Hem/onc to be consulted for work up and input.   Post Admission Physician Evaluation: 1. Functional deficits secondary  to right frontal meningioma, metastatic lung cancer with left  hemiparesis 2. Patient is admitted to receive collaborative, interdisciplinary care between the physiatrist, rehab nursing staff, and therapy team. 3. Patient's level of medical complexity and substantial therapy needs in context of that medical necessity cannot be provided at a lesser intensity of care such as a SNF. 4. Patient has experienced substantial functional loss from his/her baseline which was documented above under the "Functional History" and "Functional Status" headings.  Judging by the patient's diagnosis, physical exam, and functional history, the patient has potential for functional progress which will result in measurable gains while on inpatient rehab.  These gains will be of substantial and practical use upon discharge  in facilitating mobility and self-care at the household level. 5. Physiatrist will provide 24 hour management of medical needs as well as oversight of the therapy plan/treatment and provide guidance as appropriate regarding the interaction of the two. 6. 24 hour rehab nursing will assist with bladder management, bowel management, safety, skin/wound care, disease management, medication administration, pain  management and patient education  and help integrate therapy concepts, techniques,education, etc. 7. PT will assess and treat for/with: Lower extremity strength, range of motion, stamina, balance, functional mobility, safety, adaptive techniques and equipment, NMR, ego support, orthotic fitting, pain control, family education.   Goals are: mod I to supervision. 8. OT will assess and treat for/with: ADL's, functional mobility, safety, upper extremity strength, adaptive techniques and equipment, NMR, ego support, community reintegration.   Goals are: mod I. Therapy may not yet proceed with showering this patient. 9. SLP will assess and treat for/with: n/a.  Goals are: n/a. 10. Case Management and Social Worker will assess and treat for psychological issues and discharge planning. 11. Team conference will be held weekly to assess progress toward goals and to determine barriers to discharge. 12. Patient will receive at least 3 hours of therapy per day at least 5 days per week. 13. ELOS: 16-20 days   14. Prognosis:  good     Meredith Staggers, MD, [facility] 04/11/2014

## 2014-04-11 ENCOUNTER — Inpatient Hospital Stay (HOSPITAL_COMMUNITY)
Admission: RE | Admit: 2014-04-11 | Discharge: 2014-04-28 | DRG: 055 | Disposition: A | Discharge status: Home Health | Payer: Medicare Other | Source: Intra-hospital | Attending: Physical Medicine & Rehabilitation | Admitting: Physical Medicine & Rehabilitation

## 2014-04-11 DIAGNOSIS — C7931 Secondary malignant neoplasm of brain: Secondary | ICD-10-CM | POA: Diagnosis present

## 2014-04-11 DIAGNOSIS — C61 Malignant neoplasm of prostate: Secondary | ICD-10-CM | POA: Diagnosis present

## 2014-04-11 DIAGNOSIS — C801 Malignant (primary) neoplasm, unspecified: Secondary | ICD-10-CM

## 2014-04-11 DIAGNOSIS — E785 Hyperlipidemia, unspecified: Secondary | ICD-10-CM | POA: Diagnosis present

## 2014-04-11 DIAGNOSIS — T380X5A Adverse effect of glucocorticoids and synthetic analogues, initial encounter: Secondary | ICD-10-CM | POA: Diagnosis present

## 2014-04-11 DIAGNOSIS — D125 Benign neoplasm of sigmoid colon: Secondary | ICD-10-CM | POA: Diagnosis present

## 2014-04-11 DIAGNOSIS — R451 Restlessness and agitation: Secondary | ICD-10-CM | POA: Diagnosis present

## 2014-04-11 DIAGNOSIS — G8194 Hemiplegia, unspecified affecting left nondominant side: Secondary | ICD-10-CM | POA: Diagnosis present

## 2014-04-11 DIAGNOSIS — F419 Anxiety disorder, unspecified: Secondary | ICD-10-CM | POA: Diagnosis present

## 2014-04-11 DIAGNOSIS — D329 Benign neoplasm of meninges, unspecified: Secondary | ICD-10-CM | POA: Diagnosis present

## 2014-04-11 DIAGNOSIS — K5900 Constipation, unspecified: Secondary | ICD-10-CM | POA: Diagnosis not present

## 2014-04-11 DIAGNOSIS — I10 Essential (primary) hypertension: Secondary | ICD-10-CM | POA: Diagnosis present

## 2014-04-11 DIAGNOSIS — H539 Unspecified visual disturbance: Secondary | ICD-10-CM | POA: Diagnosis present

## 2014-04-11 DIAGNOSIS — E119 Type 2 diabetes mellitus without complications: Secondary | ICD-10-CM | POA: Diagnosis present

## 2014-04-11 DIAGNOSIS — D320 Benign neoplasm of cerebral meninges: Secondary | ICD-10-CM

## 2014-04-11 DIAGNOSIS — G8100 Flaccid hemiplegia affecting unspecified side: Secondary | ICD-10-CM

## 2014-04-11 LAB — GLUCOSE, CAPILLARY
GLUCOSE-CAPILLARY: 91 mg/dL (ref 70–99)
Glucose-Capillary: 92 mg/dL (ref 70–99)
Glucose-Capillary: 97 mg/dL (ref 70–99)
Glucose-Capillary: 97 mg/dL (ref 70–99)

## 2014-04-11 MED ORDER — DIPHENHYDRAMINE HCL 12.5 MG/5ML PO ELIX: 12.5000 mg | ORAL_SOLUTION | Freq: Four times a day (QID) | ORAL | Status: DC | PRN

## 2014-04-11 MED ORDER — ONDANSETRON HCL 4 MG/2ML IJ SOLN
4.0000 mg | INTRAMUSCULAR | Status: DC | PRN
  Administered 2014-04-19: 4 mg via INTRAVENOUS

## 2014-04-11 MED ORDER — LEVETIRACETAM 500 MG PO TABS
500.0000 mg | ORAL_TABLET | Freq: Two times a day (BID) | ORAL | Status: DC
  Administered 2014-04-11 – 2014-04-28 (×34): 500 mg via ORAL
  Filled 2014-04-11 (×36): qty 1

## 2014-04-11 MED ORDER — TRAZODONE HCL 50 MG PO TABS
25.0000 mg | ORAL_TABLET | Freq: Every evening | ORAL | Status: DC | PRN
  Administered 2014-04-13 – 2014-04-17 (×4): 50 mg via ORAL
  Filled 2014-04-11 (×4): qty 1

## 2014-04-11 MED ORDER — ENALAPRIL MALEATE 20 MG PO TABS
20.0000 mg | ORAL_TABLET | Freq: Every day | ORAL | Status: DC
  Administered 2014-04-12 – 2014-04-28 (×17): 20 mg via ORAL
  Filled 2014-04-11 (×18): qty 1

## 2014-04-11 MED ORDER — ALPRAZOLAM 0.25 MG PO TABS
0.2500 mg | ORAL_TABLET | Freq: Two times a day (BID) | ORAL | Status: DC | PRN
  Administered 2014-04-12: 0.25 mg via ORAL
  Filled 2014-04-11: qty 1

## 2014-04-11 MED ORDER — FLEET ENEMA 7-19 GM/118ML RE ENEM: 1.0000 | ENEMA | Freq: Once | RECTAL | Status: AC | PRN

## 2014-04-11 MED ORDER — GUAIFENESIN-DM 100-10 MG/5ML PO SYRP: 5.0000 mL | ORAL_SOLUTION | Freq: Four times a day (QID) | ORAL | Status: DC | PRN

## 2014-04-11 MED ORDER — ADULT MULTIVITAMIN W/MINERALS CH
1.0000 | ORAL_TABLET | Freq: Every day | ORAL | Status: DC
  Administered 2014-04-12 – 2014-04-28 (×17): 1 via ORAL
  Filled 2014-04-11 (×20): qty 1

## 2014-04-11 MED ORDER — SIMETHICONE 80 MG PO CHEW
80.0000 mg | CHEWABLE_TABLET | Freq: Four times a day (QID) | ORAL | Status: DC | PRN
  Filled 2014-04-11: qty 1

## 2014-04-11 MED ORDER — INSULIN DETEMIR 100 UNIT/ML ~~LOC~~ SOLN
15.0000 [IU] | Freq: Every day | SUBCUTANEOUS | Status: DC
  Administered 2014-04-13 – 2014-04-14 (×2): 15 [IU] via SUBCUTANEOUS
  Filled 2014-04-11 (×5): qty 0.15

## 2014-04-11 MED ORDER — ONDANSETRON HCL 4 MG PO TABS: 4.0000 mg | ORAL_TABLET | ORAL | Status: DC | PRN

## 2014-04-11 MED ORDER — ACETAMINOPHEN 325 MG PO TABS: 325.0000 mg | ORAL_TABLET | ORAL | Status: DC | PRN

## 2014-04-11 MED ORDER — SENNA 8.6 MG PO TABS
2.0000 | ORAL_TABLET | Freq: Two times a day (BID) | ORAL | Status: DC
  Administered 2014-04-11 – 2014-04-28 (×28): 17.2 mg via ORAL
  Filled 2014-04-11 (×36): qty 2

## 2014-04-11 MED ORDER — ALUM & MAG HYDROXIDE-SIMETH 200-200-20 MG/5ML PO SUSP: 30.0000 mL | ORAL | Status: DC | PRN

## 2014-04-11 MED ORDER — DEXAMETHASONE 4 MG PO TABS
4.0000 mg | ORAL_TABLET | Freq: Two times a day (BID) | ORAL | Status: DC
  Administered 2014-04-11 – 2014-04-12 (×2): 4 mg via ORAL
  Filled 2014-04-11 (×4): qty 1

## 2014-04-11 MED ORDER — ENOXAPARIN SODIUM 40 MG/0.4ML ~~LOC~~ SOLN
40.0000 mg | SUBCUTANEOUS | Status: DC
  Administered 2014-04-11 – 2014-04-27 (×16): 40 mg via SUBCUTANEOUS
  Filled 2014-04-11 (×18): qty 0.4

## 2014-04-11 MED ORDER — METFORMIN HCL ER 500 MG PO TB24
500.0000 mg | ORAL_TABLET | Freq: Two times a day (BID) | ORAL | Status: DC
  Administered 2014-04-12 (×2): 500 mg via ORAL
  Filled 2014-04-11 (×5): qty 1

## 2014-04-11 MED ORDER — HYDROCODONE-ACETAMINOPHEN 5-325 MG PO TABS
1.0000 | ORAL_TABLET | ORAL | Status: DC | PRN
  Administered 2014-04-16 – 2014-04-24 (×3): 1 via ORAL
  Filled 2014-04-11 (×5): qty 1

## 2014-04-11 MED ORDER — PANTOPRAZOLE SODIUM 40 MG PO TBEC
40.0000 mg | DELAYED_RELEASE_TABLET | Freq: Every day | ORAL | Status: DC
  Administered 2014-04-11 – 2014-04-27 (×17): 40 mg via ORAL
  Filled 2014-04-11 (×19): qty 1

## 2014-04-11 MED ORDER — BISACODYL 10 MG RE SUPP: 10.0000 mg | Freq: Every day | RECTAL | Status: DC | PRN

## 2014-04-11 MED ORDER — INSULIN ASPART 100 UNIT/ML ~~LOC~~ SOLN
0.0000 [IU] | Freq: Three times a day (TID) | SUBCUTANEOUS | Status: DC
Start: 2014-04-11 — End: 2014-04-23
  Administered 2014-04-12 (×2): 3 [IU] via SUBCUTANEOUS
  Administered 2014-04-13: 4 [IU] via SUBCUTANEOUS
  Administered 2014-04-14: 7 [IU] via SUBCUTANEOUS
  Administered 2014-04-15 (×2): 4 [IU] via SUBCUTANEOUS
  Administered 2014-04-16: 7 [IU] via SUBCUTANEOUS
  Administered 2014-04-16: 4 [IU] via SUBCUTANEOUS
  Administered 2014-04-17 – 2014-04-18 (×3): 3 [IU] via SUBCUTANEOUS
  Administered 2014-04-19: 11 [IU] via SUBCUTANEOUS
  Administered 2014-04-21 – 2014-04-23 (×2): 4 [IU] via SUBCUTANEOUS

## 2014-04-11 MED ORDER — GLIMEPIRIDE 2 MG PO TABS
2.0000 mg | ORAL_TABLET | Freq: Two times a day (BID) | ORAL | Status: DC
  Administered 2014-04-11 – 2014-04-15 (×8): 2 mg via ORAL
  Filled 2014-04-11 (×10): qty 1

## 2014-04-11 MED ORDER — METFORMIN HCL ER 500 MG PO TB24
1000.0000 mg | ORAL_TABLET | Freq: Every day | ORAL | Status: DC
  Administered 2014-04-11 – 2014-04-12 (×2): 1000 mg via ORAL
  Filled 2014-04-11 (×3): qty 2

## 2014-04-11 MED ORDER — PRAVASTATIN SODIUM 40 MG PO TABS
40.0000 mg | ORAL_TABLET | Freq: Every day | ORAL | Status: DC
  Administered 2014-04-11 – 2014-04-27 (×16): 40 mg via ORAL
  Filled 2014-04-11 (×18): qty 1

## 2014-04-11 NOTE — Progress Notes (Signed)
Expand All Collapse All        Physical Medicine and Rehabilitation Consult Reason for Consult:intracranial tumor Referring Physician: Dr. Kathyrn Stark   HPI: Ivan Stark is a 68 y.o.right handed male with history of diabetes mellitus peripheral neuropathy, hypertension. Admitted 04/06/2014 with instability of gait dating back to July drifting to the left. He also described dragging of his left leg. Denies any recent falls.MRI and imaging demonstrated multiple intracranial tumors consistent with meningioma.underwent stereotactic bilateral frontoparietal craniotomy and resection of left frontal extra-axial tumor as well as right frontal extra-axial tumor 04/06/2014 per Dr. Waynetta Stark on Decadron protocol. Keppra added for seizure prophylaxis. He is tolerating a regular diet.physical and occupational therapy evaluations pending. M.D. Has requested physical medicine rehabilitation consult.  Patient just finished physical therapy. Listed as moderate assist for ambulation 3 feet only, patient motivated  Review of Systems  Gastrointestinal: Positive for constipation.   GERD  Musculoskeletal: Positive for myalgias.  Neurological: Positive for weakness.  All other systems reviewed and are negative.  Past Medical History  Diagnosis Date  . Diabetes mellitus without complication   . Hypertension   . Hyperlipidemia   . GERD (gastroesophageal reflux disease)   . Vitamin D deficiency   . History of colon polyps   . Complication of anesthesia     Difficult to wake up; and severe nausea and vomiting  . PONV (postoperative nausea and vomiting)    Past Surgical History  Procedure Laterality Date  . Tonsillectomy    . Colonoscopy w/ polypectomy    . Esophagus surgery      Had esophagus stretched due to food getting trapped in his throat   Family History  Problem Relation Age of Onset  . Goiter Mother   . Hypertension  Father   . Cancer Father     colon   Social History:  reports that he has never smoked. He does not have any smokeless tobacco history on file. He reports that he does not drink alcohol or use illicit drugs. Allergies:  Allergies  Allergen Reactions  . Atenolol Shortness Of Breath  . Lipitor [Atorvastatin]     Itch   Medications Prior to Admission  Medication Sig Dispense Refill  . Ascorbic Acid (VITAMIN C) 1000 MG tablet Take 1,000 mg by mouth daily.    Marland Kitchen aspirin EC 81 MG tablet Take 81 mg by mouth daily.    . Cholecalciferol (VITAMIN D) 2000 UNITS CAPS Take 4,000 Units by mouth daily.     Marland Kitchen dexamethasone (DECADRON) 4 MG tablet Take 4 mg by mouth 2 (two) times daily.    . enalapril (VASOTEC) 20 MG tablet Take 20 mg by mouth daily.    Marland Kitchen glimepiride (AMARYL) 4 MG tablet Take 2 mg by mouth 2 (two) times daily.    . metFORMIN (GLUCOPHAGE-XR) 500 MG 24 hr tablet Take 500 mg by mouth 3 (three) times daily. 500mg  in a.m., 500mg . At lunch, 1000mg . In the evening    . Multiple Vitamin (MULTIVITAMIN WITH MINERALS) TABS Take 1 tablet by mouth daily.    . pravastatin (PRAVACHOL) 40 MG tablet Take 1 tablet (40 mg total) by mouth at bedtime. 30 tablet 1  . glimepiride (AMARYL) 4 MG tablet TAKE ONE-HALF TO ONE TABLET BY MOUTH TWICE DAILY WITH FOOD FOR DIABETES 60 tablet 0    Home: Home Living Family/patient expects to be discharged to:: Private residence Living Arrangements: Spouse/significant other, Children, Other relatives  Functional History:   Functional Status:  Mobility:  ADL:    Cognition: Cognition Orientation Level: Oriented X4    Blood pressure 128/66, pulse 64, temperature 98.2 F (36.8 C), temperature source Oral, resp. rate 18, height 6\' 4"  (1.93 m), weight 94.8 kg (208 lb 15.9 oz), SpO2 93 %. Physical Exam  Vitals reviewed. Constitutional: He is oriented to person, place, and  time. He appears well-developed.  HENT:  Craniotomy site dressed  Eyes: EOM are normal.  Neck: Normal range of motion. Neck supple. No thyromegaly present.  Cardiovascular: Normal rate and regular rhythm.  Respiratory: Effort normal and breath sounds normal. No respiratory distress.  GI: Soft. Bowel sounds are normal. He exhibits no distension.  Neurological: He is alert and oriented to person, place, and time.  Follows commands  Skin: Skin is warm and dry.  Right upper extremity 5/5 deltoid, biceps, triceps, grip, left upper extremity 4+/5 in the deltoid, bicep, tricep, grip 4/5 right hip flexor knee extensor and ankle dorsiflexor plantar flexor Left lower extremity 3 minus hip flexor knee extensor ankle dorsiflexor plantar flexor Sensation intact to light touch bilateral lower extremities Mood and affect are appropriate No evidence of left neglect   Lab Results Last 24 Hours    Results for orders placed or performed during the hospital encounter of 04/06/14 (from the past 24 hour(s))  Glucose, capillary Status: Abnormal   Collection Time: 04/08/14 8:08 AM  Result Value Ref Range   Glucose-Capillary 315 (H) 70 - 99 mg/dL  Glucose, capillary Status: Abnormal   Collection Time: 04/08/14 11:29 AM  Result Value Ref Range   Glucose-Capillary 288 (H) 70 - 99 mg/dL  Glucose, capillary Status: Abnormal   Collection Time: 04/08/14 4:46 PM  Result Value Ref Range   Glucose-Capillary 296 (H) 70 - 99 mg/dL  Glucose, capillary Status: Abnormal   Collection Time: 04/08/14 10:36 PM  Result Value Ref Range   Glucose-Capillary 122 (H) 70 - 99 mg/dL   Comment 1 Notify RN    Comment 2 Documented in Chart       Imaging Results (Last 48 hours)    Mr Ivan Stark Wo Contrast  04/07/2014 : CLINICAL DATA: Postop tumor resection 04/06/2014. Now with left leg weakness. Bilateral craniotomy for bilateral perirolandic meningioma  resection. EXAM: MRI HEAD WITHOUT AND WITH CONTRAST TECHNIQUE: Multiplanar, multiecho pulse sequences of the brain and surrounding structures were obtained without and with intravenous contrast. CONTRAST: 20 mL MultiHance IV COMPARISON: MRI 03/21/2014 FINDINGS: Postop day 1 bilateral parietal craniotomy for bilateral meningioma resection. Right parasagittal meningioma resection. Surgical cavity is filled with blood on the right. There is enhancement of the wall of the surgical cavity without masslike features. Although it is only been 24 hr, this enhancement pattern may be postop enhancement rather than residual tumor. Continued close follow-up recommended. Left parasagittal meningioma resection. Postop surgical cavity containing blood. Mild peripheral enhancement around the cavity similar that seen on the right may be postop enhancement. Diffusion-weighted imaging reveals several areas of restricted diffusion consistent with acute infarct. Small area of acute infarction in the right parietal cortex, several cm from the resection site. Small areas of restricted diffusion in the frontal lobes bilaterally compatible with small areas of infarction. Restricted diffusion is also present over the convexity bilaterally near the resection site most consistent with infarct adjacent to the tumor resection site. This measures approximately 10 x 15 mm on the right and 15 mm by 20 mm on the left. Calcified meningioma along the falx measuring approximate 1 cm is unchanged and was not resected.  Ventricle size is normal. No shift of the midline structures. IMPRESSION: Postop day 1 resection of bilateral parasagittal meningioma is in the parietal lobe. Post surgical cavity filled with blood bilaterally. There is thin linear enhancement along the wall of the surgical cavity bilaterally likely due to early postoperative enhancement. Continued followup is suggested to rule out residual tumor. Acute infarction adjacent to  the resection cavity bilaterally. Also remote small areas of infarct in the right parietal lobe and bilateral frontal lobes. Question emboli Electronically Signed By: Franchot Gallo M.D. On: 04/07/2014 20:44     Assessment/Plan: Diagnosis: Right frontal meningioma status post resection with left hemiparesis 1. Does the need for close, 24 hr/day medical supervision in concert with the patient's rehab needs make it unreasonable for this patient to be served in a less intensive setting? Yes 2. Co-Morbidities requiring supervision/potential complications: NIDDM, HTN 3. Due to bladder management, bowel management, safety, skin/wound care, disease management, medication administration, pain management and patient education, does the patient require 24 hr/day rehab nursing? Yes 4. Does the patient require coordinated care of a physician, rehab nurse, PT (1-2 hrs/day, 5 days/week) and OT (1-2 hrs/day, 55 days/week) to address physical and functional deficits in the context of the above medical diagnosis(es)? Yes Addressing deficits in the following areas: balance, endurance, locomotion, strength, transferring, bowel/bladder control, bathing, dressing and toileting 5. Can the patient actively participate in an intensive therapy program of at least 3 hrs of therapy per day at least 5 days per week? Yes 6. The potential for patient to make measurable gains while on inpatient rehab is excellent 7. Anticipated functional outcomes upon discharge from inpatient rehab are modified independent with PT, modified independent with OT, n/a with SLP. 8. Estimated rehab length of stay to reach the above functional goals is: 10-14d 9.  10. Does the patient have adequate social supports to accommodate these discharge functional goals? Yes 11. Anticipated D/C setting: Home 12. Anticipated post D/C treatments: [place] therapy 13. Overall Rehab/Functional Prognosis: excellent  RECOMMENDATIONS: This patient's condition  is appropriate for continued rehabilitative care in the following setting: CIR Patient has agreed to participate in recommended program. Yes Note that insurance prior authorization may be required for reimbursement for recommended care.  Comment:     04/09/2014

## 2014-04-11 NOTE — Progress Notes (Signed)
Report called to RN for inpatient rehab. Pt will be going to [id number]. All questions answered, but room is not clean at this time. Will call later.

## 2014-04-11 NOTE — Progress Notes (Signed)
Surgical pathology results were reviewed today demonstrating the right-sided tumor to be metastatic small-cell lung CA. I reviewed these results with the patient and his wife. Will consult oncology while patient is at [facility] and proceed with further w/u. I have already discussed this case with Rad Onc, and will plan on reviewing Ivan Stark case at the upcoming multidisciplinary neuro-oncology conference on Mon.

## 2014-04-11 NOTE — Progress Notes (Signed)
PMR Admission Coordinator Pre-Admission Assessment  Patient: Ivan Stark is an 68 y.o., male MRN: [id number] DOB: Nov 08, 1945 Height: 6' 4"  (193 cm) Weight: 94.8 kg (208 lb 15.9 oz)  Insurance Information HMO: Yes PPO: PCP: IPA: 80/20: OTHER: Group # [id number] PRIMARY: Blue Medicare Policy#: [id number] Subscriber: Jonah Blue CM Name: Josephina Gip Phone#: [phone] Fax#: [phone] Pre-Cert#: Employer: [employer] Benefits: Phone #: [phone] Name: Checked on line Eff. Date: 06/08/13 Deduct: $0 Out of Pocket Max: $4500 (met $278.23) Life Max: unlimited CIR: $250 days 1-6 SNF: $0 days 1-20; $60 days 21-100 Outpatient: medical necessity Co-Pay: $35/visit Home Health: 100% Co-Pay: none DME: 80% Co-Pay: 20% Providers: in network  Emergency Contact Information Contact Information    Name Relation Home Work Mobile   Wenz,[name] Spouse [phone]  [phone]     Current Medical History  Patient Admitting Diagnosis: Right frontal meningioma status post resection with left hemiparesis  History of Present Illness: A 68 y.o. H/o DM type 2, HTN, gait instability with LLE weakness since July 2015 and MRI of brain revealed three intracranial tumors consistent with meningioma and right sided lesion with edema likely responsible for symptoms. He was evaluated by Dr. Kathyrn Sheriff and admitted on 04/06/14 for stereotactic bilateral frontoparietal craniotomy with resection of left and right frontal tumor. Post op leg weakness treated with decadron. 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 He has had elevated  BS due to steroids and was started on levemir as well as decadron taper. He has had some improvement in LE strength and therapy ongoing. Patient with flexor tone LLE as well as poor safety and impulsivity. CIR recommended by Rehab team and MD and patient ready for admission today.    Past Medical History  Past Medical History  Diagnosis Date  . Diabetes mellitus without complication   . Hypertension   . Hyperlipidemia   . GERD (gastroesophageal reflux disease)   . Vitamin D deficiency   . History of colon polyps   . Complication of anesthesia     Difficult to wake up; and severe nausea and vomiting  . PONV (postoperative nausea and vomiting)     Family History  family history includes Cancer in his father; Goiter in his mother; Hypertension in his father.  Prior Rehab/Hospitalizations: No previous rehab admissions.  Current Medications  Current facility-administered medications: 0.9 % sodium chloride infusion, , Intravenous, Continuous, Consuella Lose, MD, Last Rate: 100 mL/hr at 04/09/14 0012; dexamethasone (DECADRON) tablet 4 mg, 4 mg, Oral, Q12H, Consuella Lose, MD, 4 mg at 04/11/14 0951; docusate sodium (COLACE) capsule 100 mg, 100 mg, Oral, BID, Consuella Lose, MD, 100 mg at 04/11/14 0950 enalapril (VASOTEC) tablet 20 mg, 20 mg, Oral, Daily, Consuella Lose, MD, 20 mg at 04/10/14 1005; glimepiride (AMARYL) tablet 2 mg, 2 mg, Oral, BID WC, Consuella Lose, MD, 2 mg at 04/11/14 0950; HYDROcodone-acetaminophen (NORCO/VICODIN) 5-325 MG per tablet 1 tablet, 1 tablet, Oral, Q4H PRN, Consuella Lose, MD, 1 tablet at 04/10/14 2340; insulin aspart (novoLOG) injection 0-20 Units, 0-20 Units, Subcutaneous, TID WC, Consuella Lose, MD insulin detemir (LEVEMIR) injection 15 Units, 15 Units, Subcutaneous, QHS, Consuella Lose, MD, 15 Units at 04/10/14 2159; labetalol (NORMODYNE,TRANDATE) injection 10-40 mg, 10-40 mg, Intravenous, Q10 min  PRN, Consuella Lose, MD; levETIRAcetam (KEPPRA) tablet 500 mg, 500 mg, Oral, BID, Consuella Lose, MD, 500 mg at 04/11/14 0951 metFORMIN (GLUCOPHAGE-XR) 24 hr tablet 1,000 mg, 1,000 mg, Oral, Q supper, Consuella Lose,  MD, 1,000 mg at 04/10/14 1752; metFORMIN (GLUCOPHAGE-XR) 24 hr tablet 500 mg, 500 mg, Oral, BID WC, Consuella Lose, MD, 500 mg at 04/11/14 0951; morphine 2 MG/ML injection 1-2 mg, 1-2 mg, Intravenous, Q2H PRN, Consuella Lose, MD; multivitamin with minerals tablet 1 tablet, 1 tablet, Oral, Daily, Consuella Lose, MD, 1 tablet at 04/11/14 0950 ondansetron (ZOFRAN) tablet 4 mg, 4 mg, Oral, Q4H PRN **OR** ondansetron (ZOFRAN) injection 4 mg, 4 mg, Intravenous, Q4H PRN, Consuella Lose, MD; pantoprazole (PROTONIX) EC tablet 40 mg, 40 mg, Oral, QHS, Consuella Lose, MD, 40 mg at 04/10/14 2159; pravastatin (PRAVACHOL) tablet 40 mg, 40 mg, Oral, q1800, Consuella Lose, MD, 40 mg at 04/10/14 1752 promethazine (PHENERGAN) tablet 12.5-25 mg, 12.5-25 mg, Oral, Q4H PRN, Consuella Lose, MD; senna (SENOKOT) tablet 8.6 mg, 1 tablet, Oral, BID, Consuella Lose, MD, 8.6 mg at 04/11/14 0950  Patients Current Diet: Diet Carb Modified  Precautions / Restrictions Precautions Precautions: Fall Restrictions Weight Bearing Restrictions: No   Prior Activity Level Community (5-7x/wk): Went out daily   Development worker, international aid / [place] Devices/Equipment: None Home Equipment: None  Prior Functional Level Prior Function Level of Independence: Independent 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  Current Functional Level Cognition  Overall Cognitive Status: No family/caregiver present to determine baseline cognitive functioning Orientation Level: Oriented X4   Extremity Assessment (includes Sensation/Coordination)  Upper Extremity Assessment: Defer to OT evaluation;Overall WFL for tasks assessed Lower Extremity Assessment: LLE deficits/detail LLE Deficits /  Details: hip flexion 2+, knee extension 1+, dorsiflexion 1+; pt reports episodes of extensor tone/spasms Cervical / Trunk Assessment: Kyphotic   ADLs  Overall ADL's : Needs assistance/impaired Eating/Feeding: Independent, Sitting Grooming: Wash/dry hands, Minimal assistance, Standing Upper Body Bathing: Sitting, Moderate assistance Lower Body Bathing: Maximal assistance, Sit to/from stand Upper Body Dressing : Sitting, Moderate assistance Lower Body Dressing: Supervision/safety, Sitting/lateral leans Lower Body Dressing Details (indicate cue type and reason): donned socks in sitting, used hands to place L foot atop R knee General ADL Comments: No LOB with wide range reach and LB dressing in sitting. Pt stood at sink x 2 with min assist to maintain balance while reaching for various items and performed hand washing. Verbal cues for posture.    Mobility  Overal bed mobility: Needs Assistance Bed Mobility: Supine to Sit Supine to sit: Mod assist General bed mobility comments: HOB 0 no rail; pt allowed to problem-solve his way to get to EOB; he used his RUE to assist his LLE over EOB, multiple losses of balance posteriorly and to the right (especially if he lifted Rt hand off the mattress) required assist to prevent fall    Transfers  Overall transfer level: Needs assistance Equipment used: Rolling walker (2 wheeled) Transfers: Sit to/from Stand Sit to Stand: Mod assist, Min assist Squat pivot transfers: Mod assist General transfer comment: stood with R hand on sink and L on the arm of the chair.    Ambulation / Gait / Stairs / Wheelchair Mobility  Ambulation/Gait Ambulation/Gait assistance: Mod assist Ambulation Distance (Feet): 4 Feet Assistive device: Rolling walker (2 wheeled) Gait Pattern/deviations: Step-to pattern, Decreased step length - right, Decreased step length - left, Decreased stance time - left, Decreased dorsiflexion - left, Decreased weight shift to  left Gait velocity interpretation: <1.8 ft/sec, indicative of risk for recurrent falls General Gait Details: 50% of steps required assist to advance LLE; pt using combination of hip hike and ?hip flexion to advance LLE; ankle tending to supinate and required incr time to achieve  foot flat prior to advancing RLE; as pt fatigued, developed flexor tone in LLE with inability to maintain Lt foot on floor and RLE weak with difficulty maintaining extension; BSC pulled behind pt and assisted to sit to avoid fall    Posture / Balance Overall balance assessment: Needs assistance Sitting-balance support: Bilateral upper extremity supported;Feet supported Sitting balance-Leahy Scale: Poor Postural control: Posterior lean;Right lateral lean Standing balance support: Bilateral upper extremity supported Standing balance-Leahy Scale: Poor    Special needs/care consideration BiPAP/CPAP No CPM No Continuous Drip IV 0.9% NS 100 ml/hr Dialysis No  Life Vest No Oxygen No Special Bed No Trach Size No Wound Vac (area) No  Skin No  Bowel mgmt: Last BM 04/10/14 Bladder mgmt: Condom catheter Diabetic mgmt Yes, on oral medication at home    Previous Home Environment Living Arrangements: Spouse/significant other Available Help at Discharge: Family, Friend(s), Available 24 hours/day Type of Home: House Home Layout: One level Home Access: Stairs to enter Entrance Stairs-Rails: None Technical brewer of Steps: 3 Bathroom Toilet: Standard Home Care Services: No  Discharge Living Setting Plans for Discharge Living Setting: Patient's home, House, Lives with (comment) (Lives with wife.) Type of Home at Discharge: House Discharge Home Layout: One level Discharge Home Access: Stairs to enter Entrance Stairs-Number of Steps: 3 Does the patient have any problems obtaining your medications?: No  Social/Family/Support Systems Patient Roles: Spouse,  Parent Contact Information: Klay Sobotka - wife Anticipated Caregiver: wife Anticipated Caregiver's Contact Information: Curt Bears - wife [phone] (c) [phone] Ability/Limitations of Caregiver: Wife works PT as a Oceanographer but plans to not work while patient is recovering. Caregiver Availability: 24/7 Discharge Plan Discussed with Primary Caregiver: Yes Is Caregiver In Agreement with Plan?: Yes Does Caregiver/Family have Issues with Lodging/Transportation while Pt is in Rehab?: No  Goals/Additional Needs Patient/Family Goal for Rehab: PT/OT mod I goals Expected length of stay: 10-14 days Cultural Considerations: [employer] Dietary Needs: Carb mod med cal thin liquids Equipment Needs: TBD Pt/Family Agrees to Admission and willing to participate: Yes Program Orientation Provided & Reviewed with Pt/Caregiver Including Roles & Responsibilities: Yes  Decrease burden of Care through IP rehab admission: N/A  Possible need for SNF placement upon discharge: Not anticipated  Patient Condition: This patient's medical and functional status has changed since the consult dated: 04/09/14 in which the Rehabilitation Physician determined and documented that the patient's condition is appropriate for intensive rehabilitative care in an inpatient rehabilitation facility. See "History of Present Illness" (above) for medical update. Functional changes are: Currently requiring mod/max assist for transfers and mod assist to ambulate 4 ft RW. Patient's medical and functional status update has been discussed with the Rehabilitation physician and patient remains appropriate for inpatient rehabilitation. Will admit to inpatient rehab today.  Preadmission Screen Completed By: Retta Diones, 04/11/2014 11:35 AM ______________________________________________________________________  Discussed status with Dr. Naaman Plummer on 04/11/14 at 1135 and received telephone approval for admission today.  Admission  Coordinator: Retta Diones, time1135/Date11/04/15          Cosigned by: Meredith Staggers, MD at 04/11/2014 12:00 PM

## 2014-04-11 NOTE — Progress Notes (Signed)
Physical Therapy Treatment Patient Details Name: Ivan Stark MRN: [id number] DOB: 28-Jan-1946 Today's Date: 04/11/2014    History of Present Illness Adm 04/06/14 for bilateral frontal-parietal craniotomy. PMHx- HTN, DM    PT Comments    Patient highly motivated and is making great progress today with ambulation. Patient able to increase ambulation with +2 assist and chair to follow. Required seated rest break. Encouraged LLE "foot sliding" while sitting up in recliner. Continue to recommend comprehensive inpatient rehab (CIR) for post-acute therapy needs.   Follow Up Recommendations  CIR     Equipment Recommendations  Rolling walker with 5" wheels    Recommendations for Other Services       Precautions / Restrictions Precautions Precautions: Fall Restrictions Weight Bearing Restrictions: No    Mobility  Bed Mobility               General bed mobility comments: Patient in recliner before and after session  Transfers Overall transfer level: Needs assistance Equipment used: Rolling walker (2 wheeled)   Sit to Stand: Mod assist         General transfer comment: A to power up into standing and to ensure balance and patient tends to have posterior lean initially  Ambulation/Gait Ambulation/Gait assistance: +2 physical assistance;Min assist;Mod assist Ambulation Distance (Feet): 60 Feet Assistive device: Rolling walker (2 wheeled) Gait Pattern/deviations: Step-to pattern;Decreased weight shift to right;Decreased step length - right;Decreased step length - left;Decreased dorsiflexion - left   Gait velocity interpretation: <1.8 ft/sec, indicative of risk for recurrent falls General Gait Details: A to advance LLE and shift weight to right side. Cues throughout for positoining and sequency of RW. Patient very heavy UE assist. Better foot placement without supination this session. +1 to assist with weight shifting and chair follow   Stairs             Wheelchair Mobility    Modified Rankin (Stroke Patients Only)       Balance     Sitting balance-Leahy Scale: Fair       Standing balance-Leahy Scale: Poor                      Cognition Arousal/Alertness: Awake/alert Behavior During Therapy: Impulsive Overall Cognitive Status: No family/caregiver present to determine baseline cognitive functioning                      Exercises      General Comments        Pertinent Vitals/Pain Pain Assessment: No/denies pain    Home Living                      Prior Function            PT Goals (current goals can now be found in the care plan section) Progress towards PT goals: Progressing toward goals    Frequency  Min 4X/week    PT Plan Current plan remains appropriate    Co-evaluation             End of Session Equipment Utilized During Treatment: Gait belt Activity Tolerance: Patient tolerated treatment well Patient left: in chair;with call bell/phone within reach;with chair alarm set     Time: 7001-7494 PT Time Calculation (min): 39 min  Charges:  $Gait Training: 23-37 mins $Therapeutic Activity: 8-22 mins                    G Codes:  Jacqualyn Posey 04/11/2014, 1:51 PM 04/11/2014 Jacqualyn Posey PTA [phone] pager [phone] office

## 2014-04-11 NOTE — Progress Notes (Signed)
Received pt. As a transfer from 4 north,pt. Alert and oriented to unit and routine.Safety plan was explained to pt. And his wife,fall prevention paper was explained and sign by the pt. And the RN.Pt. Has a surgical incision OTA on his scalp.Otherwise skin is intact.No equipment from home is present at the moment from home.Safety video was unavailable at the moment.Keep monitoring pt. Closely and assessing his needs.

## 2014-04-11 NOTE — Interval H&P Note (Signed)
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  The patient's history has been reviewed, patient examined, and there is no change in status.  Patient continues to be appropriate for intensive inpatient rehabilitation.  I have reviewed the patient's chart and labs.  Questions were answered to the patient's satisfaction.  SWARTZ,ZACHARY T 04/11/2014, 5:04 PM

## 2014-04-11 NOTE — Progress Notes (Signed)
Rehab admissions - I have approval from insurance case manager for acute inpatient rehab admission for today.  I have called MD office to request discharge order.  If MD approves, we will admit to inpatient rehab today.  Call me for questions.  #[phone]

## 2014-04-11 NOTE — Progress Notes (Signed)
Discharge orders received. Pt for discharge to inpatient rehab today. No IV present. Family in room at time of discharge/transfer to CIR. Pt taken to 4M07 via wheelchair; no acute distress noted.

## 2014-04-11 NOTE — H&P (View-Only) (Signed)
Physical Medicine and Rehabilitation Admission H&P    CC: Left leg weakness and difficulty walking.   HPI:   Ivan Stark is a 68 y.o. H/o DM type 2, HTN, gait instability with LLE weakness  since July 2015 and MRI of brain revealed three intracranial tumors consistent with meningioma and right sided lesion with edema likely responsible for symptoms. He was evaluated by Dr. Kathyrn Sheriff and admitted on 04/06/14 for stereotactic bilateral frontoparietal craniotomy with resection of left and right frontal tumor. Post op leg weakness treated with decadron. 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 He has had elevated BS due to steroids and was started on levemir as well as decadron taper. He has had some improvement in LE strength and therapy ongoing. 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  Patient with flexor tone LLE as well as poor safety and impulsivity. CIR recommended by Rehab team and MD and patient ready for admission today.   Review of Systems  HENT: Negative for hearing loss.   Eyes: Negative for blurred vision and double vision.  Respiratory: Negative for cough and shortness of breath.   Cardiovascular: Negative for chest pain and palpitations.  Gastrointestinal: Positive for constipation. Negative for heartburn and nausea.  Genitourinary: Negative for dysuria and urgency.  Musculoskeletal: Negative for myalgias, back pain and joint pain.  Neurological: Positive for tingling (bilateral hands) and focal weakness (LLE). Negative for dizziness and headaches.  Psychiatric/Behavioral: The patient is nervous/anxious (due to steriods) and has insomnia.       Past Medical History  Diagnosis Date  . Diabetes mellitus without complication   . Hypertension   . Hyperlipidemia   . GERD (gastroesophageal reflux disease)   .  Vitamin D deficiency   . History of colon polyps   . Complication of anesthesia     Difficult to wake up; and severe nausea and vomiting  . PONV (postoperative nausea and vomiting)     Past Surgical History  Procedure Laterality Date  . Tonsillectomy    . Colonoscopy w/ polypectomy    . Esophagus surgery      Had esophagus stretched due to food getting trapped in his throat  . Craniotomy Bilateral 04/06/2014    Procedure: Bilateral Fronto-parietal craniotomy for resection of tumors with Curve;  Surgeon: Consuella Lose, MD;  Location: [facility];  Service: Neurosurgery;  Laterality: Bilateral;  Bilateral Fronto-parietal craniotomy for resection of tumors with Curve    Family History  Problem Relation Age of Onset  . Goiter Mother   . Hypertension Father   . Cancer Father     colon    Social History:  Married. Still works --Artist.  He reports that he has never smoked. He does not have any smokeless tobacco history on file. He reports that he does not drink alcohol or use illicit drugs.   Allergies  Allergen Reactions  . Atenolol Shortness Of Breath  . Lipitor [Atorvastatin]     Itch   Medications Prior to Admission  Medication Sig Dispense Refill  . Ascorbic Acid (VITAMIN C) 1000 MG tablet Take 1,000 mg by mouth daily.    Marland Kitchen aspirin EC 81 MG tablet Take 81 mg by mouth daily.    . Cholecalciferol (VITAMIN D) 2000 UNITS CAPS Take 4,000 Units by mouth daily.     Marland Kitchen dexamethasone (DECADRON) 4 MG tablet Take  4 mg by mouth 2 (two) times daily.    . enalapril (VASOTEC) 20 MG tablet Take 20 mg by mouth daily.    Marland Kitchen glimepiride (AMARYL) 4 MG tablet Take 2 mg by mouth 2 (two) times daily.    . metFORMIN (GLUCOPHAGE-XR) 500 MG 24 hr tablet Take 500 mg by mouth 3 (three) times daily. 500mg  in a.m., 500mg . At lunch, 1000mg . In the evening    . Multiple Vitamin (MULTIVITAMIN WITH MINERALS) TABS Take 1 tablet by mouth daily.    . pravastatin (PRAVACHOL) 40 MG tablet Take 1  tablet (40 mg total) by mouth at bedtime. 30 tablet 1  . 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 Status:  Mobility: Bed Mobility Overal bed mobility: Needs Assistance Bed Mobility: Supine to Sit Supine to sit: Mod assist General bed mobility comments: pt up in chair Transfers Overall transfer level: Needs assistance Equipment used: Rolling walker (2 wheeled) Transfers: Sit to/from Stand Sit to Stand: Min assist General transfer comment: verbal cues for hand placement and technique, leaning to R initially, heavy reliance on UEs Ambulation/Gait Ambulation/Gait assistance: Mod assist Ambulation Distance (Feet): 3 Feet Assistive device: Rolling walker (2 wheeled) Gait Pattern/deviations: Step-to pattern, Decreased step length - left, Decreased dorsiflexion - left, Decreased weight shift to left (Lt hip hike) Gait velocity interpretation: <1.8 ft/sec, indicative of risk for recurrent falls General Gait Details: pt able to advance LLE forward using hip hike; required assist to step backwards with LLE; incr bil UE support with no Lt knee buckling, however do not feel pt was at any time placing full weight on the LLE    ADL: ADL Overall ADL's : Needs assistance/impaired Eating/Feeding: Independent, Sitting Grooming: Wash/dry hands, Wash/dry face, Oral care, Sitting Upper Body Bathing: Sitting, Moderate assistance Lower Body Bathing: Maximal  assistance, Sit to/from stand Upper Body Dressing : Sitting, Moderate assistance Lower Body Dressing: Maximal assistance, Sit to/from stand General ADL Comments: Pt with decreased sitting balance interfering with bathing and dressing.  Posterior and R lean.  Effective use of hands requires supported sitting in recliner.  Cognition: Cognition Overall Cognitive Status: Within Functional Limits for tasks assessed Orientation Level: Oriented X4 Cognition Arousal/Alertness: Awake/alert Behavior During Therapy: WFL for tasks assessed/performed Overall Cognitive Status: Within Functional Limits for tasks assessed  Physical Exam: Blood pressure 143/81, pulse 73, temperature 98.1 F (36.7 C), temperature source Oral, resp. rate 20, height 6\' 4"  (1.93 m), weight 94.8 kg (208 lb 15.9 oz), SpO2 95 %. Physical Exam Vitals reviewed. Constitutional: He is oriented to person, place, and time. He appears well-developed.  HENT: oral mucosa pink and moist Craniotomy site with staples intact.  Eyes: EOM are normal.  Neck: Normal range of motion. Neck supple. No thyromegaly present.  Cardiovascular: Normal rate and regular rhythm.  Respiratory: Effort normal and breath sounds normal. No respiratory distress.  GI: Soft. Bowel sounds are normal. He exhibits no distension.  Neurological: He is alert and oriented to person, place, and time.  Follows commands. Good insight and awareness. Memory  intact. Language normal.no CN deficits.  Right upper extremity 5/5 deltoid, biceps, triceps, grip, left upper extremity 4+/5 in the deltoid, bicep, tricep, grip. 4/5 right hip flexor knee extensor and ankle dorsiflexor plantar flexor. Left lower extremity  Trace to 1/5 hip flexor knee extensor, 0/5 ankle dorsiflexor plantar flexor Sensation sl diminished to LT left arm and leg. Mood and affect are appropriate No   left neglect. No abnormal tone.  Skin: Skin is warm and dry.  Psych: mood pleasant and up  beat   Results for orders placed or performed during the hospital encounter of 04/06/14 (from the past 48 hour(s))  Glucose, capillary     Status: Abnormal   Collection Time: 04/08/14 11:29 AM  Result Value Ref Range   Glucose-Capillary 288 (H) 70 - 99 mg/dL  Glucose, capillary     Status: Abnormal   Collection Time: 04/08/14  4:46 PM  Result Value Ref Range   Glucose-Capillary 296 (H) 70 - 99 mg/dL  Glucose, capillary     Status: Abnormal   Collection Time: 04/08/14 10:36 PM  Result Value Ref Range   Glucose-Capillary 122 (H) 70 - 99 mg/dL   Comment 1 Notify RN    Comment 2 Documented in Chart   Glucose, capillary     Status: Abnormal   Collection Time: 04/09/14  7:26 AM  Result Value Ref Range   Glucose-Capillary 238 (H) 70 - 99 mg/dL   Comment 1 Documented in Chart    Comment 2 Notify RN   Glucose, capillary     Status: Abnormal   Collection Time: 04/09/14 11:36 AM  Result Value Ref Range   Glucose-Capillary 227 (H) 70 - 99 mg/dL   Comment 1 Documented in Chart   Glucose, capillary     Status: Abnormal   Collection Time: 04/09/14  4:15 PM  Result Value Ref Range   Glucose-Capillary 188 (H) 70 - 99 mg/dL  Glucose, capillary     Status: Abnormal   Collection Time: 04/09/14 10:01 PM  Result Value Ref Range   Glucose-Capillary 189 (H) 70 - 99 mg/dL   Comment 1 Documented in Chart    Comment 2 Notify RN   Glucose, capillary     Status: Abnormal   Collection Time: 04/10/14  6:50 AM  Result Value Ref Range   Glucose-Capillary 209 (H) 70 - 99 mg/dL   Comment 1 Documented in Chart    Comment 2 Notify RN    No results found.     Medical Problem List and Plan: 1. Functional deficits secondary to Right frontal meningioma status post resection with left hemiparesis 2.  DVT Prophylaxis/Anticoagulation: Pharmaceutical: Lovenox--cleared to initiate per Dr. Kathyrn Sheriff 3. Pain Management: Will continue hydrocodone prn.  4. Mood: Reports anxiety/restlessness due to steroids.  Will add low dose xanax prn. LCSW to follow for evaluation and support.  5. Neuropsych: This patient is capable of making decisions on *his own behalf. 6. Skin/Wound Care: Routine pressure relief measures.  7. Fluids/Electrolytes/Nutrition: Monitor intake. Offer supplements as needed to maintain adequate intake.  8. DM type 2:  Monitor BS ac/hs and continue Levemir for now. ON amaryl and metformin bid additionally.  9. Seizure prophylaxis: Continue Keppra bid.  10. HTN: Continue vasotec daily. Monitor BP every 8 hours and adjust as needed.  11. Metastatic Lung cancer: Patient aware and Hem/onc to be consulted for work up and input.   Post Admission Physician Evaluation: 1. Functional deficits secondary  to right frontal meningioma, metastatic lung cancer with left  hemiparesis 2. Patient is admitted to receive collaborative, interdisciplinary care between the physiatrist, rehab nursing staff, and therapy team. 3. Patient's level of medical complexity and substantial therapy needs in context of that medical necessity cannot be provided at a lesser intensity of care such as a SNF. 4. Patient has experienced substantial functional loss from his/her baseline which was documented above under the "Functional History" and "Functional Status" headings.  Judging by the patient's diagnosis, physical exam, and functional history, the patient has potential for functional progress which will result in measurable gains while on inpatient rehab.  These gains will be of substantial and practical use upon discharge  in facilitating mobility and self-care at the household level. 5. Physiatrist will provide 24 hour management of medical needs as well as oversight of the therapy plan/treatment and provide guidance as appropriate regarding the interaction of the two. 6. 24 hour rehab nursing will assist with bladder management, bowel management, safety, skin/wound care, disease management, medication administration, pain  management and patient education  and help integrate therapy concepts, techniques,education, etc. 7. PT will assess and treat for/with: Lower extremity strength, range of motion, stamina, balance, functional mobility, safety, adaptive techniques and equipment, NMR, ego support, orthotic fitting, pain control, family education.   Goals are: mod I to supervision. 8. OT will assess and treat for/with: ADL's, functional mobility, safety, upper extremity strength, adaptive techniques and equipment, NMR, ego support, community reintegration.   Goals are: mod I. Therapy may not yet proceed with showering this patient. 9. SLP will assess and treat for/with: n/a.  Goals are: n/a. 10. Case Management and Social Worker will assess and treat for psychological issues and discharge planning. 11. Team conference will be held weekly to assess progress toward goals and to determine barriers to discharge. 12. Patient will receive at least 3 hours of therapy per day at least 5 days per week. 13. ELOS: 16-20 days   14. Prognosis:  good     Meredith Staggers, MD, [facility] 04/11/2014

## 2014-04-12 ENCOUNTER — Inpatient Hospital Stay (HOSPITAL_COMMUNITY): Payer: Medicare Other

## 2014-04-12 ENCOUNTER — Encounter (HOSPITAL_COMMUNITY): Payer: Self-pay | Admitting: Radiology

## 2014-04-12 ENCOUNTER — Inpatient Hospital Stay (HOSPITAL_COMMUNITY): Payer: Medicare Other | Admitting: Occupational Therapy

## 2014-04-12 ENCOUNTER — Inpatient Hospital Stay (HOSPITAL_COMMUNITY): Payer: Medicare Other | Admitting: Physical Therapy

## 2014-04-12 DIAGNOSIS — M79609 Pain in unspecified limb: Secondary | ICD-10-CM

## 2014-04-12 DIAGNOSIS — R531 Weakness: Secondary | ICD-10-CM

## 2014-04-12 DIAGNOSIS — C801 Malignant (primary) neoplasm, unspecified: Secondary | ICD-10-CM

## 2014-04-12 DIAGNOSIS — D62 Acute posthemorrhagic anemia: Secondary | ICD-10-CM

## 2014-04-12 LAB — COMPREHENSIVE METABOLIC PANEL
ALK PHOS: 55 U/L (ref 39–117)
ALT: 12 U/L (ref 0–53)
ANION GAP: 14 (ref 5–15)
AST: 13 U/L (ref 0–37)
Albumin: 2.4 g/dL — ABNORMAL LOW (ref 3.5–5.2)
BUN: 20 mg/dL (ref 6–23)
CO2: 24 mEq/L (ref 19–32)
Calcium: 8.9 mg/dL (ref 8.4–10.5)
Chloride: 102 mEq/L (ref 96–112)
Creatinine, Ser: 0.82 mg/dL (ref 0.50–1.35)
GFR calc Af Amer: >90 mL/min (ref >90)
GFR calc non Af Amer: >90 mL/min (ref >90)
GLUCOSE: 136 mg/dL — AB (ref 70–99)
POTASSIUM: 4.5 meq/L (ref 3.7–5.3)
Sodium: 140 mEq/L (ref 137–147)
Total Bilirubin: 0.4 mg/dL (ref 0.3–1.2)
Total Protein: 5.4 g/dL — ABNORMAL LOW (ref 6.0–8.3)

## 2014-04-12 LAB — CBC WITH DIFFERENTIAL/PLATELET
Basophils Absolute: 0 10*3/uL (ref 0.0–0.1)
Basophils Relative: 0 % (ref 0–1)
Eosinophils Absolute: 0 10*3/uL (ref 0.0–0.7)
Eosinophils Relative: 0 % (ref 0–5)
HCT: 34.3 % — ABNORMAL LOW (ref 39.0–52.0)
HEMOGLOBIN: 11.4 g/dL — AB (ref 13.0–17.0)
LYMPHS ABS: 1.5 10*3/uL (ref 0.7–4.0)
Lymphocytes Relative: 15 % (ref 12–46)
MCH: 29.7 pg (ref 26.0–34.0)
MCHC: 33.2 g/dL (ref 30.0–36.0)
MCV: 89.3 fL (ref 78.0–100.0)
MONOS PCT: 9 % (ref 3–12)
Monocytes Absolute: 0.9 10*3/uL (ref 0.1–1.0)
NEUTROS ABS: 7.8 10*3/uL — AB (ref 1.7–7.7)
NEUTROS PCT: 76 % (ref 43–77)
Platelets: 306 10*3/uL (ref 150–400)
RBC: 3.84 MIL/uL — AB (ref 4.22–5.81)
RDW: 13.3 % (ref 11.5–15.5)
WBC: 10.3 10*3/uL (ref 4.0–10.5)

## 2014-04-12 LAB — GLUCOSE, CAPILLARY
GLUCOSE-CAPILLARY: 91 mg/dL (ref 70–99)
GLUCOSE-CAPILLARY: 130 mg/dL — AB (ref 70–99)
Glucose-Capillary: 141 mg/dL — ABNORMAL HIGH (ref 70–99)
Glucose-Capillary: 152 mg/dL — ABNORMAL HIGH (ref 70–99)

## 2014-04-12 MED ORDER — ALPRAZOLAM 0.25 MG PO TABS
0.5000 mg | ORAL_TABLET | Freq: Two times a day (BID) | ORAL | Status: DC | PRN
  Administered 2014-04-22 – 2014-04-23 (×2): 0.5 mg via ORAL
  Filled 2014-04-12 (×3): qty 2

## 2014-04-12 MED ORDER — SORBITOL 70 % SOLN
30.0000 mL | Freq: Every day | Status: DC | PRN
  Administered 2014-04-22: 30 mL via ORAL
  Filled 2014-04-12: qty 30

## 2014-04-12 MED ORDER — IOHEXOL 300 MG/ML  SOLN
100.0000 mL | Freq: Once | INTRAMUSCULAR | Status: AC | PRN
  Administered 2014-04-12: 100 mL via INTRAVENOUS

## 2014-04-12 MED ORDER — DEXAMETHASONE 4 MG PO TABS
4.0000 mg | ORAL_TABLET | Freq: Two times a day (BID) | ORAL | Status: DC
Start: 2014-04-12 — End: 2014-04-28
  Administered 2014-04-12 – 2014-04-28 (×31): 4 mg via ORAL
  Filled 2014-04-12 (×35): qty 1

## 2014-04-12 NOTE — Progress Notes (Signed)
Occupational Therapy Note  Patient Details  Name: Ivan Stark MRN: [id number] Date of Birth: November 18, 1945  Today's Date: 04/12/2014 OT Individual Time: 1400-1500 OT Individual Time Calculation (min): 60 min   Pt denied pain Individual Therapy  Pt resting in w/c with wife present and agreeable to participating in therapy.  Pt initially engaged in dynamic standing activity at hi-lo table.  Pt required unilateral UE support while palying game of checkers.  Pt hyperextended RLE and required support on LLE during standing tasks.  Pt transitioned to practicing squat pivot transfers to hi-lo mat.  Pt required mod A for transfers.  Pt engaged in squats from elevated surface with and without use of BUE.  Pt practiced squat pivot transfers to elevated surface simulating height of bed at home.  Pt required mod A for w/c->mat and min A for mat->w/c.  Pt propelled back to room and remained in room with wife present.   Leotis Shames [facility] 04/12/2014, 4:12 PM

## 2014-04-12 NOTE — Care Management Note (Signed)
[facility] Individual Statement of Services  Patient Name:  Ivan Stark  Date:  04/12/2014  Welcome to the [facility].  Our goal is to provide you with an individualized program based on your diagnosis and situation, designed to meet your specific needs.  With this comprehensive rehabilitation program, you will be expected to participate in at least 3 hours of rehabilitation therapies Monday-Friday, with modified therapy programming on the weekends.  Your rehabilitation program will include the following services:  Physical Therapy (PT), Occupational Therapy (OT), 24 hour per day rehabilitation nursing, Therapeutic Recreaction (TR), Neuropsychology, Case Management (Social Worker), Rehabilitation Medicine, Nutrition Services and Pharmacy Services  Weekly team conferences will be held on Tuesdays to discuss your progress.  Your Social Worker will talk with you frequently to get your input and to update you on team discussions.  Team conferences with you and your family in attendance may also be held.  Expected length of stay: 14 -18 days  Overall anticipated outcome: modified independent  Depending on your progress and recovery, your program may change. Your Social Worker will coordinate services and will keep you informed of any changes. Your Social Worker's name and contact numbers are listed  below.  The following services may also be recommended but are not provided by the [facility] will be made to provide these services after discharge if needed.  Arrangements include referral to agencies that provide these services.  Your insurance has been verified to be:  [employer] Your primary doctor is:  Dr. Unk Pinto  Pertinent information will be shared with your doctor and your  insurance company.  Social Worker:  [place], [place] or ([phone]   Information discussed with and copy given to patient by: Lennart Pall, 04/12/2014, 2:44 PM

## 2014-04-12 NOTE — Progress Notes (Signed)
Social Work  Social Work Assessment and Plan  Patient Details  Name: Ivan Stark MRN: [id number] Date of Birth: [birth year]  Today's Date: 04/12/2014  Problem List:  Patient Active Problem List   Diagnosis Date Noted  . Metastatic cancer to brain 04/11/2014  . Cerebral meningioma   . Left flaccid hemiparesis   . Left hemiparesis 04/09/2014  . 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 Poor compliance with F/U 02/04/2014  . 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 Encounter for long-term (current) use of other medications 08/20/2013  . Hypertension   . Hyperlipidemia   . GERD (gastroesophageal reflux disease)   . Vitamin D deficiency   . History of colon polyps    Past Medical History:  Past Medical History  Diagnosis Date  . Diabetes mellitus without complication   . Hypertension   . Hyperlipidemia   . GERD (gastroesophageal reflux disease)   . Vitamin D deficiency   . History of colon polyps   . Complication of anesthesia     Difficult to wake up; and severe nausea and vomiting  . PONV (postoperative nausea and vomiting)    Past Surgical History:  Past Surgical History  Procedure Laterality Date  . Tonsillectomy    . Colonoscopy w/ polypectomy    . Esophagus surgery      Had esophagus stretched due to food getting trapped in his throat  . Craniotomy Bilateral 04/06/2014    Procedure: Bilateral Fronto-parietal craniotomy for resection of tumors with Curve;  Surgeon: Consuella Lose, MD;  Location: [facility];  Service: Neurosurgery;  Laterality: Bilateral;  Bilateral Fronto-parietal craniotomy for resection of tumors with Curve   Social History:  reports that he has never smoked. He does not have any smokeless tobacco history on file. He reports that he does not drink alcohol or use illicit drugs.  Family / Support Systems Marital Status: Married How Long?: 40 yrs Patient Roles: Spouse, Parent Spouse/Significant Other: wife, Jonthan Leite @ [phone] or [phone] Children: three adult children:  Klyde Banka [facility] Autumn [facility] - very close to pt) and Marland Kitchen Jule Ser) Other Supports: church members and friends Anticipated Caregiver: wife Ability/Limitations of Caregiver: Wife works PT as a Oceanographer but plans to not work while patient is recovering. Caregiver Availability: 24/7 Family Dynamics: Pt and wife report very close relationship with all family members and patient.  Daughter "especially close" to pt.    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  Both tearful at times during the interview but explain "these aren't tears of saddness but of joy over all the support we have." (patient.)  Pt denies any s/s of depression.  Describes having difficulty sleeping, however, feels this is more related to steroids.  Wife does admit that she is having trouble with  sleep and does feel "panicky" at times - she is to discuss with her MD.  Discussed psychology supports we have available to him as he needs.   Recent Psychosocial Issues: None Pyschiatric History: None Substance Abuse History: None  Patient / Family Perceptions, Expectations &  Goals Pt/Family understanding of illness & functional limitations: Pt and wife with very good understanding of his tumors, surgery and malignant diagnosis.  They are "waiting to see" what the future treatment course will be. Premorbid pt/family roles/activities: Pt completely independent and active with his large, full-time need church. Anticipated changes in roles/activities/participation: Uncertain how long before pt will be able to return to work.  Wife prepared to assume caregiver role. Pt/family expectations/goals: Pt and wife hopeful pt will reach an independent level of fxn by d/c.  Community Resources Express Scripts: None Premorbid Home Care/DME Agencies: None Transportation available at discharge: yes Resource referrals recommended: Neuropsychology  Discharge Planning Living Arrangements: Spouse/significant other Support Systems: Spouse/significant other, Children, Other relatives, Water engineer, Social worker community Type of Residence: Private residence Insurance Resources: Chartered certified accountant Resources: Employment, Radio broadcast assistant Screen Referred: No Money Management: Patient Does the patient have any problems obtaining your medications?: No Home Management: pt and wife Patient/Family Preliminary Plans: Pt fully intends to return home with his wife as Licensed conveyancer with intermittent support of family. Social Work Anticipated Follow Up Needs: HH/OP Expected length of stay: 10-14 days  Clinical Impression Very pleasant gentleman here following surgery for removal of malignant brain tumor.  A full-time Soil scientist church and with large amount of support from family and church community.  Adjustment reactions seem appropriate to situation but will monitor and refer to neuropsych as needed.  Wife fully prepared to provide any assistance needed.  Will follow for support and d/c planning needs.  Aziza Stuckert 04/12/2014, 2:58 PM

## 2014-04-12 NOTE — Progress Notes (Signed)
  Radiation Oncology         [phone] ________________________________  Name: Ivan Stark MRN: [id number]  Date: 04/11/2014  DOB: Sep 19, 1945  Chart Note:  I spoke to Dr. Benay Spice about this patient.  He is status post resection of two brain tumors:    He is recovering with some left hemiparesis in rehab.  He has a smaller 9.9 mm right frontal brain tumor that was not resected:    Impression:  In light of this information, he will likely need whole brain radiation, 14 or more days post craniotomy.  Plan:  At this point, the patient will be set up for consultation.  Additional staging work-up is underway.  ________________________________  Sheral Apley. Tammi Klippel, M.D.

## 2014-04-12 NOTE — Plan of Care (Signed)
Problem: RH SKIN INTEGRITY Goal: RH STG SKIN FREE OF INFECTION/BREAKDOWN With min. assisst  Outcome: Progressing Goal: RH STG MAINTAIN SKIN INTEGRITY WITH ASSISTANCE STG Maintain Skin Integrity With min.Assistance.  Outcome: Progressing Goal: RH OTHER STG SKIN INTEGRITY GOALS W/ASSIST Other STG Skin Integrity Goals With Assistance.  Outcome: Progressing  Problem: RH SAFETY Goal: RH STG ADHERE TO SAFETY PRECAUTIONS W/ASSISTANCE/DEVICE STG Adhere to Safety Precautions With mod. Assistance/Device.  Outcome: Progressing Goal: RH STG DECREASED RISK OF FALL WITH ASSISTANCE STG Decreased Risk of Fall With Assistance.  Outcome: Progressing

## 2014-04-12 NOTE — Progress Notes (Signed)
*  PRELIMINARY RESULTS* Vascular Ultrasound Lower extremity venous duplex has been completed.  Preliminary findings: No evidence of DVT.  Landry Mellow, RDMS, RVT  04/12/2014, 3:54 PM

## 2014-04-12 NOTE — Plan of Care (Signed)
Problem: RH BOWEL ELIMINATION Goal: RH STG MANAGE BOWEL WITH ASSISTANCE STG Manage Bowel with min Assistance.  Outcome: Progressing Goal: RH STG MANAGE BOWEL W/MEDICATION W/ASSISTANCE STG Manage Bowel with Medication with min. Assistance.  Outcome: Progressing  Problem: RH SKIN INTEGRITY Goal: RH STG SKIN FREE OF INFECTION/BREAKDOWN With min. assisst  Outcome: Progressing

## 2014-04-12 NOTE — Evaluation (Signed)
Occupational Therapy Assessment and Plan  Patient Details  Name: Ivan Stark MRN: [id number] Date of Birth: 1946-03-18  OT Diagnosis: abnormal posture, hemiplegia, muscle weakness (generalized) and decreased balance Rehab Potential: Good ELOS: 2 weeks  Today's Date: 04/12/2014 OT Individual Time: 0800-0900 OT Individual Time Calculation (min): 60 min     Problem List:  Patient Active Problem List   Diagnosis Date Noted  . Metastatic cancer to brain 04/11/2014  . Cerebral meningioma   . Left flaccid hemiparesis   . Left hemiparesis 04/09/2014  . 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 Poor compliance with F/U 02/04/2014  . 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 Encounter for long-term (current) use of other medications 08/20/2013  . Hypertension   . Hyperlipidemia   . GERD (gastroesophageal reflux disease)   . Vitamin D deficiency   . History of colon polyps     Past Medical History:  Past Medical History  Diagnosis Date  . Diabetes mellitus without complication   . Hypertension   . Hyperlipidemia   . GERD (gastroesophageal reflux disease)   . Vitamin D deficiency   . History of colon polyps   . Complication of anesthesia     Difficult to wake up; and severe nausea and vomiting  . PONV (postoperative nausea and vomiting)    Past Surgical History:  Past Surgical History  Procedure Laterality Date  . Tonsillectomy    . Colonoscopy w/ polypectomy    . Esophagus surgery      Had esophagus stretched due to food getting trapped in his throat  . Craniotomy Bilateral 04/06/2014    Procedure: Bilateral Fronto-parietal craniotomy for resection of tumors with Curve;  Surgeon: Consuella Lose, MD;  Location: [facility];  Service: Neurosurgery;  Laterality: Bilateral;  Bilateral Fronto-parietal craniotomy for resection of tumors with Curve    Assessment & Plan Clinical Impression: Ivan Stark is a 68 y.o.right handed male with history of diabetes mellitus peripheral  neuropathy, hypertension. Admitted 04/06/2014 with instability of gait dating back to July drifting to the left. He also described dragging of his left leg. Denies any recent falls.MRI and imaging demonstrated multiple intracranial tumors consistent with meningioma.underwent stereotactic bilateral frontoparietal craniotomy and resection of left frontal extra-axial tumor as well as right frontal extra-axial tumor 04/06/2014 per Dr. Waynetta Sandy on Decadron protocol. Keppra added for seizure prophylaxis. He is tolerating a regular diet.  Patient transferred to [facility] on 04/11/2014 .    Patient currently requires max with basic self-care skills and IADL secondary to muscle weakness, abnormal tone and unbalanced muscle activation and decreased sitting balance, decreased standing balance, decreased postural control, hemiplegia and decreased balance strategies.  Prior to hospitalization, patient was active in the communitu, working full time as a Theme park manager, driving and independent with all BADL & IADL tasks.  Patient will benefit from skilled intervention to increase independence with basic self-care skills and increase level of independence with iADL prior to discharge home with care partner.  Anticipate patient will require 24 hour supervision and follow up home health and follow up outpatient.  OT - End of Session Activity Tolerance: Decreased this session Endurance Deficit: Yes Endurance Deficit Description: pt with SOB with exertion, requires verbal cues for breathing technique and rest breaks  OT Assessment Rehab Potential: Good Barriers to Discharge: Inaccessible home environment Barriers to Discharge Comments: patient will ask wife to take some measurements secondary to he is not sure if bathroom is accessible OT Patient demonstrates impairments in the following area(s): Balance;Endurance;Safety;Skin  Integrity OT Basic ADL's Functional Problem(s): Grooming;Bathing;Dressing;Toileting OT Advanced  ADL's Functional Problem(s): Simple Meal Preparation;Light Housekeeping OT Transfers Functional Problem(s): Toilet;Tub/Shower OT Plan OT Intensity: Minimum of 1-2 x/day, 45 to 90 minutes OT Frequency: 5 out of 7 days OT Duration/Estimated Length of Stay: 2 weeks OT Treatment/Interventions: Balance/vestibular training;Community reintegration;DME/adaptive equipment instruction;Discharge planning;Functional mobility training;Neuromuscular re-education;Psychosocial support;Patient/family education;Self Care/advanced ADL retraining;Skin care/wound managment;Therapeutic Activities;UE/LE Strength taining/ROM;Wheelchair propulsion/positioning OT Basic Self-Care Anticipated Outcome(s): Supervision OT Toileting Anticipated Outcome(s): Supervision OT Bathroom Transfers Anticipated Outcome(s): Supervision OT Recommendation Patient destination: Home Follow Up Recommendations: Home health OT;24 hour supervision/assistance Equipment Recommended: To be determined Equipment Details: anticipate need for 3 in 1 and tub bench  Skilled Therapeutic Intervention OT Evaluation, self care retraining to include shower (did not get head/incision wet), grooming and toileting.  On eval, patient did not have any clothes.  Focused session on activity tolerance, balance, safe transfers, collaborated on OT LT goals, reviewed the rehab process and patient agreed to ask his wife to measure doorways to determine accessibility.    OT Evaluation Precautions/Restrictions  Precautions Precautions: Fall Precaution Comments: posterior lean, weak LLE Restrictions Weight Bearing Restrictions: No Pain Denies pain Home Living/Prior Functioning Home Living Family/patient expects to be discharged to:: Private residence Living Arrangements: Spouse/significant other Available Help at Discharge: Family, Friend(s), Available 24 hours/day (local children and church members) Type of Home: House Home Access: Stairs to enter Engineer, site of Steps: 3 Home Layout: One level Additional Comments: walk SR:PRXY, tub/shower:curtain.  wife to bring in measurements to determine accessibility  Lives With: Spouse Prior Function Level of Independence: Independent with basic ADLs, Independent with homemaking with ambulation  Able to Take Stairs?: Reciprically Driving: Yes Vocation: Full time employment 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,  ADL Please refer to FIM below for details Vision/Perception  Vision- History Baseline Vision/History: No visual deficits;Wears glasses Wears Glasses: At all times Patient Visual Report: No change from baseline  Cognition Overall Cognitive Status: Within Functional Limits for tasks assessed Arousal/Alertness: Awake/alert Orientation Level: Oriented X4 Safety/Judgment: Appears intact Sensation Sensation Light Touch: Appears Intact Hot/Cold: Appears Intact Additional Comments: BUEs Coordination Coordination and Movement Description: BUEs WFL Motor  Motor Motor: Hemiplegia;Abnormal postural alignment and control Motor - Skilled Clinical Observations: LLE hemiplegia, thoracic kyphosis limiting upright posture  Mobility  Bed Mobility Transfers Transfers:  (Patient required max to total assist to lift and lower during squat pivot transfers) Sit to Stand: 3: Mod assist;With upper extremity assist;From chair/3-in-1 Sit to Stand Details: Verbal cues for technique;Verbal cues for precautions/safety;Manual facilitation for weight shifting Sit to Stand Details (indicate cue type and reason): pt with L UE on RW stabilized by therapist and pushing up with R UE from w/c  Stand to Sit: 3: Mod assist;With upper extremity assist;To chair/3-in-1 Stand to Sit Details (indicate cue type and reason): Verbal cues for technique;Manual facilitation for weight shifting  Trunk/Postural Assessment  Cervical Assessment Cervical Assessment: Exceptions to [facility] (forward head) Thoracic Assessment Thoracic  Assessment: Exceptions to [facility] (kyphotic) Lumbar Assessment Lumbar Assessment: Within Functional Limits Postural Control Postural Control: Deficits on evaluation Righting Reactions: impaired/delayed  Balance Balance Assessed: Yes (per PT) Static Sitting Balance Static Sitting - Balance Support: Bilateral upper extremity supported;Feet supported Static Sitting - Level of Assistance: 5: Stand by assistance Static Standing Balance Static Standing - Balance Support: Bilateral upper extremity supported;During functional activity Static Standing - Level of Assistance: 5: Stand by assistance;4: Min assist Dynamic Standing Balance Dynamic Standing - Balance Support: Bilateral upper extremity supported;During functional activity  Dynamic Standing - Level of Assistance: 2: Max assist;3: Mod assist Extremity/Trunk Assessment RUE Assessment RUE Assessment: Within Functional Limits LUE Assessment LUE Assessment: Within Functional Limits  FIM:  FIM - Eating Eating Activity: 7: Complete independence:no helper FIM - Grooming Grooming Steps: Wash, rinse, dry face;Wash, rinse, dry hands;Oral care, brush teeth, clean dentures Grooming: 4: Patient completes 3 of 4 or 4 of 5 steps FIM - Bathing Bathing Steps Patient Completed: Chest;Right Arm;Left Arm;Abdomen;Right upper leg;Left upper leg;Front perineal area;Right lower leg (including foot) Bathing: 4: Min-Patient completes 8-9 [id number] parts or 75+ percent FIM - Upper Body Dressing/Undressing Upper body dressing/undressing: 0: Wears gown/pajamas-no public clothing FIM - Lower Body Dressing/Undressing Lower body dressing/undressing steps patient completed: Don/Doff right sock;Don/Doff left sock FIM - Toileting Toileting Assistive Devices: Grab bar or rail for support Toileting: 1: Total-Patient completed zero steps, helper did all 3 FIM - Bed/Chair Transfer Bed/Chair Transfer: 3: Supine > Sit: Mod A (lifting assist/Pt. 50-74%/lift 2 legs;2: Bed >  Chair or W/C: Max A (lift and lower assist) FIM - TAir cabin crewTransfers Assistive Devices: Grab bars;Elevated toilet seat (w/c and 3in1 over commode) Toilet Transfers: 2-To toilet/BSC: Max A (lift and lower assist);2-From toilet/BSC: Max A (lift and lower assist) FIM - Tub/Shower Transfers Tub/Shower Assistive Devices: Tub transfer bench;Grab bars;Walk in shower (w/c) Tub/shower Transfers: 2-Into Tub/Shower: Max A (lift and lower assist);1-Out of Tub/Shower: Total A (helper does all/Pt. < 25%)   Refer to Care Plan for Long Term Goals  Recommendations for other services: None  Discharge Criteria: Patient will be discharged from OT if patient refuses treatment 3 consecutive times without medical reason, if treatment goals not met, if there is a change in medical status, if patient makes no progress towards goals or if patient is discharged from hospital.  The above assessment, treatment plan, treatment alternatives and goals were discussed and mutually agreed upon: by patient  Casmira Cramer 04/12/2014, 3:56 PM

## 2014-04-12 NOTE — Progress Notes (Signed)
At [name] Complained of feeling too "antsy" to sleep and LLE with spasms. PRN Xanax given, but not effective. At 0230, still Awake reading in bed. Patient feels prednisone keeping him awake. Ivan Stark A

## 2014-04-12 NOTE — Consult Note (Signed)
[place]  Telephone:([phone] [place] CONSULTATION NOTE  Ivan Stark                                MR#: [id number]  DOB: May 03, 1946                       CSN#: [id number]  Referring MD: Dr. Sheliah Pl[facility]  Primary MD: Dr.  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 male asked to see in consultation for evaluation of Small Cell Carcinoma.  In review, Ivan Stark to his PCP with 4 month history of progressive gait instability and left leg weakness. He denied any headaches, vision changes, urinary or bowel incontinence. He had no confusion or seizures. No falls.  He was referred to Neurosurgery as outpatient, after CT of the head on 02/08/14 revealed 3 intracranial lesions, including a heavily calcified small lesion at the bottom of the falx in the posterior frontal region.  MRI on 03/21/14 further delineates the above 3 lesions. The large left parasagittal lesion appeared to invade the superior sagittal sinus, and is homogeneously enhancing. There was no surrounding edema on the left. The right-sided lesion appeared to also be based on the falx, just above the corpus callosum. This was more heterogeneous in contrast enhancement, and did exhibit surrounding vasogenic edema.  These intracranial tumors were consistent with meningioma. Of note, chest x ray was negative for acute findings.  He underwent stereotactic bilateral frontoparietal craniotomy and resection of left frontal extra-axial tumor as well as right frontal extra-axial tumor 04/06/2014 per Dr. Kathyrn Sheriff. He was placed on Decadron protocol, and Keppra for seizure prophylaxis. Surgical pathology results of the right-sided tumor, case number [id number] was positive for metastatic small cell carcinoma;  The malignant cells are positive for CD56, chromogranin, NSE, EMA, synaptophysin, and focally positive for Cytokeratin AE1/AE3 and  TTF-1. Overall, this profile is consistent with metastatic small cell carcinoma and the focal TTF-1 staining suggests a lung primary.  resection of the remaining 2 lesions, were positive for meningioma, Grade 1.   Post op MRI on 10/31 was non diagnostic for new lesions, aute infarction adjacent to the resection cavity bilaterally and a  remote small areas of infarct in the right parietal lobe and bilateral frontal lobes with question emboli were seen.     Patient was transferred to the Rehab unit where he is to remain for about 10-14 more days. We were requested to see this patient with recommendations.        PMH:  Past Medical History  Diagnosis Date  . Diabetes mellitus without complication   . Hypertension   . Hyperlipidemia   . GERD (gastroesophageal reflux disease)   . Vitamin D deficiency   . History of colon polyps   . Complication of anesthesia     Difficult to wake up; and severe nausea and vomiting  . PONV (postoperative nausea and vomiting)     Surgeries:  Past Surgical History  Procedure Laterality Date  . Tonsillectomy    . Colonoscopy Ivan Stark    . Esophagus surgery      Had esophagus stretched due to food getting trapped in his throat  . Craniotomy Bilateral 04/06/2014    Procedure: Bilateral Fronto-parietal craniotomy for resection of tumors with Curve;  Surgeon: Ivan Lose,  MD;  Location: [facility];  Service: Neurosurgery;  Laterality: Bilateral;  Bilateral Fronto-parietal craniotomy for resection of tumors with Curve    Allergies:  Allergies  Allergen Reactions  . Atenolol Shortness Of Breath  . Lipitor [Atorvastatin]     Itch    Medications:  Scheduled Meds: . dexamethasone  4 mg Oral Q12H  . enalapril  20 mg Oral Daily  . enoxaparin (LOVENOX) injection  40 mg Subcutaneous Q24H  . glimepiride  2 mg Oral BID WC  . insulin aspart  0-20 Units Subcutaneous TID WC  . insulin detemir  15 Units Subcutaneous QHS  . levETIRAcetam  500 mg  Oral BID  . metFORMIN  1,000 mg Oral Q supper  . metFORMIN  500 mg Oral BID WC  . multivitamin with minerals  1 tablet Oral Daily  . pantoprazole  40 mg Oral QHS  . pravastatin  40 mg Oral q1800  . senna  2 tablet Oral BID   Continuous Infusions:  PRN Meds:.acetaminophen, ALPRAZolam, alum & mag hydroxide-simeth, bisacodyl, diphenhydrAMINE, guaiFENesin-dextromethorphan, HYDROcodone-acetaminophen, ondansetron **OR** ondansetron (ZOFRAN) IV, simethicone, sorbitol, traZODone   ROS: Constitutional: Denies fevers, chills or abnormal night sweats. denies weight loss Eyes: Denies blurriness of vision, double vision or watery eyes Ears, nose, mouth, throat, and face: Denies mucositis or sore throat Respiratory: Denies cough, dyspnea or wheezes Cardiovascular: Denies palpitation, chest discomfort or lower extremity swelling Gastrointestinal:  Denies nausea, heartburn, he has noticed constipation over the last couple of weeks, but no bowel incontinence. He denies risk factors for hepatitis. Denies appetite changes. Of note, he has a history of colon polyps, with negative colonoscopy performed a couple of years ago. No hematochezia.  GU: No urinary incontinence or retention Skin: Denies abnormal skin rashes, but he does have numerous moles. He denies any new lesions Lymphatics: Denies new lymphadenopathy or easy bruising Heme: He denies any bleeding issues.  Neurological: left leg weakness as per HPI. He denies any headaches, seizures, loss of consciousness. He denies any changes in sensation, but some intermittent tingling.  Behavioral/Psych: Mood is stable, no new changes  All other systems were reviewed with the patient and are negative.   Family History:    Family History  Problem Relation Age of Onset  . Goiter Mother   . Hypertension Father   . Cancer Father     colon and lung   2 sisters in good health.  No family history of hematological  disorders. No history of thyroid or kidney  cancer in the family.   Social History:  reports that he has never smoked. He does not have any smokeless tobacco history on file. He reports that he does not drink alcohol or use illicit drugs. He is married, 3 children in good health. He lives in [place], works as a Theme park manager.    Physical Exam   ECOG PERFORMANCE STATUS:  3  Symptomatic, >50% in bed, but not bedbound (Capable of only limited self-care, confined to bed or chair 50% or more of waking hours)    Filed Vitals:   04/12/14 0533  BP: 111/64  Pulse: 65  Temp: 97.8 F (36.6 C)  Resp: 18   Filed Weights   04/11/14 1654  Weight: 201 lb 12.8 oz (91.536 kg)    GENERAL:alert, no distress and comfortable SKIN:  Well healed parietofrontal scar with staples present. skin color, texture, turgor are normal, no rashes, but multiple nevi and keratotic areas. there is a sebaceous cyst in the left lower back region  of about 2.5 cm. EYES: normal, conjunctiva are pink and non-injected, sclera clear. No ptosis.  OROPHARYNX:no exudate, no erythema and lips, buccal mucosa, and tongue normal  NECK: supple, thyroid normal size, non-tender, without nodularity LYMPH:  no palpable lymphadenopathy in the cervical, supraclavicular or axillary or inguinal regions LUNGS: clear to auscultation and percussion with normal breathing effort HEART: regular rate & rhythm and no murmurs and no lower extremity edema ABDOMEN:abdomen soft, non-tender and normal bowel sounds. Left inguinal hernia present.  Musculoskeletal:no cyanosis of digits and no clubbing .  PSYCH: alert & oriented x 3 with fluent speech NEURO:Left lower extremity 1/5 strength. Sensation intact. rest of neuro exam unremarkable  CBC  Recent Labs Lab 04/06/14 1214 04/06/14 1220 04/06/14 1610 04/07/14 0625 04/12/14 0430  WBC  --   --  13.9* 23.5* 10.3  HGB 11.6* 11.9* 11.4* 10.9* 11.4*  HCT 34.0* 35.0* 34.0* 33.0* 34.3*  PLT  --   --  188 200 306  MCV  --   --  90.7 90.2 89.3    MCH  --   --  30.4 29.8 29.7  MCHC  --   --  33.5 33.0 33.2  RDW  --   --  13.3 13.4 13.3  LYMPHSABS  --   --   --   --  1.5  MONOABS  --   --   --   --  0.9  EOSABS  --   --   --   --  0.0  BASOSABS  --   --   --   --  0.0    Anemia panel:  No results for input(s): VITAMINB12, FOLATE, FERRITIN, TIBC, IRON, RETICCTPCT in the last 72 hours.  CMP    Recent Labs Lab 04/06/14 0848  04/06/14 1020 04/06/14 1028 04/06/14 1214 04/06/14 1220 04/06/14 1610 04/07/14 0625 04/12/14 0430  NA 137  < > 138  --  141 141  --  140 140  K 4.2  < > 4.6  --  4.2 4.2  --  4.2 4.5  CL  --   --   --   --   --   --   --  106 102  CO2  --   --   --   --   --   --   --  22 24  GLUCOSE 194*  --   --  233*  --  261*  --  266* 136*  BUN  --   --   --   --   --   --   --  9 20  CREATININE  --   --   --   --   --   --  0.86 0.84 0.82  CALCIUM  --   --   --   --   --   --   --  8.7 8.9  AST  --   --   --   --   --   --   --   --  13  ALT  --   --   --   --   --   --   --   --  12  ALKPHOS  --   --   --   --   --   --   --   --  55  BILITOT  --   --   --   --   --   --   --   --  0.4  < > = values in this interval not displayed.      Component Value Date/Time   BILITOT 0.4 04/12/2014 0430   BILIDIR 0.1 02/05/2014 1305   IBILI 0.6 02/05/2014 1305     No results for input(s): INR, PROTIME in the last 168 hours.  No results for input(s): DDIMER in the last 72 hours.  Imaging Studies:  Dg Chest 2 View  03/29/2014   CLINICAL DATA:  Preoperative evaluation for frontal parietal craniotomy  EXAM: CHEST  2 VIEW  COMPARISON:  07/31/2012  FINDINGS: Cardiac shadow is within normal limits. The thoracic aorta is tortuous. The lungs are well aerated bilaterally with mild interstitial changes. No acute infiltrate or effusion is noted. No acute bony abnormality is noted.  IMPRESSION: No active cardiopulmonary disease.   Electronically Signed   By: Inez Catalina M.D.   On: 03/29/2014 11:14   [name] ZO  Contrast  04/07/2014    COMPARISON: MRI 03/21/2014  FINDINGS: Postop day 1 bilateral parietal craniotomy for bilateral meningioma resection.  Right parasagittal meningioma resection. Surgical cavity is filled with blood on the right. There is enhancement of the wall of the surgical cavity without masslike features. Although it is only been 24 hr, this enhancement pattern may be postop enhancement rather than residual tumor. Continued close follow-up recommended.  Left parasagittal meningioma resection. Postop surgical cavity containing blood. Mild peripheral enhancement around the cavity similar that seen on the right may be postop enhancement.  Diffusion-weighted imaging reveals several areas of restricted diffusion consistent with acute infarct. Small area of acute infarction in the right parietal cortex, several cm from the resection site. Small areas of restricted diffusion in the frontal lobes bilaterally compatible with small areas of infarction. Restricted diffusion is also present over the convexity bilaterally near the resection site most consistent with infarct adjacent to the tumor resection site. This measures approximately 10 x 15 mm on the right and 15 mm by 20 mm on the left.  Calcified meningioma along the falx measuring approximate 1 cm is unchanged and was not resected.  Ventricle size is normal. No shift of the midline structures.  IMPRESSION: Postop day 1 resection of bilateral parasagittal meningioma is in the parietal lobe. Post surgical cavity filled with blood bilaterally. There is thin linear enhancement along the wall of the surgical cavity bilaterally likely due to early postoperative enhancement. Continued followup is suggested to rule out residual tumor.  Acute infarction adjacent to the resection cavity bilaterally. Also remote small areas of infarct in the right parietal lobe and bilateral frontal lobes. Question emboli   Electronically Signed   By: Franchot Gallo M.D.   On:  04/07/2014 20:44   [name] Wo Contrast  C 03/21/2014 COMPARISON:  MR brain 02/19/2014.  CT head 02/08/2014.  FINDINGS: Redemonstrated are 3 intracranial mass lesions consistent with meningiomas.  The largest involves the LEFT posterior frontal parasagittal falx, growing into the superior sagittal sinus, measuring 21 x 31 x 28 mm. Mild compression of the surrounding brain without significant vasogenic edema. Restricted diffusion with only minimal hemorrhage or mineralization.  Next largest involves the posterior medial parasagittal RIGHT frontal lobe, with central necrosis, probably extra-axial given its large border with falx but different from the first lesion in that there is moderate surrounding vasogenic edema and some cystic necrosis. Unchanged size measuring 18 x 32 x 26 mm. Restricted diffusion with moderate intra tumoral hemorrhage or mineralization.  Smallest lesion projects from the inferior falx centrally,  roughly 1 cm in size, heavily calcified, also consistent with a meningioma.  Unchanged appearance of the surrounding brain and extracranial soft tissues.  IMPRESSION: Findings most consistent with 3 extra-axial masses, likely meningiomas. Ingrowth of tumor into the superior sagittal sinus related to the largest LEFT posterior frontal parasagittal lesion. See discussion above.   Electronically Signed   By: Rolla Flatten M.D.   On: 03/21/2014 10:57   Dg Chest Port 1 View  04/06/2014 COMPARISON:  03/29/2014  FINDINGS: 1355 hrs. Right IJ central line is new in the interval with tip position overlying the proximal to mid SVC level. Patient is rotated to the right, displacing cardio mediastinal anatomy into the medial right hemi thorax. No evidence for pneumothorax or pleural effusion. Vascular congestion is evident without airspace pulmonary edema. Cardiopericardial silhouette is at upper limits of normal for size. Imaged bony structures of the thorax are intact.  IMPRESSION: No evidence for  pneumothorax status post central line placement.   Electronically Signed   By: Misty Stanley M.D.   On: 04/06/2014 14:22   Patient: JASYN, MEY Collected: 04/06/2014 Client: [facility] Accession: [id number] Received: 04/06/2014 Ivan Lose, MD DOB: [birth year] Age: 79 Gender: M Reported: 04/11/2014 [address] Patient Ph: [phone] MRN #: [id number] [place], [place] [address] Visit #: [id number] Chart #: Phone: Fax: CC: REPORT OF SURGICAL PATHOLOGY FINAL DIAGNOSIS Diagnosis 1. Brain, for tumor resection, right frontal - METASTATIC SMALL CELL CARCINOMA. - SEE COMMENT. 2. Brain, for tumor resection, left frontal - MENINGIOMA, WHO GRADE I. - SEE ONCOLOGY TABLE BELOW. Microscopic Comment 1. The malignant cells are positive for CD56, chromogranin, NSE, EMA, synaptophysin, and focally positive for Cytokeratin AE1/AE3 and TTF-1. Overall, this profile is consistent with metastatic small cell carcinoma and the focal TTF-1 staining suggests a lung primary. Dr Mali Rund has reviewed part 1 and concurs with this interpretation. (JBK:ecj 04/10/2014)    A/P: 68 y.o. male with 1.metastatic small cell carcinoma involving a right frontal brain mass, status post resection 04/06/2014  2. Left frontal meningioma, WHO grade 1, status post resection 04/06/2014   3. Left leg weakness secondary to #1.   4. 1 cm remaining brain lesion at the inferior falx noted on the MRI 03/21/2014, not resected  5. Diabetes  6. Hypertension  7. Hyperlipidemia   Rondel Jumbo, PA-C 04/12/2014 2:30 PM  Mr. Erekson was interviewed and examined. He has been diagnosed with metastatic small cell carcinoma involving a right frontal brain mass. He is symptomatic with profound left leg weakness and is now admitted to the rehabilitation medicine service. An admission chest x-ray was unremarkable and there is no physical exam evidence of a primary tumor site. He is a nonsmoker.  The differential  diagnosis includes metastatic small cell carcinoma of the lung and extrapulmonic small cell carcinoma.  Recommendations: 1. Staging CTs of the chest, abdomen, and pelvis 2. Radiation oncology consultation-I discussed the case with Dr. Tammi Klippel and he will arrange for radiation oncology follow-up 3. Continue postoperative care per Dr. Kathyrn Sheriff and physical therapy with Dr. Tessa Lerner 4. I will follow up on the staging CT scans and arrange for an outpatient appointment at the Cancer center. 5. I will check on Mr. Chandley during the week of 04/16/2014. Please call oncology as needed.

## 2014-04-12 NOTE — Evaluation (Signed)
Physical Therapy Assessment and Plan  Patient Details  Name: Ivan Stark MRN: [id number] Date of Birth: 04-16-1946  PT Diagnosis: Abnormal posture, Abnormality of gait, Hemiplegia dominant and Muscle weakness Rehab Potential: Good ELOS: 14 to 16 days   Today's Date: 04/12/2014 PT Individual Time: 1100-1200   60 min    Problem List:  Patient Active Problem List   Diagnosis Date Noted  . Metastatic cancer to brain 04/11/2014  . Cerebral meningioma   . Left flaccid hemiparesis   . Left hemiparesis 04/09/2014  . 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 Poor compliance with F/U 02/04/2014  . 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 Encounter for long-term (current) use of other medications 08/20/2013  . Hypertension   . Hyperlipidemia   . GERD (gastroesophageal reflux disease)   . Vitamin D deficiency   . History of colon polyps     Past Medical History:  Past Medical History  Diagnosis Date  . Diabetes mellitus without complication   . Hypertension   . Hyperlipidemia   . GERD (gastroesophageal reflux disease)   . Vitamin D deficiency   . History of colon polyps   . Complication of anesthesia     Difficult to wake up; and severe nausea and vomiting  . PONV (postoperative nausea and vomiting)    Past Surgical History:  Past Surgical History  Procedure Laterality Date  . Tonsillectomy    . Colonoscopy w/ polypectomy    . Esophagus surgery      Had esophagus stretched due to food getting trapped in his throat  . Craniotomy Bilateral 04/06/2014    Procedure: Bilateral Fronto-parietal craniotomy for resection of tumors with Curve;  Surgeon: Consuella Lose, MD;  Location: [facility];  Service: Neurosurgery;  Laterality: Bilateral;  Bilateral Fronto-parietal craniotomy for resection of tumors with Curve    Assessment & Plan Clinical Impression: Ivan Stark is a 68 y.o. H/o DM type 2, HTN, gait instability with LLE weakness since July 2015 and MRI of brain revealed three  intracranial tumors consistent with meningioma and right sided lesion with edema likely responsible for symptoms. He was evaluated by Dr. Kathyrn Sheriff and admitted on 04/06/14 for stereotactic bilateral frontoparietal craniotomy with resection of left and right frontal tumor. Post op leg weakness treated with decadron. 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 He has had elevated BS due to steroids and was started on levemir as well as decadron taper. He has had some improvement in LE strength and therapy ongoing. 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 Patient with flexor tone LLE as well as poor safety and impulsivity. Patient transferred to CIR on 04/11/2014 .   Patient currently requires mod to max with mobility secondary to muscle weakness, decreased cardiorespiratoy endurance and impaired timing and sequencing, abnormal tone and unbalanced muscle activation.  Prior to hospitalization, patient was independent  with mobility and lived with Spouse in a House home.  Home access is 3Stairs to enter.  Patient will benefit from skilled PT intervention to maximize safe functional mobility, minimize fall risk and decrease caregiver burden for planned discharge home with 24 hour supervision.  Anticipate patient will benefit from follow up [facility] at discharge.  PT - End of Session Activity Tolerance: Tolerates 30+ min activity with multiple rests Endurance Deficit: Yes Endurance Deficit Description: pt with SOB with exertion, requires verbal cues for breathing  technique and rest breaks  PT Assessment Rehab Potential: Good Barriers to Discharge: Inaccessible home environment Barriers to Discharge Comments: 3 steps to enter, recommended installation of rails and pt in agreement PT Patient demonstrates impairments in the following area(s):  Balance;Behavior;Endurance;Motor;Nutrition;Perception;Safety;Skin Integrity;Sensory PT Transfers Functional Problem(s): Bed Mobility;Bed to Chair;Car;Furniture PT Locomotion Functional Problem(s): Ambulation;Wheelchair Mobility;Stairs PT Plan PT Intensity: Minimum of 1-2 x/day ,45 to 90 minutes PT Frequency: 5 out of 7 days PT Duration Estimated Length of Stay: 14 to 16 days PT Treatment/Interventions: Ambulation/gait training;Balance/vestibular training;Discharge planning;DME/adaptive equipment instruction;Functional mobility training;Neuromuscular re-education;Functional electrical stimulation;Patient/family education;Psychosocial support;Stair training;Splinting/orthotics;Therapeutic Activities;Therapeutic Exercise;UE/LE Strength taining/ROM;UE/LE Coordination activities;Wheelchair propulsion/positioning PT Transfers Anticipated Outcome(s): supervision PT Locomotion Anticipated Outcome(s): supervision-min A PT Recommendation Recommendations for Other Services: Neuropsych consult Follow Up Recommendations: Home health PT Patient destination: Home Equipment Recommended: To be determined  Skilled Therapeutic Intervention Skilled therapeutic intervention initiated after completion of evaluation. Discussed with patient falls risk, safety within room, and focus of therapy during stay. Discussed possible LOS, goals, and f/u therapy.   PT Evaluation Precautions/Restrictions Precautions Precautions: Fall Precaution Comments: LLE hemiplegia Restrictions Weight Bearing Restrictions: No General Chart Reviewed: Yes Family/Caregiver Present: No Vital Signs  Pain Pain Assessment Pain Assessment: No/denies pain Home Living/Prior Functioning Home Living Available Help at Discharge: Family;Friend(s);Available 24 hours/day (local children and church members) Type of Home: House Home Access: Stairs to enter [employer] of Steps: 3 Entrance Stairs-Rails: None Home Layout: One  level Additional Comments: walk BL:[id number], tub/shower:curtain. wife to bring in measurements to determine accessibility  Lives With: Spouse Prior Function Level of Independence: Independent with basic ADLs;Independent with homemaking with ambulation;Independent with gait;Independent with transfers  Able to Take Stairs?: Reciprically Driving: Yes Vocation: Full time employment Leisure: Hobbies-yes (Comment) 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 in [place], likes to watch movies, yardwork, being outdoors Vision/Perception   No changes from baseline  Cognition Overall Cognitive Status: Within Functional Limits for tasks assessed Arousal/Alertness: Awake/alert Orientation Level: Oriented X4 Safety/Judgment: Appears intact Sensation Sensation Light Touch: Appears Intact Hot/Cold: Appears Intact Coordination Gross Motor Movements are Fluid and Coordinated: No Fine Motor Movements are Fluid and Coordinated: Yes Coordination and Movement Description: LLE hemiplegia resulting in heavy dependence on UEs for mobility  Heel Shin Test: unable to perform with LLE Motor  Motor Motor: Hemiplegia;Abnormal postural alignment and control Motor - Skilled Clinical Observations: LLE hemiplegia, thoracic kyphosis limiting upright posture   Mobility Bed Mobility Bed Mobility: Supine to Sit Supine to Sit: 3: Mod assist Transfers Transfers: Yes Sit to Stand: 3: Mod assist;With upper extremity assist;From chair/3-in-1 Sit to Stand Details: Verbal cues for technique;Verbal cues for precautions/safety;Manual facilitation for weight shifting Sit to Stand Details (indicate cue type and reason): pt with LUE on RW stabilized by therapist and pushing up with RUE from w/c Stand to Sit: 3: Mod assist;With upper extremity assist;To chair/3-in-1 Stand to Sit Details (indicate cue type and reason): Verbal cues for technique;Manual facilitation for weight shifting Squat Pivot Transfers: 3: Mod assist;2: Max assist;With upper  extremity assistance Squat Pivot Transfer Details: Verbal cues for technique;Manual facilitation for weight shifting;Tactile cues for sequencing;Verbal cues for sequencing;Verbal cues for precautions/safety Locomotion  Ambulation Ambulation: Yes Ambulation/Gait Assistance: 1: +2 Total assist;2: Max assist;3: Mod assist Ambulation Distance (Feet): 15 Feet Assistive device: Rolling walker Ambulation/Gait Assistance Details: Manual facilitation for placement;Manual facilitation for weight shifting;Manual facilitation for weight bearing;Verbal cues for sequencing;Verbal cues for technique;Verbal cues for precautions/safety;Verbal cues for safe use of DME/AE;Tactile cues for weight shifting;Tactile cues for initiation Ambulation/Gait Assistance Details:  supervision to max A with increased fatigue for LLE advancement/placement Gait Gait: Yes Gait Pattern: Impaired Gait Pattern: Decreased step length - left;Step-through pattern;Decreased stance time - left;Decreased stride length;Decreased hip/knee flexion - left;Decreased dorsiflexion - left;Decreased weight shift to left;Left hip hike;Left foot flat;Left genu recurvatum;Decreased trunk rotation;Narrow base of support;Trunk flexed Gait velocity: significantly impaired Stairs / Additional Locomotion Stairs: Yes Stairs Assistance: 1: +2 Total assist;2: Max assist Stairs Assistance Details: Verbal cues for technique;Verbal cues for sequencing;Manual facilitation for weight shifting;Manual facilitation for placement;Manual facilitation for weight bearing Stairs Assistance Details (indicate cue type and reason): total A to advance and stabilize LLE to prevent buckling when stepping with RLE Stair Management Technique: Two rails;Step to pattern;Forwards Number of Stairs: 3 Height of Stairs: 5 Architect: Yes Wheelchair Assistance: 5: Careers information officer: Both upper extremities Wheelchair Parts Management:  Needs assistance Distance: 150 ft  Trunk/Postural Assessment  Cervical Assessment Cervical Assessment: Exceptions to [facility] (forward head) Thoracic Assessment Thoracic Assessment: Exceptions to [facility] (kyphotic) Lumbar Assessment Lumbar Assessment: Within Functional Limits Postural Control Postural Control: Deficits on evaluation Righting Reactions: impaired/delayed  Balance Balance Balance Assessed: Yes Static Sitting Balance Static Sitting - Balance Support: Bilateral upper extremity supported;Feet supported Static Sitting - Level of Assistance: 5: Stand by assistance Static Sitting - Comment/# of Minutes: pt with posterior LOB with UE support removed  Static Standing Balance Static Standing - Balance Support: Bilateral upper extremity supported;During functional activity Static Standing - Level of Assistance: 5: Stand by assistance;4: Min assist Dynamic Standing Balance Dynamic Standing - Balance Support: Bilateral upper extremity supported;During functional activity Dynamic Standing - Level of Assistance: 2: Max assist;3: Mod assist Extremity Assessment  RLE Assessment RLE Assessment: Exceptions to [facility] RLE Strength RLE Overall Strength: Deficits RLE Overall Strength Comments: grossly 4 to 4+/5 except hip flexion 4-/5 LLE Assessment LLE Assessment: Exceptions to WFL LLE Strength LLE Overall Strength: Deficits LLE Overall Strength Comments: 0/5 throughout except trace 1/5 hip flexion and ankle PF   FIM:  FIM - Bed/Chair Transfer Bed/Chair Transfer: 2: Chair or W/C > Bed: Max A (lift and lower assist);2: Bed > Chair or W/C: Max A (lift and lower assist) FIM - Locomotion: Wheelchair Locomotion: Wheelchair: 5: Travels 150 ft or more: maneuvers on rugs and over door sills with supervision, cueing or coaxing FIM - Locomotion: Ambulation Locomotion: Ambulation Assistive Devices: Administrator Ambulation/Gait Assistance: 1: +2 Total assist;2: Max assist;3: Mod assist Locomotion:  Ambulation: 1: Two helpers FIM - Locomotion: Stairs Locomotion: Scientist, physiological: Insurance account manager - 2 Locomotion: Stairs: 1: Two helpers   Refer to [employer] for Long Term Goals  Recommendations for other services: Neuropsych  Discharge Criteria: Patient will be discharged from PT if patient refuses treatment 3 consecutive times without medical reason, if treatment goals not met, if there is a change in medical status, if patient makes no progress towards goals or if patient is discharged from hospital.  The above assessment, treatment plan, treatment alternatives and goals were discussed and mutually agreed upon: by patient  Laretta Alstrom 04/12/2014, 8:41 PM

## 2014-04-12 NOTE — Progress Notes (Signed)
[place] PHYSICAL MEDICINE & REHABILITATION     PROGRESS NOTE    Subjective/Complaints:   Objective: Vital Signs: Blood pressure 111/64, pulse 65, temperature 97.8 F (36.6 C), temperature source Oral, resp. rate 18, height 6\' 4"  (1.93 m), weight 91.536 kg (201 lb 12.8 oz), SpO2 99 %. No results found.  Recent Labs  04/12/14 0430  WBC 10.3  HGB 11.4*  HCT 34.3*  PLT 306    Recent Labs  04/12/14 0430  NA 140  K 4.5  CL 102  GLUCOSE 136*  BUN 20  CREATININE 0.82  CALCIUM 8.9   CBG (last 3)   Recent Labs  04/11/14 1808 04/11/14 2158 04/12/14 0643  GLUCAP 97 97 141*    Wt Readings from Last 3 Encounters:  04/11/14 91.536 kg (201 lb 12.8 oz)  04/06/14 94.8 kg (208 lb 15.9 oz)  03/29/14 95.981 kg (211 lb 9.6 oz)    Physical Exam:  Constitutional: He is oriented to person, place, and time. He appears well-developed.  HENT: oral mucosa pink and moist Craniotomy site with staples intact. clean Eyes: EOM are normal.  Neck: Normal range of motion. Neck supple. No thyromegaly present.  Cardiovascular: Normal rate and regular rhythm.  Respiratory: Effort normal and breath sounds normal. No respiratory distress.  GI: Soft. Bowel sounds are normal. He exhibits no distension.  Neurological: He is alert and oriented to person, place, and time.  Follows commands. Good insight and awareness. Memory intact. Language normal.no CN deficits.  Right upper extremity 5/5 deltoid, biceps, triceps, grip, left upper extremity 4+/5 in the deltoid, bicep, tricep, grip. 4/5 right hip flexor knee extensor and ankle dorsiflexor plantar flexor. Left lower extremity Trace to 1/5 hip flexor knee extensor, 0/5 ankle dorsiflexor plantar flexor Sensation sl diminished to LT left arm and leg. Mood and affect are appropriate No left neglect. No abnormal tone.  Skin: Skin is warm and dry.  Psych: mood pleasant and up beat  Assessment/Plan: 1. Functional deficits secondary to  right frontal meningioma, metastatic lung cancer which require 3+ hours per day of interdisciplinary therapy in a comprehensive inpatient rehab setting. Physiatrist is providing close team supervision and 24 hour management of active medical problems listed below. Physiatrist and rehab team continue to assess barriers to discharge/monitor patient progress toward functional and medical goals. FIM:                   Comprehension Comprehension Mode: Auditory Comprehension: 7-Follows complex conversation/direction: With no assist     Social Interaction Social Interaction: 6-Interacts appropriately with others with medication or extra time (anti-anxiety, antidepressant).     Memory Memory Mode: Asleep Memory: 4-Recognizes or recalls 75 - 89% of the time/requires cueing 10 - 24% of the time   Medical Problem List and Plan: 1. Functional deficits secondary to Right frontal meningioma status post resection with left hemiparesis 2. DVT Prophylaxis/Anticoagulation: Pharmaceutical: Lovenox--cleared to initiate per Dr. Kathyrn Sheriff 3. Pain Management: Will continue hydrocodone prn.  4. Mood: Reports anxiety/restlessness due to steroids.   low dose xanax prn. LCSW to follow for evaluation and support.  5. Neuropsych: This patient is capable of making decisions on his own behalf. 6. Skin/Wound Care: Routine pressure relief measures.  7. Fluids/Electrolytes/Nutrition: Monitor intake. Offer supplements as needed to maintain adequate intake.  8. DM type 2: Monitor BS ac/hs and continue Levemir for now. ON amaryl and metformin bid additionally.  9. Seizure prophylaxis: Continue Keppra bid.  10. HTN: Continue vasotec daily. Monitor BP every 8  hours and adjust as needed.  11. Metastatic Lung cancer: Patient aware and Hem/onc to be consulted for work up and input.  12 Constipation: augment regimen today if no bm  LOS (Days) 1 A FACE TO FACE EVALUATION WAS PERFORMED  Amorette Charrette  T 04/12/2014 8:03 AM

## 2014-04-13 ENCOUNTER — Encounter (HOSPITAL_COMMUNITY): Payer: Medicare Other

## 2014-04-13 ENCOUNTER — Inpatient Hospital Stay (HOSPITAL_COMMUNITY): Payer: Medicare Other

## 2014-04-13 LAB — GLUCOSE, CAPILLARY
GLUCOSE-CAPILLARY: 107 mg/dL — AB (ref 70–99)
Glucose-Capillary: 159 mg/dL — ABNORMAL HIGH (ref 70–99)
Glucose-Capillary: 143 mg/dL — ABNORMAL HIGH (ref 70–99)

## 2014-04-13 MED ORDER — INSULIN ASPART 100 UNIT/ML ~~LOC~~ SOLN
3.0000 [IU] | Freq: Three times a day (TID) | SUBCUTANEOUS | Status: AC
  Administered 2014-04-13 – 2014-04-15 (×6): 3 [IU] via SUBCUTANEOUS

## 2014-04-13 MED ORDER — METFORMIN HCL ER 500 MG PO TB24
500.0000 mg | ORAL_TABLET | Freq: Two times a day (BID) | ORAL | Status: DC
  Administered 2014-04-15 – 2014-04-28 (×22): 500 mg via ORAL
  Filled 2014-04-13 (×29): qty 1

## 2014-04-13 MED ORDER — CALCIUM CARBONATE-VITAMIN D 500-200 MG-UNIT PO TABS
1.0000 | ORAL_TABLET | Freq: Two times a day (BID) | ORAL | Status: DC
  Administered 2014-04-13 – 2014-04-28 (×30): 1 via ORAL
  Filled 2014-04-13 (×33): qty 1

## 2014-04-13 MED ORDER — PRO-STAT SUGAR FREE PO LIQD
30.0000 mL | Freq: Three times a day (TID) | ORAL | Status: DC
  Administered 2014-04-13 – 2014-04-28 (×40): 30 mL via ORAL
  Filled 2014-04-13 (×48): qty 30

## 2014-04-13 MED ORDER — METFORMIN HCL ER 500 MG PO TB24
1000.0000 mg | ORAL_TABLET | Freq: Every day | ORAL | Status: DC
  Administered 2014-04-15 – 2014-04-27 (×12): 1000 mg via ORAL
  Filled 2014-04-13 (×14): qty 2

## 2014-04-13 MED ORDER — LEVOFLOXACIN 500 MG PO TABS
500.0000 mg | ORAL_TABLET | Freq: Every day | ORAL | Status: DC
  Administered 2014-04-13 – 2014-04-18 (×6): 500 mg via ORAL
  Filled 2014-04-13 (×8): qty 1

## 2014-04-13 NOTE — Progress Notes (Signed)
[place] PHYSICAL MEDICINE & REHABILITATION     PROGRESS NOTE    Subjective/Complaints: Feels remarkably well this morning. Had long/busy day yesterday. Moved bowels. No urinary complaints. Denies pain.  Objective: Vital Signs: Blood pressure 120/67, pulse 84, temperature 98.1 F (36.7 C), temperature source Oral, resp. rate 18, height 6\' 4"  (1.93 m), weight 91.536 kg (201 lb 12.8 oz), SpO2 98 %. No results found.  Recent Labs  04/12/14 0430  WBC 10.3  HGB 11.4*  HCT 34.3*  PLT 306    Recent Labs  04/12/14 0430  NA 140  K 4.5  CL 102  GLUCOSE 136*  BUN 20  CREATININE 0.82  CALCIUM 8.9   CBG (last 3)   Recent Labs  04/12/14 1201 04/12/14 1643 04/12/14 2037  GLUCAP 91 130* 152*    Wt Readings from Last 3 Encounters:  04/11/14 91.536 kg (201 lb 12.8 oz)  04/06/14 94.8 kg (208 lb 15.9 oz)  03/29/14 95.981 kg (211 lb 9.6 oz)    Physical Exam:  Constitutional: He is oriented to person, place, and time. He appears well-developed.  HENT: oral mucosa pink and moist Craniotomy site with staples intact. Clean. Old blood around incision Eyes: EOM are normal.  Neck: Normal range of motion. Neck supple. No thyromegaly present.  Cardiovascular: Normal rate and regular rhythm.  Respiratory: Effort normal and breath sounds normal. No respiratory distress.  GI: Soft. Bowel sounds are normal. He exhibits no distension.  Neurological: He is alert and oriented to person, place, and time.  Follows commands. Good insight and awareness. Memory intact. Language normal.no CN deficits.  Right upper extremity 5/5 deltoid, biceps, triceps, grip, left upper extremity 4+/5 in the deltoid, bicep, tricep, grip. 4/5 right hip flexor knee extensor and ankle dorsiflexor plantar flexor. Left lower extremity Trace to 1/5 hip flexor knee extensor, 0/5 ankle dorsiflexor plantar flexor Sensation sl diminished to LT left arm and leg. Mood and affect are appropriate No left neglect.  No abnormal tone.  Skin: Skin is warm and dry.  Psych: mood remains pleasant and up beat  Assessment/Plan: 1. Functional deficits secondary to right frontal meningioma, metastatic lung cancer which require 3+ hours per day of interdisciplinary therapy in a comprehensive inpatient rehab setting. Physiatrist is providing close team supervision and 24 hour management of active medical problems listed below. Physiatrist and rehab team continue to assess barriers to discharge/monitor patient progress toward functional and medical goals. FIM: FIM - Bathing Bathing Steps Patient Completed: Chest, Right Arm, Left Arm, Abdomen, Right upper leg, Left upper leg, Front perineal area, Right lower leg (including foot) Bathing: 4: Min-Patient completes 8-9 41f 10 parts or 75+ percent  FIM - Upper Body Dressing/Undressing Upper body dressing/undressing: 0: Wears gown/pajamas-no public clothing FIM - Lower Body Dressing/Undressing Lower body dressing/undressing steps patient completed: Don/Doff right sock, Don/Doff left sock Lower body dressing/undressing: 1: Total-Patient completed less than 25% of tasks  FIM - Musician Devices: Grab bar or rail for support Toileting: 1: Total-Patient completed zero steps, helper did all 3  FIM - Radio producer Devices: Grab bars, Elevated toilet seat (w/c and 3in1 over commode) Toilet Transfers: 2-To toilet/BSC: Max A (lift and lower assist), 2-From toilet/BSC: Max A (lift and lower assist)  FIM - Bed/Chair Transfer Bed/Chair Transfer: 2: Chair or W/C > Bed: Max A (lift and lower assist), 2: Bed > Chair or W/C: Max A (lift and lower assist)  FIM - Locomotion: Wheelchair Distance: 150 Locomotion: Wheelchair: 5: [employer]  150 ft or more: maneuvers on rugs and over door sills with supervision, cueing or coaxing FIM - Locomotion: Ambulation Locomotion: Ambulation Assistive Devices: Administrator Ambulation/Gait  Assistance: 1: +2 Total assist, 2: Max assist, 3: Mod assist Locomotion: Ambulation: 1: Two helpers  Comprehension Comprehension Mode: Auditory Comprehension: 7-Follows complex conversation/direction: With no assist  Expression Expression Mode: Verbal Expression: 6-Expresses complex ideas: With extra time/assistive device  Social Interaction Social Interaction: 6-Interacts appropriately with others with medication or extra time (anti-anxiety, antidepressant).  Problem Solving Problem Solving: 5-Solves basic problems: With no assist  Memory Memory Mode: Asleep Memory: 6-More than reasonable amt of time   Medical Problem List and Plan: 1. Functional deficits secondary to Right frontal meningioma status post resection with left hemiparesis 2. DVT Prophylaxis/Anticoagulation: Pharmaceutical: Lovenox--cleared to initiate per Dr. Kathyrn Sheriff 3. Pain Management: Will continue hydrocodone prn.  4. Mood: Reports anxiety/restlessness due to steroids.   low dose xanax prn. LCSW to follow for evaluation and support.  5. Neuropsych: This patient is capable of making decisions on his own behalf. 6. Skin/Wound Care: Routine pressure relief measures.  7. Fluids/Electrolytes/Nutrition: Monitor intake. Offer supplements as needed to maintain adequate intake.  8. DM type 2: Monitor BS ac/hs and continue Levemir for now. ON amaryl and metformin bid additionally.  9. Seizure prophylaxis: Continue Keppra bid.  10. HTN: Continue vasotec daily. Monitor BP every 8 hours and adjust as needed.   12 Constipation: moved bowels yesterday 13. Oncology: likely mass prostrate on CT--radiology reading of CT pending. ?small cell prostate cancer?  -spoke with Dr. Benay Spice this morning. Will ask for urology assessment  -rad/onc following  -pt denies any voiding issues at present  LOS (Days) 2 A FACE TO FACE EVALUATION WAS PERFORMED  SWARTZ,ZACHARY T 04/13/2014 7:50 AM

## 2014-04-13 NOTE — Progress Notes (Signed)
Physical Therapy Session Note  Patient Details  Name: JAKOBE BLAU MRN: [id number] Date of Birth: [birth year]  Today's Date: 04/13/2014 PT Missed Time: 38 Minutes Missed Time Reason: Patient fatigue  Skilled Therapeutic Interventions/Progress Updates:  Pt received semi-reclined in bed lightly resting, easy to wake. Pt missed scheduled 30 minutes of skilled physical therapy due to fatigue. Pt left with all needs in reach. Will continue per current POC.   Therapy Documentation Precautions:  Precautions Precautions: Fall Precaution Comments: LLE hemiplegia Restrictions Weight Bearing Restrictions: No General: PT Amount of Missed Time (min): 30 Minutes PT Missed Treatment Reason: Patient fatigue  See FIM for current functional status  Therapy/Group: Individual Therapy  Gilmore Laroche 04/13/2014, 3:30 PM

## 2014-04-13 NOTE — Progress Notes (Signed)
Recreational Therapy Session Note  Patient Details  Name: Ivan Stark MRN: [id number] Date of Birth: January 05, 1946 Today's Date: 04/13/2014   Order received.  Planned to attempt eval completion today, but pt missed previous therapy session due to fatigue.  Will attempt eval early next week. Cumi Sanagustin 04/13/2014, 4:28 PM

## 2014-04-13 NOTE — Progress Notes (Signed)
Occupational Therapy Note  Patient Details  Name: Ivan Stark MRN: [id number] Date of Birth: [birth year]  Today's Date: 04/13/2014 OT Individual Time: 1130-1200 OT Individual Time Calculation (min): 30 min   Pt denied pain Individual Therapy  Pt resting in w/c upon arrival.  Pt agreeable to therapy and propelled to ADL apartment to practice w/c<>bed transfers and w/c<>tub bench transfers.  Pt's wife to bring in measurements of garden tub and bathroom door width. Pt performs squat/scoot transfer with steady A.  Pt practiced X 3 and propelled back to room.  Focus on activity tolerance, w/c mobility, tub bench transfers, bed transfers, and safety awareness.  Pt continues to require min verbal cues for safety awareness.   Leotis Shames [facility] 04/13/2014, 12:13 PM

## 2014-04-13 NOTE — Progress Notes (Signed)
Patient information reviewed and entered into [facility] by Becky Alicia Seib, covering PPS coordinator. Information including medical coding and functional independence measure will be reviewed and updated through discharge.  Per nursing, patient was given "Data Collection Information Summary for Patients in Inpatient Rehabilitation Facilities with attached Privacy Act Statement Health Care Records" upon admission.     

## 2014-04-13 NOTE — IPOC Note (Addendum)
Overall Plan of Care [facility]) Patient Details Name: Ivan Stark MRN: [id number] DOB: [birth year]  Admitting Diagnosis: tumor resection  Crani  Hospital Problems: Active Problems:   Metastatic cancer to brain   Cerebral meningioma   Left flaccid hemiparesis     Functional Problem List: Nursing Bladder, Bowel, Safety, Pain  PT Balance, Behavior, Endurance, Motor, Nutrition, Perception, Safety, Skin Integrity, Sensory  OT Balance, Endurance, Safety, Skin Integrity  SLP    TR  Activity tolerance, functional mobility, balance, safety, pain, anxiety/stress        Basic ADL's: OT Grooming, Bathing, Dressing, Toileting     Advanced  ADL's: OT Simple Meal Preparation, Light Housekeeping     Transfers: PT Bed Mobility, Bed to Chair, Car, Manufacturing systems engineer, Metallurgist: PT Ambulation, Emergency planning/management officer, Stairs     Additional Impairments: OT    SLP        TR      Anticipated Outcomes Item Anticipated Outcome  Self Feeding    Set designer Transfers Supervision  Bowel/Bladder  Continent to bladder and bowel  Transfers  supervision  Locomotion  supervision-min A  Communication     Cognition     Pain  Pain levels less than 3 on scale 1 to 10  Safety/Judgment  Pt. will be free from falls during his stay in rehab   Therapy Plan: PT Intensity: Minimum of 1-2 x/day ,45 to 90 minutes PT Frequency: 5 out of 7 days PT Duration Estimated Length of Stay: 14 to 16 days OT Intensity: Minimum of 1-2 x/day, 45 to 90 minutes OT Frequency: 5 out of 7 days OT Duration/Estimated Length of Stay: 2 weeks  TR Duration/ELOS:  10 Days TR Frequency:  Min 1 time per week >20 minutes           Team Interventions: Nursing Interventions Patient/Family Education, Bladder Management, Bowel Management, Medication Management, Pain Management, Disease Management/Prevention, Skin Care/Wound  Management  PT interventions Ambulation/gait training, Balance/vestibular training, Discharge planning, DME/adaptive equipment instruction, Functional mobility training, Neuromuscular re-education, Functional electrical stimulation, Patient/family education, Psychosocial support, Stair training, Splinting/orthotics, Therapeutic Activities, Therapeutic Exercise, UE/LE Strength taining/ROM, UE/LE Coordination activities, Wheelchair propulsion/positioning  OT Interventions Training and development officer, Academic librarian, Engineer, drilling, Discharge planning, Functional mobility training, Neuromuscular re-education, Psychosocial support, Patient/family education, Self Care/advanced ADL retraining, Skin care/wound managment, Therapeutic Activities, UE/LE Strength taining/ROM, Wheelchair propulsion/positioning  SLP Interventions    TR Interventions Recreation/leisure participation, Balance/Vestibular training, functional mobility, therapeutic activities, UE/LE strength/coordination, w/c mobility, community reintegration, pt/family education, adaptive equipment instruction/use, discharge planning, psychosocial support  SW/CM Interventions Discharge Planning, Barrister's clerk, Patient/Family Education    Team Discharge Planning: Destination: PT-Home ,OT- Home , SLP-  Projected Follow-up: PT-Home health PT, OT-  Home health OT, 24 hour supervision/assistance, SLP-  Projected Equipment Needs: PT-To be determined, OT- To be determined, SLP-  Equipment Details: PT- , OT-anticipate need for 3 in 1 and tub bench Patient/family involved in discharge planning: PT- Patient,  OT-Patient, SLP-   MD ELOS: 14-15 days Medical Rehab Prognosis:  Good Assessment: The patient has been admitted for CIR therapies with the diagnosis of metastatic cancer to the brain s/p resection---pt with subsequent left hemiparesis. The team will be addressing functional mobility, strength, stamina, balance, safety,  adaptive techniques and equipment, self-care, bowel and bladder mgt, patient and caregiver education, NMR, coping skills, pain mgt, visual perceptual awareness, orthotics, ego support. Goals  have been set at supervision for mobility and self-care, perhaps sup to min assist with locomotion.    Meredith Staggers, MD, FAAPMR      See Team Conference Notes for weekly updates to the plan of care

## 2014-04-13 NOTE — Progress Notes (Signed)
Occupational Therapy Session Note  Patient Details  Name: Ivan Stark MRN: [id number] Date of Birth: 1945-12-18  Today's Date: 04/13/2014 OT Individual Time: 0800-0900 OT Individual Time Calculation (min): 60 min    Short Term Goals: Week 1:  OT Short Term Goal 1 (Week 1): Bath: performed in sit and stand with steady assist and use of LH sponge PRN. OT Short Term Goal 2 (Week 1): LB dressing: min assist to include sit and stand and AE PRN OT Short Term Goal 3 (Week 1): Toilet transfer: Min assist  OT Short Term Goal 4 (Week 1): Toileting: Min assist OT Short Term Goal 5 (Week 1): Simple kitchen tasks: Supervision at a w/c level  Skilled Therapeutic Interventions/Progress Updates:    Pt engaged in bathing at shower level and dressing with sit<>stand from chair.  Pt requires to use toilet prior to shower.  Pt required mod/max A for squat/pivot transfer to [facility] over toilet.  Pt required mod A for transfer to tub bench in shower.  Pt stood at sink to pull up pants with mod A.  Pt requires manual facilitation at Lt knee for support when standing.  Pt continues to exhibit some impulsivity and requires min verbal cues for safety awareness.  Focus on activity tolerance, safety awareness, transfers, sit<>stand, standing balance, and w/c mobility.  Therapy Documentation Precautions:  Precautions Precautions: Fall Precaution Comments: LLE hemiplegia Restrictions Weight Bearing Restrictions: No   Pain: Pain Assessment Pain Assessment: No/denies pain  See FIM for current functional status  Therapy/Group: Individual Therapy  Leroy Libman 04/13/2014, 9:05 AM

## 2014-04-13 NOTE — Progress Notes (Signed)
Patient had dye study yesterday. Will need to hold metformin for 48 hours--orders adjusted and added novolog meal coverage for next 2 days. Will check lytes in am for follow up on renal function.

## 2014-04-13 NOTE — Consult Note (Signed)
Urology Consult   Physician requesting consult: Benay Spice  Reason for consult: Prostate mass  History of Present Illness: Ivan Stark is a 68 y.o. male with PMH significant for DM, HTN, hyperlipidemia, and metastatic small cell lung cancer.  He is s/p craniotomy with resection of left frontal meningioma 04/06/14.  He is still working with inpatient rehab.  Staging CTs of C/A/P revealed a markedly enlarged and necrotic appearing prostate with adjacent adenopathy and associated mass effect on the bladder and rectum. Dr. 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    Pt is currently in rehab.  He states that he feels well today.  He denies HA, CP, SOB, and N/V.  He denies any current issues with voiding.  Per pt report, his PCP Dr. Vicente Serene has performed regular prostate exams with yearly PSAs.  The pt does not recall his PSA value but states that he was never told it was abnormal.    He denies a history of voiding or storage urinary symptoms, hematuria, UTIs, STDs, urolithiasis, GU malignancy/trauma/surgery.  Past Medical History  Diagnosis Date  . Diabetes mellitus without complication   . Hypertension   . Hyperlipidemia   . GERD (gastroesophageal reflux disease)   . Vitamin D deficiency   . History of colon polyps   . Complication of anesthesia     Difficult to wake up; and severe nausea and vomiting  . PONV (postoperative nausea and vomiting)     Past Surgical History  Procedure Laterality Date  . Tonsillectomy    . Colonoscopy w/ polypectomy    . Esophagus surgery      Had esophagus stretched due to food getting trapped in his throat  . Craniotomy Bilateral 04/06/2014    Procedure: Bilateral Fronto-parietal craniotomy for resection of tumors with Curve;  Surgeon: Consuella Lose, MD;  Location: [place] [facility];  Service: Neurosurgery;  Laterality: Bilateral;  Bilateral Fronto-parietal craniotomy for resection of tumors with Curve    Current  Hospital Medications:  Home Meds:    Medication List    ASK your doctor about these medications        aspirin EC 81 MG tablet  Take 81 mg by mouth daily.     dexamethasone 4 MG tablet  Commonly known as:  DECADRON  Take 4 mg by mouth 2 (two) times daily.     enalapril 20 MG tablet  Commonly known as:  VASOTEC  Take 20 mg by mouth daily.     glimepiride 4 MG tablet  Commonly known as:  AMARYL  Take 2 mg by mouth 2 (two) times daily.     glimepiride 4 MG tablet  Commonly known as:  AMARYL  TAKE ONE-HALF TO ONE TABLET BY MOUTH TWICE DAILY WITH FOOD FOR DIABETES     metFORMIN 500 MG 24 hr tablet  Commonly known as:  GLUCOPHAGE-XR  Take 500 mg by mouth 3 (three) times daily. 500mg  in a.m., 500mg . At lunch, 1000mg . In the evening     multivitamin with minerals Tabs tablet  Take 1 tablet by mouth daily.     pravastatin 40 MG tablet  Commonly known as:  PRAVACHOL  Take 1 tablet (40 mg total) by mouth at bedtime.     vitamin C 1000 MG tablet  Take 1,000 mg by mouth daily.     Vitamin D 2000 UNITS Caps  Take 4,000 Units by mouth daily.        Scheduled Meds: . calcium-vitamin D  1 tablet Oral BID  . dexamethasone  4 mg Oral Q12H  . enalapril  20 mg Oral Daily  . enoxaparin (LOVENOX) injection  40 mg Subcutaneous Q24H  . feeding supplement (PRO-STAT SUGAR FREE 64)  30 mL Oral TID WC  . glimepiride  2 mg Oral BID WC  . insulin aspart  0-20 Units Subcutaneous TID WC  . insulin aspart  3 Units Subcutaneous TID WC  . insulin detemir  15 Units Subcutaneous QHS  . levETIRAcetam  500 mg Oral BID  . [START ON 04/15/2014] metFORMIN  1,000 mg Oral Q supper  . [START ON 04/15/2014] metFORMIN  500 mg Oral BID WC  . multivitamin with minerals  1 tablet Oral Daily  . pantoprazole  40 mg Oral QHS  . pravastatin  40 mg Oral q1800  . senna  2 tablet Oral BID   Continuous Infusions:  PRN Meds:.acetaminophen, ALPRAZolam, alum & mag hydroxide-simeth, bisacodyl, diphenhydrAMINE,  guaiFENesin-dextromethorphan, HYDROcodone-acetaminophen, ondansetron **OR** ondansetron (ZOFRAN) IV, simethicone, sorbitol, traZODone  Allergies:  Allergies  Allergen Reactions  . Atenolol Shortness Of Breath  . Lipitor [Atorvastatin]     Itch    Family History  Problem Relation Age of Onset  . Goiter Mother   . Hypertension Father   . Cancer Father     colon    Social History:  reports that he has never smoked. He does not have any smokeless tobacco history on file. He reports that he does not drink alcohol or use illicit drugs.  ROS: A complete review of systems was performed.  All systems are negative except for pertinent findings as noted.  Physical Exam:  Vital signs in last 24 hours: Temp:  [98.1 F (36.7 C)-98.3 F (36.8 C)] 98.1 F (36.7 C) (11/06 0500) Pulse Rate:  [84-90] 84 (11/06 0500) Resp:  [18] 18 (11/06 0500) BP: (120-125)/(67-71) 120/67 mmHg (11/06 0500) SpO2:  [95 %-98 %] 98 % (11/06 0500) Constitutional:  Alert and oriented, No acute distress Cardiovascular: Regular rate and rhythm Respiratory: Normal respiratory effort GI: Abdomen is soft, nontender, nondistended, no abdominal masses GU:  Lymphatic: No lymphadenopathy Neurologic: Grossly intact, no focal deficits Psychiatric: Normal mood and affect  Laboratory Data:   Recent Labs  04/12/14 0430  WBC 10.3  HGB 11.4*  HCT 34.3*  PLT 306     Recent Labs  04/12/14 0430  NA 140  K 4.5  CL 102  GLUCOSE 136*  BUN 20  CALCIUM 8.9  CREATININE 0.82     Results for orders placed or performed during the hospital encounter of 04/11/14 (from the past 24 hour(s))  Glucose, capillary     Status: None   Collection Time: 04/12/14 12:01 PM  Result Value Ref Range   Glucose-Capillary 91 70 - 99 mg/dL  Glucose, capillary     Status: Abnormal   Collection Time: 04/12/14  4:43 PM  Result Value Ref Range   Glucose-Capillary 130 (H) 70 - 99 mg/dL  Glucose, capillary     Status: Abnormal    Collection Time: 04/12/14  8:37 PM  Result Value Ref Range   Glucose-Capillary 152 (H) 70 - 99 mg/dL   Recent Results (from the past 240 hour(s))  MRSA PCR Screening     Status: None   Collection Time: 04/06/14  3:23 PM  Result Value Ref Range Status   MRSA by PCR NEGATIVE NEGATIVE Final    Comment:        The GeneXpert MRSA Assay (FDA approved for NASAL specimens  only), is one component of a comprehensive MRSA colonization surveillance program. It is not intended to diagnose MRSA infection nor to guide or monitor treatment for MRSA infections.    Renal Function:  Recent Labs  04/06/14 1610 04/07/14 0625 04/12/14 0430  CREATININE 0.86 0.84 0.82   Estimated Creatinine Clearance: 108.8 mL/min (by C-G formula based on Cr of 0.82).  Radiologic Imaging: Ct Chest W Contrast  04/13/2014   CLINICAL DATA:  Small cell carcinoma, 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  EXAM: CT CHEST, ABDOMEN, AND PELVIS WITH CONTRAST  TECHNIQUE: Multidetector CT imaging of the chest, abdomen and pelvis was performed following the standard protocol during bolus administration of intravenous contrast.  CONTRAST:  165mL OMNIPAQUE IOHEXOL 300 MG/ML  SOLN  COMPARISON:  None.  FINDINGS: CT CHEST FINDINGS  Left lobe of the thyroid is asymmetrically enlarged and contains low-attenuation nodules measuring up to 1.6 cm. No pathologically enlarged mediastinal, hilar or axillary lymph nodes. Atherosclerotic calcification of the arterial vasculature, including three-vessel involvement of the coronary arteries. Heart size normal. No pericardial effusion. Small hiatal hernia also contains fluid.  Minimal biapical pleural parenchymal scarring. Probable subpleural atelectasis or scarring along the superior aspect of the right major fissure (image 22). Lungs are otherwise clear. No pleural fluid. Airway is unremarkable.  CT ABDOMEN AND PELVIS FINDINGS  Hepatobiliary: Liver and gallbladder are unremarkable. No biliary ductal  dilatation.  Pancreas: Negative.  Spleen: Negative.  Adrenals/Urinary Tract: Adrenal glands and kidneys are unremarkable. Ureters are decompressed. Prostate indents the bladder base.  Stomach/Bowel: Small hiatal hernia. Stomach, small bowel and appendix are unremarkable. A polypoid soft tissue lesion is seen in the cecum, measuring approximately 1.7 x 2.4 cm (axial image 97 and coronal image 82). Enlarged prostate exerts mass effect on the rectum. Colon is otherwise unremarkable.  Vascular/Lymphatic: Atherosclerotic calcification of the arterial vasculature without abdominal aortic aneurysm. Circumaortic left renal vein. Ileocolic mesenteric lymph node measures 1.2 x 2.1 cm (axial image 96). Lymph nodes are seen adjacent to the prostate, along the superior margin at the 7 o'clock position, measuring up to 11 mm in short axis (axial image 127). No additional pathologically enlarged lymph nodes.  Reproductive: Prostate is markedly enlarged and low in attenuation centrally, measuring approximately 7.5 x 8.1 cm.  Other: A heterogeneous subcutaneous nodule is seen in the inferior left flank, measuring 1.2 x 1.9 cm (axial image 87). Small bilateral inguinal hernias contain fat, left greater than right. Locules of air in the subcutaneous ventral abdominal wall are most likely iatrogenic. Mesenteries and peritoneum are otherwise unremarkable.  Musculoskeletal: A vague area of sclerosis in the left iliac wing (axial image 113) is nonspecific. Otherwise, no worrisome lytic or sclerotic lesions.  IMPRESSION: 1. No evidence of primary bronchogenic carcinoma. 2. Polypoid lesion in the cecum, with a pathologically enlarged ileocolic mesenteric lymph node, worrisome for primary colon carcinoma. 3. Markedly enlarged and necrotic appearing prostate with adjacent adenopathy. Associated mass effect on the bladder and rectum. 4. Heterogeneous subcutaneous nodule in the inferior left flank, likely palpable on physical examination.  Appearance is not consistent with a sebaceous cyst. 5. Asymmetrically enlarged left thyroid with low-attenuation lesions. Consider further evaluation with thyroid ultrasound. If patient is clinically hyperthyroid, consider nuclear medicine thyroid uptake and scan. 6. Small hiatal hernia contains a small amount of fluid. 7. Three-vessel coronary artery calcification. 8. Small bilateral inguinal hernias contain fat.   Electronically Signed   By: Lorin Picket M.D.   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  EXAM: CT CHEST, ABDOMEN, AND PELVIS WITH CONTRAST  TECHNIQUE: Multidetector CT imaging of the chest, abdomen and pelvis was performed following the standard protocol during bolus administration of intravenous contrast.  CONTRAST:  158mL OMNIPAQUE IOHEXOL 300 MG/ML  SOLN  COMPARISON:  None.  FINDINGS: CT CHEST FINDINGS  Left lobe of the thyroid is asymmetrically enlarged and contains low-attenuation nodules measuring up to 1.6 cm. No pathologically enlarged mediastinal, hilar or axillary lymph nodes. Atherosclerotic calcification of the arterial vasculature, including three-vessel involvement of the coronary arteries. Heart size normal. No pericardial effusion. Small hiatal hernia also contains fluid.  Minimal biapical pleural parenchymal scarring. Probable subpleural atelectasis or scarring along the superior aspect of the right major fissure (image 22). Lungs are otherwise clear. No pleural fluid. Airway is unremarkable.  CT ABDOMEN AND PELVIS FINDINGS  Hepatobiliary: Liver and gallbladder are unremarkable. No biliary ductal dilatation.  Pancreas: Negative.  Spleen: Negative.  Adrenals/Urinary Tract: Adrenal glands and kidneys are unremarkable. Ureters are decompressed. Prostate indents the bladder base.  Stomach/Bowel: Small hiatal hernia. Stomach, small bowel and appendix are unremarkable. A polypoid soft tissue  lesion is seen in the cecum, measuring approximately 1.7 x 2.4 cm (axial image 97 and coronal image 82). Enlarged prostate exerts mass effect on the rectum. Colon is otherwise unremarkable.  Vascular/Lymphatic: Atherosclerotic calcification of the arterial vasculature without abdominal aortic aneurysm. Circumaortic left renal vein. Ileocolic mesenteric lymph node measures 1.2 x 2.1 cm (axial image 96). Lymph nodes are seen adjacent to the prostate, along the superior margin at the 7 o'clock position, measuring up to 11 mm in short axis (axial image 127). No additional pathologically enlarged lymph nodes.  Reproductive: Prostate is markedly enlarged and low in attenuation centrally, measuring approximately 7.5 x 8.1 cm.  Other: A heterogeneous subcutaneous nodule is seen in the inferior left flank, measuring 1.2 x 1.9 cm (axial image 87). Small bilateral inguinal hernias contain fat, left greater than right. Locules of air in the subcutaneous ventral abdominal wall are most likely iatrogenic. Mesenteries and peritoneum are otherwise unremarkable.  Musculoskeletal: A vague area of sclerosis in the left iliac wing (axial image 113) is nonspecific. Otherwise, no worrisome lytic or sclerotic lesions.  IMPRESSION: 1. No evidence of primary bronchogenic carcinoma. 2. Polypoid lesion in the cecum, with a pathologically enlarged ileocolic mesenteric lymph node, worrisome for primary colon carcinoma. 3. Markedly enlarged and necrotic appearing prostate with adjacent adenopathy. Associated mass effect on the bladder and rectum. 4. Heterogeneous subcutaneous nodule in the inferior left flank, likely palpable on physical examination. Appearance is not consistent with a sebaceous cyst. 5. Asymmetrically enlarged left thyroid with low-attenuation lesions. Consider further evaluation with thyroid ultrasound. If patient is clinically hyperthyroid, consider nuclear medicine thyroid uptake and scan. 6. Small hiatal hernia contains a  small amount of fluid. 7. Three-vessel coronary artery calcification. 8. Small bilateral inguinal hernias contain fat.   Electronically Signed   By: Lorin Picket M.D.   On: 04/13/2014 08:30     Impression/Recommendation Enlarged necrotic appearing prostate with adjacent adenopathy in pt with metastatic small cell cancer and s/p craniotomy for resection of tumor--Dr. Gaynelle Arabian will perform prostate exam and discuss possible prostate biopsy with pt.  PSA is currently pending.   Tane Biegler 04/13/2014, 10:34 AM

## 2014-04-13 NOTE — Progress Notes (Signed)
IP PROGRESS NOTE  Subjective:   He appears stable. He reports urinary frequency, no other urologic complaint. No pelvic pain.  Objective: Vital signs in last 24 hours: Blood pressure 120/67, pulse 84, temperature 98.1 F (36.7 C), temperature source Oral, resp. rate 18, height 6\' 4"  (1.93 m), weight 201 lb 12.8 oz (91.536 kg), SpO2 98 %.  Intake/Output from previous day: 11/05 0701 - 11/06 0700 In: 600 [P.O.:600] Out: 3050 [Urine:3050]  Physical Exam: Not performed today  Lab Results:  Recent Labs  04/12/14 0430  WBC 10.3  HGB 11.4*  HCT 34.3*  PLT 306    BMET  Recent Labs  04/12/14 0430  NA 140  K 4.5  CL 102  CO2 24  GLUCOSE 136*  BUN 20  CREATININE 0.82  CALCIUM 8.9    Studies/Results: No results found.  Medications: I have reviewed the patient's current medications.  Assessment/Plan: 1. 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 Left frontal meningioma, WHO grade 1, status post resection 04/06/2014  3. Left leg weakness secondary to #1  4. 1 cm  brain lesion at the inferior falx noted on the MRI 03/21/2014, not resected  5.  Diabetes  6.  Hypertension  7.  Hyperlipidemia   [name] has a markedly enlarged prostate on the staging CT. I see no other clear evidence of a primary tumor site or metastases, the final CT report is pending. 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 I discussed the CT findings with Mr. Dobosz. I will coordinate radiation and systemic therapy with Dr. Tammi Klippel if he is confirmed to have small cell carcinoma of the prostate.  Recommendations: 1. Check PSA 2. Urology consult for a prostate biopsy- I will call 3. Please call oncology as needed. I will check on him 04/16/2014.    LOS: 2 days   Oma Alpert  04/13/2014, 8:19 AM

## 2014-04-13 NOTE — Plan of Care (Signed)
Problem: RH BOWEL ELIMINATION Goal: RH STG MANAGE BOWEL WITH ASSISTANCE STG Manage Bowel with min Assistance.  Outcome: Progressing Goal: RH STG MANAGE BOWEL W/MEDICATION W/ASSISTANCE STG Manage Bowel with Medication with min. Assistance.  Outcome: Progressing  Problem: RH SKIN INTEGRITY Goal: RH STG SKIN FREE OF INFECTION/BREAKDOWN With min. assisst  Outcome: Progressing Goal: RH STG MAINTAIN SKIN INTEGRITY WITH ASSISTANCE STG Maintain Skin Integrity With min.Assistance.  Outcome: Progressing Goal: RH OTHER STG SKIN INTEGRITY GOALS W/ASSIST Other STG Skin Integrity Goals With Assistance.  Outcome: Progressing  Problem: RH SAFETY Goal: RH STG ADHERE TO SAFETY PRECAUTIONS W/ASSISTANCE/DEVICE STG Adhere to Safety Precautions With mod. Assistance/Device.  Outcome: Progressing Goal: RH STG DECREASED RISK OF FALL WITH ASSISTANCE STG Decreased Risk of Fall With Assistance.  Outcome: Progressing

## 2014-04-13 NOTE — Progress Notes (Signed)
Physical Therapy Session Note  Patient Details  Name: [name] MRN: [id number] Date of Birth: 06-Feb-1946  Today's Date: 04/13/2014 PT Individual Time: 1000-1100 PT Individual Time Calculation (min): 60 min   Short Term Goals: Week 1:  PT Short Term Goal 1 (Week 1): Pt will perform bed mobility with min A.  PT Short Term Goal 2 (Week 1): Pt will transfer bed <> w/c wtih min A.  PT Short Term Goal 3 (Week 1): Pt will ambulate 25 ft with mod A.  PT Short Term Goal 4 (Week 1): Pt will negotiate 3 stairs with 2 rails and mod A.   Skilled Therapeutic Interventions/Progress Updates:  1:1. Pt received sitting in w/c, ready for therapy. Focus this session on functional endurance, functional w/c propulsion and NMR in standing. Pt reported feeling very tired today, education provided regarding energy conservation in relation to recovery process. Pt req supervision for w/c propulsion 175'x2 with B UE and supervision. Min A for completion of lateral scoot/squat pivot t/f w/c<>tx mat with emphasis on management of w/c parts of set up.   Emphasis on multiple t/f sit<>stand with emphasis on seq, safety and even WB through B LE, with min A overall. Pt very anxious during initial t/f sit<>stand with improved comfort and quality with repetition. In standing with L UE support on RW, pt practiced reaching across midline with R UE to grasp horseshoes on rim of basketball hoop then placing onto table on R side to target balance, B weight shifting, timing and coordination of quad control in L LE with manual facilitation. Pt practiced placing L LE on/off step to target hip flexion as well as placing R LE on/off step to target quad control in L LE for stability, initially req mod A and decreasing to min A with repetition. During standing tasks, pt utilized L posterior AFO for improved stability and DF assist.   Pt left sitting in w/c at end of session w/ all needs in reach.   Therapy Documentation Precautions:   Precautions Precautions: Fall Precaution Comments: LLE hemiplegia Restrictions Weight Bearing Restrictions: No   Pain: Pain Assessment Pain Assessment: No/denies pain  See FIM for current functional status  Therapy/Group: Individual Therapy  Gilmore Laroche 04/13/2014, 12:43 PM

## 2014-04-14 ENCOUNTER — Inpatient Hospital Stay (HOSPITAL_COMMUNITY): Payer: Medicare Other

## 2014-04-14 ENCOUNTER — Inpatient Hospital Stay (HOSPITAL_COMMUNITY): Payer: Medicare Other | Admitting: *Deleted

## 2014-04-14 LAB — BASIC METABOLIC PANEL
ANION GAP: 12 (ref 5–15)
BUN: 25 mg/dL — ABNORMAL HIGH (ref 6–23)
CHLORIDE: 101 meq/L (ref 96–112)
CO2: 26 mEq/L (ref 19–32)
CREATININE: 0.79 mg/dL (ref 0.50–1.35)
Calcium: 9.1 mg/dL (ref 8.4–10.5)
GFR calc Af Amer: >90 mL/min (ref >90)
GFR calc non Af Amer: >90 mL/min (ref >90)
Glucose, Bld: 146 mg/dL — ABNORMAL HIGH (ref 70–99)
Potassium: 4.2 mEq/L (ref 3.7–5.3)
SODIUM: 139 meq/L (ref 137–147)

## 2014-04-14 LAB — PSA: PSA: 4.21 ng/mL — ABNORMAL HIGH (ref <=4.00)

## 2014-04-14 LAB — GLUCOSE, CAPILLARY
GLUCOSE-CAPILLARY: 219 mg/dL — AB (ref 70–99)
GLUCOSE-CAPILLARY: 136 mg/dL — AB (ref 70–99)
Glucose-Capillary: 102 mg/dL — ABNORMAL HIGH (ref 70–99)
Glucose-Capillary: 118 mg/dL — ABNORMAL HIGH (ref 70–99)

## 2014-04-14 NOTE — Progress Notes (Signed)
Occupational Therapy Session Note  Patient Details  Name: Ivan Stark MRN: [id number] Date of Birth: 09-29-1945  Today's Date: 04/14/2014 OT Individual Time:  - 1030-1130   (60 min)      Short Term Goals: Week 1:  OT Short Term Goal 1 (Week 1): Bath: performed in sit and stand with steady assist and use of LH sponge PRN. OT Short Term Goal 2 (Week 1): LB dressing: min assist to include sit and stand and AE PRN OT Short Term Goal 3 (Week 1): Toilet transfer: Min assist  OT Short Term Goal 4 (Week 1): Toileting: Min assist OT Short Term Goal 5 (Week 1): Simple kitchen tasks: Supervision at a w/c level Week 2:     Skilled Therapeutic Interventions/Progress Updates:    Pt. Sitting in wc upon OT arrival.  Pt Stated he had a  shadowin his upper left quadrant  eye.  but he was able to see through it..  He has not experienced this before.   His peripheral and occular AROM  is intact.  Called MD who examined.Shadow image lasted for 10 minutes and then faded away. Engaged in light exercises using UE AROM. Using 2# weights.  Pt reported the shadow vent made his brain tired. He had been talking on the phone when the event started.Marland Kitchen  He felt his physical aspect of his body was fine ,but his brain was tired from talking.  Pt reported it was somewhat harder to think of words as quickly as normal.  Pt. Returned to supine with lights off and needs in reach  Therapy Documentation Precautions:  Precautions Precautions: Fall Precaution Comments: LLE hemiplegia Restrictions Weight Bearing Restrictions: No   Pain: Pain Assessment Pain Assessment: No/denies pain     See FIM for current functional status  Therapy/Group: Individual Therapy  Lisa Roca 04/14/2014, 8:03 AM

## 2014-04-14 NOTE — Progress Notes (Addendum)
Ivan Stark is a 68 y.o. male Mar 27, 1946 [id number]  Subjective:  Slept well. Feeling OK.  On 11/7 the pt had a "partial visual field darkening" out of his L eye x 10 min - nl exam at the time..    Objective: Vital signs in last 24 hours: Temp:  [97.6 F (36.4 C)-97.9 F (36.6 C)] 97.6 F (36.4 C) (11/07 0600) Pulse Rate:  [64-82] 64 (11/07 0600) Resp:  [18] 18 (11/07 0600) BP: (104-114)/(60-71) 114/71 mmHg (11/07 0600) SpO2:  [95 %-98 %] 98 % (11/07 0600) Weight change:  Last BM Date: 04/13/14  Intake/Output from previous day: 11/06 0701 - 11/07 0700 In: 240 [P.O.:240] Out: 2120 [Urine:2120] Last cbgs: CBG (last 3)   Recent Labs  04/13/14 1650 04/13/14 2042 04/14/14 0718  GLUCAP 159* 143* 102*     Physical Exam General: No apparent distress   HEENT: not dry Lungs: Normal effort. Lungs clear to auscultation, no crackles or wheezes. Cardiovascular: Regular rate and rhythm, no edema Abdomen: S/NT/ND; BS(+) Musculoskeletal:  unchanged Neurological: No new neurological deficits Wounds: healing    Skin: clear  Aging changes Mental state: Alert, oriented, cooperative    Lab Results: BMET    Component Value Date/Time   NA 139 04/14/2014 0405   K 4.2 04/14/2014 0405   CL 101 04/14/2014 0405   CO2 26 04/14/2014 0405   GLUCOSE 146* 04/14/2014 0405   BUN 25* 04/14/2014 0405   CREATININE 0.79 04/14/2014 0405   CREATININE 0.94 02/05/2014 1305   CALCIUM 9.1 04/14/2014 0405   GFRNONAA >90 04/14/2014 0405   GFRNONAA 84 02/05/2014 1305   GFRAA >90 04/14/2014 0405   GFRAA >89 02/05/2014 1305   CBC    Component Value Date/Time   WBC 10.3 04/12/2014 0430   RBC 3.84* 04/12/2014 0430   HGB 11.4* 04/12/2014 0430   HCT 34.3* 04/12/2014 0430   PLT 306 04/12/2014 0430   MCV 89.3 04/12/2014 0430   MCH 29.7 04/12/2014 0430   MCHC 33.2 04/12/2014 0430   RDW 13.3 04/12/2014 0430   LYMPHSABS 1.5 04/12/2014 0430   MONOABS 0.9 04/12/2014 0430   EOSABS 0.0  04/12/2014 0430   BASOSABS 0.0 04/12/2014 0430    Studies/Results: Ct Chest W Contrast  04/13/2014   CLINICAL DATA:  Small cell carcinoma, 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  EXAM: CT CHEST, ABDOMEN, AND PELVIS WITH CONTRAST  TECHNIQUE: Multidetector CT imaging of the chest, abdomen and pelvis was performed following the standard protocol during bolus administration of intravenous contrast.  CONTRAST:  174mL OMNIPAQUE IOHEXOL 300 MG/ML  SOLN  COMPARISON:  None.  FINDINGS: CT CHEST FINDINGS  Left lobe of the thyroid is asymmetrically enlarged and contains low-attenuation nodules measuring up to 1.6 cm. No pathologically enlarged mediastinal, hilar or axillary lymph nodes. Atherosclerotic calcification of the arterial vasculature, including three-vessel involvement of the coronary arteries. Heart size normal. No pericardial effusion. Small hiatal hernia also contains fluid.  Minimal biapical pleural parenchymal scarring. Probable subpleural atelectasis or scarring along the superior aspect of the right major fissure (image 22). Lungs are otherwise clear. No pleural fluid. Airway is unremarkable.  CT ABDOMEN AND PELVIS FINDINGS  Hepatobiliary: Liver and gallbladder are unremarkable. No biliary ductal dilatation.  Pancreas: Negative.  Spleen: Negative.  Adrenals/Urinary Tract: Adrenal glands and kidneys are unremarkable. Ureters are decompressed. Prostate indents the bladder base.  Stomach/Bowel: Small hiatal hernia. Stomach, small bowel and appendix are unremarkable. A polypoid soft tissue lesion is seen in the cecum, measuring approximately 1.7 x 2.4  cm (axial image 97 and coronal image 82). Enlarged prostate exerts mass effect on the rectum. Colon is otherwise unremarkable.  Vascular/Lymphatic: Atherosclerotic calcification of the arterial vasculature without abdominal aortic aneurysm. Circumaortic left renal vein. Ileocolic mesenteric lymph node measures 1.2 x 2.1 cm (axial image 96). Lymph nodes are seen  adjacent to the prostate, along the superior margin at the 7 o'clock position, measuring up to 11 mm in short axis (axial image 127). No additional pathologically enlarged lymph nodes.  Reproductive: Prostate is markedly enlarged and low in attenuation centrally, measuring approximately 7.5 x 8.1 cm.  Other: A heterogeneous subcutaneous nodule is seen in the inferior left flank, measuring 1.2 x 1.9 cm (axial image 87). Small bilateral inguinal hernias contain fat, left greater than right. Locules of air in the subcutaneous ventral abdominal wall are most likely iatrogenic. Mesenteries and peritoneum are otherwise unremarkable.  Musculoskeletal: A vague area of sclerosis in the left iliac wing (axial image 113) is nonspecific. Otherwise, no worrisome lytic or sclerotic lesions.  IMPRESSION: 1. No evidence of primary bronchogenic carcinoma. 2. Polypoid lesion in the cecum, with a pathologically enlarged ileocolic mesenteric lymph node, worrisome for primary colon carcinoma. 3. Markedly enlarged and necrotic appearing prostate with adjacent adenopathy. Associated mass effect on the bladder and rectum. 4. Heterogeneous subcutaneous nodule in the inferior left flank, likely palpable on physical examination. Appearance is not consistent with a sebaceous cyst. 5. Asymmetrically enlarged left thyroid with low-attenuation lesions. Consider further evaluation with thyroid ultrasound. If patient is clinically hyperthyroid, consider nuclear medicine thyroid uptake and scan. 6. Small hiatal hernia contains a small amount of fluid. 7. Three-vessel coronary artery calcification. 8. Small bilateral inguinal hernias contain fat.   Electronically Signed   By: Lorin Picket M.D.   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  EXAM: CT CHEST, ABDOMEN, AND PELVIS WITH CONTRAST  TECHNIQUE: Multidetector CT imaging of the chest, abdomen and  pelvis was performed following the standard protocol during bolus administration of intravenous contrast.  CONTRAST:  178mL OMNIPAQUE IOHEXOL 300 MG/ML  SOLN  COMPARISON:  None.  FINDINGS: CT CHEST FINDINGS  Left lobe of the thyroid is asymmetrically enlarged and contains low-attenuation nodules measuring up to 1.6 cm. No pathologically enlarged mediastinal, hilar or axillary lymph nodes. Atherosclerotic calcification of the arterial vasculature, including three-vessel involvement of the coronary arteries. Heart size normal. No pericardial effusion. Small hiatal hernia also contains fluid.  Minimal biapical pleural parenchymal scarring. Probable subpleural atelectasis or scarring along the superior aspect of the right major fissure (image 22). Lungs are otherwise clear. No pleural fluid. Airway is unremarkable.  CT ABDOMEN AND PELVIS FINDINGS  Hepatobiliary: Liver and gallbladder are unremarkable. No biliary ductal dilatation.  Pancreas: Negative.  Spleen: Negative.  Adrenals/Urinary Tract: Adrenal glands and kidneys are unremarkable. Ureters are decompressed. Prostate indents the bladder base.  Stomach/Bowel: Small hiatal hernia. Stomach, small bowel and appendix are unremarkable. A polypoid soft tissue lesion is seen in the cecum, measuring approximately 1.7 x 2.4 cm (axial image 97 and coronal image 82). Enlarged prostate exerts mass effect on the rectum. Colon is otherwise unremarkable.  Vascular/Lymphatic: Atherosclerotic calcification of the arterial vasculature without abdominal aortic aneurysm. Circumaortic left renal vein. Ileocolic mesenteric lymph node measures 1.2 x 2.1 cm (axial image 96). Lymph nodes are seen adjacent to the prostate, along the superior margin at the 7 o'clock position, measuring up to 11 mm  in short axis (axial image 127). No additional pathologically enlarged lymph nodes.  Reproductive: Prostate is markedly enlarged and low in attenuation centrally, measuring approximately 7.5 x 8.1  cm.  Other: A heterogeneous subcutaneous nodule is seen in the inferior left flank, measuring 1.2 x 1.9 cm (axial image 87). Small bilateral inguinal hernias contain fat, left greater than right. Locules of air in the subcutaneous ventral abdominal wall are most likely iatrogenic. Mesenteries and peritoneum are otherwise unremarkable.  Musculoskeletal: A vague area of sclerosis in the left iliac wing (axial image 113) is nonspecific. Otherwise, no worrisome lytic or sclerotic lesions.  IMPRESSION: 1. No evidence of primary bronchogenic carcinoma. 2. Polypoid lesion in the cecum, with a pathologically enlarged ileocolic mesenteric lymph node, worrisome for primary colon carcinoma. 3. Markedly enlarged and necrotic appearing prostate with adjacent adenopathy. Associated mass effect on the bladder and rectum. 4. Heterogeneous subcutaneous nodule in the inferior left flank, likely palpable on physical examination. Appearance is not consistent with a sebaceous cyst. 5. Asymmetrically enlarged left thyroid with low-attenuation lesions. Consider further evaluation with thyroid ultrasound. If patient is clinically hyperthyroid, consider nuclear medicine thyroid uptake and scan. 6. Small hiatal hernia contains a small amount of fluid. 7. Three-vessel coronary artery calcification. 8. Small bilateral inguinal hernias contain fat.   Electronically Signed   By: Lorin Picket M.D.   On: 04/13/2014 08:30    Medications: I have reviewed the patient's current medications.  Assessment/Plan:   1. Functional deficits secondary to Right frontal meningioma status post resection with left hemiparesis 2. DVT Prophylaxis/Anticoagulation: Pharmaceutical: Lovenox--cleared to initiate per Dr. Kathyrn Sheriff 3. Pain Management: Will continue hydrocodone prn.  4. Mood: Reports anxiety/restlessness due to steroids. low dose xanax prn. LCSW to follow for evaluation and support.  5. Neuropsych: This patient is capable of making  decisions on his own behalf. 6. Skin/Wound Care: Routine pressure relief measures.  7. Fluids/Electrolytes/Nutrition: Monitor intake. Offer supplements as needed to maintain adequate intake.  8. DM type 2: Monitor BS ac/hs and continue Levemir for now. ON amaryl and metformin bid additionally.  9. Seizure prophylaxis: Continue Keppra bid.  10. HTN: Continue vasotec daily. Monitor BP every 8 hours and adjust as needed.   12 Constipation: moved bowels yesterday 13. Oncology: likely mass prostrate on CT--radiology reading of CT pending. ?small cell prostate cancer? -spoke with Dr. Benay Spice this morning. Will ask for urology assessment -rad/onc following -pt denies any voiding issues at present 14. On 11/7 the pt had a "partial visual field darkening" out of his L eye x 10 min - nl exam. Cont Rx; Ophth consult if needed or after his d/c. Cont Rx   Length of stay, days: 3  Walker Kehr , MD 04/14/2014, 9:56 AM

## 2014-04-14 NOTE — Plan of Care (Signed)
Problem: RH BOWEL ELIMINATION Goal: RH STG MANAGE BOWEL WITH ASSISTANCE STG Manage Bowel with min Assistance.  Outcome: Progressing Goal: RH STG MANAGE BOWEL W/MEDICATION W/ASSISTANCE STG Manage Bowel with Medication with min. Assistance.  Outcome: Progressing  Problem: RH SKIN INTEGRITY Goal: RH STG SKIN FREE OF INFECTION/BREAKDOWN With min. assisst  Outcome: Progressing Goal: RH STG MAINTAIN SKIN INTEGRITY WITH ASSISTANCE STG Maintain Skin Integrity With min.Assistance.  Outcome: Progressing Goal: RH OTHER STG SKIN INTEGRITY GOALS W/ASSIST Other STG Skin Integrity Goals With Assistance.  Outcome: Progressing  Problem: RH SAFETY Goal: RH STG ADHERE TO SAFETY PRECAUTIONS W/ASSISTANCE/DEVICE STG Adhere to Safety Precautions With mod. Assistance/Device.  Outcome: Progressing Goal: RH STG DECREASED RISK OF FALL WITH ASSISTANCE STG Decreased Risk of Fall With Assistance.  Outcome: Progressing

## 2014-04-14 NOTE — Progress Notes (Signed)
Physical Therapy Session Note  Patient Details  Name: Ivan Stark MRN: [id number] Date of Birth: [birth year]  Today's Date: 04/14/2014 PT Individual Time: Treatment Session 1: 0800-0900; Treatment Session 2: 1324-4010 PT Individual Time Calculation (min): Treatment Session 1: 60 min; Treatment Session 2: 67min  Short Term Goals: Week 1:  PT Short Term Goal 1 (Week 1): Pt will perform bed mobility with min A.  PT Short Term Goal 2 (Week 1): Pt will transfer bed <> w/c wtih min A.  PT Short Term Goal 3 (Week 1): Pt will ambulate 25 ft with mod A.  PT Short Term Goal 4 (Week 1): Pt will negotiate 3 stairs with 2 rails and mod A.   Skilled Therapeutic Interventions/Progress Updates:  Treatment Session 1:  1:1. Pt received semi-reclined in bed, ready for therapy. Focus this session on functional endurance, functional transfers and mobility as well as L LE NMR in supine/standing. Pt req min A for L LE during t/f sup>sit EOB as well as t/f sup<>sit on tx mat, min A for squat pviot t/f bed>w/c<>toilet and w/c<>tx mat as well as all t/f sit<>stand with RW. Intermittent cues for attending to positioning of L LE during transfers. Pt req mod A for clothing management with cues for technique. Use of L posterior [place] AFO for increased stability and safety.  Pt req distant supervision for w/c propulsion 175'x2 with B UE and min cues for management of parts.    In supine on tx mat, emphasis on isolated motor control and sustained muscle activation in L LE. Exercises included 2x10 reps of: bridging, heel slides (assisted), quad sets, hip abd/add with B LE in hooklying. Pt performed blocked practice of t/f sit<>stand 5x with RW to target seq, coordination, balance, equal WB through B LE with manual facilitation to L quad and min A. Pt reporting improved sensation as well as hip flexion in L LE this session, noted by PT as well.   Pt left sitting in w/c at end of session w/ all needs in reach.   Treatment  Session 2:  1:1. Pt received semi-reclined in bed, ready for therapy. Focus this session on functional endurance, functional transfers and mobility,L LE NMR in standing and gait training. Pt req min guard A for t/f sup>sit EOB and squat pivot t/f bed>w/c>tx mat. Intermittent cues for attending to positioning of L LE during transfer. Pt very emotional at start of session regarding diagnosis, emotional support provided with extensive discussion regarding personal support system (family and spiritual) as well as neuropsych services. Pt req distant supervision for w/c propulsion 175'x1 and 200'x1.   Pt practiced reaching for/rearranging targets in various patterns on table to encourage glute clearance, sustained quad activation/control and even WB through B LE in modified standing position.  Pt able to amb 25' with RW and mod A for stability and advancement/placement of L LE, +2 for w/c follow. Pt advancing L LE primarily via hip flexors with therapist manually facilitating improved foot clearance as well as knee control during stance phase due to variable knee flexion and genu recurvatum. Pt able to demonstrate reciprocal, but non-fluid gait pattern at this time. Pt currently utilizing L posterior [place] AFO, however, may benefit from anterior AFO + heel wedge for improved stability and foot clearance.  Pt left sitting in w/c at end of session w/ all needs in reach.   Therapy Documentation Precautions:  Precautions Precautions: Fall Precaution Comments: LLE hemiplegia Restrictions Weight Bearing Restrictions: No Pain: Pain Assessment Pain Assessment:  No/denies pain  See FIM for current functional status  Therapy/Group: Individual Therapy  Gilmore Laroche 04/14/2014, 9:12 AM

## 2014-04-14 NOTE — Plan of Care (Signed)
Problem: RH BOWEL ELIMINATION Goal: RH STG MANAGE BOWEL WITH ASSISTANCE STG Manage Bowel with min Assistance.  Outcome: Progressing Goal: RH STG MANAGE BOWEL W/MEDICATION W/ASSISTANCE STG Manage Bowel with Medication with min. Assistance.  Outcome: Progressing  Problem: RH SKIN INTEGRITY Goal: RH STG SKIN FREE OF INFECTION/BREAKDOWN With min. assisst  Outcome: Progressing Goal: RH STG MAINTAIN SKIN INTEGRITY WITH ASSISTANCE STG Maintain Skin Integrity With min.Assistance.  Outcome: Progressing  Problem: RH SAFETY Goal: RH STG ADHERE TO SAFETY PRECAUTIONS W/ASSISTANCE/DEVICE STG Adhere to Safety Precautions With mod. Assistance/Device.  Outcome: Progressing Goal: RH STG DECREASED RISK OF FALL WITH ASSISTANCE STG Decreased Risk of Fall With Assistance.  Outcome: Progressing

## 2014-04-14 NOTE — Progress Notes (Signed)
Patient complaining of shadow to his L eye. He said he can see the shadow to his L upper quadrant of his visual field on the left eye. Denies pain, dizziness or headache. Notified Dr. Alain Marion.

## 2014-04-15 ENCOUNTER — Inpatient Hospital Stay (HOSPITAL_COMMUNITY): Payer: Medicare Other | Admitting: *Deleted

## 2014-04-15 DIAGNOSIS — H539 Unspecified visual disturbance: Secondary | ICD-10-CM | POA: Diagnosis present

## 2014-04-15 DIAGNOSIS — C61 Malignant neoplasm of prostate: Secondary | ICD-10-CM | POA: Diagnosis present

## 2014-04-15 LAB — GLUCOSE, CAPILLARY
GLUCOSE-CAPILLARY: 163 mg/dL — AB (ref 70–99)
GLUCOSE-CAPILLARY: 51 mg/dL — AB (ref 70–99)
GLUCOSE-CAPILLARY: 135 mg/dL — AB (ref 70–99)
GLUCOSE-CAPILLARY: 178 mg/dL — AB (ref 70–99)
Glucose-Capillary: 231 mg/dL — ABNORMAL HIGH (ref 70–99)

## 2014-04-15 MED ORDER — INSULIN DETEMIR 100 UNIT/ML ~~LOC~~ SOLN
10.0000 [IU] | Freq: Every day | SUBCUTANEOUS | Status: DC
  Administered 2014-04-15 – 2014-04-21 (×6): 10 [IU] via SUBCUTANEOUS
  Filled 2014-04-15 (×7): qty 0.1

## 2014-04-15 NOTE — Progress Notes (Signed)
Hypoglycemic Event  CBG: 51  Treatment: 15 GM carbohydrate snack  Symptoms: None  Follow-up CBG: Time:1151 CBG Result:135  Possible Reasons for Event: Medication regimen: patient is on amaryl,metformin,levimir   Comments/MD notified:md dc'd amaryl and decreased levimir to  10 units at bedtime.    Ivan Stark, [employer]  Remember to initiate Hypoglycemia Order Set & complete

## 2014-04-15 NOTE — Progress Notes (Signed)
Occupational Therapy Session Note  Patient Details  Name: TONIO SEIDER MRN: [id number] Date of Birth: 1945-10-18  Today's Date: 04/15/2014 OT Individual Time: 4967-5916 OT Individual Time Calculation (min): 60 min    Short Term Goals: Week 1:  OT Short Term Goal 1 (Week 1): Bath: performed in sit and stand with steady assist and use of LH sponge PRN. OT Short Term Goal 2 (Week 1): LB dressing: min assist to include sit and stand and AE PRN OT Short Term Goal 3 (Week 1): Toilet transfer: Min assist  OT Short Term Goal 4 (Week 1): Toileting: Min assist OT Short Term Goal 5 (Week 1): Simple kitchen tasks: Supervision at a w/c level  Skilled Therapeutic Interventions/Progress Updates:    Engaged in standing balance EOB with emphasis on forced use of LLE for independence with ADL.  Pt. Stood for 2 minutes with work on LLE contraction and holding.  Did 10 reps and 5 sets.  Pt.  Tolerated session well.  Wife present and assisted as needed.  Pt. Very motivated and could feel the muscle contraction.  Left pt in bed with call bell,phone within reach.     Therapy Documentation Precautions:  Precautions Precautions: Fall Precaution Comments: LLE hemiplegia Restrictions Weight Bearing Restrictions: No      Pain: none        :    See FIM for current functional status  Therapy/Group: Individual Therapy  Lisa Roca 04/15/2014, 6:42 PM

## 2014-04-16 ENCOUNTER — Inpatient Hospital Stay (HOSPITAL_COMMUNITY): Payer: Medicare Other | Admitting: *Deleted

## 2014-04-16 ENCOUNTER — Encounter (HOSPITAL_COMMUNITY): Payer: Medicare Other

## 2014-04-16 ENCOUNTER — Inpatient Hospital Stay (HOSPITAL_COMMUNITY): Payer: Medicare Other

## 2014-04-16 DIAGNOSIS — H539 Unspecified visual disturbance: Secondary | ICD-10-CM

## 2014-04-16 DIAGNOSIS — E119 Type 2 diabetes mellitus without complications: Secondary | ICD-10-CM

## 2014-04-16 LAB — GLUCOSE, CAPILLARY
GLUCOSE-CAPILLARY: 254 mg/dL — AB (ref 70–99)
Glucose-Capillary: 156 mg/dL — ABNORMAL HIGH (ref 70–99)
Glucose-Capillary: 71 mg/dL (ref 70–99)
Glucose-Capillary: 203 mg/dL — ABNORMAL HIGH (ref 70–99)

## 2014-04-16 MED ORDER — DIAZEPAM 5 MG/ML PO CONC: 10.0000 mg | Freq: Once | ORAL | Status: DC

## 2014-04-16 MED ORDER — DIAZEPAM 5 MG PO TABS
10.0000 mg | ORAL_TABLET | Freq: Once | ORAL | Status: AC
  Administered 2014-04-16: 10 mg via ORAL
  Filled 2014-04-16: qty 2

## 2014-04-16 MED ORDER — FLEET ENEMA 7-19 GM/118ML RE ENEM
1.0000 | ENEMA | Freq: Once | RECTAL | Status: AC
  Administered 2014-04-16: 1 via RECTAL
  Filled 2014-04-16: qty 1

## 2014-04-16 NOTE — Progress Notes (Signed)
Recreational Therapy Assessment and Plan  Patient Details  Name: Ivan Stark MRN: [id number] Date of Birth: 03-21-1946 Today's Date: 04/16/2014  Rehab Potential: Good ELOS: 10 days   Assessment Clinical Impression: Problem List:  Patient Active Problem List   Diagnosis Date Noted  . Metastatic cancer to brain 04/11/2014  . Cerebral meningioma   . Left flaccid hemiparesis   . Left hemiparesis 04/09/2014  . 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 Poor compliance with F/U 02/04/2014  . 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 Encounter for long-term (current) use of other medications 08/20/2013  . Hypertension   . Hyperlipidemia   . GERD (gastroesophageal reflux disease)   . Vitamin D deficiency   . History of colon polyps     Past Medical History:  Past Medical History  Diagnosis Date  . Diabetes mellitus without complication   . Hypertension   . Hyperlipidemia   . GERD (gastroesophageal reflux disease)   . Vitamin D deficiency   . History of colon polyps   . Complication of anesthesia     Difficult to wake up; and severe nausea and vomiting  . PONV (postoperative nausea and vomiting)    Past Surgical History:  Past Surgical History  Procedure Laterality Date  . Tonsillectomy    . Colonoscopy w/ polypectomy    . Esophagus surgery      Had esophagus stretched due to food getting trapped in his throat  . Craniotomy Bilateral 04/06/2014    Procedure: Bilateral Fronto-parietal craniotomy for resection of tumors with Curve; Surgeon: Consuella Lose, MD; Location: [facility]; Service: Neurosurgery; Laterality: Bilateral; Bilateral Fronto-parietal craniotomy for resection of tumors with Curve    Assessment & Plan Clinical Impression: Ivan Stark is a 68 y.o.right handed male with history of diabetes mellitus peripheral neuropathy, hypertension. Admitted 04/06/2014  with instability of gait dating back to July drifting to the left. He also described dragging of his left leg. Denies any recent falls.MRI and imaging demonstrated multiple intracranial tumors consistent with meningioma.underwent stereotactic bilateral frontoparietal craniotomy and resection of left frontal extra-axial tumor as well as right frontal extra-axial tumor 04/06/2014 per Dr. Waynetta Sandy on Decadron protocol. Keppra added for seizure prophylaxis. He is tolerating a regular diet.Patient transferred to CIR on 04/11/2014.   Pt presents with decreased activity tolerance, decreased functional mobility, decreased balance Limiting pt's independence with leisure/community pursuits.   Leisure History/Participation Premorbid leisure interest/current participation: Medical laboratory scientific officer - Building control surveyor - Doctor, hospital - Travel (Comment) Expression Interests: Music (Comment) Other Leisure Interests: Reading Leisure Participation Style: Alone;With Family/Friends Awareness of Community Resources: Good-identify 3 post discharge leisure resources Psychosocial / Spiritual Spiritual Interests: Church;Womens'Men's Groups Stress Management: Good Social interaction - Mood/Behavior: Cooperative Engineer, drilling for Education?: Yes Strengths/Weaknesses Patient Strengths/Abilities: Willingness to participate;Active premorbidly Patient weaknesses: Physical limitations  Plan Rec Therapy Plan Is patient appropriate for Therapeutic Recreation?: Yes Rehab Potential: Good Treatment times per week: Min 1 time per week >20 mintues Estimated Length of Stay: 10 days TR Treatment/Interventions: Adaptive equipment instruction;1:1 session;Balance/vestibular training;Functional mobility training;Patient/family education;Community reintegration;Therapeutic exercise;UE/LE Coordination activities;Therapeutic activities;Recreation/leisure participation;Wheelchair  propulsion/positioning Recommendations for other services: Neuropsych  Recommendations for other services: Neuropsych  Discharge Criteria: Patient will be discharged from TR if patient refuses treatment 3 consecutive times without medical reason.  If treatment goals not met, if there is a change in medical status, if patient makes no progress towards goals or if patient is discharged from hospital.  The above assessment, treatment plan, treatment alternatives and goals were discussed  and mutually agreed upon: by patient  [place] 04/16/2014, 4:03 PM

## 2014-04-16 NOTE — Plan of Care (Signed)
Problem: RH BOWEL ELIMINATION Goal: RH STG MANAGE BOWEL WITH ASSISTANCE STG Manage Bowel with min Assistance.  Outcome: Progressing Goal: RH STG MANAGE BOWEL W/MEDICATION W/ASSISTANCE STG Manage Bowel with Medication with min. Assistance.  Outcome: Not Progressing  Problem: RH SKIN INTEGRITY Goal: RH STG SKIN FREE OF INFECTION/BREAKDOWN With min. assisst  Outcome: Progressing Goal: RH STG MAINTAIN SKIN INTEGRITY WITH ASSISTANCE STG Maintain Skin Integrity With min.Assistance.  Outcome: Progressing Goal: RH OTHER STG SKIN INTEGRITY GOALS W/ASSIST Other STG Skin Integrity Goals With Assistance.  Outcome: Progressing  Problem: RH SAFETY Goal: RH STG ADHERE TO SAFETY PRECAUTIONS W/ASSISTANCE/DEVICE STG Adhere to Safety Precautions With mod. Assistance/Device.  Outcome: Progressing Goal: RH STG DECREASED RISK OF FALL WITH ASSISTANCE STG Decreased Risk of Fall With Assistance.  Outcome: Progressing

## 2014-04-16 NOTE — Progress Notes (Signed)
Urology Progress Note  : post office trans-rectal ultrasound and biopsy of prostatic mass today.  Findings: 150cc gland. No distinct prostatic architecture defined. No seminal vesicles defined.  Rectal: large exophytic mass deflects finger, with potential rectal obstruction.    Subjective:     No acute urologic events overnight. Ambulation:   positive Flatus:    positive Bowel movement  positive. Enema this AM  Pain: complete resolution. Percocet.  Objective: gen: slight bloody discharge on pad. Resolved.   Blood pressure 106/58, pulse 84, temperature 97.8 F (36.6 C), temperature source Oral, resp. rate 19, height 6\' 4"  (1.93 m), weight 91.536 kg (201 lb 12.8 oz), SpO2 99 %.  Physical Exam:  General:  No acute distress, awake  Genitourinary:  Normal penis Foley: none. Spontaneous void. Clear urine.     I/O last 3 completed shifts: In: 1560 [P.O.:1560] Out: 2551 [Urine:2550; Stool:1]  No results for input(s): HGB, WBC, PLT in the last 72 hours.  Recent Labs     04/14/14  0405  NA  139  K  4.2  CL  101  CO2  26  BUN  25*  CREATININE  0.79  CALCIUM  9.1  GFRNONAA  >90  GFRAA  >90     No results for input(s): INR, APTT in the last 72 hours.  Invalid input(s): PT   Invalid input(s): ABG  Assessment/Plan:  Continue any current medications. Pathology pending from office . Will notify with results.

## 2014-04-16 NOTE — Progress Notes (Signed)
[facility] PHYSICAL MEDICINE & REHABILITATION     PROGRESS NOTE    Subjective/Complaints: Up in bathroom this morning. Sugars a little variable over weekend. No neuro changes overnight.  Objective: Vital Signs: Blood pressure 112/70, pulse 60, temperature 97.5 F (36.4 C), temperature source Oral, resp. rate 20, height 6\' 4"  (1.93 m), weight 91.536 kg (201 lb 12.8 oz), SpO2 99 %. No results found. No results for input(s): WBC, HGB, HCT, PLT in the last 72 hours.  Recent Labs  04/14/14 0405  NA 139  K 4.2  CL 101  GLUCOSE 146*  BUN 25*  CREATININE 0.79  CALCIUM 9.1   CBG (last 3)   Recent Labs  04/15/14 1637 04/15/14 2133 04/16/14 0546  GLUCAP 178* 231* 156*    Wt Readings from Last 3 Encounters:  04/11/14 91.536 kg (201 lb 12.8 oz)  04/06/14 94.8 kg (208 lb 15.9 oz)  03/29/14 95.981 kg (211 lb 9.6 oz)    Physical Exam:  Constitutional: He is oriented to person, place, and time. He appears well-developed.  HENT: oral mucosa pink and moist Craniotomy site with staples intact. Clean. Old blood around incision Eyes: EOM are normal.  Neck: Normal range of motion. Neck supple. No thyromegaly present.  Cardiovascular: Normal rate and regular rhythm.  Respiratory: Effort normal and breath sounds normal. No respiratory distress.  GI: Soft. Bowel sounds are normal. He exhibits no distension.  Neurological: He is alert and oriented to person, place, and time.  Follows commands. Good insight and awareness. Memory intact. Language normal.no CN deficits.  Right upper extremity 5/5 deltoid, biceps, triceps, grip, left upper extremity 4+/5 in the deltoid, bicep, tricep, grip. 4/5 right hip flexor knee extensor and ankle dorsiflexor plantar flexor. Left lower extremity Trace to 1/5 hip flexor knee extensor, 0/5 ankle dorsiflexor plantar flexor Sensation sl diminished to LT left arm and leg. Mood and affect are appropriate No left neglect. No abnormal tone.  Skin:  Skin is warm and dry.  Psych: mood remains pleasant and up beat  Assessment/Plan: 1. Functional deficits secondary to right frontal meningioma, metastatic lung cancer which require 3+ hours per day of interdisciplinary therapy in a comprehensive inpatient rehab setting. Physiatrist is providing close team supervision and 24 hour management of active medical problems listed below. Physiatrist and rehab team continue to assess barriers to discharge/monitor patient progress toward functional and medical goals. FIM: FIM - Bathing Bathing Steps Patient Completed: Chest, Right upper leg, Left upper leg, Right Arm, Left Arm, Right lower leg (including foot), Left lower leg (including foot), Abdomen, Front perineal area, Buttocks Bathing: 5: Set-up assist to: Obtain items  FIM - Upper Body Dressing/Undressing Upper body dressing/undressing steps patient completed: Thread/unthread right sleeve of pullover shirt/dresss, Thread/unthread left sleeve of pullover shirt/dress, Put head through opening of pull over shirt/dress, Pull shirt over trunk Upper body dressing/undressing: 5: Set-up assist to: Obtain clothing/put away FIM - Lower Body Dressing/Undressing Lower body dressing/undressing steps patient completed: Don/Doff right sock, Don/Doff left sock Lower body dressing/undressing: 2: Max-Patient completed 25-49% of tasks  FIM - Toileting Toileting steps completed by patient: Adjust clothing prior to toileting, Adjust clothing after toileting, Performs perineal hygiene Toileting Assistive Devices: Grab bar or rail for support Toileting: 4: Steadying assist  FIM - Radio producer Devices: Elevated toilet seat, Grab bars Toilet Transfers: 3-From toilet/BSC: Mod A (lift or lower assist), 3-To toilet/BSC: Mod A (lift or lower assist)  FIM - Control and instrumentation engineer Devices: Arm rests Bed/Chair  Transfer: 4: Bed > Chair or W/C: Min A (steadying Pt. >  75%), 4: Chair or W/C > Bed: Min A (steadying Pt. > 75%)  FIM - Locomotion: Wheelchair Distance: 150 Locomotion: Wheelchair: 5: Travels 150 ft or more: maneuvers on rugs and over door sills with supervision, cueing or coaxing FIM - Locomotion: Ambulation Locomotion: Ambulation Assistive Devices: Administrator Ambulation/Gait Assistance: 3: Mod assist, 1: +2 Total assist (+2 for w/c follow) Locomotion: Ambulation: 1: Two helpers  Comprehension Comprehension Mode: Auditory Comprehension: 7-Follows complex conversation/direction: With no assist  Expression Expression Mode: Verbal Expression: 6-Expresses complex ideas: With extra time/assistive device  Social Interaction Social Interaction: 6-Interacts appropriately with others with medication or extra time (anti-anxiety, antidepressant).  Problem Solving Problem Solving: 5-Solves complex 90% of the time/cues < 10% of the time  Memory Memory Mode: Asleep Memory: 5-Recognizes or recalls 90% of the time/requires cueing < 10% of the time   Medical Problem List and Plan: 1. Functional deficits secondary to Right frontal meningioma status post resection with left hemiparesis 2. DVT Prophylaxis/Anticoagulation: Pharmaceutical: Lovenox--cleared to initiate per Dr. Kathyrn Sheriff 3. Pain Management: Will continue hydrocodone prn.  4. Mood: Reports anxiety/restlessness due to steroids.   low dose xanax prn. LCSW to follow for evaluation and support.  5. Neuropsych: This patient is capable of making decisions on his own behalf. 6. Skin/Wound Care: Routine pressure relief measures.  7. Fluids/Electrolytes/Nutrition: Monitor intake. Offer supplements as needed to maintain adequate intake.  8. DM type 2: Monitor BS ac/hs and continue Levemir for now. ON amaryl PTA---currently on metformin bid additionally. may resume amaryl this wweek 9. Seizure prophylaxis: Continue Keppra bid.  10. HTN: Continue vasotec daily. Monitor BP every 8 hours and  adjust as needed.   12 Constipation: moved bowels yesterday 13. Oncology: necrotic prostrate on CT-for bx of prostate at [facility]  -GI consult for lesion in cecum?  -dr. Benay Spice following  -rad/onc following   t  LOS (Days) 5 A FACE TO FACE EVALUATION WAS PERFORMED  Louis Gaw T 04/16/2014 8:01 AM

## 2014-04-16 NOTE — Progress Notes (Signed)
Occupational Therapy Session Note  Patient Details  Name: Ivan Stark MRN: [id number] Date of Birth: 1946-04-26  Today's Date: 04/16/2014 OT Individual Time: 0700-0800 OT Individual Time Calculation (min): 60 min    Short Term Goals: Week 1:  OT Short Term Goal 1 (Week 1): Bath: performed in sit and stand with steady assist and use of LH sponge PRN. OT Short Term Goal 2 (Week 1): LB dressing: min assist to include sit and stand and AE PRN OT Short Term Goal 3 (Week 1): Toilet transfer: Min assist  OT Short Term Goal 4 (Week 1): Toileting: Min assist OT Short Term Goal 5 (Week 1): Simple kitchen tasks: Supervision at a w/c level  Skilled Therapeutic Interventions/Progress Updates:    Pt resting in bed upon arrival and agreeable to bathing and dressing.  Pt required min a with sitting balance once seated EOB.  Pt performed squat pivot transfer to w/c before propelling to bathroom for toileting.  Pt engaged in bathing tasks seated with lateral leans to bathe buttocks.  Pt used long handle sponge to bathe BLE.  Pt completed dressing tasks with sit<>stand from w/c at sink.  Pt required steady A for sit<>stand and standing at sink to pull up pants.  Pt able to shift weight to left on command requiring min A for stabilization of left knee during weight shifts.  Pt completed grooming tasks seated in w/c.  Focus on activity tolerance, sitting balance, standing balance, sit<>stand, transfers, and safety awareness.  Therapy Documentation Precautions:  Precautions Precautions: Fall Precaution Comments: LLE hemiplegia Restrictions Weight Bearing Restrictions: No   Pain: Pain Assessment Pain Assessment: No/denies pain    See FIM for current functional status  Therapy/Group: Individual Therapy  Leroy Libman 04/16/2014, 8:07 AM

## 2014-04-16 NOTE — Progress Notes (Signed)
Physical Therapy Session Note  Patient Details  Name: Ivan Stark MRN: [id number] Date of Birth: 02-20-1946  Today's Date: 04/16/2014 PT Missed Time: 60 Minutes Missed Time Reason: Other (Comment) (Procedure at [facility])   Skilled Therapeutic Interventions/Progress Updates:  Pt missed 61minutes of scheduled skilled physical therapy due to procedure at [facility]. Will continue per POC as appropriate and able.   Therapy Documentation Precautions:  Precautions Precautions: Fall Precaution Comments: LLE hemiplegia Restrictions Weight Bearing Restrictions: No General: PT Amount of Missed Time (min): 60 Minutes PT Missed Treatment Reason: Other (Comment) (Procedure at [facility])  See FIM for current functional status  Therapy/Group: Individual Therapy  Gilmore Laroche 04/16/2014, 2:43 PM

## 2014-04-16 NOTE — Progress Notes (Signed)
Social Work Patient ID: Ivan Stark, male   DOB: 1945/12/20, 68 y.o.   MRN: [id number] Spoke with [name] regarding pt needing to go to [facility] office for a procedure.  Procedure scheduled for 1;00 pm.  Contated PTAR to transport at 12;15.  [name] and pt aware.

## 2014-04-16 NOTE — Progress Notes (Signed)
Physical Therapy Session Note  Patient Details  Name: Ivan Stark MRN: [id number] Date of Birth: 14-May-1946  Today's Date: 04/16/2014 PT Individual Time: 1015-1100 PT Individual Time Calculation (min): 45 min   Short Term Goals: Week 1:  PT Short Term Goal 1 (Week 1): Pt will perform bed mobility with min A.  PT Short Term Goal 2 (Week 1): Pt will transfer bed <> w/c wtih min A.  PT Short Term Goal 3 (Week 1): Pt will ambulate 25 ft with mod A.  PT Short Term Goal 4 (Week 1): Pt will negotiate 3 stairs with 2 rails and mod A.      Skilled Therapeutic Interventions/Progress Updates:  wc mobility using bil hands x 150' with supervision.  neuromuscular re-education via forced use, multi-modal cues for standing: L knee and hip stability with wt bearing and RLe step/taps; sitting: L hip shortening/lengthening with reaching out of BOS either hand, pelvic dissociation in R lateral lean in sitting; gait.  Pt had LOB 1/16 while reaching for objects.    Gait with L AFO on, RW, x 30' ,mod > min assist for technique, multimodal cues for L hip and quad activation.  Pt had excessive weight bil UEs on RW,and stated he was holding his breath.  Instruction give for diaphragmatic breathing.  W/c> bed lateral scoot to R with min guard assist; cues to bring L hip onto bed completely.  RN entered for NSG care.  Therapy Documentation Precautions:  Precautions Precautions: Fall Precaution Comments: LLE hemiplegia Restrictions Weight Bearing Restrictions: No General: PT Amount of Missed Time (min): 15 Minutes PT Missed Treatment Reason: Patient unwilling to participate (visiting with a friend to discuss his bills)    Pain: Pain Assessment Pain Assessment: No/denies pain   Locomotion : Ambulation Ambulation/Gait Assistance: 3: Mod assist Wheelchair Mobility Distance: 150     See FIM for current functional status  Therapy/Group: Individual Therapy  Andriana Casa 04/16/2014, 12:53 PM

## 2014-04-17 ENCOUNTER — Inpatient Hospital Stay (HOSPITAL_COMMUNITY): Payer: Medicare Other

## 2014-04-17 ENCOUNTER — Inpatient Hospital Stay (HOSPITAL_COMMUNITY): Payer: Medicare Other | Admitting: *Deleted

## 2014-04-17 ENCOUNTER — Encounter (HOSPITAL_COMMUNITY): Payer: Medicare Other

## 2014-04-17 ENCOUNTER — Inpatient Hospital Stay (HOSPITAL_COMMUNITY): Payer: Medicare Other | Admitting: Occupational Therapy

## 2014-04-17 DIAGNOSIS — C61 Malignant neoplasm of prostate: Secondary | ICD-10-CM

## 2014-04-17 LAB — GLUCOSE, CAPILLARY
Glucose-Capillary: 107 mg/dL — ABNORMAL HIGH (ref 70–99)
Glucose-Capillary: 141 mg/dL — ABNORMAL HIGH (ref 70–99)
Glucose-Capillary: 144 mg/dL — ABNORMAL HIGH (ref 70–99)
Glucose-Capillary: 184 mg/dL — ABNORMAL HIGH (ref 70–99)

## 2014-04-17 NOTE — Plan of Care (Signed)
Problem: RH BOWEL ELIMINATION Goal: RH STG MANAGE BOWEL WITH ASSISTANCE STG Manage Bowel with min Assistance.  Outcome: Progressing  Problem: RH SKIN INTEGRITY Goal: RH STG SKIN FREE OF INFECTION/BREAKDOWN With min. assisst  Outcome: Progressing Goal: RH STG MAINTAIN SKIN INTEGRITY WITH ASSISTANCE STG Maintain Skin Integrity With min.Assistance.  Outcome: Progressing  Problem: RH SAFETY Goal: RH STG ADHERE TO SAFETY PRECAUTIONS W/ASSISTANCE/DEVICE STG Adhere to Safety Precautions With mod. Assistance/Device.  Outcome: Progressing Goal: RH STG DECREASED RISK OF FALL WITH ASSISTANCE STG Decreased Risk of Fall With Assistance.  Outcome: Progressing

## 2014-04-17 NOTE — Progress Notes (Signed)
Social Work Patient ID: Ivan Stark, male   DOB: 02-24-1946, 68 y.o.   MRN: [id number]   Met with pt and wife following team conference.  Both aware and agreeable with targeted d/c date of 11/21 with supervision/ min assist goals.  Both very somber with ongoing oncology news but report "we're doing ok".  Discussed recommendation for neuropsychology consult and pt very agreeable.  Will continue to follow for support and d/c planning needs.  [name], LCSW

## 2014-04-17 NOTE — Progress Notes (Signed)
Physical Therapy Session Note  Patient Details  Name: Ivan Stark MRN: [id number] Date of Birth: 22-Mar-1946  Today's Date: 04/17/2014 PT Individual Time: 1004-1104 PT Individual Time Calculation (min): 60 min   Short Term Goals: Week 1:  PT Short Term Goal 1 (Week 1): Pt will perform bed mobility with min A.  PT Short Term Goal 2 (Week 1): Pt will transfer bed <> w/c wtih min A.  PT Short Term Goal 3 (Week 1): Pt will ambulate 25 ft with mod A.  PT Short Term Goal 4 (Week 1): Pt will negotiate 3 stairs with 2 rails and mod A.   Skilled Therapeutic Interventions/Progress Updates:  Tx focused on functional mobility training, WC mobility, gait in // bars, and NMR via forced use, manual facilitation, and multi-modal cues. Pt up in [facility], needing to use bathroom.   Instructed pt in squat-pivot transfers with cues for set-up and LE placement for efficiency. Pt needed Min A overall for transfers, but needed assist for pants management.   Pt propelled WC in controlled setting x150' and around room with distant S. Pt does nice job with Designer, television/film set.   Performed extensive NMR in // bars with mirror for visual feedback as well as manual facilitation for LLE hip and knee ext and control in stance and transitions. Performed the following tasks for increased forced use:  - Bobath sit<>stands with bil UEs on L knee, accentuating L weight shift for forced use. Pt required 2 air-x pads on WC cushion to perform due to long LE length. Performed with up to Max A for anterior translation and lifting/lowering.  - Lateral weight shifting with 1/2 UE support - mini-squats  - RLE step taps to 4" step - mini-squats with RLE on 4" step  Pt very fatigued with each trial, needing seated rest for recovery. Educated pt on principles of forced use and recovery.   Gait training in // bars 2 trials forward and backwards with AFO and shoes. Pt needed only Min A with extra support of // bars, including manual  facilitation for hip/knee control in stance and weight shifting with L swing. Pt continues to have L LOB during stance due to decreased hip ex. Pt able to advance LLE 100% of the time, but with smaller step length.   Pt left up in [facility] with all needs in reach.       Therapy Documentation Precautions:  Precautions Precautions: Fall Precaution Comments: LLE hemiplegia Restrictions Weight Bearing Restrictions: No    Pain: Pain Assessment Pain Assessment: No/denies pain    Locomotion : Ambulation Ambulation/Gait Assistance: 4: Min assist Wheelchair Mobility Distance: 150   See FIM for current functional status  Therapy/Group: Individual Therapy  Kennieth Rad, [place]  04/17/2014, 11:14 AM

## 2014-04-17 NOTE — Progress Notes (Signed)
Orthopedic Tech Progress Note Patient Details:  [name] 06-20-1945 [id number]  Patient ID: Ivan Stark, male   DOB: Apr 02, 1946, 68 y.o.   MRN: [id number] Brace order completed by advanced prosthetics  Denece Shearer 04/17/2014, 4:54 PM

## 2014-04-17 NOTE — Plan of Care (Signed)
Problem: RH BOWEL ELIMINATION Goal: RH STG MANAGE BOWEL WITH ASSISTANCE STG Manage Bowel with min Assistance.  Outcome: Progressing Goal: RH STG MANAGE BOWEL W/MEDICATION W/ASSISTANCE STG Manage Bowel with Medication with min. Assistance.  Outcome: Progressing  Problem: RH SKIN INTEGRITY Goal: RH STG SKIN FREE OF INFECTION/BREAKDOWN With min. assisst  Outcome: Progressing Goal: RH STG MAINTAIN SKIN INTEGRITY WITH ASSISTANCE STG Maintain Skin Integrity With min.Assistance.  Outcome: Progressing Goal: RH OTHER STG SKIN INTEGRITY GOALS W/ASSIST Other STG Skin Integrity Goals With Assistance.  Outcome: Progressing  Problem: RH SAFETY Goal: RH STG ADHERE TO SAFETY PRECAUTIONS W/ASSISTANCE/DEVICE STG Adhere to Safety Precautions With mod. Assistance/Device.  Outcome: Progressing Goal: RH STG DECREASED RISK OF FALL WITH ASSISTANCE STG Decreased Risk of Fall With Assistance.  Outcome: Progressing

## 2014-04-17 NOTE — Patient Care Conference (Signed)
Inpatient RehabilitationTeam Conference and Plan of Care Update Date: 04/17/2014   Time: 2:50 PM    Patient Name: Ivan Stark      Medical Record Number: [id number]  Date of Birth: [birth year] Sex: Male         Room/Bed: 4M07C/4M07C-01 Payor Info: Payor: [place] [facility] [place] MEDICARE / Plan: BLUE MEDICARE / Product Type: *No Product type* /    Admitting Diagnosis: tumor resection  Crani  Admit Date/Time:  04/11/2014  4:44 PM Admission Comments: No comment available   Primary Diagnosis:  <principal problem not specified> Principal Problem: <principal problem not specified>  Patient Active Problem List   Diagnosis Date Noted  . Small cell carcinoma   . Vision disturbance   . Metastatic cancer to brain 04/11/2014  . Cerebral meningioma   . Left flaccid hemiparesis   . Left hemiparesis 04/09/2014  . 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 Poor compliance with F/U 02/04/2014  . 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 Encounter for long-term (current) use of other medications 08/20/2013  . Hypertension   . Hyperlipidemia   . GERD (gastroesophageal reflux disease)   . Vitamin D deficiency   . History of colon polyps     Expected Discharge Date: Expected Discharge Date: 04/28/14  Team Members Present: Physician leading conference: Dr. Alger Simons Social Worker Present: Lennart Pall, LCSW Nurse Present: Elliot Cousin, RN PT Present: Otis Brace, PT OT Present: Willeen Cass, OT;Roanna Epley, Griffin Basil, OT SLP Present: Weston Anna, SLP PPS Coordinator present : Daiva Nakayama, RN, CRRN     Current Status/Progress Goal Weekly Team Focus  Medical   mestatic smal cell ca to brain s/p resection. ?source/? prostate  improve functional mobility  cancer work up. orthotics   Bowel/Bladder   Continent to bowel and bladder.  To continue continent to bowel and bladder.  To monitor in and out put Q shift.   Swallow/Nutrition/ Hydration             ADL's    functional transfers-min/mod A; LB bathing/dressing-min A; slight impulsivity  supervision overall  family education; transfers; standing balance, safety awareness   Mobility   Supervision for w/c propulsion; Min A for transfers; Min-mod A for ambulation up to 41' with RW  Supervision overall  functional endurance, transfers, ambulation, stairs, w/c propulsion, L LE NMR, standing balance, safety awareness   Communication             Safety/Cognition/ Behavioral Observations            Pain   No complain of pai  To keep pain level less than 3. on scale 1 to 10  To monitor pain levels Q 2 hrs.   Skin   No skin breakdown.Incision on scalp dry and intact.  To keep incision clean and dry,free from infections.  To monitor skin Q shift.    Rehab Goals Patient on target to meet rehab goals: Yes *See Care Plan and progress notes for long and short-term goals.  Barriers to Discharge: left leg weakness    Possible Resolutions to Barriers:  prafo, education, heel cord stretching    Discharge Planning/Teaching Needs:  home with wife who is prepared to provide any assistance needed      Team Discussion:  Oncology still following as they determine paths and plans for future treatments.  Anticipate reaching supervision/ min assist goals.  Having good functional return in LLE.  Mood is down understandably but recommending neuropsych consult.  Revisions to  Treatment Plan:  None   Continued Need for Acute Rehabilitation Level of Care: The patient requires daily medical management by a physician with specialized training in physical medicine and rehabilitation for the following conditions: Daily direction of a multidisciplinary physical rehabilitation program to ensure safe treatment while eliciting the highest outcome that is of practical value to the patient.: Yes Daily medical management of patient stability for increased activity during participation in an intensive rehabilitation regime.:  Yes Daily analysis of laboratory values and/or radiology reports with any subsequent need for medication adjustment of medical intervention for : Post surgical problems;Neurological problems;Other  Kahlea Cobert, [place] 04/17/2014, 4:43 PM

## 2014-04-17 NOTE — Progress Notes (Signed)
Occupational Therapy Session Note  Patient Details  Name: Ivan Stark MRN: [id number] Date of Birth: December 08, 1945  Today's Date: 04/17/2014 OT Individual Time: 0700-0800 OT Individual Time Calculation (min): 60 min    Short Term Goals: Week 1:  OT Short Term Goal 1 (Week 1): Bath: performed in sit and stand with steady assist and use of LH sponge PRN. OT Short Term Goal 2 (Week 1): LB dressing: min assist to include sit and stand and AE PRN OT Short Term Goal 3 (Week 1): Toilet transfer: Min assist  OT Short Term Goal 4 (Week 1): Toileting: Min assist OT Short Term Goal 5 (Week 1): Simple kitchen tasks: Supervision at a w/c level  Skilled Therapeutic Interventions/Progress Updates:    Pt engaged in BADL retraining including toileting, bathing at shower level, and dressing with sit<>stand from w/c.  Pt requires min A for squat pivot transfers to toilet, shower, and w/c.  Pt continues to require min verbal cues for RLE placement prior to transfers.  Pt requires min A for standing balance when standing to pull up pants.  Focus on activity tolerance, sit<>stand, standing balance, transfers, and safety awareness.  Therapy Documentation Precautions:  Precautions Precautions: Fall Precaution Comments: LLE hemiplegia Restrictions Weight Bearing Restrictions: No Pain: Pain Assessment Pain Assessment: No/denies pain  See FIM for current functional status  Therapy/Group: Individual Therapy  Leroy Libman 04/17/2014, 8:59 AM

## 2014-04-17 NOTE — Progress Notes (Signed)
Occupational Therapy Session Note  Patient Details  Name: Ivan Stark MRN: [id number] Date of Birth: Jun 26, 1945  Today's Date: 04/17/2014 OT Individual Time: 0900-1000 OT Individual Time Calculation (min): 60 min   Short Term Goals: Week 1:  OT Short Term Goal 1 (Week 1): Bath: performed in sit and stand with steady assist and use of LH sponge PRN. OT Short Term Goal 2 (Week 1): LB dressing: min assist to include sit and stand and AE PRN OT Short Term Goal 3 (Week 1): Toilet transfer: Min assist  OT Short Term Goal 4 (Week 1): Toileting: Min assist OT Short Term Goal 5 (Week 1): Simple kitchen tasks: Supervision at a w/c level  Skilled Therapeutic Interventions/Progress Updates:  Patient resting in w/c upon arrival.  Engaged in review of OT LTGs, in-depth discussion of bathroom set up to begin practicing with the context of his home environment and discharge planning.  Patient reports that his wife is reluctant to begin talking about possible need to make arrangements to complete bathroom tasks differently from before.  However, patient appears realistic and willing to talk about options and possibilities.  He also stated that he has family and church members who are contractors ans have stated that they will do any remodeling neccessary. Patient then assisted with drawing both bathrooms and stated he felt like the hall bathroom would be the best to plan to use.  He will have someone take measurements to allow OT to assist and plan for safe discharge.  Patient propelled w/c to therapy bathroom, practiced "dry run" of squat pivot w/c><tub bench transfer, sit><stand with use of grab bars to simulate stand to wash buttocks and peri area.  Patient agrees with recommendation to install 1-2 grab bars and hand held shower.  He will also begin asking church members and other folks in his network if there is a tub bench he can have or borrow.  Therapy Documentation Precautions:   Precautions Precautions: Fall Precaution Comments: LLE hemiplegia Restrictions Weight Bearing Restrictions: No Pain: Pain Assessment Pain Assessment: No/denies pain  Therapy/Group: Individual Therapy  Ladaija Dimino 04/17/2014, 10:53 AM

## 2014-04-17 NOTE — Progress Notes (Signed)
Orthopedic Tech Progress Note Patient Details:  Ivan Stark 1946/01/26 [id number]  Patient ID: Ivan Stark, male   DOB: 11-18-1945, 68 y.o.   MRN: [id number] Called in advanced brace order; spoke with Emmit Pomfret, Vikrant Pryce 04/17/2014, 9:04 AM

## 2014-04-17 NOTE — Progress Notes (Signed)
[facility] REHABILITATION     PROGRESS NOTE    Subjective/Complaints: Tired from yesterday. Still in good spirits. No dysuria, moving bowels. Denies pain  Objective: Vital Signs: Blood pressure 107/66, pulse 64, temperature 98 F (36.7 C), temperature source Oral, resp. rate 18, height 6\' 4"  (1.93 m), weight 91.536 kg (201 lb 12.8 oz), SpO2 97 %. No results found. No results for input(s): WBC, HGB, HCT, PLT in the last 72 hours. No results for input(s): NA, K, CL, GLUCOSE, BUN, CREATININE, CALCIUM in the last 72 hours.  Invalid input(s): CO CBG (last 3)   Recent Labs  04/16/14 1656 04/16/14 2059 04/17/14 0649  GLUCAP 203* 254* 107*    Wt Readings from Last 3 Encounters:  04/11/14 91.536 kg (201 lb 12.8 oz)  04/06/14 94.8 kg (208 lb 15.9 oz)  03/29/14 95.981 kg (211 lb 9.6 oz)    Physical Exam:  Constitutional: He is oriented to person, place, and time. He appears well-developed.  HENT: oral mucosa pink and moist Craniotomy site with staples intact. Clean. Old blood around incision Eyes: EOM are normal.  Neck: Normal range of motion. Neck supple. No thyromegaly present.  Cardiovascular: Normal rate and regular rhythm.  Respiratory: Effort normal and breath sounds normal. No respiratory distress.  GI: Soft. Bowel sounds are normal. He exhibits no distension.  Neurological: He is alert and oriented to person, place, and time.  Follows commands. Good insight and awareness. Memory intact. Language normal.no CN deficits.  Right upper extremity 5/5 deltoid, biceps, triceps, grip, left upper extremity 4+/5 in the deltoid, bicep, tricep, grip. 4/5 right hip flexor knee extensor and ankle dorsiflexor plantar flexor. Left lower extremity Trace to 1/5 hip flexor knee extensor, 0/5 ankle dorsiflexor plantar flexor Sensation sl diminished to LT left arm and leg. Mood and affect are appropriate No left neglect. No abnormal tone.  Skin: Skin is warm and  dry.  Psych: mood remains pleasant and up beat  Assessment/Plan: 1. Functional deficits secondary to right frontal meningioma, metastatic lung cancer which require 3+ hours per day of interdisciplinary therapy in a comprehensive inpatient rehab setting. Physiatrist is providing close team supervision and 24 hour management of active medical problems listed below. Physiatrist and rehab team continue to assess barriers to discharge/monitor patient progress toward functional and medical goals. FIM: FIM - Bathing Bathing Steps Patient Completed: Chest, Right upper leg, Left upper leg, Right Arm, Left Arm, Right lower leg (including foot), Left lower leg (including foot), Abdomen, Front perineal area, Buttocks Bathing: 4: Steadying assist  FIM - Upper Body Dressing/Undressing Upper body dressing/undressing steps patient completed: Thread/unthread right sleeve of pullover shirt/dresss, Thread/unthread left sleeve of pullover shirt/dress, Put head through opening of pull over shirt/dress, Pull shirt over trunk Upper body dressing/undressing: 5: Set-up assist to: Obtain clothing/put away FIM - Lower Body Dressing/Undressing Lower body dressing/undressing steps patient completed: Thread/unthread right underwear leg, Thread/unthread left underwear leg, Pull underwear up/down, Thread/unthread right pants leg, Thread/unthread left pants leg, Pull pants up/down Lower body dressing/undressing: 4: Min-Patient completed 75 plus % of tasks  FIM - Toileting Toileting steps completed by patient: Adjust clothing prior to toileting, Performs perineal hygiene, Adjust clothing after toileting Toileting Assistive Devices: Grab bar or rail for support Toileting: 4: Steadying assist  FIM - Radio producer Devices: Elevated toilet seat, Grab bars Toilet Transfers: 4-To toilet/BSC: Min A (steadying Pt. > 75%), 4-From toilet/BSC: Min A (steadying Pt. > 75%)  FIM - Pensions consultant  Assistive Devices: Arm rests Bed/Chair Transfer: 4: Sit > Supine: Min A (steadying pt. > 75%/lift 1 leg), 4: Chair or W/C > Bed: Min A (steadying Pt. > 75%), 4: Bed > Chair or W/C: Min A (steadying Pt. > 75%)  FIM - Locomotion: Wheelchair Distance: 150 Locomotion: Wheelchair: 5: Travels 150 ft or more: maneuvers on rugs and over door sills with supervision, cueing or coaxing FIM - Locomotion: Ambulation Locomotion: Ambulation Assistive Devices: Engineer, agricultural Ambulation/Gait Assistance: 3: Mod assist Locomotion: Ambulation: 1: Travels less than 50 ft with moderate assistance (Pt: 50 - 74%)  Comprehension Comprehension Mode: Auditory Comprehension: 7-Follows complex conversation/direction: With no assist  Expression Expression Mode: Verbal Expression: 6-Expresses complex ideas: With extra time/assistive device  Social Interaction Social Interaction: 6-Interacts appropriately with others with medication or extra time (anti-anxiety, antidepressant).  Problem Solving Problem Solving: 5-Solves complex 90% of the time/cues < 10% of the time  Memory Memory Mode: Asleep Memory: 6-More than reasonable amt of time   Medical Problem List and Plan: 1. Functional deficits secondary to Right frontal meningioma status post resection with left hemiparesis 2. DVT Prophylaxis/Anticoagulation: Pharmaceutical: Lovenox--cleared to initiate per Dr. Kathyrn Sheriff 3. Pain Management: Will continue hydrocodone prn.  4. Mood: Reports anxiety/restlessness due to steroids.   low dose xanax prn. LCSW to follow for evaluation and support.  5. Neuropsych: This patient is capable of making decisions on his own behalf. 6. Skin/Wound Care: Routine pressure relief measures.  7. Fluids/Electrolytes/Nutrition: Monitor intake. Offer supplements as needed to maintain adequate intake.  8. DM type 2: Monitor BS ac/hs and continue Levemir for now. ON amaryl PTA---currently on metformin  bid additionally. may resume amaryl this wweek 9. Seizure prophylaxis: Continue Keppra bid.  10. HTN: Continue vasotec daily. Monitor BP every 8 hours and adjust as needed.   12 Constipation: moved bowels yesterday 13. Oncology: necrotic prostrate on CT-prostate bx'ed yesterday by urology  -GI consult for lesion in cecum?  -dr. Benay Spice following  -rad/onc following    LOS (Days) 6 A FACE TO FACE EVALUATION WAS PERFORMED  SWARTZ,ZACHARY T 04/17/2014 7:40 AM

## 2014-04-17 NOTE — Progress Notes (Signed)
IP PROGRESS NOTE  Subjective:   He appears stable.he reports no difficulty with urination. He has constipation. He feels the left leg weakness is improving.  Objective: Vital signs in last 24 hours: Blood pressure 92/60, pulse 92, temperature 98.4 F (36.9 C), temperature source Oral, resp. rate 18, height 6\' 4"  (1.93 m), weight 201 lb 12.8 oz (91.536 kg), SpO2 100 %.  Intake/Output from previous day: 11/09 0701 - 11/10 0700 In: 720 [P.O.:720] Out: 1701 [Urine:1700; Stool:1]  Physical Exam: 3/5 strength in the left leg  Lab Results:  PSA on 04/13/2014-4.21  Medications: I have reviewed the patient's current medications.  Assessment/Plan: 1. 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 Left frontal meningioma, WHO grade 1, status post resection 04/06/2014  3. Left leg weakness secondary to #1  4. 1 cm  brain lesion at the inferior falx noted on the MRI 03/21/2014, not resected  5.  Diabetes  6.  Hypertension  7.  Hyperlipidemia  8.  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 I discussed the diagnosis with him. I explained the probable treatment course with systemic chemotherapy and radiation. I will coordinate the treatment plan with Dr. Tammi Klippel. I discussed the case with Dr. Gaynelle Arabian. He reports a large prostate mass with the potential for rectal involvement. I think we should consider initiating treatment while Ivan Stark is on the rehabilitation medicine service.  Recommendations: 1. Continue physical therapy per Dr. Naaman Plummer 2. Consult gastroenterology to consider a  colonoscopy to evaluate the cecum mass 3. I will continue following him in the hospital. I will develop a treatment plan with Dr. Tammi Klippel. He will likely begin treatment within the next 1 week.    LOS: 6 days   [place]  04/17/2014, 4:29 PM

## 2014-04-17 NOTE — Progress Notes (Signed)
Physical Therapy Session Note  Patient Details  Name: Ivan Stark MRN: [id number] Date of Birth: [birth year]  Today's Date: 04/17/2014 PT Individual Time: 1300-1400 PT Individual Time Calculation (min): 60 min   Short Term Goals: Week 1:  PT Short Term Goal 1 (Week 1): Pt will perform bed mobility with min A.  PT Short Term Goal 2 (Week 1): Pt will transfer bed <> w/c wtih min A.  PT Short Term Goal 3 (Week 1): Pt will ambulate 25 ft with mod A.  PT Short Term Goal 4 (Week 1): Pt will negotiate 3 stairs with 2 rails and mod A.   Skilled Therapeutic Interventions/Progress Updates:  1:1. Pt received semi-reclined in bed resting, ready for therapy. Focus this session on functional endurance during w/c propulsion, transfers, NMR in standing and gait training. Pt able to t/f sup<>sit EOB with min A for management of L LE. Pt able to safely perform multiple lateral scoot/squat pivot transfers w/c<>bed or w/c<>bench. Pt req min cues for management of w/c parts. Pt demonstrated excellent endurance to w/c propulsion 200'x2, 350'x1 and 475'x1 with B UE in controlled and community environments including: on/off elevators, in busy hospital lobby, outside on uneven surfaces.   Emphasis on NMR in standing to target coordination, strength and timing of L LE during squatting and reaching for targets to both L and R sides with/in BOS. Therapist manually facilitating L knee flexion/extension and B weightshifting.   Pt req min-mod A for ambulation 40'x1 with RW, therapist manually facilitating L knee control, advancement of L LE and increased hip extension. Pt demonstrated increased L knee flexion and genu recurvatum with fatigue. Pt continues to benefit from use of L AFO for increased stability and foot clearance.   Pt left supine in bed at end of session w/ all needs in reach.   Therapy Documentation Precautions:  Precautions Precautions: Fall Precaution Comments: LLE hemiplegia Restrictions Weight  Bearing Restrictions: No  See FIM for current functional status  Therapy/Group: Individual Therapy  Gilmore Laroche 04/17/2014, 3:11 PM

## 2014-04-18 ENCOUNTER — Ambulatory Visit
Admit: 2014-04-18 | Discharge: 2014-04-18 | Disposition: A | Discharge status: Home / Self Care | Payer: Medicare Other | Attending: Radiation Oncology | Admitting: Radiation Oncology

## 2014-04-18 ENCOUNTER — Encounter (HOSPITAL_COMMUNITY): Payer: Medicare Other

## 2014-04-18 ENCOUNTER — Inpatient Hospital Stay (HOSPITAL_COMMUNITY): Payer: Medicare Other

## 2014-04-18 ENCOUNTER — Inpatient Hospital Stay (HOSPITAL_COMMUNITY): Payer: Medicare Other | Admitting: Occupational Therapy

## 2014-04-18 DIAGNOSIS — E1165 Type 2 diabetes mellitus with hyperglycemia: Secondary | ICD-10-CM

## 2014-04-18 DIAGNOSIS — C7931 Secondary malignant neoplasm of brain: Secondary | ICD-10-CM

## 2014-04-18 LAB — GLUCOSE, CAPILLARY
GLUCOSE-CAPILLARY: 124 mg/dL — AB (ref 70–99)
GLUCOSE-CAPILLARY: 85 mg/dL (ref 70–99)
Glucose-Capillary: 136 mg/dL — ABNORMAL HIGH (ref 70–99)
Glucose-Capillary: 103 mg/dL — ABNORMAL HIGH (ref 70–99)

## 2014-04-18 MED ORDER — BISACODYL 5 MG PO TBEC
10.0000 mg | DELAYED_RELEASE_TABLET | Freq: Once | ORAL | Status: AC
  Administered 2014-04-18: 10 mg via ORAL
  Filled 2014-04-18: qty 2

## 2014-04-18 MED ORDER — PEG 3350-KCL-NA BICARB-NACL 420 G PO SOLR
4000.0000 mL | Freq: Once | ORAL | Status: AC
  Administered 2014-04-18: 4000 mL via ORAL
  Filled 2014-04-18: qty 4000

## 2014-04-18 MED ORDER — GLIMEPIRIDE 2 MG PO TABS
2.0000 mg | ORAL_TABLET | Freq: Every day | ORAL | Status: DC
  Administered 2014-04-18: 2 mg via ORAL
  Filled 2014-04-18 (×4): qty 1

## 2014-04-18 NOTE — Plan of Care (Signed)
Problem: RH BOWEL ELIMINATION Goal: RH STG MANAGE BOWEL WITH ASSISTANCE STG Manage Bowel with min Assistance.  Outcome: Progressing Goal: RH STG MANAGE BOWEL W/MEDICATION W/ASSISTANCE STG Manage Bowel with Medication with min. Assistance.  Outcome: Progressing  Problem: RH SKIN INTEGRITY Goal: RH STG SKIN FREE OF INFECTION/BREAKDOWN With min. assisst  Outcome: Progressing Goal: RH STG MAINTAIN SKIN INTEGRITY WITH ASSISTANCE STG Maintain Skin Integrity With min.Assistance.  Outcome: Progressing Goal: RH OTHER STG SKIN INTEGRITY GOALS W/ASSIST Other STG Skin Integrity Goals With Assistance.  Outcome: Progressing  Problem: RH SAFETY Goal: RH STG ADHERE TO SAFETY PRECAUTIONS W/ASSISTANCE/DEVICE STG Adhere to Safety Precautions With mod. Assistance/Device.  Outcome: Progressing Goal: RH STG DECREASED RISK OF FALL WITH ASSISTANCE STG Decreased Risk of Fall With Assistance.  Outcome: Progressing

## 2014-04-18 NOTE — Progress Notes (Signed)
Occupational Therapy Session Note  Patient Details  Name: Ivan Stark MRN: [id number] Date of Birth: August 28, 1945  Today's Date: 04/18/2014 OT Individual Time: 1401-1433 OT Individual Time Calculation (min): 32 min    Short Term Goals: Week 1:  OT Short Term Goal 1 (Week 1): Bath: performed in sit and stand with steady assist and use of LH sponge PRN. OT Short Term Goal 2 (Week 1): LB dressing: min assist to include sit and stand and AE PRN OT Short Term Goal 3 (Week 1): Toilet transfer: Min assist  OT Short Term Goal 4 (Week 1): Toileting: Min assist OT Short Term Goal 5 (Week 1): Simple kitchen tasks: Supervision at a w/c level  Skilled Therapeutic Interventions/Progress Updates:  Upon entering the room, pt supine in bed with no c/o pain. Pt performed supine >sit with supervision and sit >supine with min A for L LE. Pt required assistance donning and doffing R shoe for session. Pt propelled wheelchair with B UEs ~ 150 feet independently to ADL kitchen. Pt standing with use of RW and obtaining items from reaching into top of cabinet as well as down low with steady assist for balance. Pt standing for ~ 8 minutes until asking to sit down secondary to fatigue. OT educating pt on energy conservation techniques for ADLs, community mobility - Dr's visits, and during simple kitchen tasks. Pt engaging in conversation and asking questions with education needing to continue.  Therapy Documentation Precautions:  Precautions Precautions: Fall Precaution Comments: LLE hemiplegia Restrictions Weight Bearing Restrictions: No  See FIM for current functional status  Therapy/Group: Individual Therapy  Phineas Semen 04/18/2014, 2:36 PM

## 2014-04-18 NOTE — Progress Notes (Signed)
Occupational Therapy Session Note  Patient Details  Name: Ivan Stark MRN: [id number] Date of Birth: 1945/08/04  Today's Date: 04/18/2014 OT Individual Time: 0700-0800 OT Individual Time Calculation (min): 60 min    Short Term Goals: Week 1:  OT Short Term Goal 1 (Week 1): Bath: performed in sit and stand with steady assist and use of LH sponge PRN. OT Short Term Goal 2 (Week 1): LB dressing: min assist to include sit and stand and AE PRN OT Short Term Goal 3 (Week 1): Toilet transfer: Min assist  OT Short Term Goal 4 (Week 1): Toileting: Min assist OT Short Term Goal 5 (Week 1): Simple kitchen tasks: Supervision at a w/c level  Skilled Therapeutic Interventions/Progress Updates:    Pt engaged in BADL retraining including bathing at shower level and dressing with sit<>stand from w/c at sink.  Pt required min/steady A for squat pivot transfers bed->w/c, w/c<>toilet, and w/c<>tub transfer bench.  Pt required steady A for sit<>stand at sink and min A for standing balance to pull up pants.  Pt reported slight "unsteadyness" when first sitting up on edge of bed.  Pt reported that his "patch" was removed the previous day and that is when he noticed this change.  Focus on activity tolerance, functional transfers, sit<>stand, standing balance, and safety awareness.  Therapy Documentation Precautions:  Precautions Precautions: Fall Precaution Comments: LLE hemiplegia Restrictions Weight Bearing Restrictions: No Pain: Pain Assessment Pain Assessment: No/denies pain  See FIM for current functional status  Therapy/Group: Individual Therapy  Leroy Libman 04/18/2014, 8:55 AM

## 2014-04-18 NOTE — Progress Notes (Signed)
Occupational Therapy Session Note  Patient Details  Name: ADELBERT GASPARD MRN: [id number] Date of Birth: June 29, 1945  Today's Date: 04/18/2014 OT Individual Time: 8413-2440 OT Individual Time Calculation (min): 30 min    Short Term Goals: Week 1:  OT Short Term Goal 1 (Week 1): Bath: performed in sit and stand with steady assist and use of LH sponge PRN. OT Short Term Goal 2 (Week 1): LB dressing: min assist to include sit and stand and AE PRN OT Short Term Goal 3 (Week 1): Toilet transfer: Min assist  OT Short Term Goal 4 (Week 1): Toileting: Min assist OT Short Term Goal 5 (Week 1): Simple kitchen tasks: Supervision at a w/c level  Skilled Therapeutic Interventions/Progress Updates:    Pt engaged in functional amb with RW to access bathroom and amb from toilet->tub bench in tub room.  Pt requested to use toilet and performed toilet transfers and toileting tasks with min A.  Pt required min A for amb with RW to access bathroom.  Pt required min A for sit<>stand from toilet and tub bench.  Focus on activity tolerance, functional amb with RW, transfers, and safety awareness.    Therapy Documentation Precautions:  Precautions Precautions: Fall Precaution Comments: LLE hemiplegia Restrictions Weight Bearing Restrictions: No Pain:  Pt denied pain    Other Treatments:    See FIM for current functional status  Therapy/Group: Individual Therapy  Leroy Libman 04/18/2014, 1:52 PM

## 2014-04-18 NOTE — Progress Notes (Signed)
  Subjective: The patient reports voiding well today. +constipation.  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 ( base, mid, apex; lateral and medial).   Note: Hard, deflecting MASS within  10 cm of anal verge, deflecting finger on rectal exam. Note: pt had negative rectal exam at Primary care physical in March 2015.  Objective: Vital signs in last 24 hours: Temp:  [97.4 F (36.3 C)-97.9 F (36.6 C)] 97.4 F (36.3 C) (11/11 1300) Pulse Rate:  [68-85] 85 (11/11 1300) Resp:  [18] 18 (11/11 1300) BP: (109-111)/(57-66) 111/57 mmHg (11/11 1300) SpO2:  [98 %] 98 % (11/11 1300) Weight:  [88.996 kg (196 lb 3.2 oz)] 88.996 kg (196 lb 3.2 oz) (11/11 1300)A  Intake/Output from previous day: 11/10 0701 - 11/11 0700 In: 53 [P.O.:720] Out: 1875 [Urine:1875] Intake/Output this shift: Total Ivan Stark/O In: 480 [P.O.:480] Out: 325 [Urine:325]  Past Medical History  Diagnosis Date  . Diabetes mellitus without complication   . Hypertension   . Hyperlipidemia   . GERD (gastroesophageal reflux disease)   . Vitamin D deficiency   . History of colon polyps   . Complication of anesthesia     Difficult to wake up; and severe nausea and vomiting  . PONV (postoperative nausea and vomiting)     Physical Exam:  Lungs - Normal respiratory effort, chest expands symmetrically.  Abdomen - Soft, non-tender & non-distended.  Lab Results: No results for input(s): WBC, HGB, HCT in the last 72 hours. BMET No results for input(s): NA, K, CL, CO2, GLUCOSE, BUN, CREATININE, CALCIUM in the last 72 hours. No results for input(s): LABURIN in the last 72 hours. Results for orders placed or performed during the hospital encounter of 04/06/14  MRSA PCR Screening     Status: None   Collection Time: 04/06/14  3:23 PM  Result Value Ref Range Status   MRSA by PCR NEGATIVE NEGATIVE Final    Comment:        The GeneXpert MRSA Assay (FDA approved for NASAL  specimens only), is one component of a comprehensive MRSA colonization surveillance program. It is not intended to diagnose MRSA infection nor to guide or monitor treatment for MRSA infections.    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  Normal rectal exam in March  With very large mass now speaks to rapid growth of primary tumor. Concern now for pending rectal obstruction. PSA not useful in following, because this tumor does not make PSA. Prognosis appears to be poor. Ivan Stark have discussed pathology results with the patient and his wife. Case for presentation to tumor board Friday 07: 00. Chemotherapy per Dr. Benay Spice. Colonoscopy per GI tomorrow to evaluate the abnormality of the cecum found on CT.  Urology not needed at this time. Pt is voiding well, with clear urine,  and will have Rx per Dr. Learta Codding.  Plan: Present at tumor board.  Reconsult Urology as needed.  Ivan Stark Ivan Stark 04/18/2014, 5:54 PM

## 2014-04-18 NOTE — Consult Note (Signed)
[facility] CONSULT Reason for consult:Abnormal CT Scan Referring Physician:  Dr Naaman Plummer. PCP: Dr Melford Aase. Primary G.I.: Dr. Cyndia Diver is an 68 y.o. male.  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 He is doing well in rehab. He had colonoscopy by Dr. Amedeo Plenty 2012. This was negative other than hemorrhoids and diverticula. The pathology from the resection of his brain cancers showed metastatic small cell carcinoma as well as meningioma grade 1.as part of his workup CT scan of the chest abdomen and pelvis was obtain which showed no sign of lung cancer, polyps would mass in the cecum with pathologically enlarged mesenteric nodes worrisome for primary colon carcinoma in a necrotic and enlarged prostate with adenopathy with mass effect pressing on the bladder and the rectum.the issue at this point is whether or not this patient has 2 or possibly 3 primary cancers.  Past Medical History  Diagnosis Date  . Diabetes mellitus without complication   . Hypertension   . Hyperlipidemia   . GERD (gastroesophageal reflux disease)   . Vitamin D deficiency   . History of colon polyps   . Complication of anesthesia     Difficult to wake up; and severe nausea and vomiting  . PONV (postoperative nausea and vomiting)     Past Surgical History  Procedure Laterality Date  . Tonsillectomy    . Colonoscopy w/ polypectomy    . Esophagus surgery      Had esophagus stretched due to food getting trapped in his throat  . Craniotomy Bilateral 04/06/2014    Procedure: Bilateral Fronto-parietal craniotomy for resection of tumors with Curve;  Surgeon: Consuella Lose, MD;  Location: [facility];  Service: Neurosurgery;  Laterality: Bilateral;  Bilateral Fronto-parietal craniotomy for resection of tumors with Curve    Family History  Problem Relation Age of Onset  . Goiter Mother   . Hypertension Father   . Cancer  Father     colon    Social History:  reports that he has never smoked. He does not have any smokeless tobacco history on file. He reports that he does not drink alcohol or use illicit drugs.  Allergies:  Allergies  Allergen Reactions  . Atenolol Shortness Of Breath  . Lipitor [Atorvastatin]     Itch    Medications; Prior to Admission medications   Medication Sig Start Date End Date Taking? Authorizing Provider  Ascorbic Acid (VITAMIN C) 1000 MG tablet Take 1,000 mg by mouth daily.    Historical Provider, MD  aspirin EC 81 MG tablet Take 81 mg by mouth daily.    Historical Provider, MD  Cholecalciferol (VITAMIN D) 2000 UNITS CAPS Take 4,000 Units by mouth daily.     Historical Provider, MD  dexamethasone (DECADRON) 4 MG tablet Take 4 mg by mouth 2 (two) times daily.    Historical Provider, MD  enalapril (VASOTEC) 20 MG tablet Take 20 mg by mouth daily.    Historical Provider, MD  glimepiride (AMARYL) 4 MG tablet Take 2 mg by mouth 2 (two) times daily.    Historical Provider, MD  glimepiride (AMARYL) 4 MG tablet TAKE ONE-HALF TO ONE TABLET BY MOUTH TWICE DAILY WITH FOOD FOR DIABETES 03/27/14   Vicie Mutters, PA-C  metFORMIN (GLUCOPHAGE-XR) 500 MG 24 hr tablet Take 500 mg by mouth 3 (three) times daily. 500mg  in a.m., 500mg . At lunch, 1000mg . In the evening    Historical Provider, MD  Multiple  Vitamin (MULTIVITAMIN WITH MINERALS) TABS Take 1 tablet by mouth daily.    Historical Provider, MD  pravastatin (PRAVACHOL) 40 MG tablet Take 1 tablet (40 mg total) by mouth at bedtime. 02/08/14   Unk Pinto, MD   . calcium-vitamin D  1 tablet Oral BID  . dexamethasone  4 mg Oral Q12H  . enalapril  20 mg Oral Daily  . enoxaparin (LOVENOX) injection  40 mg Subcutaneous Q24H  . feeding supplement (PRO-STAT SUGAR FREE 64)  30 mL Oral TID WC  . glimepiride  2 mg Oral Q breakfast  . insulin aspart  0-20 Units Subcutaneous TID WC  . insulin detemir  10 Units Subcutaneous QHS  . levETIRAcetam  500  mg Oral BID  . levofloxacin  500 mg Oral Daily  . metFORMIN  1,000 mg Oral Q supper  . metFORMIN  500 mg Oral BID WC  . multivitamin with minerals  1 tablet Oral Daily  . pantoprazole  40 mg Oral QHS  . pravastatin  40 mg Oral q1800  . senna  2 tablet Oral BID   PRN Meds acetaminophen, ALPRAZolam, alum & mag hydroxide-simeth, bisacodyl, diphenhydrAMINE, guaiFENesin-dextromethorphan, HYDROcodone-acetaminophen, ondansetron **OR** ondansetron (ZOFRAN) IV, simethicone, sorbitol, traZODone Results for orders placed or performed during the hospital encounter of 04/11/14 (from the past 48 hour(s))  Glucose, capillary     Status: None   Collection Time: 04/16/14 11:42 AM  Result Value Ref Range   Glucose-Capillary 71 70 - 99 mg/dL   Comment 1 Notify RN   Glucose, capillary     Status: Abnormal   Collection Time: 04/16/14  4:56 PM  Result Value Ref Range   Glucose-Capillary 203 (H) 70 - 99 mg/dL   Comment 1 Notify RN   Glucose, capillary     Status: Abnormal   Collection Time: 04/16/14  8:59 PM  Result Value Ref Range   Glucose-Capillary 254 (H) 70 - 99 mg/dL   Comment 1 Notify RN   Glucose, capillary     Status: Abnormal   Collection Time: 04/17/14  6:49 AM  Result Value Ref Range   Glucose-Capillary 107 (H) 70 - 99 mg/dL  Glucose, capillary     Status: Abnormal   Collection Time: 04/17/14 11:33 AM  Result Value Ref Range   Glucose-Capillary 141 (H) 70 - 99 mg/dL  Glucose, capillary     Status: Abnormal   Collection Time: 04/17/14  4:43 PM  Result Value Ref Range   Glucose-Capillary 144 (H) 70 - 99 mg/dL  Glucose, capillary     Status: Abnormal   Collection Time: 04/17/14  8:32 PM  Result Value Ref Range   Glucose-Capillary 184 (H) 70 - 99 mg/dL   Comment 1 Notify RN   Glucose, capillary     Status: Abnormal   Collection Time: 04/18/14  6:24 AM  Result Value Ref Range   Glucose-Capillary 124 (H) 70 - 99 mg/dL   Comment 1 Notify RN     No results  found.             Blood pressure 109/66, pulse 68, temperature 97.9 F (36.6 C), temperature source Oral, resp. rate 18, height 6\' 4"  (1.93 m), weight 91.536 kg (201 lb 12.8 oz), SpO2 98 %.  Physical exam:   General--patient ambulating in the room grossly normal  Heart--regular rate and rhythm without murmurs are gallops Lungs--clear Abdomen--soft and nontender -   Assessment: 1. Cecal mass. Patient normal colonoscopy 3 years ago. This could be a benign polyp or  a: tumor. Would be quite unusual for small cell tumor of the colon. Agree colonoscopy to evaluate would be appropriate 2. Brain cancer. Path shows meningioma grade 1 and metastatic small cell. CT scan does not show any small cell lung. At this point primary still somewhat unclear.  Plan: 1. We will go ahead and arrange colonoscopy tomorrow to evaluate the area in the cecum. Unlikely that we will attempt to remove it but will go ahead and do a biopsy. Have discussed this in detail with the patient and he is agreeable.   Ivan Stark,Ivan Stark 04/18/2014, 11:28 AM

## 2014-04-18 NOTE — Progress Notes (Signed)
[facility]IC[facility] RE[facility]ILITATION     PROGRESS NOTE    Subjective/Complaints: No new problems. Up with therapy already. ?mild constipation. Somewhat emotional at times with therapy.   Objective: Vital Signs: Blood pressure 109/66, pulse 68, temperature 97.9 F (36.6 C), temperature source Oral, resp. rate 18, height 6\' 4"  (1.93 m), weight 91.536 kg (201 lb 12.8 oz), SpO2 98 %. No results found. No results for input(s): WBC, HGB, HCT, PLT in the last 72 hours. No results for input(s): NA, K, CL, GLUCOSE, BUN, CREATININE, CALCIUM in the last 72 hours.  Invalid input(s): CO CBG (last 3)   Recent Labs  04/17/14 1643 04/17/14 2032 04/18/14 0624  GLUCAP 144* 184* 124*    Wt Readings from Last 3 Encounters:  04/11/14 91.536 kg (201 lb 12.8 oz)  04/06/14 94.8 kg (208 lb 15.9 oz)  03/29/14 95.981 kg (211 lb 9.6 oz)    Physical Exam:  Constitutional: He is oriented to person, place, and time. He appears well-developed.  HENT: oral mucosa pink and moist Craniotomy site with staples intact. Clean. Old blood around incision Eyes: EOM are normal.  Neck: Normal range of motion. Neck supple. No thyromegaly present.  Cardiovascular: Normal rate and regular rhythm.  Respiratory: Effort normal and breath sounds normal. No respiratory distress.  GI: Soft. Bowel sounds are normal. He exhibits no distension.  Neurological: He is alert and oriented to person, place, and time.  Follows commands. Good insight and awareness. Memory intact. Language normal.no CN deficits.  Right upper extremity 5/5 deltoid, biceps, triceps, grip, left upper extremity 4+/5 in the deltoid, bicep, tricep, grip. 4/5 right hip flexor knee extensor and ankle dorsiflexor plantar flexor. Left lower extremity Trace to 1/5 hip flexor knee extensor, tr to 1/5 ankle dorsiflexor plantar flexor Sensation sl diminished to LT left arm and leg. Mood and affect are appropriate No left neglect. No abnormal  tone.  Skin: Skin is warm and dry.  Psych: mood remains pleasant and up beat  Assessment/Plan: 1. Functional deficits secondary to right frontal meningioma, metastatic lung cancer which require 3+ hours per day of interdisciplinary therapy in a comprehensive inpatient rehab setting. Physiatrist is providing close team supervision and 24 hour management of active medical problems listed below. Physiatrist and rehab team continue to assess barriers to discharge/monitor patient progress toward functional and medical goals.  Have requested a left AFO per [employer]  FIM: FIM - Bathing Bathing Steps Patient Completed: Chest, Right Arm, Left Arm, Abdomen, Front perineal area, Buttocks, Left lower leg (including foot), Right lower leg (including foot), Left upper leg, Right upper leg Bathing: 4: Steadying assist  FIM - Upper Body Dressing/Undressing Upper body dressing/undressing steps patient completed: Thread/unthread right sleeve of pullover shirt/dresss, Thread/unthread left sleeve of pullover shirt/dress, Put head through opening of pull over shirt/dress, Pull shirt over trunk Upper body dressing/undressing: 5: Set-up assist to: Obtain clothing/put away FIM - Lower Body Dressing/Undressing Lower body dressing/undressing steps patient completed: Thread/unthread right underwear leg, Thread/unthread left underwear leg, Pull underwear up/down, Thread/unthread right pants leg, Thread/unthread left pants leg, Pull pants up/down, Don/Doff left sock, Don/Doff right sock Lower body dressing/undressing: 4: Min-Patient completed 75 plus % of tasks  FIM - Toileting Toileting steps completed by patient: Performs perineal hygiene Toileting Assistive Devices: Grab bar or rail for support Toileting: 2: Max-Patient completed 1 of 3 steps  FIM - Radio producer Devices: Elevated toilet seat, Grab bars Toilet Transfers: 4-To toilet/BSC: Min A (steadying Pt. > 75%), 4-From  toilet/BSC: Min A (steadying Pt. > 75%)  FIM - Bed/Chair Transfer Bed/Chair Transfer Assistive Devices: Arm rests, Bed rails Bed/Chair Transfer: 4: Supine > Sit: Min A (steadying Pt. > 75%/lift 1 leg), 4: Sit > Supine: Min A (steadying pt. > 75%/lift 1 leg), 4: Bed > Chair or W/C: Min A (steadying Pt. > 75%), 4: Chair or W/C > Bed: Min A (steadying Pt. > 75%)  FIM - Locomotion: Wheelchair Distance: 150 Locomotion: Wheelchair: 5: Travels 150 ft or more: maneuvers on rugs and over door sills with supervision, cueing or coaxing FIM - Locomotion: Ambulation Locomotion: Ambulation Assistive Devices: Administrator Ambulation/Gait Assistance: 4: Min assist, 3: Mod assist Locomotion: Ambulation: 1: Travels less than 50 ft with moderate assistance (Pt: 50 - 74%)  Comprehension Comprehension Mode: Auditory Comprehension: 6-Follows complex conversation/direction: With extra time/assistive device  Expression Expression Mode: Verbal Expression: 6-Expresses complex ideas: With extra time/assistive device  Social Interaction Social Interaction: 6-Interacts appropriately with others with medication or extra time (anti-anxiety, antidepressant).  Problem Solving Problem Solving: 5-Solves complex 90% of the time/cues < 10% of the time  Memory Memory Mode: Asleep Memory: 6-More than reasonable amt of time   Medical Problem List and Plan: 1. Functional deficits secondary to Right frontal meningioma status post resection with left hemiparesis 2. DVT Prophylaxis/Anticoagulation: Pharmaceutical: Lovenox--cleared to initiate per Dr. Kathyrn Sheriff 3. Pain Management: Will continue hydrocodone prn.  4. Mood: Reports anxiety/restlessness due to steroids.   low dose xanax prn. LCSW to follow for evaluation and support.  5. Neuropsych: This patient is capable of making decisions on his own behalf. 6. Skin/Wound Care: Routine pressure relief measures.  7. Fluids/Electrolytes/Nutrition: Monitor intake.  Offer supplements as needed to maintain adequate intake.  8. DM type 2: Monitor BS ac/hs and continue Levemir for now. ON amaryl PTA---currently on metformin bid additionally.   resume amaryl at 2mg  daily today 9. Seizure prophylaxis: Continue Keppra bid.  10. HTN: Continue vasotec daily. Monitor BP every 8 hours and adjust as needed.   12 Constipation: moved bowels 11/9---sorbitol if no bm today 13. Oncology: necrotic prostrate on CT-prostate bx'ed  by urology  -GI consult for lesion in cecum   -dr. Benay Spice following  -rad/onc following    LOS (Days) 7 A FACE TO FACE EVALUATION WAS PERFORMED  Jearlean Demauro T 04/18/2014 7:30 AM

## 2014-04-18 NOTE — Progress Notes (Signed)
Physical Therapy Session Note  Patient Details  Name: Ivan Stark MRN: [id number] Date of Birth: [birth year]  Today's Date: 04/18/2014 PT Individual Time: 0805-0905 PT Individual Time Calculation (min): 60 min   Short Term Goals: Week 1:  PT Short Term Goal 1 (Week 1): Pt will perform bed mobility with min A.  PT Short Term Goal 2 (Week 1): Pt will transfer bed <> w/c wtih min A.  PT Short Term Goal 3 (Week 1): Pt will ambulate 25 ft with mod A.  PT Short Term Goal 4 (Week 1): Pt will negotiate 3 stairs with 2 rails and mod A.   Skilled Therapeutic Interventions/Progress Updates:  1:1. Pt received sitting in w/c, finishing grooming at sink. Denies pain. Focus this session on functional endurance during w/c propulsion, gait training and NMR during stair negoation. Pt req distant supervision for w/c propulsion 175'x2 with B UE, demonstrating slow but steady pace. Pt req min A for all t/f sit<>stand and SPT with RW, emphasis on quality of step and awareness of L LE during transfers.   Pt amb 37'x2 with RW and min-mod A with verbal cues and manual facilitation of L LE for advancement to achieve consistent reciprocal pattern, foot clearance, quad control during stance phase and B hip ext. Pt utilized L posterior [place] AFO for increased stabilization and DF, anticipate that pt would benefit more from anterior AFO as well as heel wedge due to progressive knee instability with fatigue.   Pt practiced placing L LE on/off 4" step progressing to 6" step to facilitate increased simultaneous hip/knee flexion with mod A. Pt progressed to negotiation up 3/down 2 steps x2 with B rail, min-mod for L foot clearance as well as stability. Overall mod cues for seq, safety and technique.   Pt left sitting in w/c at end of session w/ all needs in reach.     Therapy Documentation Precautions:  Precautions Precautions: Fall Precaution Comments: LLE hemiplegia Restrictions Weight Bearing Restrictions:  No General:   Vital Signs: Therapy Vitals Temp: 97.9 F (36.6 C) Temp Source: Oral Pulse Rate: 68 Resp: 18 BP: 109/66 mmHg Patient Position (if appropriate): Lying Oxygen Therapy SpO2: 98 % O2 Device: Not Delivered Pain: Pain Assessment Pain Assessment: No/denies pain  See FIM for current functional status  Therapy/Group: Individual Therapy  Gilmore Laroche 04/18/2014, 9:08 AM

## 2014-04-19 ENCOUNTER — Encounter (HOSPITAL_COMMUNITY): Payer: Self-pay

## 2014-04-19 ENCOUNTER — Inpatient Hospital Stay (HOSPITAL_COMMUNITY): Payer: Medicare Other | Admitting: *Deleted

## 2014-04-19 ENCOUNTER — Encounter (HOSPITAL_COMMUNITY)
Admission: RE | Disposition: A | Discharge status: Home Health | Payer: Self-pay | Source: Intra-hospital | Attending: Physical Medicine & Rehabilitation

## 2014-04-19 ENCOUNTER — Encounter (HOSPITAL_COMMUNITY): Payer: Medicare Other

## 2014-04-19 ENCOUNTER — Inpatient Hospital Stay (HOSPITAL_COMMUNITY): Payer: Medicare Other

## 2014-04-19 HISTORY — PX: COLONOSCOPY: SHX5424

## 2014-04-19 LAB — GLUCOSE, CAPILLARY
Glucose-Capillary: 123 mg/dL — ABNORMAL HIGH (ref 70–99)
Glucose-Capillary: 124 mg/dL — ABNORMAL HIGH (ref 70–99)
Glucose-Capillary: 291 mg/dL — ABNORMAL HIGH (ref 70–99)
Glucose-Capillary: 197 mg/dL — ABNORMAL HIGH (ref 70–99)

## 2014-04-19 SURGERY — COLONOSCOPY
Anesthesia: Moderate Sedation

## 2014-04-19 MED ORDER — PRAVASTATIN SODIUM 40 MG PO TABS: 40.0000 mg | ORAL_TABLET | Freq: Every day | ORAL | Status: DC

## 2014-04-19 MED ORDER — MIDAZOLAM HCL 5 MG/5ML IJ SOLN
INTRAMUSCULAR | Status: DC | PRN
  Administered 2014-04-19: 2 mg via INTRAVENOUS
  Administered 2014-04-19 (×2): 1 mg via INTRAVENOUS

## 2014-04-19 MED ORDER — ASPIRIN EC 81 MG PO TBEC
81.0000 mg | DELAYED_RELEASE_TABLET | Freq: Every day | ORAL | Status: DC
  Administered 2014-04-19 – 2014-04-28 (×9): 81 mg via ORAL
  Filled 2014-04-19 (×11): qty 1

## 2014-04-19 MED ORDER — SODIUM CHLORIDE 0.9 % IV SOLN
INTRAVENOUS | Status: DC
  Administered 2014-04-19: 500 mL via INTRAVENOUS

## 2014-04-19 MED ORDER — ADULT MULTIVITAMIN W/MINERALS CH: 1.0000 | ORAL_TABLET | Freq: Every day | ORAL | Status: DC

## 2014-04-19 MED ORDER — GLIMEPIRIDE 2 MG PO TABS: 2.0000 mg | ORAL_TABLET | Freq: Two times a day (BID) | ORAL | Status: DC

## 2014-04-19 MED ORDER — ENALAPRIL MALEATE 20 MG PO TABS: 20.0000 mg | ORAL_TABLET | Freq: Every day | ORAL | Status: DC

## 2014-04-19 MED ORDER — MIDAZOLAM HCL 5 MG/ML IJ SOLN
INTRAMUSCULAR | Status: AC
Start: 2014-04-19 — End: 2014-04-19
  Filled 2014-04-19: qty 2

## 2014-04-19 MED ORDER — DEXAMETHASONE 4 MG PO TABS: 4.0000 mg | ORAL_TABLET | Freq: Two times a day (BID) | ORAL | Status: DC

## 2014-04-19 MED ORDER — DIPHENHYDRAMINE HCL 50 MG/ML IJ SOLN
INTRAMUSCULAR | Status: AC
  Filled 2014-04-19: qty 1

## 2014-04-19 MED ORDER — ONDANSETRON HCL 4 MG/2ML IJ SOLN
INTRAMUSCULAR | Status: AC
  Filled 2014-04-19: qty 2

## 2014-04-19 MED ORDER — METFORMIN HCL ER 500 MG PO TB24: 500.0000 mg | ORAL_TABLET | Freq: Three times a day (TID) | ORAL | Status: DC

## 2014-04-19 MED ORDER — SODIUM CHLORIDE 0.9 % IV BOLUS (SEPSIS)
500.0000 mL | Freq: Once | INTRAVENOUS | Status: AC
  Administered 2014-04-19: 500 mL via INTRAVENOUS

## 2014-04-19 MED ORDER — POLYETHYLENE GLYCOL 3350 17 G PO PACK
17.0000 g | PACK | Freq: Two times a day (BID) | ORAL | Status: DC
  Administered 2014-04-20 – 2014-04-28 (×12): 17 g via ORAL
  Filled 2014-04-19 (×20): qty 1

## 2014-04-19 MED ORDER — FENTANYL CITRATE 0.05 MG/ML IJ SOLN
INTRAMUSCULAR | Status: AC
  Filled 2014-04-19: qty 2

## 2014-04-19 MED ORDER — FENTANYL CITRATE 0.05 MG/ML IJ SOLN
INTRAMUSCULAR | Status: DC | PRN
  Administered 2014-04-19 (×2): 25 ug via INTRAVENOUS

## 2014-04-19 NOTE — Progress Notes (Signed)
Occupational Therapy Session Note  Patient Details  Name: Ivan Stark MRN: [id number] Date of Birth: [birth year]  Today's Date: 04/19/2014 OT Individual Time: 0800-0900 OT Individual Time Calculation (min): 60 min    Short Term Goals: Week 1:  OT Short Term Goal 1 (Week 1): Bath: performed in sit and stand with steady assist and use of LH sponge PRN. OT Short Term Goal 2 (Week 1): LB dressing: min assist to include sit and stand and AE PRN OT Short Term Goal 3 (Week 1): Toilet transfer: Min assist  OT Short Term Goal 4 (Week 1): Toileting: Min assist OT Short Term Goal 5 (Week 1): Simple kitchen tasks: Supervision at a w/c level  Skilled Therapeutic Interventions/Progress Updates:    Pt resting in bed upon arrival.  Pt stated that he wanted to shower this morning even though he knew he was going down for colonoscopy.  Pt exhibited Lt hip and knee flexion while supine in bed.  Pt unable to activate same movement when seated in w/c.  Pt stated he didn't feel unsteady this morning as he had the previous morning.  Pt performed supine<>sit EOB with supervision and required steady A for squat pivot transfer to w/c.  Pt completed bathing tasks at shower level and donned underpants with sit<>stand from w/c at sink.  Pt preferred to don gown this morning secondary to pending procedure.  Pt requested to use toilet and completed toilet transfer and toileting tasks with steady A.  Focus on activity tolerance, bed mobility, sit<>stand, transfers, sitting balance, standing balance, and safety awareness.  Therapy Documentation Precautions:  Precautions Precautions: Fall Precaution Comments: LLE hemiplegia Restrictions Weight Bearing Restrictions: No   Pain: Pain Assessment Pain Assessment: No/denies pain  See FIM for current functional status  Therapy/Group: Individual Therapy  Leroy Libman 04/19/2014, 9:12 AM

## 2014-04-19 NOTE — Plan of Care (Signed)
Problem: RH BOWEL ELIMINATION Goal: RH STG MANAGE BOWEL WITH ASSISTANCE STG Manage Bowel with min Assistance.  Outcome: Progressing Goal: RH STG MANAGE BOWEL W/MEDICATION W/ASSISTANCE STG Manage Bowel with Medication with min. Assistance.  Outcome: Progressing  Problem: RH SKIN INTEGRITY Goal: RH STG SKIN FREE OF INFECTION/BREAKDOWN With min. assisst  Outcome: Progressing Goal: RH STG MAINTAIN SKIN INTEGRITY WITH ASSISTANCE STG Maintain Skin Integrity With min.Assistance.  Outcome: Progressing Goal: RH OTHER STG SKIN INTEGRITY GOALS W/ASSIST Other STG Skin Integrity Goals With Assistance.  Outcome: Progressing  Problem: RH SAFETY Goal: RH STG ADHERE TO SAFETY PRECAUTIONS W/ASSISTANCE/DEVICE STG Adhere to Safety Precautions With mod. Assistance/Device.  Outcome: Progressing Goal: RH STG DECREASED RISK OF FALL WITH ASSISTANCE STG Decreased Risk of Fall With Assistance.  Outcome: Progressing

## 2014-04-19 NOTE — Progress Notes (Addendum)
[facility] PHYSICAL MEDICINE & REHABILITATION     PROGRESS NOTE    Subjective/Complaints: Moving bowels a lot with prep. Still feeling well. In good spirits  Objective: Vital Signs: Blood pressure 132/69, pulse 67, temperature 98.1 F (36.7 C), temperature source Oral, resp. rate 20, height 6\' 4"  (1.93 m), weight 88.996 kg (196 lb 3.2 oz), SpO2 100 %. No results found. No results for input(s): WBC, HGB, HCT, PLT in the last 72 hours. No results for input(s): NA, K, CL, GLUCOSE, BUN, CREATININE, CALCIUM in the last 72 hours.  Invalid input(s): CO CBG (last 3)   Recent Labs  04/18/14 1637 04/18/14 2046 04/19/14 0613  GLUCAP 136* 103* 123*    Wt Readings from Last 3 Encounters:  04/18/14 88.996 kg (196 lb 3.2 oz)  04/06/14 94.8 kg (208 lb 15.9 oz)  03/29/14 95.981 kg (211 lb 9.6 oz)    Physical Exam:  Constitutional: He is oriented to person, place, and time. He appears well-developed.  HENT: oral mucosa pink and moist Craniotomy site with staples intact. Clean. Old blood around incision Eyes: EOM are normal.  Neck: Normal range of motion. Neck supple. No thyromegaly present.  Cardiovascular: Normal rate and regular rhythm.  Respiratory: Effort normal and breath sounds normal. No respiratory distress.  GI: Soft. Bowel sounds are normal. He exhibits no distension.  Neurological: He is alert and oriented to person, place, and time.  Follows commands. Good insight and awareness. Memory intact. Language normal. no CN deficits.  Right upper extremity 5/5 deltoid, biceps, triceps, grip, left upper extremity 4+/5 in the deltoid, bicep, tricep, grip. 4/5 right hip flexor knee extensor and ankle dorsiflexor plantar flexor. Left lower extremity Trace to 1/5 hip flexor knee extensor, 2/5 HF.  tr   ankle dorsiflexor plantar flexor Sensation sl diminished to LT left arm and leg. Mood and affect are appropriate No left neglect. No abnormal tone.  Skin: Skin is warm and dry.   Psych: mood remains pleasant and up beat  Assessment/Plan: 1. Functional deficits secondary to right frontal meningioma, metastatic lung cancer which require 3+ hours per day of interdisciplinary therapy in a comprehensive inpatient rehab setting. Physiatrist is providing close team supervision and 24 hour management of active medical problems listed below. Physiatrist and rehab team continue to assess barriers to discharge/monitor patient progress toward functional and medical goals.   left AFO per [employer]  FIM: FIM - Bathing Bathing Steps Patient Completed: Chest, Right Arm, Left Arm, Abdomen, Front perineal area, Buttocks, Left lower leg (including foot), Right lower leg (including foot), Left upper leg, Right upper leg Bathing: 4: Steadying assist  FIM - Upper Body Dressing/Undressing Upper body dressing/undressing steps patient completed: Thread/unthread right sleeve of pullover shirt/dresss, Thread/unthread left sleeve of pullover shirt/dress, Put head through opening of pull over shirt/dress, Pull shirt over trunk Upper body dressing/undressing: 5: Set-up assist to: Obtain clothing/put away FIM - Lower Body Dressing/Undressing Lower body dressing/undressing steps patient completed: Thread/unthread right underwear leg, Thread/unthread left underwear leg, Pull underwear up/down, Thread/unthread right pants leg, Thread/unthread left pants leg, Pull pants up/down, Don/Doff left sock, Don/Doff right sock Lower body dressing/undressing: 4: Min-Patient completed 75 plus % of tasks  FIM - Toileting Toileting steps completed by patient: Adjust clothing prior to toileting, Performs perineal hygiene, Adjust clothing after toileting Toileting Assistive Devices: Grab bar or rail for support Toileting: 4: Steadying assist  FIM - Radio producer Devices: Elevated toilet seat, Grab bars Toilet Transfers: 4-To toilet/BSC: Min A (steadying Pt. >  75%), 4-From toilet/BSC:  Min A (steadying Pt. > 75%)  FIM - Control and instrumentation engineer Devices: Arm rests, Copy: 4: Bed > Chair or W/C: Min A (steadying Pt. > 75%), 4: Chair or W/C > Bed: Min A (steadying Pt. > 75%)  FIM - Locomotion: Wheelchair Distance: 150 Locomotion: Wheelchair: 5: Travels 150 ft or more: maneuvers on rugs and over door sills with supervision, cueing or coaxing FIM - Locomotion: Ambulation Locomotion: Ambulation Assistive Devices: Administrator Ambulation/Gait Assistance: 4: Min assist, 3: Mod assist Locomotion: Ambulation: 1: Travels less than 50 ft with moderate assistance (Pt: 50 - 74%)  Comprehension Comprehension Mode: Auditory Comprehension: 7-Follows complex conversation/direction: With no assist  Expression Expression Mode: Verbal Expression: 7-Expresses complex ideas: With no assist  Social Interaction Social Interaction: 7-Interacts appropriately with others - No medications needed.  Problem Solving Problem Solving: 5-Solves complex 90% of the time/cues < 10% of the time  Memory Memory Mode: Asleep Memory: 6-More than reasonable amt of time   Medical Problem List and Plan: 1. Functional deficits secondary to Right frontal meningioma status post resection with left hemiparesis 2. DVT Prophylaxis/Anticoagulation: Pharmaceutical: Lovenox--cleared to initiate per Dr. Kathyrn Sheriff 3. Pain Management: Will continue hydrocodone prn.  4. Mood: Reports anxiety/restlessness due to steroids.   low dose xanax prn. LCSW to follow for evaluation and support.  5. Neuropsych: This patient is capable of making decisions on his own behalf. 6. Skin/Wound Care: Routine pressure relief measures.  7. Fluids/Electrolytes/Nutrition: Monitor intake. Offer supplements as needed to maintain adequate intake.  8. DM type 2: Monitor BS ac/hs and continue Levemir for now. ON amaryl PTA---currently on metformin bid additionally.   resumed amaryl at 2mg   daily today--sugars improved 9. Seizure prophylaxis: Continue Keppra bid.  10. HTN: Continue vasotec daily. Monitor BP every 8 hours and adjust as needed.   12 Constipation: moved bowels 11/9---sorbitol if no bm today 13. Oncology: necrotic prostrate on CT-prostate bx'ed  by urology  -colonoscopy for cecum lesion today   -dr. Benay Spice following  -rad/onc---marking today   LOS (Days) 8 A FACE TO FACE EVALUATION WAS PERFORMED  SWARTZ,ZACHARY T 04/19/2014 7:29 AM

## 2014-04-19 NOTE — H&P (View-Only) (Signed)
[facility] Reason for consult:Abnormal CT Scan Referring Physician:  Dr Naaman Plummer. PCP: Dr Melford Aase. Primary G.I.: Dr. Cyndia Diver is an 68 y.o. male.  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 He is doing well in rehab. He had colonoscopy by Dr. Amedeo Plenty 2012. This was negative other than hemorrhoids and diverticula. The pathology from the resection of his brain cancers showed metastatic small cell carcinoma as well as meningioma grade 1.as part of his workup CT scan of the chest abdomen and pelvis was obtain which showed no sign of lung cancer, polyps would mass in the cecum with pathologically enlarged mesenteric nodes worrisome for primary colon carcinoma in a necrotic and enlarged prostate with adenopathy with mass effect pressing on the bladder and the rectum.the issue at this point is whether or not this patient has 2 or possibly 3 primary cancers.  Past Medical History  Diagnosis Date  . Diabetes mellitus without complication   . Hypertension   . Hyperlipidemia   . GERD (gastroesophageal reflux disease)   . Vitamin D deficiency   . History of colon polyps   . Complication of anesthesia     Difficult to wake up; and severe nausea and vomiting  . PONV (postoperative nausea and vomiting)     Past Surgical History  Procedure Laterality Date  . Tonsillectomy    . Colonoscopy w/ polypectomy    . Esophagus surgery      Had esophagus stretched due to food getting trapped in his throat  . Craniotomy Bilateral 04/06/2014    Procedure: Bilateral Fronto-parietal craniotomy for resection of tumors with Curve;  Surgeon: Consuella Lose, MD;  Location: [facility];  Service: Neurosurgery;  Laterality: Bilateral;  Bilateral Fronto-parietal craniotomy for resection of tumors with Curve    Family History  Problem Relation Age of Onset  . Goiter Mother   . Hypertension Father   . Cancer  Father     colon    Social History:  reports that he has never smoked. He does not have any smokeless tobacco history on file. He reports that he does not drink alcohol or use illicit drugs.  Allergies:  Allergies  Allergen Reactions  . Atenolol Shortness Of Breath  . Lipitor [Atorvastatin]     Itch    Medications; Prior to Admission medications   Medication Sig Start Date End Date Taking? Authorizing Provider  Ascorbic Acid (VITAMIN C) 1000 MG tablet Take 1,000 mg by mouth daily.    Historical Provider, MD  aspirin EC 81 MG tablet Take 81 mg by mouth daily.    Historical Provider, MD  Cholecalciferol (VITAMIN D) 2000 UNITS CAPS Take 4,000 Units by mouth daily.     Historical Provider, MD  dexamethasone (DECADRON) 4 MG tablet Take 4 mg by mouth 2 (two) times daily.    Historical Provider, MD  enalapril (VASOTEC) 20 MG tablet Take 20 mg by mouth daily.    Historical Provider, MD  glimepiride (AMARYL) 4 MG tablet Take 2 mg by mouth 2 (two) times daily.    Historical Provider, MD  glimepiride (AMARYL) 4 MG tablet TAKE ONE-HALF TO ONE TABLET BY MOUTH TWICE DAILY WITH FOOD FOR DIABETES 03/27/14   Vicie Mutters, PA-C  metFORMIN (GLUCOPHAGE-XR) 500 MG 24 hr tablet Take 500 mg by mouth 3 (three) times daily. 500mg  in a.m., 500mg . At lunch, 1000mg . In the evening    Historical Provider, MD  Multiple  Vitamin (MULTIVITAMIN WITH MINERALS) TABS Take 1 tablet by mouth daily.    Historical Provider, MD  pravastatin (PRAVACHOL) 40 MG tablet Take 1 tablet (40 mg total) by mouth at bedtime. 02/08/14   Unk Pinto, MD   . calcium-vitamin D  1 tablet Oral BID  . dexamethasone  4 mg Oral Q12H  . enalapril  20 mg Oral Daily  . enoxaparin (LOVENOX) injection  40 mg Subcutaneous Q24H  . feeding supplement (PRO-STAT SUGAR FREE 64)  30 mL Oral TID WC  . glimepiride  2 mg Oral Q breakfast  . insulin aspart  0-20 Units Subcutaneous TID WC  . insulin detemir  10 Units Subcutaneous QHS  . levETIRAcetam  500  mg Oral BID  . levofloxacin  500 mg Oral Daily  . metFORMIN  1,000 mg Oral Q supper  . metFORMIN  500 mg Oral BID WC  . multivitamin with minerals  1 tablet Oral Daily  . pantoprazole  40 mg Oral QHS  . pravastatin  40 mg Oral q1800  . senna  2 tablet Oral BID   PRN Meds acetaminophen, ALPRAZolam, alum & mag hydroxide-simeth, bisacodyl, diphenhydrAMINE, guaiFENesin-dextromethorphan, HYDROcodone-acetaminophen, ondansetron **OR** ondansetron (ZOFRAN) IV, simethicone, sorbitol, traZODone Results for orders placed or performed during the hospital encounter of 04/11/14 (from the past 48 hour(s))  Glucose, capillary     Status: None   Collection Time: 04/16/14 11:42 AM  Result Value Ref Range   Glucose-Capillary 71 70 - 99 mg/dL   Comment 1 Notify RN   Glucose, capillary     Status: Abnormal   Collection Time: 04/16/14  4:56 PM  Result Value Ref Range   Glucose-Capillary 203 (H) 70 - 99 mg/dL   Comment 1 Notify RN   Glucose, capillary     Status: Abnormal   Collection Time: 04/16/14  8:59 PM  Result Value Ref Range   Glucose-Capillary 254 (H) 70 - 99 mg/dL   Comment 1 Notify RN   Glucose, capillary     Status: Abnormal   Collection Time: 04/17/14  6:49 AM  Result Value Ref Range   Glucose-Capillary 107 (H) 70 - 99 mg/dL  Glucose, capillary     Status: Abnormal   Collection Time: 04/17/14 11:33 AM  Result Value Ref Range   Glucose-Capillary 141 (H) 70 - 99 mg/dL  Glucose, capillary     Status: Abnormal   Collection Time: 04/17/14  4:43 PM  Result Value Ref Range   Glucose-Capillary 144 (H) 70 - 99 mg/dL  Glucose, capillary     Status: Abnormal   Collection Time: 04/17/14  8:32 PM  Result Value Ref Range   Glucose-Capillary 184 (H) 70 - 99 mg/dL   Comment 1 Notify RN   Glucose, capillary     Status: Abnormal   Collection Time: 04/18/14  6:24 AM  Result Value Ref Range   Glucose-Capillary 124 (H) 70 - 99 mg/dL   Comment 1 Notify RN     No results  found.             Blood pressure 109/66, pulse 68, temperature 97.9 F (36.6 C), temperature source Oral, resp. rate 18, height 6\' 4"  (1.93 m), weight 91.536 kg (201 lb 12.8 oz), SpO2 98 %.  Physical exam:   General--patient ambulating in the room grossly normal  Heart--regular rate and rhythm without murmurs are gallops Lungs--clear Abdomen--soft and nontender -   Assessment: 1. Cecal mass. Patient normal colonoscopy 3 years ago. This could be a benign polyp or  a: tumor. Would be quite unusual for small cell tumor of the colon. Agree colonoscopy to evaluate would be appropriate 2. Brain cancer. Path shows meningioma grade 1 and metastatic small cell. CT scan does not show any small cell lung. At this point primary still somewhat unclear.  Plan: 1. We will go ahead and arrange colonoscopy tomorrow to evaluate the area in the cecum. Unlikely that we will attempt to remove it but will go ahead and do a biopsy. Have discussed this in detail with the patient and he is agreeable.   Teruo Stilley JR,Kyuss Hale L 04/18/2014, 11:28 AM

## 2014-04-19 NOTE — Progress Notes (Signed)
Physical Therapy Note  Patient Details  Name: Ivan Stark MRN: [id number] Date of Birth: 21-Jun-1945 Today's Date: 04/19/2014  Patient missed 60 minutes of skilled physical therapy this PM secondary to fatigue and low BP. IV administered to increase BP and patient requesting not to perform PT at this time. Patient and wife believe patient may have "overdone it" after colonoscopy this AM. Followed up with Gerald Stabs, orthotist, about scheduled PT times of 9:00 and 11:00 tomorrow to assess fit of AFO.   [place] Marry Kusch, PT, DPT 04/19/2014, 3:47 PM

## 2014-04-19 NOTE — Interval H&P Note (Signed)
History and Physical Interval Note:  04/19/2014 11:13 AM  Ivan Stark  has presented today for surgery, with the diagnosis of GI bleed  The various methods of treatment have been discussed with the patient and family. After consideration of risks, benefits and other options for treatment, the patient has consented to  Procedure(s): COLONOSCOPY (N/A) as a surgical intervention .  The patient's history has been reviewed, patient examined, no change in status, stable for surgery.  I have reviewed the patient's chart and labs.  Questions were answered to the patient's satisfaction.     Keajah Killough JR,Sayuri Rhames L

## 2014-04-19 NOTE — Progress Notes (Signed)
IP PROGRESS NOTE  Subjective:   He reports making progress with ambulation. His abdomen is bloated this morning after the colonoscopy prep.  Objective: Vital signs in last 24 hours: Blood pressure 132/69, pulse 67, temperature 98.1 F (36.7 C), temperature source Oral, resp. rate 20, height 6\' 4"  (1.93 m), weight 196 lb 3.2 oz (88.996 kg), SpO2 100 %.  Intake/Output from previous day: 11/11 0701 - 11/12 0700 In: 480 [P.O.:480] Out: 625 [Urine:625]  Physical Exam:not performed today  Lab Results:  PSA on 04/13/2014-4.21  Medications: I have reviewed the patient's current medications.  Assessment/Plan: 1. 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 Left frontal meningioma, WHO grade 1, status post resection 04/06/2014  3. Left leg weakness secondary to #1  4. 1 cm  brain lesion at the inferior falx noted on the MRI 03/21/2014, not resected  5.  Diabetes  6.  Hypertension  7.  Hyperlipidemia  8.  CT abdomen 04/13/2014 with a polypoid lesion in the cecum, enlarged ileocolic mesenteric lymph node,scheduled for a colonoscopy 04/19/2014  Mr. 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 This will be followed by systemic chemotherapy.  Recommendations: 1. Continue physical therapy per Dr. Naaman Plummer 2. radiation treatment planning per Dr. Tammi Klippel 3. please call oncology as needed. I will check on him during the week of 04/23/2014.  LOS: 8 days   [place]  04/19/2014, 8:15 AM

## 2014-04-19 NOTE — Op Note (Signed)
[facility] [place], [id number]   COLONOSCOPY PROCEDURE REPORT     EXAM DATE: 04/19/2014  PATIENT NAME:      Ivan Stark, Ivan Stark           MR #:      [id number]  BIRTHDATE:       [birth year]      VISIT #:     [id number]  ATTENDING:     Laurence Spates, MD     STATUS:     inpatient REFERRING MD:      Arturo Morton, M.D. ASA CLASS:        Class III  INDICATIONS:  The patient is a 68 yr old male here for a colonoscopy due to patient with metastatic small cell cancer to the brain probably emanating from small cell prostate cancer CT scan shows cecal mass. PROCEDURE PERFORMED:     Colonoscopy with biopsy MEDICATIONS:     Fentanyl 50 mcg IV and Versed 4 mg IV ESTIMATED BLOOD LOSS:     None  CONSENT: The patient understands the risks and benefits of the procedure and understands that these risks include, but are not limited to: sedation, allergic reaction, infection, perforation and/or bleeding. Alternative means of evaluation and treatment include, among others: physical exam, x-rays, and/or surgical intervention. The patient elects to proceed with this endoscopic procedure.  DESCRIPTION OF PROCEDURE: During intra-op preparation period all mechanical & medical equipment was checked for proper function. Hand hygiene and appropriate measures for infection prevention was taken. After the risks, benefits and alternatives of the procedure were thoroughly explained, Informed consent was verified, confirmed and timeout was successfully executed by the treatment team. A digital exam revealed a palpable rectal mass.      The [employer] Adult Colon [id number] endoscope was introduced through the anus and advanced to the cecum, which was identified by the ileocecal valve. No adverse events experienced. The prep was poor.. The instrument was then slowly withdrawn as the colon was fully examined.   COLON FINDINGS: A large mass was found at the cecum.  This  appeared to be between the appendix in the ileocecal valve and appeared to be extrinsic.  It was quite probable and erythematous.  Multiple biopsies were performed using cold forceps.   Small 1/2 cm polyp in the sigmoid colon was not removed. Retroflexion was not performed. The scope was then completely withdrawn from the patient and the procedure terminated. WITHDRAWAL TIME: 11 minutes 0 seconds    ADVERSE EVENTS:      There were no immediate complications.  IMPRESSIONS:     1.  Large mass was found at the cecum; this appeared to be between the appendix in the ileocecal valve and appeared to be extrinsic.  It was quite probable and erythematous.; multiple biopsies were performed using cold forceps This appeared to be extrinsic rather than a cecal mass. 2.  Small 1/2 cm polyp in the sigmoid colon was not removed 3.  Large extrinsic mass from prostate essentially filling the rectal lumen  RECOMMENDATIONS:     Will keep the patient on Miralax to avoid problems defecating.  Further treatment per oncology RECALL:  Laurence Spates, MD eSigned:  Laurence Spates, MD 04/19/2014 11:57 AM   cc:  Kavin Leech, MD  CPT CODES: ICD CODES:  The ICD and CPT codes recommended by this software are interpretations from the data that the clinical staff has captured with the software.  The verification of the translation  of this report to the ICD and CPT codes and modifiers is the sole responsibility of the health care institution and practicing physician where this report was generated.  [place] [place]. will not be held responsible for the validity of the ICD and CPT codes included on this report.  AMA assumes no liability for data contained or not contained herein. CPT is a Designer, television/film set of the [employer].   PATIENT NAME:  Ivan Stark, Ivan Stark MR#: [id number]

## 2014-04-19 NOTE — Progress Notes (Signed)
Occupational Therapy Note  Patient Details  Name: Ivan Stark MRN: [id number] Date of Birth: [birth year]  Today's Date: 04/19/2014 OT Individual Time: 1300-1400 OT Individual Time Calculation (min): 60 min   Pt denied pain Individual Therapy  Pt resting in bed upon arrival with wife present.  Pt had earlier returned from colonoscopy but was alert and eager to participate in therapy.  RN stated pt was cleared to participate.  Pt engaged in functional amb for home mgmt tasks.  Pt required min a with amb and min verbal cues for safety awareness. Pt returned to room for lunch at end of session.  Wife remained in room with patient.   Leotis Shames [facility] 04/19/2014, 2:05 PM

## 2014-04-19 NOTE — Progress Notes (Signed)
Occupational Therapy Weekly Progress Note  Patient Details  Name: Ivan Stark MRN: [id number] Date of Birth: [birth year]  Beginning of progress report period: April 12, 2014 End of progress report period: April 19, 2014     Patient has met 4 of 5 short term goals.  Pt has made steady progress with BADLs this week.  Pt requires min A/steady A for standing balance during toileting and when pulling up pants. Pt performs all functional transfers with min A but continues to require min verbal cues for correct LLE placement prior to transfers.  Pt requires min verbal cues for safety awareness with transitional movements.  Pt's wife has been present to observe therapy but has not actively participated in therapy.  Patient continues to demonstrate the following deficits: decreased activity tolerance, L sided weakness, safety awareness, decreased dynamic standing balance, decreased  Independence in self care and therefore will continue to benefit from skilled OT intervention to enhance overall performance with BADL and iADL.  Patient progressing toward long term goals..  Continue plan of care.  OT Short Term Goals Week 1:  OT Short Term Goal 1 (Week 1): Bath: performed in sit and stand with steady assist and use of LH sponge PRN. OT Short Term Goal 1 - Progress (Week 1): Met OT Short Term Goal 2 (Week 1): LB dressing: min assist to include sit and stand and AE PRN OT Short Term Goal 2 - Progress (Week 1): Met OT Short Term Goal 3 (Week 1): Toilet transfer: Min assist  OT Short Term Goal 3 - Progress (Week 1): Met OT Short Term Goal 4 (Week 1): Toileting: Min assist OT Short Term Goal 4 - Progress (Week 1): Met OT Short Term Goal 5 (Week 1): Simple kitchen tasks: Supervision at a w/c level OT Short Term Goal 5 - Progress (Week 1): Progressing toward goal Week 2:  OT Short Term Goal 1 (Week 2): Pt will perform LB dressing sit<>stand with supervision OT Short Term Goal 2 (Week 2): Pt will  perform toilet transfers with supervision OT Short Term Goal 3 (Week 2): Pt will perform toileting tasks (3/3) with supervision OT Short Term Goal 4 (Week 2): Pt will perform simple kitchen tasks at w/c level with supervision    Therapy Documentation Precautions:  Precautions Precautions: Fall Precaution Comments: LLE hemiplegia Restrictions Weight Bearing Restrictions: No  See FIM for current functional status    Leroy Libman 04/19/2014, 2:25 PM

## 2014-04-20 ENCOUNTER — Inpatient Hospital Stay (HOSPITAL_COMMUNITY): Payer: Medicare Other

## 2014-04-20 ENCOUNTER — Encounter (HOSPITAL_COMMUNITY): Payer: Self-pay | Admitting: Gastroenterology

## 2014-04-20 ENCOUNTER — Encounter (HOSPITAL_COMMUNITY): Payer: Medicare Other

## 2014-04-20 ENCOUNTER — Ambulatory Visit
Admit: 2014-04-20 | Discharge: 2014-04-20 | Disposition: A | Discharge status: Home / Self Care | Payer: Medicare Other | Source: Ambulatory Visit | Attending: Radiation Oncology | Admitting: Radiation Oncology

## 2014-04-20 DIAGNOSIS — C61 Malignant neoplasm of prostate: Secondary | ICD-10-CM | POA: Insufficient documentation

## 2014-04-20 DIAGNOSIS — Z510 Encounter for antineoplastic radiation therapy: Secondary | ICD-10-CM | POA: Insufficient documentation

## 2014-04-20 DIAGNOSIS — C801 Malignant (primary) neoplasm, unspecified: Secondary | ICD-10-CM

## 2014-04-20 LAB — GLUCOSE, CAPILLARY
GLUCOSE-CAPILLARY: 88 mg/dL (ref 70–99)
Glucose-Capillary: 116 mg/dL — ABNORMAL HIGH (ref 70–99)
Glucose-Capillary: 83 mg/dL (ref 70–99)
Glucose-Capillary: 119 mg/dL — ABNORMAL HIGH (ref 70–99)

## 2014-04-20 MED ORDER — GLIMEPIRIDE 2 MG PO TABS
2.0000 mg | ORAL_TABLET | Freq: Two times a day (BID) | ORAL | Status: DC
  Administered 2014-04-20 – 2014-04-28 (×15): 2 mg via ORAL
  Filled 2014-04-20 (×19): qty 1

## 2014-04-20 NOTE — Progress Notes (Signed)
[place] PHYSICAL MEDICINE & REHABILITATION     PROGRESS NOTE    Subjective/Complaints: Did remarkably well yesterday given all the activities. Has questions about handicap parking pass and dme  Objective: Vital Signs: Blood pressure 102/61, pulse 63, temperature 97.9 F (36.6 C), temperature source Oral, resp. rate 16, height 6\' 4"  (1.93 m), weight 88.996 kg (196 lb 3.2 oz), SpO2 97 %. No results found. No results for input(s): WBC, HGB, HCT, PLT in the last 72 hours. No results for input(s): NA, K, CL, GLUCOSE, BUN, CREATININE, CALCIUM in the last 72 hours.  Invalid input(s): CO CBG (last 3)   Recent Labs  04/19/14 1628 04/19/14 2118 04/20/14 0631  GLUCAP 291* 197* 116*    Wt Readings from Last 3 Encounters:  04/18/14 88.996 kg (196 lb 3.2 oz)  04/06/14 94.8 kg (208 lb 15.9 oz)  03/29/14 95.981 kg (211 lb 9.6 oz)    Physical Exam:  Constitutional: He is oriented to person, place, and time. He appears well-developed.  HENT: oral mucosa pink and moist Craniotomy site clean with scab. Old blood around incision Eyes: EOM are normal.  Neck: Normal range of motion. Neck supple. No thyromegaly present.  Cardiovascular: Normal rate and regular rhythm.  Respiratory: Effort normal and breath sounds normal. No respiratory distress.  GI: Soft. Bowel sounds are normal. He exhibits no distension.  Neurological: He is alert and oriented to person, place, and time.  Follows commands. Good insight and awareness. Memory intact. Language normal. no CN deficits.  Right upper extremity 5/5 deltoid, biceps, triceps, grip, left upper extremity 4+/5 in the deltoid, bicep, tricep, grip. 4/5 right hip flexor knee extensor and ankle dorsiflexor plantar flexor. Left lower extremity Trace to 1/5 hip flexor knee extensor, 2/5 HF.  tr   ankle dorsiflexor plantar flexor Sensation sl diminished to LT left arm and leg. Mood and affect are appropriate No left neglect. No abnormal tone.   Skin: Skin is warm and dry.  Psych: mood remains pleasant and up beat  Assessment/Plan: 1. Functional deficits secondary to right frontal meningioma, metastatic lung cancer which require 3+ hours per day of interdisciplinary therapy in a comprehensive inpatient rehab setting. Physiatrist is providing close team supervision and 24 hour management of active medical problems listed below. Physiatrist and rehab team continue to assess barriers to discharge/monitor patient progress toward functional and medical goals.   left AFO per [employer]. Filled out handicapped parking pass  FIM: FIM - Bathing Bathing Steps Patient Completed: Chest, Right Arm, Left Arm, Abdomen, Front perineal area, Buttocks, Left lower leg (including foot), Right lower leg (including foot), Left upper leg, Right upper leg Bathing: 4: Steadying assist  FIM - Upper Body Dressing/Undressing Upper body dressing/undressing steps patient completed: Thread/unthread right sleeve of pullover shirt/dresss, Thread/unthread left sleeve of pullover shirt/dress, Put head through opening of pull over shirt/dress, Pull shirt over trunk Upper body dressing/undressing: 5: Set-up assist to: Obtain clothing/put away FIM - Lower Body Dressing/Undressing Lower body dressing/undressing steps patient completed: Thread/unthread right underwear leg, Thread/unthread left underwear leg, Pull underwear up/down, Thread/unthread right pants leg, Thread/unthread left pants leg, Pull pants up/down, Don/Doff left sock, Don/Doff right sock Lower body dressing/undressing: 0: Wears gown/pajamas-no public clothing  FIM - Toileting Toileting steps completed by patient: Adjust clothing prior to toileting, Performs perineal hygiene, Adjust clothing after toileting Toileting Assistive Devices: Grab bar or rail for support Toileting: 4: Steadying assist  FIM - Radio producer Devices: Elevated toilet seat, Grab bars Toilet Transfers:  4-To toilet/BSC: Min A (steadying Pt. > 75%), 4-From toilet/BSC: Min A (steadying Pt. > 75%)  FIM - Control and instrumentation engineer Devices: Arm rests, Copy: 0: Activity did not occur  FIM - Locomotion: Wheelchair Distance: 150 Locomotion: Wheelchair: 0: Activity did not occur FIM - Locomotion: Ambulation Locomotion: Ambulation Assistive Devices: Administrator Ambulation/Gait Assistance: 4: Min assist, 3: Mod assist Locomotion: Ambulation: 0: Activity did not occur  Comprehension Comprehension Mode: Auditory Comprehension: 7-Follows complex conversation/direction: With no assist  Expression Expression Mode: Verbal Expression: 7-Expresses complex ideas: With no assist  Social Interaction Social Interaction: 7-Interacts appropriately with others - No medications needed.  Problem Solving Problem Solving: 6-Solves complex problems: With extra time  Memory Memory Mode: Asleep Memory: 6-More than reasonable amt of time   Medical Problem List and Plan: 1. Functional deficits secondary to Right frontal meningioma status post resection with left hemiparesis 2. DVT Prophylaxis/Anticoagulation: Pharmaceutical: Lovenox--cleared to initiate per Dr. Kathyrn Sheriff 3. Pain Management: Will continue hydrocodone prn.  4. Mood: Reports anxiety/restlessness due to steroids.   low dose xanax prn. LCSW to follow for evaluation and support.  5. Neuropsych: This patient is capable of making decisions on his own behalf. 6. Skin/Wound Care: Routine pressure relief measures.  7. Fluids/Electrolytes/Nutrition: Monitor intake. Offer supplements as needed to maintain adequate intake.  8. DM type 2: Monitor BS ac/hs and continue Levemir for now. ON amaryl PTA---currently on metformin bid additionally.   resumed amaryl at 2mg  daily today--sugars improved so far up until yesterday.   -increase amaryl to bid today 9. Seizure prophylaxis: Continue Keppra bid.  10.  HTN: Continue vasotec daily. Monitor BP every 8 hours and adjust as needed.   12 Constipation: moved bowels 11/9---sorbitol if no bm today 13. Oncology: necrotic prostrate on CT-prostate bx'ed  by urology  -colonoscopy for cecum lesion yesterday  -dr. Benay Spice following  -rad/onc---marking yesterday   LOS (Days) 9 A FACE TO FACE EVALUATION WAS PERFORMED  Ivan Stark T 04/20/2014 7:32 AM

## 2014-04-20 NOTE — Progress Notes (Signed)
Occupational Therapy Session Note  Patient Details  Name: Ivan Stark MRN: [id number] Date of Birth: 1945-10-31  Today's Date: 04/20/2014 OT Individual Time: 0800-0900 OT Individual Time Calculation (min): 60 min    Short Term Goals: Week 2:  OT Short Term Goal 1 (Week 2): Pt will perform LB dressing sit<>stand with supervision OT Short Term Goal 2 (Week 2): Pt will perform toilet transfers with supervision OT Short Term Goal 3 (Week 2): Pt will perform toileting tasks (3/3) with supervision OT Short Term Goal 4 (Week 2): Pt will perform simple kitchen tasks at w/c level with supervision  Skilled Therapeutic Interventions/Progress Updates:    Pt engaged in BADL retraining including bathing at shower level and dressing with sit<>stand from w/c at sink.  Pt continues to require min verbal cues for LLE placement in preparation for transitional movements and sit<>stand.  Pt performs squat pivot transfers with close supervision.  Pt requires steady A for standing balance to pull up pants.  Focus on activity tolerance, sit<>stand, transfers, w/c mobility, standing balance, and safety awareness.  Therapy Documentation Precautions:  Precautions Precautions: Fall Precaution Comments: LLE hemiplegia Restrictions Weight Bearing Restrictions: No Pain: Pain Assessment Pain Assessment: No/denies pain  See FIM for current functional status  Therapy/Group: Individual Therapy  Leroy Libman 04/20/2014, 9:00 AM

## 2014-04-20 NOTE — Progress Notes (Signed)
Physical Therapy Weekly Progress Note  Patient Details  Name: Ivan Stark MRN: [id number] Date of Birth: 1946-01-10  Beginning of progress report period: April 12, 2014 End of progress report period: April 20, 2014  Today's Date: 04/20/2014 PT Individual Time: Treatment Session 1: 0900-1000; Treatment Session 2:  PT Individual Time Calculation (min): Treatment Session 1: 60 min; Treatment Session 2:   Pt continues to be very motivated to participate in therapy as able, however, sometimes limited by fatigue due to medical procedures. Pt has achieved 4/4 STGs, see details below. Pt currently requires supervision for w/c propulsion, supervision-min guard A for lateral scoot transfers bed<>w/c, min guard-min A for bed mobility and t/f sit<>stand with RW, min A for ambulation up to 121' w/ RW as well as negotiation up/down 3 steps w/ B rail. Pt demonstrating progressive motor return in L LE up to knee, however, pt continues to require manual facilitation for advancement and knee control during standing mobility especially as he fatigues. Pt greatly benefits from use of anterior AFO+heel wedge and toe cap for increased safety, stability and DF assist during standing mobility.   Patient continues to demonstrate the following deficits: decreased functional endurance, decreased strength, decreased isolated motor control, decreased coordination, decreased balance, decreased proprioception, decreased overall transfers and functional mobility and therefore will continue to benefit from skilled PT intervention to enhance overall performance with activity tolerance, balance, postural control, ability to compensate for deficits, functional use of  left lower extremity and coordination.  Patient progressing toward long term goals..  Continue plan of care.  PT Short Term Goals Week 1:  PT Short Term Goal 1 (Week 1): Pt will perform bed mobility with min A.  PT Short Term Goal 1 - Progress (Week 1):  Met PT Short Term Goal 2 (Week 1): Pt will transfer bed <> w/c wtih min A.  PT Short Term Goal 2 - Progress (Week 1): Met PT Short Term Goal 3 (Week 1): Pt will ambulate 25 ft with mod A.  PT Short Term Goal 3 - Progress (Week 1): Met PT Short Term Goal 4 (Week 1): Pt will negotiate 3 stairs with 2 rails and mod A.  PT Short Term Goal 4 - Progress (Week 1): Met Week 2:  PT Short Term Goal 1 (Week 2): STGs=LTGs due to anticipated LOS  Skilled Therapeutic Interventions/Progress Updates:  Treatment Session 1:  1:1. Pt received sitting in w/c, ready for therapy. Focus this session on functional endurance, functional transfers, dynamic sitting balance and gait training. Pt req distant supervision for w/c propulsion 175'x1 with B UE room>therapy gym. Strong emphasis this session on emergent awareness of L LE during multiple t/f sit<>stand and SPT with RW, req min guard-min A overall.   Pt's dynamic sitting balance, specifically eccentric trunk control, challenged this session while sitting on bosu ball without B UE/LE support, reaching to all quadrants +/- BOS and tossing horseshoes at target. Pt with very limited range of postural control, req close(S)-min guard A overall.   Pt able to ambulate 121'x1 and 70'x1 with RW and min guard-min A. Emphasis on progressive advancement of RW, L knee stability during stance phase, L heel first contact and increased R LE step length for more fluid and even step lengths. Therapist providing increased manual facilitation as pt fatigued. Utilized posterior [place] AFO for increased safety, stability and DF assist.   Pt left sitting in recliner at end of session w/ all needs in reach.   Treatment Session 2:  Pt received sitting in recliner, ready for therapy. Focus this session on functional endurance, formal AFO fitting and standing NMR. Brooke Pace, Orthotist, from [facility] present for formal AFO assessment. Pt req min guard-min A for ambulation 70'x2 with RW.  Improved quality of ambulation noted with use of anterior Walk On [place] AFO + heel wedge, demonstrating decreased L knee flexion and extension during stance phase, more fluid and even step lengths as well as increased safety overall.   With use of blue bench for stability, pt practiced bringing L and R LE onto mat to achieve tall kneeling position to target simultaneous L hip/knee flexion and stability when bringing R LE up/down. Pt initially req mod A, decreasing to min guard A for safety with repetition.   Pt utilized NuStep to target strength and coordination in L LE with use of B LE only. Pt with good tolerance to level 1x65mn. With repetition, pt demonstrating improved control, quality and fluidity of movement in L LE.  Pt req distant supervision for w/c propusion 175'x1 back to room at end of session. Pt req min guard-min A for all t/f sit<>stand and SPT w/ RW as well as min A for t/f sit>sup at end of session. Pt left semi-reclined in bed with all needs in reach, eating lunch.   Therapy Documentation Precautions:  Precautions Precautions: Fall Precaution Comments: LLE hemiplegia Restrictions Weight Bearing Restrictions: No Pain: Pain Assessment Pain Assessment: No/denies pain  See FIM for current functional status  Therapy/Group: Individual Therapy  KGilmore Laroche11/13/2015, 10:02 AM

## 2014-04-20 NOTE — Progress Notes (Signed)
  Radiation Oncology         [phone] ________________________________  Name: Ivan Stark MRN: [id number]  Date: 04/20/2014  DOB: 08-May-1946  Inpatient  SIMULATION AND TREATMENT PLANNING NOTE    ICD-9-CM ICD-10-CM  1. 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  Identity was confirmed.  All relevant records and images related to the planned course of therapy were reviewed.  The patient freely provided informed written consent to proceed with treatment after reviewing the details related to the planned course of therapy. The consent form was witnessed and verified by the simulation staff.  Then, the patient was set-up in a stable reproducible  supine position for radiation therapy.  CT images were obtained.  Surface markings were placed.  The CT images were loaded into the planning software.  Then the target and avoidance structures were contoured.  Treatment planning then occurred.  The radiation prescription was entered and confirmed.  Then, I designed and supervised the construction of a total of 8 medically necessary complex treatment devices. These devices included a thermoplastic mask for head positioning during brain radiation as well as 2 multileaf collimator aperture's to shape radiation around the entire intracranial contents from the right and left gantry position while shielding the eyes and face. The devices also included a body fix immobilization device to immobilize the patient's pelvis for prostate radiotherapyand 4 multileaf collimator aperture as to shape radiation conformally around the prostate from anterior, posterior, right lateral, and left lateral gantry positions.. I have requested : Isodose Plan.    PLAN:  The patient will receive 35 Gy in 14 fractions to the brain and  prostate.  ________________________________  Sheral Apley. Tammi Klippel, M.D.

## 2014-04-20 NOTE — Progress Notes (Signed)
CRITICAL VALUE ALERT  Critical value received:  Positive urine culture E. Coli/ESBL  Date of notification:  04/20/14  Time of notification:  5465  Critical value read back:Yes.    Nurse who received alert:  Bernerd Limbo MD notified (1st page):  [place]  Time of first page:  0200 MD notified (2nd page):  Time of second page:  Responding MD: Silvestre Mesi  Time MD responded:  [id number]

## 2014-04-20 NOTE — Consult Note (Signed)
Radiation Oncology         [phone] ________________________________  Initial inpatient Consultation  Name: Ivan Stark MRN: [id number]  Date: 04/19/2014  DOB: 1945/06/15  EZ:MOQHUTM,LYYTKPT DAVID, MD  No ref. provider found   REFERRING PHYSICIAN: No ref. 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 Small cell carcinoma 199.1 C80.1   HISTORY OF PRESENT ILLNESS::Ivan Stark is a 68 y.o. male who presented withwith 4 month history of progressive gait instability and left leg weakness. He denied any headaches, vision changes, urinary or bowel incontinence. He had no confusion or seizures. No falls. He was referred to Neurosurgery as outpatient, after CT of the head on 02/08/14 revealed 3 intracranial lesions, including a heavily calcified small lesion at the bottom of the falx in the posterior frontal region.  MRI on 03/21/14 further delineates the above 3 lesions. The large left parasagittal lesion appeared to invade the superior sagittal sinus, and is homogeneously enhancing. There was no surrounding edema on the left. The right-sided lesion appeared to also be based on the falx, just above the corpus callosum. This was more heterogeneous in contrast enhancement, and did exhibit surrounding vasogenic edema. These intracranial tumors were consistent with meningioma. Of note, chest x ray was negative for acute findings. He underwent stereotactic bilateral frontoparietal craniotomy and resection of left frontal extra-axial tumor as well as right frontal extra-axial tumor 04/06/2014 per Dr. Kathyrn Sheriff. He was placed on Decadron protocol, and Keppra for seizure prophylaxis. Surgical pathology results of the right-sided tumor, case number [id number] was positive for metastatic small cell carcinoma; The malignant cells are positive for CD56, chromogranin, NSE, EMA, synaptophysin, and focally  positive for Cytokeratin AE1/AE3 and TTF-1. Overall, this profile is consistent with metastatic small cell carcinoma and the focal TTF-1 staining suggests a lung primary.  resection of the remaining 2 lesions, were positive for meningioma, Grade 1.   CT of the chest, abdomen and pelvis revealed no lung primary.  He had a large polypoid prostate mass.  Prostate TRUS and biopsy confirm small cell cell carcinoma.  He also had a polypoid cecal mass and regional mesenteric adenopathy adjacent to the cecum.    PREVIOUS RADIATION THERAPY: No  PAST MEDICAL HISTORY:  has a past medical history of Diabetes mellitus without complication; Hypertension; Hyperlipidemia; GERD (gastroesophageal reflux disease); Vitamin D deficiency; History of colon polyps; Complication of anesthesia; and PONV (postoperative nausea and vomiting).    PAST SURGICAL HISTORY: Past Surgical History  Procedure Laterality Date  . Tonsillectomy    . Colonoscopy w/ polypectomy    . Esophagus surgery      Had esophagus stretched due to food getting trapped in his throat  . Craniotomy Bilateral 04/06/2014    Procedure: Bilateral Fronto-parietal craniotomy for resection of tumors with Curve;  Surgeon: Consuella Lose, MD;  Location: [place] [facility];  Service: Neurosurgery;  Laterality: Bilateral;  Bilateral Fronto-parietal craniotomy for resection of tumors with Curve  . Colonoscopy N/A 04/19/2014    Procedure: COLONOSCOPY;  Surgeon: Winfield Cunas., MD;  Location: [facility];  Service: Endoscopy;  Laterality: N/A;    FAMILY HISTORY: family history includes Cancer in his father; Goiter in his mother; Hypertension in his father.  SOCIAL HISTORY:  reports that he has never smoked. He does not have any smokeless tobacco history on file. He reports that he does not drink alcohol or use illicit drugs.  ALLERGIES: Atenolol and Lipitor  MEDICATIONS:  Current Facility-Administered Medications  Medication Dose Route Frequency  Provider Last Rate Last Dose  . acetaminophen (TYLENOL) tablet 325-650 mg  325-650 mg Oral Q4H PRN Bary Leriche, PA-C      . ALPRAZolam Duanne Moron) tablet 0.5 mg  0.5 mg Oral BID PRN Bary Leriche, PA-C      . alum & mag hydroxide-simeth (MAALOX/MYLANTA) 200-200-20 MG/5ML suspension 30 mL  30 mL Oral Q4H PRN Bary Leriche, PA-C      . aspirin EC tablet 81 mg  81 mg Oral Daily Winfield Cunas., MD   81 mg at 04/19/14 1730  . bisacodyl (DULCOLAX) suppository 10 mg  10 mg Rectal Daily PRN Bary Leriche, PA-C      . calcium-vitamin D (OSCAL WITH D) 500-200 MG-UNIT per tablet 1 tablet  1 tablet Oral BID Bary Leriche, PA-C   1 tablet at 04/19/14 2100  . dexamethasone (DECADRON) tablet 4 mg  4 mg Oral Q12H Pamela S Love, PA-C   4 mg at 04/19/14 1732  . diphenhydrAMINE (BENADRYL) 12.5 MG/5ML elixir 12.5-25 mg  12.5-25 mg Oral Q6H PRN Bary Leriche, PA-C      . enalapril (VASOTEC) tablet 20 mg  20 mg Oral Daily Bary Leriche, PA-C   20 mg at 04/19/14 5329  . enoxaparin (LOVENOX) injection 40 mg  40 mg Subcutaneous Q24H Pamela S Love, PA-C   40 mg at 04/19/14 1732  . feeding supplement (PRO-STAT SUGAR FREE 64) liquid 30 mL  30 mL Oral TID WC Ivan Anchors Love, PA-C   30 mL at 04/19/14 1728  . glimepiride (AMARYL) tablet 2 mg  2 mg Oral Q breakfast Meredith Staggers, MD   2 mg at 04/18/14 9242  . guaiFENesin-dextromethorphan (ROBITUSSIN DM) 100-10 MG/5ML syrup 5-10 mL  5-10 mL Oral Q6H PRN Bary Leriche, PA-C      . HYDROcodone-acetaminophen (NORCO/VICODIN) 5-325 MG per tablet 1 tablet  1 tablet Oral Q4H PRN Bary Leriche, PA-C   1 tablet at 04/16/14 2345  . insulin aspart (novoLOG) injection 0-20 Units  0-20 Units Subcutaneous TID WC Bary Leriche, PA-C   11 Units at 04/19/14 1727  . insulin detemir (LEVEMIR) injection 10 Units  10 Units Subcutaneous QHS Marletta Lor, MD   10 Units at 04/19/14 2123  . levETIRAcetam (KEPPRA) tablet 500 mg  500 mg Oral BID Bary Leriche, PA-C   500 mg at 04/19/14 2100  .  metFORMIN (GLUCOPHAGE-XR) 24 hr tablet 1,000 mg  1,000 mg Oral Q supper Bary Leriche, PA-C   1,000 mg at 04/19/14 1730  . metFORMIN (GLUCOPHAGE-XR) 24 hr tablet 500 mg  500 mg Oral BID WC Ivan Anchors Love, PA-C   500 mg at 04/18/14 6834  . multivitamin with minerals tablet 1 tablet  1 tablet Oral Daily Bary Leriche, PA-C   1 tablet at 04/19/14 1962  . ondansetron (ZOFRAN) tablet 4 mg  4 mg Oral Q4H PRN Ivan Anchors Love, PA-C       Or  . ondansetron [facility]) injection 4 mg  4 mg Intravenous Q4H PRN Bary Leriche, PA-C   4 mg at 04/19/14 1109  . pantoprazole (PROTONIX) EC tablet 40 mg  40 mg Oral QHS Ivan Anchors Love, PA-C   40 mg at 04/19/14 2106  . polyethylene glycol (MIRALAX / GLYCOLAX) packet 17 g  17 g Oral BID Winfield Cunas., MD  17 g at 04/19/14 1400  . pravastatin (PRAVACHOL) tablet 40 mg  40 mg Oral q1800 Ivan Anchors Love, PA-C   40 mg at 04/19/14 1731  . senna (SENOKOT) tablet 17.2 mg  2 tablet Oral BID Bary Leriche, PA-C   17.2 mg at 04/18/14 2100  . simethicone (MYLICON) chewable tablet 80 mg  80 mg Oral QID PRN Ivan Anchors Love, PA-C      . sorbitol 70 % solution 30 mL  30 mL Oral Daily PRN Meredith Staggers, MD      . traZODone (DESYREL) tablet 25-50 mg  25-50 mg Oral QHS PRN Bary Leriche, PA-C   50 mg at 04/17/14 2244    REVIEW OF SYSTEMS:  A 15 point review of systems is documented in the electronic medical record. This was obtained by the nursing staff. However, I reviewed this with the patient to discuss relevant findings and make appropriate changes.  A comprehensive review of systems was negative.  Continued left leg weakness.   PHYSICAL EXAM:  height is 6\' 4"  (1.93 m) and weight is 196 lb 3.2 oz (88.996 kg). His oral temperature is 97.9 F (36.6 C). His blood pressure is 102/61 and his pulse is 63. His respiration is 16 and oxygen saturation is 97%.     Per Dr. Benay Spice, GENERAL:alert, no distress and comfortable SKIN: Well healed parietofrontal scar with staples present. skin  color, texture, turgor are normal, no rashes, but multiple nevi and keratotic areas. there is a sebaceous cyst in the left lower back region of about 2.5 cm. EYES: normal, conjunctiva are pink and non-injected, sclera clear. No ptosis.  OROPHARYNX:no exudate, no erythema and lips, buccal mucosa, and tongue normal  NECK: supple, thyroid normal size, non-tender, without nodularity LYMPH: no palpable lymphadenopathy in the cervical, supraclavicular or axillary or inguinal regions LUNGS: clear to auscultation and percussion with normal breathing effort HEART: regular rate & rhythm and no murmurs and no lower extremity edema ABDOMEN:abdomen soft, non-tender and normal bowel sounds. Left inguinal hernia present.  Musculoskeletal:no cyanosis of digits and no clubbing .  PSYCH: alert & oriented x 3 with fluent speech NEURO:Left lower extremity 1/5 strength. Sensation intact. rest of neuro exam unremarkable   KPS = 50  100 - Normal; no complaints; no evidence of disease. 90   - Able to carry on normal activity; minor signs or symptoms of disease. 80   - Normal activity with effort; some signs or symptoms of disease. 17   - Cares for self; unable to carry on normal activity or to do active work. 60   - Requires occasional assistance, but is able to care for most of his personal needs. 50   - Requires considerable assistance and frequent medical care. 24   - Disabled; requires special care and assistance. 71   - Severely disabled; hospital admission is indicated although death not imminent. 69   - Very sick; hospital admission necessary; active supportive treatment necessary. 10   - Moribund; fatal processes progressing rapidly. 0     - Dead  Karnofsky DA, Abelmann Ozan, Craver LS and Burchenal [facility] [phone]) The use of the nitrogen mustards in the palliative treatment of carcinoma: with particular reference to bronchogenic carcinoma Cancer 1 634-56  LABORATORY DATA:  Lab Results  Component Value  Date   WBC 10.3 04/12/2014   HGB 11.4* 04/12/2014   HCT 34.3* 04/12/2014   MCV 89.3 04/12/2014   PLT 306 04/12/2014   Lab Results  Component  Value Date   NA 139 04/14/2014   K 4.2 04/14/2014   CL 101 04/14/2014   CO2 26 04/14/2014   Lab Results  Component Value Date   ALT 12 04/12/2014   AST 13 04/12/2014   ALKPHOS 55 04/12/2014   BILITOT 0.4 04/12/2014     RADIOGRAPHY: Dg Chest 2 View  03/29/2014   CLINICAL DATA:  Preoperative evaluation for frontal parietal craniotomy  EXAM: CHEST  2 VIEW  COMPARISON:  07/31/2012  FINDINGS: Cardiac shadow is within normal limits. The thoracic aorta is tortuous. The lungs are well aerated bilaterally with mild interstitial changes. No acute infiltrate or effusion is noted. No acute bony abnormality is noted.  IMPRESSION: No active cardiopulmonary disease.   Electronically Signed   By: Inez Catalina M.D.   On: 03/29/2014 11:14   Ct Chest W Contrast  04/13/2014   CLINICAL DATA:  Small cell carcinoma, 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  EXAM: CT CHEST, ABDOMEN, AND PELVIS WITH CONTRAST  TECHNIQUE: Multidetector CT imaging of the chest, abdomen and pelvis was performed following the standard protocol during bolus administration of intravenous contrast.  CONTRAST:  152mL OMNIPAQUE IOHEXOL 300 MG/ML  SOLN  COMPARISON:  None.  FINDINGS: CT CHEST FINDINGS  Left lobe of the thyroid is asymmetrically enlarged and contains low-attenuation nodules measuring up to 1.6 cm. No pathologically enlarged mediastinal, hilar or axillary lymph nodes. Atherosclerotic calcification of the arterial vasculature, including three-vessel involvement of the coronary arteries. Heart size normal. No pericardial effusion. Small hiatal hernia also contains fluid.  Minimal biapical pleural parenchymal scarring. Probable subpleural atelectasis or scarring along the superior aspect of the right major fissure (image 22). Lungs are otherwise clear. No pleural fluid. Airway is unremarkable.  CT  ABDOMEN AND PELVIS FINDINGS  Hepatobiliary: Liver and gallbladder are unremarkable. No biliary ductal dilatation.  Pancreas: Negative.  Spleen: Negative.  Adrenals/Urinary Tract: Adrenal glands and kidneys are unremarkable. Ureters are decompressed. Prostate indents the bladder base.  Stomach/Bowel: Small hiatal hernia. Stomach, small bowel and appendix are unremarkable. A polypoid soft tissue lesion is seen in the cecum, measuring approximately 1.7 x 2.4 cm (axial image 97 and coronal image 82). Enlarged prostate exerts mass effect on the rectum. Colon is otherwise unremarkable.  Vascular/Lymphatic: Atherosclerotic calcification of the arterial vasculature without abdominal aortic aneurysm. Circumaortic left renal vein. Ileocolic mesenteric lymph node measures 1.2 x 2.1 cm (axial image 96). Lymph nodes are seen adjacent to the prostate, along the superior margin at the 7 o'clock position, measuring up to 11 mm in short axis (axial image 127). No additional pathologically enlarged lymph nodes.  Reproductive: Prostate is markedly enlarged and low in attenuation centrally, measuring approximately 7.5 x 8.1 cm.  Other: A heterogeneous subcutaneous nodule is seen in the inferior left flank, measuring 1.2 x 1.9 cm (axial image 87). Small bilateral inguinal hernias contain fat, left greater than right. Locules of air in the subcutaneous ventral abdominal wall are most likely iatrogenic. Mesenteries and peritoneum are otherwise unremarkable.  Musculoskeletal: A vague area of sclerosis in the left iliac wing (axial image 113) is nonspecific. Otherwise, no worrisome lytic or sclerotic lesions.  IMPRESSION: 1. No evidence of primary bronchogenic carcinoma. 2. Polypoid lesion in the cecum, with a pathologically enlarged ileocolic mesenteric lymph node, worrisome for primary colon carcinoma. 3. Markedly enlarged and necrotic appearing prostate with adjacent adenopathy. Associated mass effect on the bladder and rectum. 4.  Heterogeneous subcutaneous nodule in the inferior left flank, likely palpable on physical examination. Appearance is not  consistent with a sebaceous cyst. 5. Asymmetrically enlarged left thyroid with low-attenuation lesions. Consider further evaluation with thyroid ultrasound. If patient is clinically hyperthyroid, consider nuclear medicine thyroid uptake and scan. 6. Small hiatal hernia contains a small amount of fluid. 7. Three-vessel coronary artery calcification. 8. Small bilateral inguinal hernias contain fat.   Electronically Signed   By: Lorin Picket M.D.   On: 04/13/2014 08:30   [name] CH Contrast  04/07/2014   : CLINICAL DATA: Postop tumor resection 04/06/2014. Now with left leg weakness. Bilateral craniotomy for bilateral perirolandic meningioma resection.  EXAM: MRI HEAD WITHOUT AND WITH CONTRAST  TECHNIQUE: Multiplanar, multiecho pulse sequences of the brain and surrounding structures were obtained without and with intravenous contrast.  CONTRAST: 20 mL MultiHance IV  COMPARISON: MRI 03/21/2014  FINDINGS: Postop day 1 bilateral parietal craniotomy for bilateral meningioma resection.  Right parasagittal meningioma resection. Surgical cavity is filled with blood on the right. There is enhancement of the wall of the surgical cavity without masslike features. Although it is only been 24 hr, this enhancement pattern may be postop enhancement rather than residual tumor. Continued close follow-up recommended.  Left parasagittal meningioma resection. Postop surgical cavity containing blood. Mild peripheral enhancement around the cavity similar that seen on the right may be postop enhancement.  Diffusion-weighted imaging reveals several areas of restricted diffusion consistent with acute infarct. Small area of acute infarction in the right parietal cortex, several cm from the resection site. Small areas of restricted diffusion in the frontal lobes bilaterally compatible with small areas of infarction.  Restricted diffusion is also present over the convexity bilaterally near the resection site most consistent with infarct adjacent to the tumor resection site. This measures approximately 10 x 15 mm on the right and 15 mm by 20 mm on the left.  Calcified meningioma along the falx measuring approximate 1 cm is unchanged and was not resected.  Ventricle size is normal. No shift of the midline structures.  IMPRESSION: Postop day 1 resection of bilateral parasagittal meningioma is in the parietal lobe. Post surgical cavity filled with blood bilaterally. There is thin linear enhancement along the wall of the surgical cavity bilaterally likely due to early postoperative enhancement. Continued followup is suggested to rule out residual tumor.  Acute infarction adjacent to the resection cavity bilaterally. Also remote small areas of infarct in the right parietal lobe and bilateral frontal lobes. Question emboli   Electronically Signed   By: Franchot Gallo M.D.   On: 04/07/2014 20:44   [name] EN Contrast  03/21/2014   CLINICAL DATA:  Meningiomas. Continued surveillance. Unsteady gait. Subsequent encounter. Brain lab protocol.  EXAM: MRI HEAD WITHOUT AND WITH CONTRAST  TECHNIQUE: Multiplanar, multiecho pulse sequences of the brain and surrounding structures were obtained without and with intravenous contrast.  CONTRAST:  37mL MULTIHANCE GADOBENATE DIMEGLUMINE 529 MG/ML IV SOLN  COMPARISON:  MR brain 02/19/2014.  CT head 02/08/2014.  FINDINGS: Redemonstrated are 3 intracranial mass lesions consistent with meningiomas.  The largest involves the LEFT posterior frontal parasagittal falx, growing into the superior sagittal sinus, measuring 21 x 31 x 28 mm. Mild compression of the surrounding brain without significant vasogenic edema. Restricted diffusion with only minimal hemorrhage or mineralization.  Next largest involves the posterior medial parasagittal RIGHT frontal lobe, with central necrosis, probably extra-axial  given its large border with falx but different from the first lesion in that there is moderate surrounding vasogenic edema and some cystic necrosis. Unchanged size measuring 18 x  32 x 26 mm. Restricted diffusion with moderate intra tumoral hemorrhage or mineralization.  Smallest lesion projects from the inferior falx centrally, roughly 1 cm in size, heavily calcified, also consistent with a meningioma.  Unchanged appearance of the surrounding brain and extracranial soft tissues.  IMPRESSION: Findings most consistent with 3 extra-axial masses, likely meningiomas. Ingrowth of tumor into the superior sagittal sinus related to the largest LEFT posterior frontal parasagittal lesion. See discussion above.   Electronically Signed   By: Rolla Flatten M.D.   On: 03/21/2014 10:57   Ct Abdomen Pelvis W Contrast  04/13/2014   CLINICAL DATA:  Small cell carcinoma, 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  EXAM: CT CHEST, ABDOMEN, AND PELVIS WITH CONTRAST  TECHNIQUE: Multidetector CT imaging of the chest, abdomen and pelvis was performed following the standard protocol during bolus administration of intravenous contrast.  CONTRAST:  142mL OMNIPAQUE IOHEXOL 300 MG/ML  SOLN  COMPARISON:  None.  FINDINGS: CT CHEST FINDINGS  Left lobe of the thyroid is asymmetrically enlarged and contains low-attenuation nodules measuring up to 1.6 cm. No pathologically enlarged mediastinal, hilar or axillary lymph nodes. Atherosclerotic calcification of the arterial vasculature, including three-vessel involvement of the coronary arteries. Heart size normal. No pericardial effusion. Small hiatal hernia also contains fluid.  Minimal biapical pleural parenchymal scarring. Probable subpleural atelectasis or scarring along the superior aspect of the right major fissure (image 22). Lungs are otherwise clear. No pleural fluid. Airway is unremarkable.  CT ABDOMEN AND PELVIS FINDINGS  Hepatobiliary: Liver and gallbladder are unremarkable. No biliary ductal  dilatation.  Pancreas: Negative.  Spleen: Negative.  Adrenals/Urinary Tract: Adrenal glands and kidneys are unremarkable. Ureters are decompressed. Prostate indents the bladder base.  Stomach/Bowel: Small hiatal hernia. Stomach, small bowel and appendix are unremarkable. A polypoid soft tissue lesion is seen in the cecum, measuring approximately 1.7 x 2.4 cm (axial image 97 and coronal image 82). Enlarged prostate exerts mass effect on the rectum. Colon is otherwise unremarkable.  Vascular/Lymphatic: Atherosclerotic calcification of the arterial vasculature without abdominal aortic aneurysm. Circumaortic left renal vein. Ileocolic mesenteric lymph node measures 1.2 x 2.1 cm (axial image 96). Lymph nodes are seen adjacent to the prostate, along the superior margin at the 7 o'clock position, measuring up to 11 mm in short axis (axial image 127). No additional pathologically enlarged lymph nodes.  Reproductive: Prostate is markedly enlarged and low in attenuation centrally, measuring approximately 7.5 x 8.1 cm.  Other: A heterogeneous subcutaneous nodule is seen in the inferior left flank, measuring 1.2 x 1.9 cm (axial image 87). Small bilateral inguinal hernias contain fat, left greater than right. Locules of air in the subcutaneous ventral abdominal wall are most likely iatrogenic. Mesenteries and peritoneum are otherwise unremarkable.  Musculoskeletal: A vague area of sclerosis in the left iliac wing (axial image 113) is nonspecific. Otherwise, no worrisome lytic or sclerotic lesions.  IMPRESSION: 1. No evidence of primary bronchogenic carcinoma. 2. Polypoid lesion in the cecum, with a pathologically enlarged ileocolic mesenteric lymph node, worrisome for primary colon carcinoma. 3. Markedly enlarged and necrotic appearing prostate with adjacent adenopathy. Associated mass effect on the bladder and rectum. 4. Heterogeneous subcutaneous nodule in the inferior left flank, likely palpable on physical examination.  Appearance is not consistent with a sebaceous cyst. 5. Asymmetrically enlarged left thyroid with low-attenuation lesions. Consider further evaluation with thyroid ultrasound. If patient is clinically hyperthyroid, consider nuclear medicine thyroid uptake and scan. 6. Small hiatal hernia contains a small amount of fluid. 7. Three-vessel coronary artery calcification. 8.  Small bilateral inguinal hernias contain fat.   Electronically Signed   By: Lorin Picket M.D.   On: 04/13/2014 08:30   Dg Chest Port 1 View  04/06/2014   CLINICAL DATA:  Initial encounter for central line placement  EXAM: PORTABLE CHEST - 1 VIEW  COMPARISON:  03/29/2014  FINDINGS: 1355 hrs. Right IJ central line is new in the interval with tip position overlying the proximal to mid SVC level. Patient is rotated to the right, displacing cardio mediastinal anatomy into the medial right hemi thorax. No evidence for pneumothorax or pleural effusion. Vascular congestion is evident without airspace pulmonary edema. Cardiopericardial silhouette is at upper limits of normal for size. Imaged bony structures of the thorax are intact.  IMPRESSION: No evidence for pneumothorax status post central line placement.   Electronically Signed   By: Misty Stanley M.D.   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   He would benefit from whole brain radiotherapy to help control brain metastases.  He would also benefit from prostate radiotherapy to help reduce his risk for rectal and urethral obstruction.  PLAN:Today, I talked to the patient about the findings and work-up thus far.  We discussed the natural history of brain metastases and prostate small cell carcinoma and general treatment, highlighting the role of radiotherapy in the management.  We discussed the available radiation techniques, and focused on the details of logistics and delivery.   We reviewed the anticipated acute and late sequelae associated with radiation in this setting.  The patient was encouraged to ask questions that I answered to the best of my ability.  The patient would like to proceed with radiation and will be scheduled for CT simulation.  We will plan simulation on 04/20/14.  I spent 30 minutes minutes face to face with the patient and more than 50% of that time was spent in counseling and/or coordination of care.    ------------------------------------------------  Sheral Apley. Tammi Klippel, M.D.

## 2014-04-20 NOTE — Plan of Care (Signed)
Problem: RH BOWEL ELIMINATION Goal: RH STG MANAGE BOWEL WITH ASSISTANCE STG Manage Bowel with min Assistance.  Outcome: Progressing Goal: RH STG MANAGE BOWEL W/MEDICATION W/ASSISTANCE STG Manage Bowel with Medication with min. Assistance.  Outcome: Progressing  Problem: RH SKIN INTEGRITY Goal: RH STG SKIN FREE OF INFECTION/BREAKDOWN With min. assisst  Outcome: Progressing Goal: RH STG MAINTAIN SKIN INTEGRITY WITH ASSISTANCE STG Maintain Skin Integrity With min.Assistance.  Outcome: Progressing Goal: RH OTHER STG SKIN INTEGRITY GOALS W/ASSIST Other STG Skin Integrity Goals With Assistance.  Outcome: Not Applicable Date Met:  41/42/39

## 2014-04-20 NOTE — Plan of Care (Signed)
Problem: RH BOWEL ELIMINATION Goal: RH STG MANAGE BOWEL WITH ASSISTANCE STG Manage Bowel with min Assistance.  Outcome: Progressing Goal: RH STG MANAGE BOWEL W/MEDICATION W/ASSISTANCE STG Manage Bowel with Medication with min. Assistance.  Outcome: Progressing  Problem: RH SKIN INTEGRITY Goal: RH STG SKIN FREE OF INFECTION/BREAKDOWN With min. assisst  Outcome: Progressing Goal: RH STG MAINTAIN SKIN INTEGRITY WITH ASSISTANCE STG Maintain Skin Integrity With min.Assistance.  Outcome: Progressing Goal: RH OTHER STG SKIN INTEGRITY GOALS W/ASSIST Other STG Skin Integrity Goals With Assistance.  Outcome: Progressing  Problem: RH SAFETY Goal: RH STG ADHERE TO SAFETY PRECAUTIONS W/ASSISTANCE/DEVICE STG Adhere to Safety Precautions With mod. Assistance/Device.  Outcome: Progressing Goal: RH STG DECREASED RISK OF FALL WITH ASSISTANCE STG Decreased Risk of Fall With Assistance.  Outcome: Progressing

## 2014-04-21 ENCOUNTER — Inpatient Hospital Stay (HOSPITAL_COMMUNITY): Payer: Medicare Other | Admitting: Occupational Therapy

## 2014-04-21 DIAGNOSIS — G8310 Monoplegia of lower limb affecting unspecified side: Secondary | ICD-10-CM

## 2014-04-21 LAB — GLUCOSE, CAPILLARY
GLUCOSE-CAPILLARY: 61 mg/dL — AB (ref 70–99)
GLUCOSE-CAPILLARY: 180 mg/dL — AB (ref 70–99)
Glucose-Capillary: 89 mg/dL (ref 70–99)
Glucose-Capillary: 80 mg/dL (ref 70–99)
Glucose-Capillary: 221 mg/dL — ABNORMAL HIGH (ref 70–99)

## 2014-04-21 NOTE — Progress Notes (Signed)
Occupational Therapy Session Note  Patient Details  Name: Ivan Stark MRN: [id number] Date of Birth: December 15, 1945  Today's Date: 04/21/2014 OT Individual Time: 3212-2482 OT Individual Time Calculation (min): 60 min     Skilled Therapeutic Interventions/Progress Updates: RN checking glucose this am as it was 60 earlier and 89 after he ate.   RN and patient to consult with MD for advisement regarding the glucose levels and his medications.  Patient requested need to have BM.  With extra time he was able to have a BM.  Rest of session was focused on transfers (Min A when proper w/c to transferring stabilizing surface were correct); showering close S with lateral leans to bathe periarea (he did not want to stand); lower body dressing (min A for AFO shoe).    Therapy Documentation Precautions:  Precautions Precautions: Fall Precaution Comments: LLE hemiplegia Restrictions Weight Bearing Restrictions: No    Pain:denied    See FIM for current functional status  Therapy/Group: Individual Therapy  Alfredia Ferguson [facility] 04/21/2014, 8:46 AM

## 2014-04-21 NOTE — Plan of Care (Signed)
Problem: RH BOWEL ELIMINATION Goal: RH STG MANAGE BOWEL WITH ASSISTANCE STG Manage Bowel with min Assistance.  Outcome: Progressing Goal: RH STG MANAGE BOWEL W/MEDICATION W/ASSISTANCE STG Manage Bowel with Medication with min. Assistance.  Outcome: Progressing  Problem: RH SKIN INTEGRITY Goal: RH STG SKIN FREE OF INFECTION/BREAKDOWN With min. assisst  Outcome: Progressing Goal: RH STG MAINTAIN SKIN INTEGRITY WITH ASSISTANCE STG Maintain Skin Integrity With min.Assistance.  Outcome: Progressing  Problem: RH SAFETY Goal: RH STG ADHERE TO SAFETY PRECAUTIONS W/ASSISTANCE/DEVICE STG Adhere to Safety Precautions With mod. Assistance/Device.  Outcome: Progressing Goal: RH STG DECREASED RISK OF FALL WITH ASSISTANCE STG Decreased Risk of Fall With Assistance.  Outcome: Progressing

## 2014-04-21 NOTE — Plan of Care (Signed)
Problem: RH BOWEL ELIMINATION Goal: RH STG MANAGE BOWEL W/MEDICATION W/ASSISTANCE STG Manage Bowel with Medication with min. Assistance.  Outcome: Progressing LBM 04/19/14  Problem: RH SKIN INTEGRITY Goal: RH STG SKIN FREE OF INFECTION/BREAKDOWN With min. assisst  Outcome: Progressing Goal: RH STG MAINTAIN SKIN INTEGRITY WITH ASSISTANCE STG Maintain Skin Integrity With min.Assistance.  Outcome: Progressing  Problem: RH SAFETY Goal: RH STG ADHERE TO SAFETY PRECAUTIONS W/ASSISTANCE/DEVICE STG Adhere to Safety Precautions With mod. Assistance/Device.  Outcome: Progressing Goal: RH STG DECREASED RISK OF FALL WITH ASSISTANCE STG Decreased Risk of Fall With Assistance.  Outcome: Progressing

## 2014-04-21 NOTE — Progress Notes (Signed)
[place] PHYSICAL MEDICINE & REHABILITATION     PROGRESS NOTE    Subjective/Complaints:  In good spirits  Objective: Vital Signs: Blood pressure 110/69, pulse 65, temperature 97.6 F (36.4 C), temperature source Oral, resp. rate 18, height 6\' 4"  (1.93 m), weight 88.996 kg (196 lb 3.2 oz), SpO2 99 %. No results found. No results for input(s): WBC, HGB, HCT, PLT in the last 72 hours. No results for input(s): NA, K, CL, GLUCOSE, BUN, CREATININE, CALCIUM in the last 72 hours.  Invalid input(s): CO CBG (last 3)   Recent Labs  04/20/14 1657 04/20/14 1954 04/21/14 0723  GLUCAP 88 119* 61*    Wt Readings from Last 3 Encounters:  04/18/14 88.996 kg (196 lb 3.2 oz)  04/06/14 94.8 kg (208 lb 15.9 oz)  03/29/14 95.981 kg (211 lb 9.6 oz)    Physical Exam:  Constitutional: He is oriented to person, place, and time. He appears well-developed.  HENT: oral mucosa pink and moist Craniotomy site with staples intact. Clean. Old blood around incision Eyes: EOM are normal.  Neck: Normal range of motion. Neck supple. No thyromegaly present.  Cardiovascular: Normal rate and regular rhythm.  Respiratory: Effort normal and breath sounds normal. No respiratory distress.  GI: Soft. Bowel sounds are normal. He exhibits no distension.  Neurological: He is alert and oriented to person, place, and time.  Follows commands. Good insight and awareness. Memory intact. Language normal. no CN deficits.  Right upper extremity 5/5 deltoid, biceps, triceps, grip, left upper extremity 4+/5 in the deltoid, bicep, tricep, grip. 4/5 right hip flexor knee extensor and ankle dorsiflexor plantar flexor. Left lower extremity Trace to 1/5 hip flexor knee extensor, 2/5 HF.  tr   ankle dorsiflexor plantar flexor Sensation sl diminished to LT left arm and leg. Mood and affect are appropriate No left neglect. No abnormal tone.  Skin: Skin is warm and dry.  Psych: mood remains pleasant and up  beat  Assessment/Plan: 1. Functional deficits secondary to right frontal meningioma, metastatic lung cancer which require 3+ hours per day of interdisciplinary therapy in a comprehensive inpatient rehab setting. Physiatrist is providing close team supervision and 24 hour management of active medical problems listed below. Physiatrist and rehab team continue to assess barriers to discharge/monitor patient progress toward functional and medical goals.   left AFO per [employer]  FIM: FIM - Bathing Bathing Steps Patient Completed: Chest, Right Arm, Left Arm, Abdomen, Front perineal area, Buttocks, Left lower leg (including foot), Right lower leg (including foot), Left upper leg, Right upper leg Bathing: 4: Steadying assist  FIM - Upper Body Dressing/Undressing Upper body dressing/undressing steps patient completed: Thread/unthread right sleeve of pullover shirt/dresss, Thread/unthread left sleeve of pullover shirt/dress, Put head through opening of pull over shirt/dress, Pull shirt over trunk Upper body dressing/undressing: 5: Set-up assist to: Obtain clothing/put away FIM - Lower Body Dressing/Undressing Lower body dressing/undressing steps patient completed: Thread/unthread right underwear leg, Thread/unthread left underwear leg, Pull underwear up/down, Thread/unthread right pants leg, Thread/unthread left pants leg, Pull pants up/down, Don/Doff left sock, Don/Doff right sock Lower body dressing/undressing: 4: Steadying Assist  FIM - Toileting Toileting steps completed by patient: Adjust clothing prior to toileting, Performs perineal hygiene, Adjust clothing after toileting Toileting Assistive Devices: Grab bar or rail for support Toileting: 4: Steadying assist  FIM - Radio producer Devices: Elevated toilet seat, Grab bars Toilet Transfers: 4-To toilet/BSC: Min A (steadying Pt. > 75%), 4-From toilet/BSC: Min A (steadying Pt. > 75%)  FIM -  Bed/Chair  Financial planner Devices: Arm rests, Copy: 5: Supine > Sit: Supervision (verbal cues/safety issues), 4: Sit > Supine: Min A (steadying pt. > 75%/lift 1 leg), 4: Chair or W/C > Bed: Min A (steadying Pt. > 75%), 4: Bed > Chair or W/C: Min A (steadying Pt. > 75%)  FIM - Locomotion: Wheelchair Distance: 150 Locomotion: Wheelchair: 5: Travels 150 ft or more: maneuvers on rugs and over door sills with supervision, cueing or coaxing FIM - Locomotion: Ambulation Locomotion: Ambulation Assistive Devices: Administrator Ambulation/Gait Assistance: 4: Min assist, 4: Min guard Locomotion: Ambulation: 2: Travels 50 - 149 ft with minimal assistance (Pt.>75%)  Comprehension Comprehension Mode: Auditory Comprehension: 7-Follows complex conversation/direction: With no assist  Expression Expression Mode: Verbal Expression: 7-Expresses complex ideas: With no assist  Social Interaction Social Interaction: 7-Interacts appropriately with others - No medications needed.  Problem Solving Problem Solving: 6-Solves complex problems: With extra time  Memory Memory Mode: Asleep Memory: 6-More than reasonable amt of time   Medical Problem List and Plan: 1. 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 DVT Prophylaxis/Anticoagulation: Pharmaceutical: Lovenox--cleared to initiate per Dr. Kathyrn Sheriff 3. Pain Management: Will continue hydrocodone prn.  4. Mood: Reports anxiety/restlessness due to steroids.   low dose xanax prn. LCSW to follow for evaluation and support.  5. Neuropsych: This patient is capable of making decisions on his own behalf. 6. Skin/Wound Care: Routine pressure relief measures.  7. Fluids/Electrolytes/Nutrition: Monitor intake. Offer supplements as needed to maintain adequate intake.  8. DM type 2: Monitor BS ac/hs and continue Levemir for now. ON amaryl  PTA---currently on metformin bid additionally.   resumed amaryl at 2mg  daily today--sugars improved 9. Seizure prophylaxis: Continue Keppra bid.  10. HTN: Continue vasotec daily. Monitor BP every 8 hours and adjust as needed.   12 Constipation: moved bowels 11/9---sorbitol if no bm today 13. Oncology: necrotic prostrate on CT-prostate bx'ed  by urology, small cell ca  -   -dr. Benay Spice following  -rad/onc---marking today   LOS (Days) [place] E 04/21/2014 7:37 AM

## 2014-04-22 ENCOUNTER — Inpatient Hospital Stay (HOSPITAL_COMMUNITY): Payer: Medicare Other | Admitting: Occupational Therapy

## 2014-04-22 ENCOUNTER — Inpatient Hospital Stay (HOSPITAL_COMMUNITY): Payer: Medicare Other | Admitting: Physical Therapy

## 2014-04-22 LAB — GLUCOSE, CAPILLARY
GLUCOSE-CAPILLARY: 72 mg/dL (ref 70–99)
GLUCOSE-CAPILLARY: 110 mg/dL — AB (ref 70–99)
Glucose-Capillary: 68 mg/dL — ABNORMAL LOW (ref 70–99)
Glucose-Capillary: 78 mg/dL (ref 70–99)
Glucose-Capillary: 117 mg/dL — ABNORMAL HIGH (ref 70–99)

## 2014-04-22 NOTE — Progress Notes (Signed)
Occupational Therapy Session Note  Patient Details  Name: Ivan Stark MRN: [id number] Date of Birth: 05-Nov-1945  Today's Date: 04/22/2014 OT Individual Time: 0930-1030 OT Individual Time Calculation (min): 60 min   Skilled Therapeutic Interventions/Progress Updates: ADL on tub transfer bench in shower with focus on safe lateral leans and stablizing left foot over right leg for bathing and dressing and strength and stability for dynamic standing to pull up pants and underwear.  Patient performed side scoots with close S today.    Therapy Documentation Precautions:  Precautions Precautions: Fall Precaution Comments: LLE hemiplegia Restrictions Weight Bearing Restrictions: No   Pain:denied    See FIM for current functional status  Therapy/Group: Individual Therapy  Alfredia Ferguson [facility] 04/22/2014, 2:25 PM

## 2014-04-22 NOTE — Progress Notes (Signed)
Hypoglycemic Event  CBG: 68  Treatment: juice and meal (lunch)  Symptoms: none  Follow-up CBG: Time:1208 CBG Result:78  Possible Reasons for Event: medication  Comments/MD notified:    Jillyn Ledger  Remember to initiate Hypoglycemia Order Set & complete

## 2014-04-22 NOTE — Progress Notes (Addendum)
I[facility] PHYSICAL MEDICINE & REHABILITATION     PROGRESS NOTE    Subjective/Complaints: Visited with grandkids yesterday  ROS:  Left foot and ankle weakness Objective: Vital Signs: Blood pressure 105/61, pulse 70, temperature 97.6 F (36.4 C), temperature source Oral, resp. rate 16, height 6\' 4"  (1.93 m), weight 88.996 kg (196 lb 3.2 oz), SpO2 98 %. No results found. No results for input(s): WBC, HGB, HCT, PLT in the last 72 hours. No results for input(s): NA, K, CL, GLUCOSE, BUN, CREATININE, CALCIUM in the last 72 hours.  Invalid input(s): CO CBG (last 3)   Recent Labs  04/21/14 1137 04/21/14 1613 04/21/14 2048  GLUCAP 80 180* 221*    Wt Readings from Last 3 Encounters:  04/18/14 88.996 kg (196 lb 3.2 oz)  04/06/14 94.8 kg (208 lb 15.9 oz)  03/29/14 95.981 kg (211 lb 9.6 oz)    Physical Exam:  Constitutional: He is oriented to person, place, and time. He appears well-developed.  HENT: oral mucosa pink and moist Craniotomy site with staples intact. Clean. Old blood around incision Eyes: EOM are normal.  Neck: Normal range of motion. Neck supple. No thyromegaly present.  Cardiovascular: Normal rate and regular rhythm.  Respiratory: Effort normal and breath sounds normal. No respiratory distress.  GI: Soft. Bowel sounds are normal. He exhibits no distension.  Neurological: He is alert and oriented to person, place, and time.  Follows commands. Good insight and awareness. Memory intact. Language normal. no CN deficits.  Right upper extremity 5/5 deltoid, biceps, triceps, grip, left upper extremity 4+/5 in the deltoid, bicep, tricep, grip. 4/5 right hip flexor knee extensor and ankle dorsiflexor plantar flexor. Left lower extremity  3-/5 hip flexor knee extensor, 2/5 HF.  tr   ankle dorsiflexor plantar flexor Sensation sl diminished to LT left  leg. Mood and affect are appropriate No left neglect. No abnormal tone.  Skin: Skin is warm and dry.  Psych: mood  remains pleasant and up beat  Assessment/Plan: 1. Functional deficits secondary to right frontal meningioma, metastatic lung cancer which require 3+ hours per day of interdisciplinary therapy in a comprehensive inpatient rehab setting. Physiatrist is providing close team supervision and 24 hour management of active medical problems listed below. Physiatrist and rehab team continue to assess barriers to discharge/monitor patient progress toward functional and medical goals.    FIM: FIM - Bathing Bathing Steps Patient Completed: Chest, Right Arm, Left Arm, Abdomen, Front perineal area, Buttocks, Left lower leg (including foot), Right lower leg (including foot), Left upper leg, Right upper leg Bathing: 4: Steadying assist  FIM - Upper Body Dressing/Undressing Upper body dressing/undressing steps patient completed: Thread/unthread right sleeve of pullover shirt/dresss, Pull shirt over trunk, Thread/unthread left sleeve of pullover shirt/dress, Put head through opening of pull over shirt/dress Upper body dressing/undressing: 5: Supervision: Safety issues/verbal cues FIM - Lower Body Dressing/Undressing Lower body dressing/undressing steps patient completed: Thread/unthread right underwear leg, Thread/unthread left pants leg, Don/Doff left sock, Pull pants up/down, Thread/unthread left underwear leg, Pull underwear up/down, Fasten/unfasten pants, Thread/unthread right pants leg, Don/Doff right sock Lower body dressing/undressing: 4: Min-Patient completed 75 plus % of tasks  FIM - Toileting Toileting steps completed by patient: Adjust clothing prior to toileting, Performs perineal hygiene, Adjust clothing after toileting Toileting Assistive Devices: Grab bar or rail for support Toileting: 5: Supervision: Safety issues/verbal cues  FIM - Radio producer Devices: Grab bars Toilet Transfers: 5-From toilet/BSC: Supervision (verbal cues/safety issues), 5-To toilet/BSC:  Supervision (verbal cues/safety issues)  FIM - Control and instrumentation engineer Devices: Arm rests, Copy: 5: Bed > Chair or W/C: Supervision (verbal cues/safety issues)  FIM - Locomotion: Wheelchair Distance: 150 Locomotion: Wheelchair: 5: Travels 150 ft or more: maneuvers on rugs and over door sills with supervision, cueing or coaxing FIM - Locomotion: Ambulation Locomotion: Ambulation Assistive Devices: Administrator Ambulation/Gait Assistance: 4: Min assist, 4: Min guard Locomotion: Ambulation: 2: Travels 50 - 149 ft with minimal assistance (Pt.>75%)  Comprehension Comprehension Mode: Auditory Comprehension: 7-Follows complex conversation/direction: With no assist  Expression Expression Mode: Verbal Expression: 7-Expresses complex ideas: With no assist  Social Interaction Social Interaction: 7-Interacts appropriately with others - No medications needed.  Problem Solving Problem Solving: 6-Solves complex problems: With extra time  Memory Memory Mode: Asleep Memory: 6-More than reasonable amt of time   Medical Problem List and Plan: 1. 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 DVT Prophylaxis/Anticoagulation: Pharmaceutical: Lovenox--cleared to initiate per Dr. Kathyrn Sheriff 3. Pain Management: Will continue hydrocodone prn.  4. Mood: Reports anxiety/restlessness due to steroids.   low dose xanax prn. LCSW to follow for evaluation and support.  5. Neuropsych: This patient is capable of making decisions on his own behalf. 6. Skin/Wound Care: Routine pressure relief measures.  7. Fluids/Electrolytes/Nutrition: Monitor intake. Offer supplements as needed to maintain adequate intake.  8. DM type 2: Monitor BS ac/hs  ON amaryl PTA---currently on metformin bid additionally.   resumed amaryl at 2mg  daily today--low am CBG d/c levimir 9. Seizure  prophylaxis: Continue Keppra bid.  10. HTN: Continue vasotec daily. Monitor BP every 8 hours and adjust as needed.   12 Constipation: moved bowels 11/9---sorbitol if no bm today 13. Oncology: necrotic prostrate on CT-prostate bx'ed  by urology, small cell ca  -   -dr. Benay Spice following  -rad/onc---marking today   LOS (Days) [place] E 04/22/2014 6:57 AM

## 2014-04-22 NOTE — Progress Notes (Signed)
Physical Therapy Session Note  Patient Details  Name: Ivan Stark MRN: [id number] Date of Birth: 1946/04/23  Today's Date: 04/22/2014 PT Individual Time: 1300-1400 PT Individual Time Calculation (min): 60 min   Short Term Goals: Week 1:  PT Short Term Goal 1 (Week 1): Pt will perform bed mobility with min A.  PT Short Term Goal 1 - Progress (Week 1): Met PT Short Term Goal 2 (Week 1): Pt will transfer bed <> w/c wtih min A.  PT Short Term Goal 2 - Progress (Week 1): Met PT Short Term Goal 3 (Week 1): Pt will ambulate 25 ft with mod A.  PT Short Term Goal 3 - Progress (Week 1): Met PT Short Term Goal 4 (Week 1): Pt will negotiate 3 stairs with 2 rails and mod A.  PT Short Term Goal 4 - Progress (Week 1): Met  Skilled Therapeutic Interventions/Progress Updates:  Pt was seen sitting bedside chair in the pm. Pt transferred sit to stand with S and rolling walker. Pt ambulated 100 feet with rolling walker, L orthosis and S to min guard with verbal cues. Pt propelled w/c about 75 feet with B UEs and R LE. In gym, pt performed 3 sets x5 reps STS and 3 sets x10 reps mini squats for LE strengthening. Pt propelled w/c about 100 feet with B UEs and R LE with S. Pt performed car transfers with rolling walker and min A. Pt propelled w/c back to room with S and B UEs with R LE. Pt transferred w/c to edge of bed with S. Pt transferred edge of bed to supine with min A.    Therapy Documentation Precautions:  Precautions Precautions: Fall Precaution Comments: LLE hemiplegia Restrictions Weight Bearing Restrictions: No General:   Pain: No c/o pain.    Locomotion : Ambulation Ambulation/Gait Assistance: 5: Supervision;4: Min guard   See FIM for current functional status  Therapy/Group: Individual Therapy  Dub Amis 04/22/2014, 3:07 PM

## 2014-04-22 NOTE — Progress Notes (Signed)
Physical Therapy Session Note  Patient Details  Name: Ivan Stark MRN: [id number] Date of Birth: 09/09/1945  Today's Date: 04/22/2014 PT Individual Time: 0800-0900 PT Individual Time Calculation (min): 60 min   Short Term Goals: Week 1:  PT Short Term Goal 1 (Week 1): Pt will perform bed mobility with min A.  PT Short Term Goal 1 - Progress (Week 1): Met PT Short Term Goal 2 (Week 1): Pt will transfer bed <> w/c wtih min A.  PT Short Term Goal 2 - Progress (Week 1): Met PT Short Term Goal 3 (Week 1): Pt will ambulate 25 ft with mod A.  PT Short Term Goal 3 - Progress (Week 1): Met PT Short Term Goal 4 (Week 1): Pt will negotiate 3 stairs with 2 rails and mod A.  PT Short Term Goal 4 - Progress (Week 1): Met  Skilled Therapeutic Interventions/Progress Updates:  Pt was seen bedside in the am. Pt transferred supine to edge of bed with side rail and head of bed elevated with S. Pt tolerated edge of bed about 5 minutes with S. Pt transferred sit to stand with S to min guard with rolling walker and verbal cues. Pt ambulated to and from bathroom with rolling walker and S to min guard with verbal cues. Pt propelled w/c about 175 feet with R LE and B UEs with S and verbal cues. In gym treatment focused on NMR and LE strengthening, performed step ups, 3 sets x 10 reps each. Pt performed multiple sit to stand transfers with S and rolling walker. Pt ambulated with rolling walker and L orthosis about 50 feet with S to min guard. Pt propelled w/c back to room with B UEs and R LE, requiring S only.   Therapy Documentation Precautions:  Precautions Precautions: Fall Precaution Comments: LLE hemiplegia Restrictions Weight Bearing Restrictions: No General:   Pain: No c/o pain.   Locomotion : Ambulation Ambulation/Gait Assistance: 5: Supervision;4: Min guard   See FIM for current functional status  Therapy/Group: Individual Therapy  ,  G 04/22/2014, 12:29 PM  

## 2014-04-23 ENCOUNTER — Inpatient Hospital Stay (HOSPITAL_COMMUNITY): Payer: Medicare Other

## 2014-04-23 LAB — GLUCOSE, CAPILLARY
GLUCOSE-CAPILLARY: 74 mg/dL (ref 70–99)
GLUCOSE-CAPILLARY: 86 mg/dL (ref 70–99)
Glucose-Capillary: 157 mg/dL — ABNORMAL HIGH (ref 70–99)
Glucose-Capillary: 136 mg/dL — ABNORMAL HIGH (ref 70–99)

## 2014-04-23 MED ORDER — INSULIN ASPART 100 UNIT/ML ~~LOC~~ SOLN
0.0000 [IU] | Freq: Three times a day (TID) | SUBCUTANEOUS | Status: DC
  Administered 2014-04-24 – 2014-04-25 (×2): 5 [IU] via SUBCUTANEOUS
  Administered 2014-04-26: 1 [IU] via SUBCUTANEOUS
  Administered 2014-04-27: 2 [IU] via SUBCUTANEOUS

## 2014-04-23 NOTE — Plan of Care (Signed)
Problem: RH BOWEL ELIMINATION Goal: RH STG MANAGE BOWEL WITH ASSISTANCE STG Manage Bowel with min Assistance.  Outcome: Progressing Goal: RH STG MANAGE BOWEL W/MEDICATION W/ASSISTANCE STG Manage Bowel with Medication with min. Assistance.  Outcome: Progressing  Problem: RH SKIN INTEGRITY Goal: RH STG SKIN FREE OF INFECTION/BREAKDOWN Skin to remain free from infection and breakdown while on rehab With min. assist  Outcome: Progressing Goal: RH STG MAINTAIN SKIN INTEGRITY WITH ASSISTANCE STG Maintain Skin Integrity With min.Assistance.  Outcome: Progressing  Problem: RH SAFETY Goal: RH STG ADHERE TO SAFETY PRECAUTIONS W/ASSISTANCE/DEVICE STG Adhere to Safety Precautions With mod. Assistance/Device.  Outcome: Progressing Goal: RH STG DECREASED RISK OF FALL WITH ASSISTANCE STG Decreased Risk of Fall With Assistance.  Outcome: Progressing

## 2014-04-23 NOTE — Progress Notes (Signed)
[place] PH[facility]ICINE & REHABILITATION     PROGRESS NOTE    Subjective/Complaints: Happy that he moved bowels yesterday. Trying to eat a little "lighter" than he has ( has felt full ). Emptying bladder without issue  ROS:  Left foot and ankle weakness Objective: Vital Signs: Blood pressure 101/63, pulse 65, temperature 98.1 F (36.7 C), temperature source Oral, resp. rate 16, height 6\' 4"  (1.93 m), weight 88.996 kg (196 lb 3.2 oz), SpO2 98 %. No results found. No results for input(s): WBC, HGB, HCT, PLT in the last 72 hours. No results for input(s): NA, K, CL, GLUCOSE, BUN, CREATININE, CALCIUM in the last 72 hours.  Invalid input(s): CO CBG (last 3)   Recent Labs  04/22/14 1637 04/22/14 2046 04/23/14 0641  GLUCAP 110* 117* 157*    Wt Readings from Last 3 Encounters:  04/18/14 88.996 kg (196 lb 3.2 oz)  04/06/14 94.8 kg (208 lb 15.9 oz)  03/29/14 95.981 kg (211 lb 9.6 oz)    Physical Exam:  Constitutional: He is oriented to person, place, and time. He appears well-developed.  HENT: oral mucosa pink and moist Craniotomy site with staples intact. Clean. Old blood around incision Eyes: EOM are normal.  Neck: Normal range of motion. Neck supple. No thyromegaly present.  Cardiovascular: Normal rate and regular rhythm.  Respiratory: Effort normal and breath sounds normal. No respiratory distress.  GI: Soft. Bowel sounds are normal. He exhibits no distension.  Neurological: He is alert and oriented to person, place, and time.  Follows commands. Good insight and awareness. Memory intact. Language normal. no CN deficits.  Right upper extremity 5/5 deltoid, biceps, triceps, grip, left upper extremity 4+/5 in the deltoid, bicep, tricep, grip. 4/5 right hip flexor knee extensor and ankle dorsiflexor plantar flexor. Left lower extremity  3-/5 hip flexor knee extensor, 2/5 HF.  tr   ankle dorsiflexor plantar flexor Sensation sl diminished to LT left  leg. Mood and  affect are appropriate No left neglect. No abnormal tone.  Skin: Skin is warm and dry.  Psych: mood remains pleasant and up beat  Assessment/Plan: 1. Functional deficits secondary to right frontal meningioma, metastatic lung cancer which require 3+ hours per day of interdisciplinary therapy in a comprehensive inpatient rehab setting. Physiatrist is providing close team supervision and 24 hour management of active medical problems listed below. Physiatrist and rehab team continue to assess barriers to discharge/monitor patient progress toward functional and medical goals.    FIM: FIM - Bathing Bathing Steps Patient Completed: Chest, Right Arm, Left upper leg, Left Arm, Right lower leg (including foot), Abdomen, Front perineal area, Buttocks, Right upper leg Bathing: 4: Steadying assist  FIM - Upper Body Dressing/Undressing Upper body dressing/undressing steps patient completed: Thread/unthread left sleeve of pullover shirt/dress, Pull shirt over trunk, Put head through opening of pull over shirt/dress, Thread/unthread right sleeve of pullover shirt/dresss Upper body dressing/undressing: 5: Set-up assist to: Obtain clothing/put away FIM - Lower Body Dressing/Undressing Lower body dressing/undressing steps patient completed: Thread/unthread right underwear leg, Thread/unthread left pants leg, Thread/unthread left underwear leg, Pull pants up/down, Don/Doff right shoe, Pull underwear up/down, Fasten/unfasten pants, Thread/unthread right pants leg, Don/Doff right sock Lower body dressing/undressing: 4: Min-Patient completed 75 plus % of tasks  FIM - Toileting Toileting steps completed by patient: Adjust clothing prior to toileting, Performs perineal hygiene, Adjust clothing after toileting Toileting Assistive Devices: Grab bar or rail for support Toileting: 0: Activity did not occur  FIM - Radio producer Devices: Grab  bars Toilet Transfers: 0-Activity did  not occur  FIM - Control and instrumentation engineer Devices: Bed rails, HOB elevated, Walker Bed/Chair Transfer: 4: Sit > Supine: Min A (steadying pt. > 75%/lift 1 leg), 5: Bed > Chair or W/C: Supervision (verbal cues/safety issues), 5: Chair or W/C > Bed: Supervision (verbal cues/safety issues)  FIM - Locomotion: Wheelchair Distance: 150 Locomotion: Wheelchair: 5: Travels 150 ft or more: maneuvers on rugs and over door sills with supervision, cueing or coaxing FIM - Locomotion: Ambulation Locomotion: Ambulation Assistive Devices: Orthosis, Administrator Ambulation/Gait Assistance: 5: Supervision, 4: Min guard Locomotion: Ambulation: 2: Travels 50 - 149 ft with minimal assistance (Pt.>75%)  Comprehension Comprehension Mode: Auditory Comprehension: 7-Follows complex conversation/direction: With no assist  Expression Expression Mode: Verbal Expression: 7-Expresses complex ideas: With no assist  Social Interaction Social Interaction: 7-Interacts appropriately with others - No medications needed.  Problem Solving Problem Solving: 6-Solves complex problems: With extra time  Memory Memory Mode: Asleep Memory: 6-More than reasonable amt of time   Medical Problem List and Plan: 1. 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 DVT Prophylaxis/Anticoagulation: Pharmaceutical: Lovenox--  3. Pain Management: Will continue hydrocodone prn.  4. Mood: Reports anxiety/restlessness due to steroids.   low dose xanax prn. LCSW to follow for evaluation and support.  5. Neuropsych: This patient is capable of making decisions on his own behalf. 6. Skin/Wound Care: Routine pressure relief measures.  7. Fluids/Electrolytes/Nutrition: Monitor intake. Offer supplements as needed to maintain adequate intake.  8. DM type 2: Monitor BS ac/hs  ON amaryl PTA---currently on metformin bid additionally.    resumed amaryl at 2mg  daily today--low am CBG d/c'ed levimir---continue to observe 9. Seizure prophylaxis: Continue Keppra bid.  10. HTN: Continue vasotec daily. Monitor BP every 8 hours and adjust as needed.   12 Constipation: moved bowels yesterday 13. Oncology: necrotic prostrate on CT-prostate bx'ed  by urology, small cell ca   -dr. Benay Spice following  -rad/onc---marked last week   LOS (Days) 12 A FACE TO FACE EVALUATION WAS PERFORMED  SWARTZ,ZACHARY T 04/23/2014 7:39 AM

## 2014-04-23 NOTE — Progress Notes (Signed)
Physical Therapy Session Note  Patient Details  Name: Ivan Stark MRN: [id number] Date of Birth: Dec 12, 1945  Today's Date: 04/23/2014 PT Individual Time: Treatment Session 1: 0900-1000; Treatment Session 2: 1100-1200 PT Individual Time Calculation (min): Treatment Session 1: 60 min; Treatment Session 2: 54min  Short Term Goals: Week 2:  PT Short Term Goal 1 (Week 2): STGs=LTGs due to anticipated LOS  Skilled Therapeutic Interventions/Progress Updates:  Treatment Session 1:  1:1. Pt received sitting in w/c, ready for therapy. Focus this session on donning AFO, education, functional transfers and mobility as well as NMR. Pt req min-mod A for donning AFO with cues for seq, min A for balance sitting in w/c. Reviewed PT goals as well as educated pt on energy conservation and d/c planning. Pt making PT aware that instead of rail being added to steps, family was going to install a ramp. Pt further educated need for continued emphasis on stairs, but increased safety as well as energy conservation with ramp. Pt verbalized understanding.   Pt req min guard A for all squat pivot transfers and min A for all SPT this session with RW, consistent min cues for seq. Pt req supervision for w/c propulsion room<>therapy gym with B UE.   Pt practiced multiple t/f sit<>stand from elevated mat without B UE support for emphasis on coordination/timing of B LE, even weightbearing through B LE and static standing balance. Manual facilitation provided for increased weightshift to L side and overall trunk and pelvic extension.   Pt utilized NuStep to target strength and coordination of B LE, pt with fair tolerance to level 2x5 min due to fatigue. Pt reported feeling, "just very tired today for some reason."  Pt left sitting in w/c with all needs in reach.   Treatment Session 2:  1:1. Pt received sitting in w/c, ready for therapy. Pt with continued reports of fatigue, but agreeable to participate as able. Ongoing  conversation regarding energy conservation. Focus this session on functional mobility and transfers as well as gait training with use of LiteGait. Pt req supervision for w/c propulsion room<>ortho gym with B UE. Pt able to perform multiple t/f sit<>stand and negotiation of step-up onto treadmill with min A and use of LiteGait.   Clinical specialist present to assist with gait mechanics and use of LiteGait. Pt with fair tolerance overall, amb for total of 44' over 50min at 0.70mph. Pt req Ax2 persons for manual facilitation for advancement of B LE due to overall difficulty with initiation/motor planning during new task. Pt unable to generate fluid-reciprocal pattern. Pt req max cues for safety, seq and posture. Pt able to demonstrate improved quality/timing of advancing R or L LE when treadmill not in motion. Pt stating, "I just don't understand why this is so hard for me?" Education provided regarding training specificity RW vs. Use of LiteGait and overall new learning.   Pt req frequent standing/seated rest due to fatigue. HR recorded as 97bpm, SaO2 100% and manual BP 86/60. RN made aware. Pt req min A for squat pivot t/f and t/f sit>sup in bed at end of session. Pt left semi-reclined with all needs in reach and nurse tech present to assess blood sugar level.   Overall, noted that pt req slightly increased cues for new learning/motor planning during both tx sessions today. Will continue to monitor  Therapy Documentation Precautions:  Precautions Precautions: Fall Precaution Comments: LLE hemiplegia Restrictions Weight Bearing Restrictions: No   Vital Signs: Therapy Vitals Pulse Rate: 98 Resp: (!) 100  BP: (!) 80/60 mmHg (reported by therapy) Pain: Pain Assessment Pain Assessment: No/denies pain  See FIM for current functional status  Therapy/Group: Individual Therapy  Gilmore Laroche 04/23/2014, 12:19 PM

## 2014-04-23 NOTE — Plan of Care (Signed)
Problem: RH SAFETY Goal: RH STG ADHERE TO SAFETY PRECAUTIONS W/ASSISTANCE/DEVICE STG Adhere to Safety Precautions With mod. Assistance/Device.  Outcome: Progressing

## 2014-04-23 NOTE — Progress Notes (Signed)
Occupational Therapy Session Note  Patient Details  Name: Ivan Stark MRN: [id number] Date of Birth: 08-31-1945  Today's Date: 04/23/2014 OT Individual Time: 0800-0900 OT Individual Time Calculation (min): 60 min    Short Term Goals: Week 2:  OT Short Term Goal 1 (Week 2): Pt will perform LB dressing sit<>stand with supervision OT Short Term Goal 2 (Week 2): Pt will perform toilet transfers with supervision OT Short Term Goal 3 (Week 2): Pt will perform toileting tasks (3/3) with supervision OT Short Term Goal 4 (Week 2): Pt will perform simple kitchen tasks at w/c level with supervision  Skilled Therapeutic Interventions/Progress Updates:    Pt engaged in BADL retraining including bathing at shower level and dressing with sit<>stand from chair.  Pt performed scoot/squat pivot transfers with close supervision.  Pt performed sit<>stand and standing balance when pulling up pants, requiring steady A/close supervision.  Focus on activity tolerance, sit<>stand, standing balance, transfers, and safety awareness.  Therapy Documentation Precautions:  Precautions Precautions: Fall Precaution Comments: LLE hemiplegia Restrictions Weight Bearing Restrictions: No Pain: Pain Assessment Pain Assessment: No/denies pain See FIM for current functional status  Therapy/Group: Individual Therapy  Leroy Libman 04/23/2014, 9:02 AM

## 2014-04-24 ENCOUNTER — Telehealth: Payer: Self-pay | Admitting: Radiation Oncology

## 2014-04-24 ENCOUNTER — Inpatient Hospital Stay (HOSPITAL_COMMUNITY): Payer: Medicare Other

## 2014-04-24 ENCOUNTER — Encounter: Payer: Self-pay | Admitting: Radiation Oncology

## 2014-04-24 ENCOUNTER — Encounter (HOSPITAL_COMMUNITY): Payer: Medicare Other

## 2014-04-24 ENCOUNTER — Ambulatory Visit
Admit: 2014-04-24 | Discharge: 2014-04-24 | Disposition: A | Discharge status: Home / Self Care | Payer: Medicare Other | Attending: Radiation Oncology | Admitting: Radiation Oncology

## 2014-04-24 ENCOUNTER — Inpatient Hospital Stay (HOSPITAL_COMMUNITY): Payer: Medicare Other | Admitting: Physical Therapy

## 2014-04-24 ENCOUNTER — Other Ambulatory Visit: Payer: Self-pay | Admitting: *Deleted

## 2014-04-24 LAB — GLUCOSE, CAPILLARY
GLUCOSE-CAPILLARY: 83 mg/dL (ref 70–99)
GLUCOSE-CAPILLARY: 69 mg/dL — AB (ref 70–99)
Glucose-Capillary: 71 mg/dL (ref 70–99)
Glucose-Capillary: 276 mg/dL — ABNORMAL HIGH (ref 70–99)

## 2014-04-24 NOTE — Plan of Care (Signed)
Problem: RH BOWEL ELIMINATION Goal: RH STG MANAGE BOWEL WITH ASSISTANCE STG Manage Bowel with min Assistance.  Outcome: Progressing Goal: RH STG MANAGE BOWEL W/MEDICATION W/ASSISTANCE STG Manage Bowel with Medication with min. Assistance.  Outcome: Progressing  Problem: RH SKIN INTEGRITY Goal: RH STG SKIN FREE OF INFECTION/BREAKDOWN Skin to remain free from infection and breakdown while on rehab With min. assist  Outcome: Progressing Goal: RH STG MAINTAIN SKIN INTEGRITY WITH ASSISTANCE STG Maintain Skin Integrity With min.Assistance.  Outcome: Progressing  Problem: RH SAFETY Goal: RH STG ADHERE TO SAFETY PRECAUTIONS W/ASSISTANCE/DEVICE STG Adhere to Safety Precautions With mod. Assistance/Device.  Outcome: Progressing Goal: RH STG DECREASED RISK OF FALL WITH ASSISTANCE STG Decreased Risk of Fall With Assistance.  Outcome: Progressing

## 2014-04-24 NOTE — Progress Notes (Signed)
IP PROGRESS NOTE  Subjective:   He continues physical therapy. No difficulty with urination or having bowel movements. No other complaint.  Objective: Vital signs in last 24 hours: Blood pressure 130/72, pulse 62, temperature 98 F (36.7 C), temperature source Oral, resp. rate 18, height 6\' 4"  (1.93 m), weight 196 lb 3.2 oz (88.996 kg), SpO2 98 %.  Intake/Output from previous day: 11/16 0701 - 11/17 0700 In: 1440 [P.O.:1440] Out: 2475 [Urine:2475]  Physical Exam:not performed today  Lab Results:  PSA on 04/13/2014-4.21  Medications: I have reviewed the patient's current medications.  Assessment/Plan: 1. 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 Left frontal meningioma, WHO grade 1, status post resection 04/06/2014  3. Left leg weakness secondary to #1  4. 1 cm  brain lesion at the inferior falx noted on the MRI 03/21/2014, not resected  5.  Diabetes  6.  Hypertension  7.  Hyperlipidemia   Mr. Fruth appears stable. He is scheduled to begin radiation to the prostate and brain today. 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 We discussed the prognosis and treatment plan. Mr. Osei understands no therapy will be curative. The plan is to complete palliative radiation followed by etoposide/carboplatin chemotherapy. We will schedule an outpatient visit during the course of radiation to plan chemotherapy. He will also be scheduled for a chemotherapy teaching class.  Recommendations: 1. Continue physical therapy per Dr. Naaman Plummer 2. radiation therapy to the prostate and  brain per Dr. Tammi Klippel starting today 3. Outpatient follow-up at the [facility] we will be scheduled for within the next 2 weeks. 4. Please call oncology as needed.  LOS: 13 days   Tilia Faso  04/24/2014, 9:01 AM

## 2014-04-24 NOTE — Progress Notes (Signed)
Physical Therapy Note  Patient Details  Name: Ivan Stark MRN: [id number] Date of Birth: 1945/08/31 Today's Date: 04/24/2014  Attempted to see patient for 20 min make up session, however, patient had just completed 60 min therapy session and has another make up session in 20 min and states he would like to rest at this time. Will follow up as able.   [place] Hendrick Pavich, PT, DPT 04/24/2014, 10:09 AM

## 2014-04-24 NOTE — Progress Notes (Signed)
Physical Therapy Session Note  Patient Details  Name: Ivan Stark MRN: [id number] Date of Birth: 12-Nov-1945  Today's Date: 04/24/2014 PT Individual Time: Treatment Session 1: 0900-1000; Treatment Session 2: 1345-1445 PT Individual Time Calculation (min): Treatment Session 1: 60 min; Treatment Session 2: 60 min  Short Term Goals: Week 2:  PT Short Term Goal 1 (Week 2): STGs=LTGs due to anticipated LOS  Skilled Therapeutic Interventions/Progress Updates:  Treatment Session 1:  1:1. Pt received sitting in w/c, ready for therapy. Pt with no reports of pain. Focus this session on functional endurance during w/c propulsion, functional transfers and ambulation as well as NMR.  Pt req distant supervision for w/c propulsion 125'x1 and 175'x1 with B UE.   Pt practiced t/f sup<>sit on a standard bed with supervision, practiced again on elevated tx mat to simulate height of patients bed at home (30"- pt feels that his bed is slightly lower) with min guard A. Pt self-assisting L LE in/out of bed. Pt req min cues for donning/doffing AFO while seated EOB. Pt req supervision-min guard A for multiple furniture transfers in rehab apartment with RW, min cues for hand placement. Pt req close(S)-min guard A for ambulation 15'x2 in rehab apartment on carpet with RW as well as 50'x1 on hard level surface. Pt req min cues for increased foot clearance due to progressive fatigue and slide vs. Clearance.   Pt utilized NuStep to target B LE strength and coordination, good tolerance to level 3x68min.   Pt left sitting in w/c at end of session w/ all needs in reach.   Treatment Session 2: 1:1. Pt received sitting in recliner, ready for therapy. Pt denied pain. Pt reported feeling very fatigued, but agreeable to participate as able. Focus this session on functional endurance during w/c propulsion, ambulation and stair negotiation. Pt req supervision for w/c propulsion 175'x2 on unit as well as 300' off unit including  on/off elevators, in busy hospital lobby and outside. Pt req min A for ramp negotiation in both forwards and backwards directions.   Pt req consistent min A for ambulation 100'x1 with RW with manual facilitation for L LE advancement and foot clearance due to overall fatigue. Pt practiced negotiation up/down 3 steps x2 with B UE on single rail and step-to pattern with min A for L LE foot clearance as well as knee stability, mod cues for seq and safety.   Pt req frequent seated rest breaks throughout session due to fatigue, ongoing education regarding energy conservation and use of w/c in home environment when fatigued for increased safety. Pt verbalized understanding.   Pt req supervision for squat pivot t/f w/c>bed and t/f sit>sup at end of session. Pt left semi-reclined in bed w/ all needs in reach.   Therapy Documentation Precautions:  Precautions Precautions: Fall Precaution Comments: LLE hemiplegia Restrictions Weight Bearing Restrictions: No  See FIM for current functional status  Therapy/Group: Individual Therapy  Gilmore Laroche 04/24/2014, 12:02 PM

## 2014-04-24 NOTE — Telephone Encounter (Signed)
Rec'd a call from Raoul from [place]. Verified Carold Tiano's arrival time (4:55p) for 5:10p appointment today.

## 2014-04-24 NOTE — Progress Notes (Signed)
Occupational Therapy Session Note  Patient Details  Name: Ivan Stark MRN: [id number] Date of Birth: 01/18/1946  Today's Date: 04/24/2014 OT Individual Time: 0800-0900 OT Individual Time Calculation (min): 60 min    Short Term Goals: Week 2:  OT Short Term Goal 1 (Week 2): Pt will perform LB dressing sit<>stand with supervision OT Short Term Goal 2 (Week 2): Pt will perform toilet transfers with supervision OT Short Term Goal 3 (Week 2): Pt will perform toileting tasks (3/3) with supervision OT Short Term Goal 4 (Week 2): Pt will perform simple kitchen tasks at w/c level with supervision  Skilled Therapeutic Interventions/Progress Updates:    Pt engaged in BADL retraining including toilet transfers, toileting, bathing at shower level, and dressing with sit<>stand from w/c.  Pt continues to require min verbal cues for LLE placement in preparation for transitional movements.  Pt requires close superversion when standing to pull up pants.  Pt completed grooming tasks while standing at sink.  Focus on activity tolerance, dynamic standing balance, sit<>stand, transfers, and safety awareness.  Therapy Documentation Precautions:  Precautions Precautions: Fall Precaution Comments: LLE hemiplegia Restrictions Weight Bearing Restrictions: No  See FIM for current functional status  Therapy/Group: Individual Therapy  Leroy Libman 04/24/2014, 9:00 AM

## 2014-04-24 NOTE — Progress Notes (Addendum)
[facility] PHYSICAL MEDICINE & REHABILITATION     PROGRESS NOTE    Subjective/Complaints: No new complaints. Asked if wife could come with him to XRT today. Slept well. Wore PRAFO  ROS:  Left foot and ankle weakness Objective: Vital Signs: Blood pressure 130/72, pulse 62, temperature 98 F (36.7 C), temperature source Oral, resp. rate 18, height 6\' 4"  (1.93 m), weight 88.996 kg (196 lb 3.2 oz), SpO2 98 %. No results found. No results for input(s): WBC, HGB, HCT, PLT in the last 72 hours. No results for input(s): NA, K, CL, GLUCOSE, BUN, CREATININE, CALCIUM in the last 72 hours.  Invalid input(s): CO CBG (last 3)   Recent Labs  04/23/14 1647 04/23/14 2121 04/24/14 0705  GLUCAP 86 136* 83    Wt Readings from Last 3 Encounters:  04/18/14 88.996 kg (196 lb 3.2 oz)  04/06/14 94.8 kg (208 lb 15.9 oz)  03/29/14 95.981 kg (211 lb 9.6 oz)    Physical Exam:  Constitutional: He is oriented to person, place, and time. He appears well-developed.  HENT: oral mucosa pink and moist Craniotomy site with staples intact. Clean. Old blood around incision Eyes: EOM are normal.  Neck: Normal range of motion. Neck supple. No thyromegaly present.  Cardiovascular: Normal rate and regular rhythm.  Respiratory: Effort normal and breath sounds normal. No respiratory distress.  GI: Soft. Bowel sounds are normal. He exhibits no distension.  Neurological: He is alert and oriented to person, place, and time.  Follows commands. Good insight and awareness. Memory intact. Language normal. no CN deficits.  Right upper extremity 5/5 deltoid, biceps, triceps, grip, left upper extremity 4+/5 in the deltoid, bicep, tricep, grip. 4/5 right hip flexor knee extensor and ankle dorsiflexor plantar flexor. Left lower extremity  3-/5 hip flexor knee extensor, 2/5 HF.  tr   ankle dorsiflexor plantar flexor Sensation sl diminished to LT left  leg. Mood and affect are appropriate No left neglect. No  abnormal tone.  Skin: Skin is warm and dry.  Psych: mood remains pleasant and up beat  Assessment/Plan: 1. Functional deficits secondary to right frontal meningioma, metastatic lung cancer which require 3+ hours per day of interdisciplinary therapy in a comprehensive inpatient rehab setting. Physiatrist is providing close team supervision and 24 hour management of active medical problems listed below. Physiatrist and rehab team continue to assess barriers to discharge/monitor patient progress toward functional and medical goals.    FIM: FIM - Bathing Bathing Steps Patient Completed: Chest, Right Arm, Left upper leg, Left Arm, Right lower leg (including foot), Abdomen, Front perineal area, Buttocks, Right upper leg, Left lower leg (including foot) Bathing: 4: Steadying assist  FIM - Upper Body Dressing/Undressing Upper body dressing/undressing steps patient completed: Thread/unthread left sleeve of pullover shirt/dress, Pull shirt over trunk, Put head through opening of pull over shirt/dress, Thread/unthread right sleeve of pullover shirt/dresss Upper body dressing/undressing: 5: Set-up assist to: Obtain clothing/put away FIM - Lower Body Dressing/Undressing Lower body dressing/undressing steps patient completed: Thread/unthread right underwear leg, Thread/unthread left pants leg, Thread/unthread left underwear leg, Pull pants up/down, Don/Doff right shoe, Pull underwear up/down, Fasten/unfasten pants, Thread/unthread right pants leg, Don/Doff right sock, Fasten/unfasten right shoe, Don/Doff left sock, Fasten/unfasten left shoe Lower body dressing/undressing: 4: Min-Patient completed 75 plus % of tasks  FIM - Toileting Toileting steps completed by patient: Adjust clothing prior to toileting, Performs perineal hygiene, Adjust clothing after toileting Toileting Assistive Devices: Grab bar or rail for support Toileting: 4: Steadying assist  FIM - Toilet Transfers  Toilet Transfers Assistive  Devices: Elevated toilet seat, Grab bars Toilet Transfers: 4-To toilet/BSC: Min A (steadying Pt. > 75%), 4-From toilet/BSC: Min A (steadying Pt. > 75%)  FIM - Bed/Chair Transfer Bed/Chair Transfer Assistive Devices: Bed rails, HOB elevated, Walker, Arm rests Bed/Chair Transfer: 4: Sit > Supine: Min A (steadying pt. > 75%/lift 1 leg), 4: Chair or W/C > Bed: Min A (steadying Pt. > 75%), 4: Bed > Chair or W/C: Min A (steadying Pt. > 75%)  FIM - Locomotion: Wheelchair Distance: 150 Locomotion: Wheelchair: 5: Travels 150 ft or more: maneuvers on rugs and over door sills with supervision, cueing or coaxing FIM - Locomotion: Ambulation Locomotion: Ambulation Assistive Devices: Orthosis, Environmental consultant - Rolling, Lite Gait Ambulation/Gait Assistance: 4: Min assist, 1: +2 Total assist Locomotion: Ambulation: 1: Two helpers  Comprehension Comprehension Mode: Auditory Comprehension: 6-Follows complex conversation/direction: With extra time/assistive device  Expression Expression Mode: Verbal Expression: 7-Expresses complex ideas: With no assist  Social Interaction Social Interaction: 7-Interacts appropriately with others - No medications needed.  Problem Solving Problem Solving: 6-Solves complex problems: With extra time  Memory Memory Mode: Asleep Memory: 6-More than reasonable amt of time   Medical Problem List and Plan: 1. 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 DVT Prophylaxis/Anticoagulation: Pharmaceutical: Lovenox--  3. Pain Management: Will continue hydrocodone prn.  4. Mood: Reports anxiety/restlessness due to steroids.   low dose xanax prn. LCSW to follow for evaluation and support.  5. Neuropsych: This patient is capable of making decisions on his own behalf. 6. Skin/Wound Care: Routine pressure relief measures.  7. Fluids/Electrolytes/Nutrition: Monitor intake. Offer  supplements as needed to maintain adequate intake.  8. DM type 2: Monitor BS ac/hs  ON amaryl PTA---currently on metformin bid additionally.   resumed amaryl at 2mg  daily today--low am CBG d/c'ed levimir---continue to observe 9. Seizure prophylaxis: Continue Keppra bid.  10. HTN: Continue vasotec daily. Monitor BP every 8 hours and adjust as needed.   12 Constipation: moved bowels yesterday 13. Oncology: necrotic prostrate on CT-prostate bx'ed  by urology, small cell ca   -dr. Benay Spice following  -rad/onc---XRT today---14 treatments?  -path pending also on cecal extrinsic mass   LOS (Days) 13 A FACE TO FACE EVALUATION WAS PERFORMED  SWARTZ,ZACHARY T 04/24/2014 7:22 AM

## 2014-04-24 NOTE — Progress Notes (Signed)
Physical Therapy Session Note  Patient Details  Name: Ivan Stark MRN: [id number] Date of Birth: September 26, 1945  Today's Date: 04/24/2014 PT Individual Time: 1045-1100 (Scheduled makeup session) PT Individual Time Calculation (min): 15 min   Short Term Goals: Week 2:  PT Short Term Goal 1 (Week 2): STGs=LTGs due to anticipated LOS  Skilled Therapeutic Interventions/Progress Updates:    Scheduled make up session focusing on functional transfers, activity tolerance. No billable services provided during initial 15 minutes of scheduled session due to pt toileting at this time. Upon returning to pt room, provided min A with stand pivot transfer from toilet > w/ using grab bars. Pt then performed stand pivot from w/c > recliner with rolling walker and min A. Departed with pt seated in recliner with bilat LE's elevated and all needs within reach.  Therapy Documentation Precautions:  Precautions Precautions: Fall Precaution Comments: LLE hemiplegia Restrictions Weight Bearing Restrictions: No Vital Signs: Therapy Vitals Temp: 97.7 F (36.5 C) Temp Source: Oral Pulse Rate: 87 Resp: 18 BP: 115/62 mmHg Patient Position (if appropriate): Sitting Oxygen Therapy SpO2: 100 % O2 Device: Not Delivered Pain: Pain Assessment Pain Assessment: No/denies pain   See FIM for current functional status  Therapy/Group: Individual Therapy  Hobble, Malva Cogan 04/24/2014, 7:04 PM

## 2014-04-24 NOTE — Progress Notes (Signed)
[facility] Therapy Treatment Record Phone [phone]   Radiation Therapy was administered to Ivan Stark on: 04/24/2014  5:59 PM and was treatment # 1 out of a planned course of 14 treatments.

## 2014-04-24 NOTE — Plan of Care (Signed)
Problem: RH Ambulation Goal: LTG Patient will ambulate in home environment (PT) LTG: Patient will ambulate in home environment, # of feet with assistance (PT).  Downgraded due to anticipated level of physical assist.

## 2014-04-25 ENCOUNTER — Ambulatory Visit
Admit: 2014-04-25 | Discharge: 2014-04-25 | Disposition: A | Discharge status: Home / Self Care | Payer: Medicare Other | Attending: Radiation Oncology | Admitting: Radiation Oncology

## 2014-04-25 ENCOUNTER — Inpatient Hospital Stay (HOSPITAL_COMMUNITY): Payer: Medicare Other

## 2014-04-25 ENCOUNTER — Encounter (HOSPITAL_COMMUNITY): Payer: Medicare Other

## 2014-04-25 LAB — GLUCOSE, CAPILLARY
GLUCOSE-CAPILLARY: 83 mg/dL (ref 70–99)
Glucose-Capillary: 117 mg/dL — ABNORMAL HIGH (ref 70–99)
Glucose-Capillary: 251 mg/dL — ABNORMAL HIGH (ref 70–99)
Glucose-Capillary: 178 mg/dL — ABNORMAL HIGH (ref 70–99)

## 2014-04-25 NOTE — Progress Notes (Signed)
Social Work Patient ID: Ivan Stark, male   DOB: 09/14/1945, 68 y.o.   MRN: [id number]   Met with pt and wife yesterday following team conference.  Both feel ready for d/c end of week.  Ramp to begin build today.  Reviewed DME and follow up therapies I am arranging and wife was agreed to be in for family ed this morning.  Will continue to follow for support and d/c planning needs.  Race Latour, LCSW

## 2014-04-25 NOTE — Plan of Care (Signed)
Problem: RH BOWEL ELIMINATION Goal: RH STG MANAGE BOWEL WITH ASSISTANCE STG Manage Bowel with min Assistance.  Outcome: Progressing LBM 11/18 Goal: RH STG MANAGE BOWEL W/MEDICATION W/ASSISTANCE STG Manage Bowel with Medication with min. Assistance.  Outcome: Progressing  Problem: RH SKIN INTEGRITY Goal: RH STG SKIN FREE OF INFECTION/BREAKDOWN Skin to remain free from infection and breakdown while on rehab With min. assist  Outcome: Progressing Goal: RH STG MAINTAIN SKIN INTEGRITY WITH ASSISTANCE STG Maintain Skin Integrity With min.Assistance.  Outcome: Progressing  Problem: RH SAFETY Goal: RH STG ADHERE TO SAFETY PRECAUTIONS W/ASSISTANCE/DEVICE STG Adhere to Safety Precautions With mod. Assistance/Device.  Outcome: Progressing Goal: RH STG DECREASED RISK OF FALL WITH ASSISTANCE STG Decreased Risk of Fall With Assistance.  Outcome: Progressing

## 2014-04-25 NOTE — Progress Notes (Signed)
Physical Therapy Note  Patient Details  Name: Ivan Stark MRN: [id number] Date of Birth: 09/04/1945 Today's Date: 04/25/2014    At scheduled therapy time pt supine in bed, declining bed level and out of bed therapy. Pt felt fatigued from therapy sessions this AM and requested time to rest as transportation was coming soon to take him for radiation treatment. Pt missed 60 minutes scheduled PT time.   Rada Hay 04/25/2014, 3:32 PM

## 2014-04-25 NOTE — Progress Notes (Signed)
Physical Therapy Session Note  Patient Details  Name: Ivan Stark MRN: [id number] Date of Birth: 07/24/1945  Today's Date: 04/25/2014 PT Individual Time: 0900-1000 PT Individual Time Calculation (min): 60 min   Short Term Goals: Week 2:  PT Short Term Goal 1 (Week 2): STGs=LTGs due to anticipated LOS  Skilled Therapeutic Interventions/Progress Updates:  1:1. Pt received sitting in w/c, ready for therapy with wife at side. Focus this session on family training with pt's wife and safety during functional transfers and mobility. Pt and wife educated on energy conservation- use of w/c vs. RW, fall prevention and goals of HH PT.   Pt able demonstrate w/c propulsion 150'x2 with B UE at supervision level. Following demonstration by therapist, pt's wife assisting pt with supervision during both SPT with RW and squat pivot t/f elevated bed<>w/c and w/c<>car, supervision-min A for t/f sup<>sit on a regular bed. Pt's wife instructed on management of w/c for transportation purposes. Therapist providing supervision-min guard A for ambulation 75'x1 with RW. Pt req intermittent verbal cues for increased L LE clearance. Education provided regarding benefits of AFO as well as fit in shoe. Both pt and wife verbalized understanding of all education this session, however, would benefit from continued family training.   Pt left sitting in w/c at end of session w/ all needs in reach and wife in room.   Therapy Documentation Precautions:  Precautions Precautions: Fall Precaution Comments: LLE hemiplegia Restrictions Weight Bearing Restrictions: No  See FIM for current functional status  Therapy/Group: Individual Therapy  Gilmore Laroche 04/25/2014, 10:11 AM

## 2014-04-25 NOTE — Progress Notes (Signed)
Occupational Therapy Session Note  Patient Details  Name: Ivan Stark MRN: [id number] Date of Birth: 05/23/1946  Today's Date: 04/25/2014 OT Individual Time: 1000-1100 OT Individual Time Calculation (min): 60 min    Short Term Goals: Week 1:  OT Short Term Goal 1 (Week 1): Bath: performed in sit and stand with steady assist and use of LH sponge PRN. OT Short Term Goal 1 - Progress (Week 1): Met OT Short Term Goal 2 (Week 1): LB dressing: min assist to include sit and stand and AE PRN OT Short Term Goal 2 - Progress (Week 1): Met OT Short Term Goal 3 (Week 1): Toilet transfer: Min assist  OT Short Term Goal 3 - Progress (Week 1): Met OT Short Term Goal 4 (Week 1): Toileting: Min assist OT Short Term Goal 4 - Progress (Week 1): Met OT Short Term Goal 5 (Week 1): Simple kitchen tasks: Supervision at a w/c level OT Short Term Goal 5 - Progress (Week 1): Progressing toward goal  Skilled Therapeutic Interventions/Progress Updates:    Pt resting in w/c upon arrival with wife present.  Discussed accessing bathroom at home.  Discussed recommendation that patient don AFO before walking into bathroom for shower and again when walking out of bathroom. Pt and wife verbalized understanding and rationale.  Pt stated he didn't want to access shower this morning by walking and wanted to use w/c because he wanted to make sure he was ready for transport to radiation.  Pt and wife stated that he was upset because he had PT before taking shower this morning.  Explained that adjustments would be made to schedule to accommodate.  Pt completed toileting, bathing, and dressing with supervision this morning.  Pt continues to required min verbal cues for LLE placement in preparation for transfers.  Recommended that patient not take shower at home until home health therapists have made recommendations based on home setup.  Focus on activity tolerance, family education, discharge planning, transfers, sit<>stand,  standing balance, and safety awareness.  Therapy Documentation Precautions:  Precautions Precautions: Fall Precaution Comments: LLE hemiplegia Restrictions Weight Bearing Restrictions: No Pain: Pain Assessment Pain Assessment: No/denies pain  See FIM for current functional status  Therapy/Group: Individual Therapy  Leroy Libman 04/25/2014, 11:04 AM

## 2014-04-25 NOTE — Patient Care Conference (Signed)
Inpatient RehabilitationTeam Conference and Plan of Care Update Date: 04/24/2014   Time: 2:46 PM    Patient Name: Ivan Stark      Medical Record Number: [id number]  Date of Birth: 1946-03-17 Sex: Male         Room/Bed: 4M07C/4M07C-01 Payor Info: Payor: [employer] [place] MEDICARE / Plan: BLUE MEDICARE / Product Type: *No Product type* /    Admitting Diagnosis: tumor resection  Crani GI bleed abnormal CT  Admit Date/Time:  04/11/2014  4:44 PM Admission Comments: No comment available   Primary Diagnosis:  <principal problem not specified> Principal Problem: <principal problem not specified>  Patient Active Problem List   Diagnosis Date Noted  . Small cell carcinoma   . Vision disturbance   . Metastatic cancer to brain 04/11/2014  . Cerebral meningioma   . Left flaccid hemiparesis   . Left hemiparesis 04/09/2014  . 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 Poor compliance with F/U 02/04/2014  . 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 Encounter for long-term (current) use of other medications 08/20/2013  . Hypertension   . Hyperlipidemia   . GERD (gastroesophageal reflux disease)   . Vitamin D deficiency   . History of colon polyps     Expected Discharge Date: Expected Discharge Date: 04/28/14  Team Members Present: Physician leading conference: Dr. Alger Simons Social Worker Present: Lennart Pall, LCSW Nurse Present: Rayetta Humphrey, RN PT Present: Otis Brace, PT;Bridgett Ripa, PT OT Present: Willeen Cass, OT;Roanna Epley, Griffin Basil, OT SLP Present: Weston Anna, SLP PPS Coordinator present : Daiva Nakayama, RN, CRRN     Current Status/Progress Goal Weekly Team Focus  Medical   onc work up ongoing. likely metastatic lesion in abdomen. remains motivated. using afo. starts XRT today  finalize dc planning  pain control, xrt   Bowel/Bladder   Continent of bowel and bladder  To remain continent to bowel and bladder.  Monitor intake and output q shift    Swallow/Nutrition/ Hydration             ADL's   transfers-close supervision; LB bathing/dressing-steady A/close S; min verbal cues for safety awareness with transfers  supervision overall  family educaiton; transfers; standing balance; safety awareness   Mobility   Supervision for w/c propulsion, (S)-min A for transfers, ambulation up to 24' with RW, Min A for stair negotiation  Supervision overall, Min A for household amb and stairs  functional endurance, transfers, ambulation, stiars, w/c propulsion, L LE NMR, standing balance, safety awareness, d/c planning   Communication             Safety/Cognition/ Behavioral Observations            Pain   No complaints of pain  <3 on a 0-10 scake  Assess pain q shift and after each PRN medication intervention   Skin   Incision to scalp dry and intact. No skin breakdown  To keep incision clean and dry,free from infections.  Monitor skin q shift    Rehab Goals Patient on target to meet rehab goals: Yes *See Care Plan and progress notes for long and short-term goals.  Barriers to Discharge: onc issues, see prior    Possible Resolutions to Barriers:  see prior, family ed    Discharge Planning/Teaching Needs:  home with wife who is prepared to provide any assistance needed      Team Discussion:  Doing well overall with therapies.  Still awaiting some path reports.  Tearful at times.  No concerns about reaching targeted goals.   Planning family ed for tomorrow  Revisions to Treatment Plan:  None   Continued Need for Acute Rehabilitation Level of Care: The patient requires daily medical management by a physician with specialized training in physical medicine and rehabilitation for the following conditions: Daily direction of a multidisciplinary physical rehabilitation program to ensure safe treatment while eliciting the highest outcome that is of practical value to the patient.: Yes Daily medical management of patient stability for  increased activity during participation in an intensive rehabilitation regime.: Yes Daily analysis of laboratory values and/or radiology reports with any subsequent need for medication adjustment of medical intervention for : Post surgical problems;Neurological problems;Other  Danaysha Kirn, [place] 04/25/2014, 2:46 PM

## 2014-04-25 NOTE — Progress Notes (Signed)
[facility]     PROGRESS NOTE    Subjective/Complaints: Tired from long day yesterday! Hypogastric area a little sore today. Otherwise in good spirits  ROS:  Left foot and ankle weakness present Objective: Vital Signs: Blood pressure 110/50, pulse 70, temperature 98 F (36.7 C), temperature source Oral, resp. rate 18, height 6\' 4"  (1.93 m), weight 89.4 kg (197 lb 1.5 oz), SpO2 99 %. No results found. No results for input(s): WBC, HGB, HCT, PLT in the last 72 hours. No results for input(s): NA, K, CL, GLUCOSE, BUN, CREATININE, CALCIUM in the last 72 hours.  Invalid input(s): CO CBG (last 3)   Recent Labs  04/24/14 1201 04/24/14 2043 04/25/14 0649  GLUCAP 71 276* 83    Wt Readings from Last 3 Encounters:  04/25/14 89.4 kg (197 lb 1.5 oz)  04/06/14 94.8 kg (208 lb 15.9 oz)  03/29/14 95.981 kg (211 lb 9.6 oz)    Physical Exam:  Constitutional: He is oriented to person, place, and time. He appears well-developed.  HENT: oral mucosa pink and moist Craniotomy site with staples intact. Clean. Old blood around incision Eyes: EOM are normal.  Neck: Normal range of motion. Neck supple. No thyromegaly present.  Cardiovascular: Normal rate and regular rhythm.  Respiratory: Effort normal and breath sounds normal. No respiratory distress.  GI: Soft. Bowel sounds are normal. He exhibits no distension.  Neurological: He is alert and oriented to person, place, and time.  Follows commands. Good insight and awareness. Memory intact. Language normal. no CN deficits.  Right upper extremity 5/5 deltoid, biceps, triceps, grip, left upper extremity 4+/5 in the deltoid, bicep, tricep, grip. 4/5 right hip flexor knee extensor and ankle dorsiflexor plantar flexor. Left lower extremity  3-/5 hip flexor knee extensor, 2/5 HF and trace ankle dorsiflexor plantar flexor Sensation sl diminished to LT left  leg. Mood and affect are appropriate No left  neglect. No abnormal tone.  Skin: Skin is warm and dry.  Psych: mood remains pleasant and up beat  Assessment/Plan: 1. Functional deficits secondary to right frontal meningioma, metastatic lung cancer which require 3+ hours per day of interdisciplinary therapy in a comprehensive inpatient rehab setting. Physiatrist is providing close team supervision and 24 hour management of active medical problems listed below. Physiatrist and rehab team continue to assess barriers to discharge/monitor patient progress toward functional and medical goals.    FIM: FIM - Bathing Bathing Steps Patient Completed: Chest, Right Arm, Left upper leg, Left Arm, Right lower leg (including foot), Abdomen, Front perineal area, Buttocks, Right upper leg, Left lower leg (including foot) Bathing: 5: Supervision: Safety issues/verbal cues  FIM - Upper Body Dressing/Undressing Upper body dressing/undressing steps patient completed: Thread/unthread left sleeve of pullover shirt/dress, Pull shirt over trunk, Put head through opening of pull over shirt/dress, Thread/unthread right sleeve of pullover shirt/dresss Upper body dressing/undressing: 6: More than reasonable amount of time FIM - Lower Body Dressing/Undressing Lower body dressing/undressing steps patient completed: Thread/unthread right underwear leg, Thread/unthread left pants leg, Thread/unthread left underwear leg, Pull pants up/down, Don/Doff right shoe, Pull underwear up/down, Fasten/unfasten pants, Thread/unthread right pants leg, Don/Doff right sock, Fasten/unfasten right shoe, Don/Doff left sock, Fasten/unfasten left shoe Lower body dressing/undressing: 4: Min-Patient completed 75 plus % of tasks  FIM - Toileting Toileting steps completed by patient: Adjust clothing prior to toileting, Performs perineal hygiene, Adjust clothing after toileting Toileting Assistive Devices: Grab bar or rail for support Toileting: 4: Steadying assist  FIM - Toilet  Transfers  Toilet Transfers Assistive Devices: Elevated toilet seat, Grab bars Toilet Transfers: 4-To toilet/BSC: Min A (steadying Pt. > 75%), 4-From toilet/BSC: Min A (steadying Pt. > 75%)  FIM - Control and instrumentation engineer Devices: Arm rests, Engineer, mining Transfer: 4: Bed > Chair or W/C: Min A (steadying Pt. > 75%), 4: Chair or W/C > Bed: Min A (steadying Pt. > 75%)  FIM - Locomotion: Wheelchair Distance: 150 Locomotion: Wheelchair: 5: Travels 150 ft or more: maneuvers on rugs and over door sills with supervision, cueing or coaxing FIM - Locomotion: Ambulation Locomotion: Ambulation Assistive Devices: Orthosis, Administrator Ambulation/Gait Assistance: 4: Min guard, 5: Supervision Locomotion: Ambulation: 1: Travels less than 50 ft with minimal assistance (Pt.>75%)  Comprehension Comprehension Mode: Auditory Comprehension: 6-Follows complex conversation/direction: With extra time/assistive device  Expression Expression Mode: Verbal Expression: 7-Expresses complex ideas: With no assist  Social Interaction Social Interaction: 7-Interacts appropriately with others - No medications needed.  Problem Solving Problem Solving: 6-Solves complex problems: With extra time  Memory Memory Mode: Asleep Memory: 6-More than reasonable amt of time   Medical Problem List and Plan: 1. 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 DVT Prophylaxis/Anticoagulation: Pharmaceutical: Lovenox--  3. Pain Management: Will continue hydrocodone prn.  4. Mood: Reports anxiety/restlessness due to steroids.   low dose xanax prn. LCSW to follow for evaluation and support.  5. Neuropsych: This patient is capable of making decisions on his own behalf. 6. Skin/Wound Care: Routine pressure relief measures.  7. Fluids/Electrolytes/Nutrition: Monitor intake. Offer  supplements as needed to maintain adequate intake.  8. DM type 2: Monitor BS ac/hs  ON amaryl PTA---currently on metformin bid additionally.   resumed amaryl at 2mg  daily today--  -levemir dc'ed. Likely higher cbg last night due to birthday party! 9. Seizure prophylaxis: Continue Keppra bid.  10. HTN: Continue vasotec daily. Monitor BP every 8 hours and adjust as needed.   12 Constipation: moved bowels yesterday 13. Oncology:small cell prostate cancer  -dr. Benay Spice following, outpt ctx  -rad/onc---XRT today---14 treatments in total  -path pending also on cecal extrinsic mass   LOS (Days) 14 A FACE TO FACE EVALUATION WAS PERFORMED  SWARTZ,ZACHARY T 04/25/2014 7:32 AM

## 2014-04-25 NOTE — Plan of Care (Signed)
Problem: RH SAFETY Goal: RH STG ADHERE TO SAFETY PRECAUTIONS W/ASSISTANCE/DEVICE STG Adhere to Safety Precautions With mod. Assistance/Device.  Outcome: Progressing

## 2014-04-26 ENCOUNTER — Telehealth: Payer: Self-pay | Admitting: Nurse Practitioner

## 2014-04-26 ENCOUNTER — Encounter (HOSPITAL_COMMUNITY): Payer: Medicare Other | Admitting: Occupational Therapy

## 2014-04-26 ENCOUNTER — Ambulatory Visit: Payer: Medicare Other

## 2014-04-26 ENCOUNTER — Other Ambulatory Visit: Payer: Self-pay | Admitting: *Deleted

## 2014-04-26 ENCOUNTER — Inpatient Hospital Stay (HOSPITAL_COMMUNITY): Payer: Medicare Other

## 2014-04-26 ENCOUNTER — Telehealth: Payer: Self-pay | Admitting: *Deleted

## 2014-04-26 ENCOUNTER — Inpatient Hospital Stay (HOSPITAL_COMMUNITY): Payer: Medicare Other | Admitting: Rehabilitation

## 2014-04-26 LAB — GLUCOSE, CAPILLARY
GLUCOSE-CAPILLARY: 189 mg/dL — AB (ref 70–99)
Glucose-Capillary: 135 mg/dL — ABNORMAL HIGH (ref 70–99)
Glucose-Capillary: 89 mg/dL (ref 70–99)
Glucose-Capillary: 126 mg/dL — ABNORMAL HIGH (ref 70–99)
Glucose-Capillary: 140 mg/dL — ABNORMAL HIGH (ref 70–99)

## 2014-04-26 NOTE — Plan of Care (Signed)
Problem: RH BOWEL ELIMINATION Goal: RH STG MANAGE BOWEL WITH ASSISTANCE STG Manage Bowel with min Assistance.  Outcome: Progressing Goal: RH STG MANAGE BOWEL W/MEDICATION W/ASSISTANCE STG Manage Bowel with Medication with min. Assistance.  Outcome: Progressing  Problem: RH SKIN INTEGRITY Goal: RH STG SKIN FREE OF INFECTION/BREAKDOWN Skin to remain free from infection and breakdown while on rehab With min. assist  Outcome: Progressing Goal: RH STG MAINTAIN SKIN INTEGRITY WITH ASSISTANCE STG Maintain Skin Integrity With min.Assistance.  Outcome: Progressing  Problem: RH SAFETY Goal: RH STG ADHERE TO SAFETY PRECAUTIONS W/ASSISTANCE/DEVICE STG Adhere to Safety Precautions With mod. Assistance/Device.  Outcome: Progressing Goal: RH STG DECREASED RISK OF FALL WITH ASSISTANCE STG Decreased Risk of Fall With Assistance.  Outcome: Progressing

## 2014-04-26 NOTE — Progress Notes (Signed)
Physical Therapy Session Note  Patient Details  Name: Ivan Stark MRN: [id number] Date of Birth: [birth year]  Today's Date: 04/26/2014 PT Individual Time: 1113-1200 PT Individual Time Calculation (min): 47 min   Short Term Goals: Week 3:     Skilled Therapeutic Interventions/Progress Updates:   Pt received using restroom, requesting time to finish tolieting, therefore missed 13 mins at beginning of session.  Attempted to make up time at end of session, however pt extremely fatigued and states "I'm done."  Assisted pt in standing with min/guard assist while he was able to adjust clothing.  Transferred into w/c with min/guard assist.  Pt propelled to sink to wash/dry hands.  Propelled to therapy gym at S level with [place].  Discussed D/C home Saturday and the progress that he has made and getting HHPT when he gets home.  Pt verbalized understanding.  Skilled session focused on NMR in tall kneeling with reaching activity to the L for increased weight shift to the L, increased glute contraction, as well as increased trunk rotation.  Pt tolerated well with intermittent rest breaks due to fatigue.  Pt feeling nauseated at end of activity, therefore assisted into sitting.  Pt discussed how his blood sugars are lower now than they used to be and that this could be reason for feeling bad.  PT also educated that activity could decrease blood sugar (noted to be 89 at beginning of session).  Ended session with seated nustep x 10 mins with BUEs/LEs initially progressing to LEs only for increased NMR through LLE at level 3-4 resistance.  Note pt ambulated from therapy mat to nustep with RW at min/guard level.  Note increased difficulty advancing LLE today, despite having AFO and toe cap.  Following nustep, pt exhausted and requesting to return to room.  Pt transferred back to w/c and then back to bed at S level via squat pivot.  Pt left in bed with all needs in reach.     Therapy Documentation Precautions:   Precautions Precautions: Fall Precaution Comments: LLE hemiplegia Restrictions Weight Bearing Restrictions: No General: PT Amount of Missed Time (min): 13 Minutes PT Missed Treatment Reason: Toileting;Patient fatigue:   Pain: Pain Assessment Pain Score: 0-No pain   Locomotion : Ambulation Ambulation/Gait Assistance: 4: Min guard   See FIM for current functional status  Therapy/Group: Individual Therapy  Denice Bors 04/26/2014, 12:37 PM

## 2014-04-26 NOTE — Discharge Summary (Signed)
  Physician Discharge Summary  Patient ID: Ivan Stark MRN: [id number] DOB/AGE: 67/05/1946 67 y.o.  Admit date: 04/06/2014 Discharge date: 04/26/2014  Admission Diagnoses: Extra-axial brain tumors  Discharge Diagnoses:  Metastatic brain tumor Active Problems:   Extra-axial brain tumor   Left hemiparesis   Discharged Condition: Stable  Hospital Course:  Mrs. Ivan Stark is a 68 y.o. male admitted electively after undergoing bilateral craniotomies for resection of extra-axial tumors.  Postoperatively, the patient was transferred to the neuro intensive care unit where he did have primarily left leg weakness.  After being observed for a few days in the ICU, he remained hemodynamically stable, with modest improvements in his left leg strength.  He was transferred to the general neurosciences floor, where he began work with physical and occupational therapy.  After these evaluations, he was deemed a good candidate for comprehensive inpatient rehabilitation.  He remained neurologically well, with slowly but steadily improving left leg strength.  He had minimal headache.  Prior to his discharge, pathology results returned one tumor on the left as metastatic small cell carcinoma, while the other was WHO grade 1 meningioma.  With these results, the patient was transferred to inpatient rehabilitation with the plan to obtain oncology and radiation oncology consults for further workup.  Treatments: Surgery - bilateral craniotomies for resection of extra-axial tumors  Discharge Exam: Blood pressure 141/67, pulse 80, temperature 97.8 F (36.6 C), temperature source Oral, resp. rate 18, height 6\' 4"  (1.93 m), weight 94.8 kg (208 lb 15.9 oz), SpO2 99 %. Awake, alert, oriented Speech fluent, appropriate CN grossly intact 5/5 BUE/RLE 2-3/4 proximal LLE 1/5 distal LLE Wound c/d/i  Follow-up: Follow-up in my office [facility] [phone]) in 4-6 weeks  Disposition: 62-Rehab  Facility     Medication List    TAKE these medications        aspirin EC 81 MG tablet  Take 81 mg by mouth daily.     dexamethasone 4 MG tablet  Commonly known as:  DECADRON  Take 4 mg by mouth 2 (two) times daily.     enalapril 20 MG tablet  Commonly known as:  VASOTEC  Take 20 mg by mouth daily.     glimepiride 4 MG tablet  Commonly known as:  AMARYL  Take 2 mg by mouth 2 (two) times daily.     metFORMIN 500 MG 24 hr tablet  Commonly known as:  GLUCOPHAGE-XR  Take 500 mg by mouth 3 (three) times daily. 500mg  in a.m., 500mg . At lunch, 1000mg . In the evening     multivitamin with minerals Tabs tablet  Take 1 tablet by mouth daily.     pravastatin 40 MG tablet  Commonly known as:  PRAVACHOL  Take 1 tablet (40 mg total) by mouth at bedtime.     vitamin C 1000 MG tablet  Take 1,000 mg by mouth daily.     Vitamin D 2000 UNITS Caps  Take 4,000 Units by mouth daily.         SignedConsuella Lose, C 04/26/2014, 4:45 PM

## 2014-04-26 NOTE — Progress Notes (Signed)
Received call from Lurena Joiner stating patient "will be a little late" for his treatment today.  Verbally notified Raquel Sarna,  linac 2 therapist.

## 2014-04-26 NOTE — Telephone Encounter (Signed)
ML visit per 11/19 POF pt is aware and is being released from hospital on 11/20.... KJ

## 2014-04-26 NOTE — Progress Notes (Signed)
Occupational Therapy Session Note  Patient Details  Name: Ivan Stark MRN: [id number] Date of Birth: 04/07/1946  Today's Date: 04/26/2014 OT Individual Time: 3643-8377 OT Individual Time Calculation (min): 49 min  Missed time: 11 min (fatigue)   Short Term Goals: Week 2:  OT Short Term Goal 1 (Week 2): Pt will perform LB dressing sit<>stand with supervision OT Short Term Goal 2 (Week 2): Pt will perform toilet transfers with supervision OT Short Term Goal 3 (Week 2): Pt will perform toileting tasks (3/3) with supervision OT Short Term Goal 4 (Week 2): Pt will perform simple kitchen tasks at w/c level with supervision  Skilled Therapeutic Interventions/Progress Updates:    Pt seen for 1:1 OT session with focus on functional mobility in ADL apartment, sit<>stand, and activity tolerance. Pt received sitting in w/c. Propelled self to ADL apartment in w/c. Completed side stepping L<>R on carpet in ADL apartment to simulate home environment. Pt quickly fatiguing during activity, requiring multiple rest breaks. Educated on completing "dry run" of functional transfers around home (toilet, tub, etc) to ensure safety and allow time for problem solving if needed. Provided RW bag and educated on use of w/c at times for energy conservation and safety. Completed simple meal prep task from w/c level and ambulating short distances with RW. Pt fatiguing with L knee buckling 2x. Pt propelled self back to room and completed squat pivot transfer w/c>toilet at min assist and min cues for positioning of w/c. Pt stood for clothing management at supervision level with use of grab bar for support. Pt requesting to rest before PT session immediately scheduled after OT. Encouraged participation from w/c however pt continued to request to "rest." Pt left sitting in w/c with all needs in reach. Will follow-up in PM for missed 11 min.   Attempted makeup of missed minutes in PM, however pt asleep.   Therapy  Documentation Precautions:  Precautions Precautions: Fall Precaution Comments: LLE hemiplegia Restrictions Weight Bearing Restrictions: No General:   Vital Signs:   Pain: No report of pain  See FIM for current functional status  Therapy/Group: Individual Therapy  Duayne Cal 04/26/2014, 10:55 AM

## 2014-04-26 NOTE — Consult Note (Signed)
NEUROCOGNITIVE STATUS EXAMINATION - [facility]   [name] is a 68 year old man, who was seen for a brief neurocognitive status examination to evaluate his emotional state and mental status in the setting of meningiomas, which were found to be metastases.  According to his medical record, he began experiencing gait instability with left lower extremity weakness in July, 2015 and MRI of the brain revealed three intracranial tumors consistent with meningioma and right sided lesion with edema.  He was admitted on 04/06/2014 for bilateral frontoparietal craniotomy with resection of left and right frontal tumor.  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    Emotional Functioning:  During the clinical interview, [name] stated that in general, things are "going well."  He reported feeling encouraged that his physicians are working on getting a plan together and have been very positive about their hopes for treatment success.  He also felt encouraged that his body seems to have responded well to surgery.  [name] denied any extended periods of depressed mood and he denied suicidal ideation.  He commented that he is "ready to accept the Lord's will," that he feels as though this is a test of his faith and that he is using his faith to cope during this period.  He also cited having significant support from his wife, other family members, and their community as helping him to get through this time.  [name] mentioned that he had a fear of falling during physical therapy for a while, but he said that his fears are reduced now.  One thing that he mentioned though, was that it is easier for him to keep his mood elevated when he has periods for "mental recuperation" following therapy sessions.  [name] responses to self-report  measures of mood symptoms were not suggestive of the presence of clinically significant depression or anxiety at this time.    Mental Status:  [name] total score on an overall measure of mental status was not suggestive of the presence of significant cognitive disruption (MMSE-2 brief = 14/16).  He lost points for misstating the date and for failing to freely recall all of 3 previously studied words after a brief delay.  Subjectively, he denied noticing cognitive changes.    Impressions and Recommendations:  [name] neurocognitive screening did not yield a score that was suggestive of marked cognitive impairment.  However, given the risks for cognitive decline following radiation to the brain and chemotherapy, information on a neuropsychologist in his area should be included in his discharge paperwork so that he can have that resource readily available.  If he notices functional cognitive difficulties post-discharge, or post-treatment for chemotherapy, a formal neuropsychological evaluation as an outpatient could be conducted.  From an emotional standpoint, [name] seems to be coping well with his difficult medical situation.  Still, follow-up for support could be provided should his treatment team feel that it would be beneficial.  Risks for development of depression were discussed with [name], as were symptoms that are warning signs of clinical depression.  He was encouraged to tell his physicians if he noticed that he was developing symptoms of more serious depression.  Given [name] report of benefit from periods of recuperation, staff may try to schedule him with several breaks between therapy sessions during his stay on the inpatient unit in order to optimize his  participation and maintain his optimistic perspective.    DIAGNOSIS: Metastatic cancer to brain  Marlane Ivan Stark, Psy.D.  Clinical Neuropsychologist

## 2014-04-26 NOTE — Progress Notes (Signed)
[facility] PHYSICAL MEDICINE & REHABILITATION     PROGRESS NOTE    Subjective/Complaints: No new issues. Up early to have bm this morning. Tolerating xrt thus far  ROS:  Left foot and ankle weakness present Objective: Vital Signs: Blood pressure 120/69, pulse 80, temperature 98.1 F (36.7 C), temperature source Oral, resp. rate 18, height 6\' 4"  (1.93 m), weight 89.4 kg (197 lb 1.5 oz), SpO2 99 %. No results found. No results for input(s): WBC, HGB, HCT, PLT in the last 72 hours. No results for input(s): NA, K, CL, GLUCOSE, BUN, CREATININE, CALCIUM in the last 72 hours.  Invalid input(s): CO CBG (last 3)   Recent Labs  04/25/14 1717 04/25/14 2032 04/26/14 0644  GLUCAP 251* 178* 135*    Wt Readings from Last 3 Encounters:  04/25/14 89.4 kg (197 lb 1.5 oz)  04/06/14 94.8 kg (208 lb 15.9 oz)  03/29/14 95.981 kg (211 lb 9.6 oz)    Physical Exam:  Constitutional: He is oriented to person, place, and time. He appears well-developed.  HENT: oral mucosa pink and moist Craniotomy site with staples intact. Clean. Old blood around incision Eyes: EOM are normal.  Neck: Normal range of motion. Neck supple. No thyromegaly present.  Cardiovascular: Normal rate and regular rhythm.  Respiratory: Effort normal and breath sounds normal. No respiratory distress.  GI: Soft. Bowel sounds are normal. He exhibits no distension.  Neurological: He is alert and oriented to person, place, and time.  Follows commands. Good insight and awareness. Memory intact. Language normal. no CN deficits.  Right upper extremity 5/5 deltoid, biceps, triceps, grip, left upper extremity 4+/5 in the deltoid, bicep, tricep, grip. 4/5 right hip flexor knee extensor and ankle dorsiflexor plantar flexor. Left lower extremity  3-/5 hip flexor knee extensor, 2/5 HF and trace ankle dorsiflexor plantar flexor Sensation sl diminished to LT left  leg. Mood and affect are appropriate No left neglect. No abnormal  tone.  Skin: Skin is warm and dry.  Psych: mood remains pleasant and up beat  Assessment/Plan: 1. Functional deficits secondary to right frontal meningioma, metastatic lung cancer which require 3+ hours per day of interdisciplinary therapy in a comprehensive inpatient rehab setting. Physiatrist is providing close team supervision and 24 hour management of active medical problems listed below. Physiatrist and rehab team continue to assess barriers to discharge/monitor patient progress toward functional and medical goals.    FIM: FIM - Bathing Bathing Steps Patient Completed: Chest, Right Arm, Left upper leg, Left Arm, Right lower leg (including foot), Abdomen, Front perineal area, Buttocks, Right upper leg, Left lower leg (including foot) Bathing: 5: Supervision: Safety issues/verbal cues  FIM - Upper Body Dressing/Undressing Upper body dressing/undressing steps patient completed: Thread/unthread left sleeve of pullover shirt/dress, Pull shirt over trunk, Put head through opening of pull over shirt/dress, Thread/unthread right sleeve of pullover shirt/dresss Upper body dressing/undressing: 6: More than reasonable amount of time FIM - Lower Body Dressing/Undressing Lower body dressing/undressing steps patient completed: Thread/unthread right underwear leg, Thread/unthread left pants leg, Thread/unthread left underwear leg, Pull pants up/down, Don/Doff right shoe, Pull underwear up/down, Fasten/unfasten pants, Thread/unthread right pants leg, Don/Doff right sock, Fasten/unfasten right shoe, Don/Doff left sock, Fasten/unfasten left shoe, Don/Doff left shoe Lower body dressing/undressing: 5: Supervision: Safety issues/verbal cues (sit<>stand position)  FIM - Toileting Toileting steps completed by patient: Adjust clothing prior to toileting, Performs perineal hygiene, Adjust clothing after toileting Toileting Assistive Devices: Grab bar or rail for support Toileting: 5: Supervision: Safety  issues/verbal cues (min verbal  cues for w/c set up prior to transfer)  FIM - Radio producer Devices: Elevated toilet seat, Grab bars Toilet Transfers: 5-To toilet/BSC: Supervision (verbal cues/safety issues), 5-From toilet/BSC: Supervision (verbal cues/safety issues)  FIM - Engineer, site Assistive Devices: Bed rails, HOB elevated, Walker, Arm rests Bed/Chair Transfer: 5: Supine > Sit: Supervision (verbal cues/safety issues), 5: Bed > Chair or W/C: Supervision (verbal cues/safety issues)  FIM - Locomotion: Wheelchair Distance: 150 Locomotion: Wheelchair: 5: Travels 150 ft or more: maneuvers on rugs and over door sills with supervision, cueing or coaxing FIM - Locomotion: Ambulation Locomotion: Ambulation Assistive Devices: Orthosis, Administrator Ambulation/Gait Assistance: 4: Min guard, 5: Supervision Locomotion: Ambulation: 2: Travels 50 - 149 ft with minimal assistance (Pt.>75%)  Comprehension Comprehension Mode: Auditory Comprehension: 6-Follows complex conversation/direction: With extra time/assistive device  Expression Expression Mode: Verbal Expression: 6-Expresses complex ideas: With extra time/assistive device  Social Interaction Social Interaction: 6-Interacts appropriately with others with medication or extra time (anti-anxiety, antidepressant).  Problem Solving Problem Solving: 6-Solves complex problems: With extra time  Memory Memory Mode: Asleep Memory: 6-More than reasonable amt of time   Medical Problem List and Plan: 1. 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 DVT Prophylaxis/Anticoagulation: Pharmaceutical: Lovenox--  3. Pain Management: Will continue hydrocodone prn.  4. Mood: Reports anxiety/restlessness due to steroids.   low dose xanax prn. LCSW to follow for evaluation and support.  5.  Neuropsych: This patient is capable of making decisions on his own behalf. 6. Skin/Wound Care: Routine pressure relief measures.  7. Fluids/Electrolytes/Nutrition: Monitor intake. Offer supplements as needed to maintain adequate intake.  8. DM type 2: Monitor BS ac/hs  ON amaryl PTA---currently on metformin bid additionally.   resumed amaryl at 2mg  daily today--  -levemir dc'ed.  9. Seizure prophylaxis: Continue Keppra bid.  10. HTN: Continue vasotec daily. Monitor BP every 8 hours and adjust as needed.   12 Constipation: moved bowels yesterday 13. Oncology:small cell prostate cancer  -dr. Benay Spice following, outpt ctx  -rad/onc---XRT today---14 treatments in total     LOS (Days) 15 A FACE TO FACE EVALUATION WAS PERFORMED  Dondi Burandt T 04/26/2014 10:49 AM

## 2014-04-26 NOTE — Progress Notes (Signed)
Occupational Therapy Session Note  Patient Details  Name: HEBER HOOG MRN: [id number] Date of Birth: 08-07-1945  Today's Date: 04/26/2014 OT Concurrent Time: 6948-5462 OT Concurrent Time Calculation (min): 60 min  Short Term Goals: Week 1:  OT Short Term Goal 1 (Week 1): Bath: performed in sit and stand with steady assist and use of LH sponge PRN. OT Short Term Goal 1 - Progress (Week 1): Met OT Short Term Goal 2 (Week 1): LB dressing: min assist to include sit and stand and AE PRN OT Short Term Goal 2 - Progress (Week 1): Met OT Short Term Goal 3 (Week 1): Toilet transfer: Min assist  OT Short Term Goal 3 - Progress (Week 1): Met OT Short Term Goal 4 (Week 1): Toileting: Min assist OT Short Term Goal 4 - Progress (Week 1): Met OT Short Term Goal 5 (Week 1): Simple kitchen tasks: Supervision at a w/c level OT Short Term Goal 5 - Progress (Week 1): Progressing toward goal   Week 2:  OT Short Term Goal 1 (Week 2): Pt will perform LB dressing sit<>stand with supervision OT Short Term Goal 2 (Week 2): Pt will perform toilet transfers with supervision OT Short Term Goal 3 (Week 2): Pt will perform toileting tasks (3/3) with supervision OT Short Term Goal 4 (Week 2): Pt will perform simple kitchen tasks at w/c level with supervision  Skilled Therapeutic Interventions/Progress Updates:  Patient received supine in bed finished with breakfast. Patient with no complaints of pain. Patient engaged in bed mobility at supervision level and transferred EOB>w/c with set-up assistance. Patient then propelled w/c into bathroom for shower stall transfer and ADL retraining at shower level. Patient required min verbal cues/supervision for w/c set-up prior to transfer. UB/LB bathing completed with supervision in seated position. Patient transferred out of shower and completed UB/LB dressing in sit<>stand position at mod I>supervision level. Grooming tasks completed seated at sink at mod I level. Patient  engaged in functional mobility activity of transporting dirty wash cloths > laundry bag. Therapist educated patient on safest and most effective way to engage in home management tasks using RW, keeping BUEs on RW at all times during movement. Patient with need to use restroom, patient performed w/c<>elevated toilet seat transfers with supervision using grab bars prn. Again, patient required min verbal cues for w/c set-up prior to transfer. At end of session, left patient seated in w/c with all needs within reach.   Precautions:  Precautions Precautions: Fall Precaution Comments: LLE hemiplegia Restrictions Weight Bearing Restrictions: No  Vital Signs: Therapy Vitals Temp: 98.1 F (36.7 C) Temp Source: Oral Pulse Rate: 80 Resp: 18 BP: 120/69 mmHg Patient Position (if appropriate): Lying Oxygen Therapy SpO2: 99 % O2 Device: Not Delivered  See FIM for current functional status  Therapy/Group: Individual Therapy  Britteny Fiebelkorn 04/26/2014, 9:13 AM

## 2014-04-26 NOTE — Telephone Encounter (Signed)
Spoke with [name] from [facility].  States they unfortunately do not have a transportation team for todays treatment.  Ivan Stark is calling [facility] to let nursing staff know they will not be picking him up.  He does have transportation for tomorrows appointment.

## 2014-04-27 ENCOUNTER — Encounter (HOSPITAL_COMMUNITY): Payer: Medicare Other

## 2014-04-27 ENCOUNTER — Ambulatory Visit
Admit: 2014-04-27 | Discharge: 2014-04-27 | Disposition: A | Discharge status: Home / Self Care | Payer: Medicare Other | Attending: Radiation Oncology | Admitting: Radiation Oncology

## 2014-04-27 ENCOUNTER — Inpatient Hospital Stay (HOSPITAL_COMMUNITY): Payer: Medicare Other

## 2014-04-27 DIAGNOSIS — C7931 Secondary malignant neoplasm of brain: Secondary | ICD-10-CM

## 2014-04-27 DIAGNOSIS — C61 Malignant neoplasm of prostate: Secondary | ICD-10-CM

## 2014-04-27 LAB — GLUCOSE, CAPILLARY
GLUCOSE-CAPILLARY: 102 mg/dL — AB (ref 70–99)
Glucose-Capillary: 84 mg/dL (ref 70–99)
Glucose-Capillary: 88 mg/dL (ref 70–99)
Glucose-Capillary: 165 mg/dL — ABNORMAL HIGH (ref 70–99)

## 2014-04-27 MED ORDER — LEVETIRACETAM 500 MG PO TABS: 500.0000 mg | ORAL_TABLET | Freq: Two times a day (BID) | ORAL | Status: DC

## 2014-04-27 MED ORDER — PANTOPRAZOLE SODIUM 40 MG PO TBEC: 40.0000 mg | DELAYED_RELEASE_TABLET | Freq: Every day | ORAL | Status: DC

## 2014-04-27 MED ORDER — SENNA 8.6 MG PO TABS: 2.0000 | ORAL_TABLET | Freq: Two times a day (BID) | ORAL | Status: AC

## 2014-04-27 MED ORDER — PRO-STAT SUGAR FREE PO LIQD: 30.0000 mL | Freq: Three times a day (TID) | ORAL | Status: DC

## 2014-04-27 MED ORDER — GLIMEPIRIDE 4 MG PO TABS: 2.0000 mg | ORAL_TABLET | Freq: Two times a day (BID) | ORAL | Status: DC

## 2014-04-27 MED ORDER — POLYETHYLENE GLYCOL 3350 17 G PO PACK: 60.0000 g | PACK | Freq: Two times a day (BID) | ORAL | Status: DC

## 2014-04-27 MED ORDER — SIMETHICONE 80 MG PO CHEW: 80.0000 mg | CHEWABLE_TABLET | Freq: Four times a day (QID) | ORAL | Status: DC | PRN

## 2014-04-27 MED ORDER — METFORMIN HCL ER 500 MG PO TB24: 500.0000 mg | ORAL_TABLET | Freq: Two times a day (BID) | ORAL | Status: DC

## 2014-04-27 MED ORDER — METFORMIN HCL ER (OSM) 1000 MG PO TB24: 1000.0000 mg | ORAL_TABLET | Freq: Every day | ORAL | Status: DC

## 2014-04-27 MED ORDER — HYDROCODONE-ACETAMINOPHEN 5-325 MG PO TABS: 1.0000 | ORAL_TABLET | Freq: Four times a day (QID) | ORAL | Status: DC | PRN

## 2014-04-27 MED ORDER — CALCIUM CARBONATE-VITAMIN D 500-200 MG-UNIT PO TABS: 1.0000 | ORAL_TABLET | Freq: Two times a day (BID) | ORAL | Status: DC

## 2014-04-27 MED ORDER — ENALAPRIL MALEATE 20 MG PO TABS: 20.0000 mg | ORAL_TABLET | Freq: Every day | ORAL | Status: DC

## 2014-04-27 MED ORDER — ALPRAZOLAM 0.5 MG PO TABS: 0.5000 mg | ORAL_TABLET | Freq: Two times a day (BID) | ORAL | Status: DC | PRN

## 2014-04-27 MED ORDER — DEXAMETHASONE 4 MG PO TABS: 4.0000 mg | ORAL_TABLET | Freq: Two times a day (BID) | ORAL | Status: DC

## 2014-04-27 NOTE — Progress Notes (Signed)
  Radiation Oncology         [phone] ________________________________  Name: Ivan Stark MRN: [id number]  Date: 04/27/2014  DOB: 05-10-1946  Weekly Radiation Therapy Management    ICD-9-CM ICD-10-CM   1. Metastatic cancer to brain 198.3 C79.31   2. Small cell carcinoma of prostate 185 C61     Current Dose: 7.5 Gy     Planned Dose:  35 Gy  Narrative . . . . . . . . The patient presents for routine under treatment assessment.                                   The patient is without complaint.                                 Set-up films were reviewed.                                 The chart was checked. Physical Findings. . . . Weight essentially stable.  No significant changes. Impression . . . . . . . The patient is tolerating radiation. Plan . . . . . . . . . . . . Continue treatment as planned.  ________________________________  Sheral Apley. Tammi Klippel, M.D.

## 2014-04-27 NOTE — Progress Notes (Signed)
[facility] Dept Therapy Treatment Record Phone [phone]   Radiation Therapy was administered to Ivan Stark on: 04/27/2014  12:39 PM and was treatment # 3 out of a planned course of 14 treatments.

## 2014-04-27 NOTE — Discharge Instructions (Signed)
Inpatient Rehab Discharge Instructions  Ivan Stark Discharge date and time:  04/28/14  Activities/Precautions/ Functional Status: Activity: no lifting, driving, or strenuous exercise for till cleared by MD.  Diet: diabetic diet. Drink plenty of fluids Wound Care: keep wound clean and dry   Functional status:  ___ No restrictions     ___ Walk up steps independently _X__ 24/7 supervision/assistance   ___ Walk up steps with assistance ___ Intermittent supervision/assistance  ___ Bathe/dress independently ___ Walk with walker     _X__ Bathe/dress with assistance ___ Walk Independently    ___ Shower independently _X__ Walk with assistance    ___ Shower with assistance _X__ No alcohol     ___ Return to work/school ________     COMMUNITY REFERRALS UPON DISCHARGE:    Home Health:   PT     OT                       Agency:  [facility]     Phone: [phone]    Medical Equipment/Items Ordered:  Wheelchair, cushion, walker and commode                                                      Agency/Supplier:  [facility] @ [phone]   GENERAL COMMUNITY RESOURCES FOR PATIENT/FAMILY:  Support Groups: available via [facility] (handout)    Caregiver Support: same    Special Instructions: 1. Check blood sugars at least twice a day. Eat a small snack at bedtime.    My questions have been answered and I understand these instructions. I will adhere to these goals and the provided educational materials after my discharge from the hospital.  Patient/Caregiver Signature _______________________________ Date __________  Clinician Signature _______________________________________ Date __________  Please bring this form and your medication list with you to all your follow-up doctor's appointments.

## 2014-04-27 NOTE — Progress Notes (Signed)
Physical Therapy Discharge Summary  Patient Details  Name: Ivan Stark MRN: [id number] Date of Birth: [birth year]  Today's Date: 04/27/2014 PT Individual Time: 1000-1100 PT Individual Time Calculation (min): 60 min    Patient has met 11 of 11 long term goals due to improved activity tolerance, improved balance, improved postural control, increased strength, ability to compensate for deficits, functional use of  left lower extremity, and improved coordination.  Patient to discharge at [facility]) wheelchair level and supervision-occasional min A at short distance ambulatory level with RW, min A for stairs. Pt's wife participated in 1 formal family education session, however, was not able to practice ambulation with pt but observed therapist assisting pt. Pt able to direct care as needed during ambulation for occasional assistance with L LE. Pt verbalized understanding that he is unsafe to negotiate up/down stairs with wife at this time (training not performed) and use of ramp for home entry is encouraged. Pt's wife is able to provide the recommended supervision-min A req during standing mobility at discharge.    Reasons goals not met: N/A  Recommendation:  Patient will benefit from ongoing skilled PT services in home health setting to continue to advance safe functional mobility, address ongoing impairments in decreased balance, decreased proprioception, decreased coordination, decreased strength, decreased postural control in standing, decreased overall functional transfers and mobility, and minimize fall risk.  Equipment: Wheelchair, [employer], AFO + heel wedge and toe cap modification  Reasons for discharge: treatment goals met and discharge from hospital  Patient/family agrees with progress made and goals achieved: Yes  Skilled Therapeutic Interventions 1:1. Pt received using bathroom, req supervision for pericare, clothing management and ambulation toilet>sink>w/c with RW. Focus this  session on functional endurance, safety during functional transfers and mobility and d/c planning. Pt able to demonstrate mod(I) w/c propulsion >200' on unit with B UE, supervision during all transfers (bed<>w/c<>furniture with use of RW), supervision for ambulation 175'x1 with RW and L AFO and min A (L LE foot clearance) for negotiation up/down 5 steps with B UE on single left rail using step-to pattern.   Reviewed home safety recommendations (pt mod(I) for w/c propulsion, req (S) for transfers and min A during short distance ambulation with RW), fall prevention, energy conservation (w/c vs. RW) as well as goals/benefits of HH PT. Pt verbalized understanding.   Pt left sitting in recliner at end of session w/ all needs in reach.    PT Discharge Precautions/Restrictions Precautions Precautions: Fall Precaution Comments: LLE impaired Restrictions Weight Bearing Restrictions: No Vital Signs   Pain Pain Assessment Pain Assessment: No/denies pain Vision/Perception   See OT discharge Cognition Overall Cognitive Status: Within Functional Limits for tasks assessed Arousal/Alertness: Awake/alert Orientation Level: Oriented X4 Attention: Selective Selective Attention: Appears intact Memory: Appears intact Awareness: Appears intact Problem Solving: Appears intact Safety/Judgment: Appears intact Sensation Sensation Light Touch: Appears Intact Stereognosis: Appears Intact Hot/Cold: Appears Intact Proprioception: Impaired by gross assessment Coordination Gross Motor Movements are Fluid and Coordinated: No Fine Motor Movements are Fluid and Coordinated: Yes Coordination and Movement Description: Impaired due to L LE, however, improved overall since admission Heel Shin Test: impaired by L LE strength and coordination Motor  Motor Motor: Hemiplegia Motor - Skilled Clinical Observations: LLE hemiplegia, thoracic kyphosis limiting upright posture  Motor - Discharge Observations: L LE  hemiplegia, improved since initial eval  Mobility Bed Mobility Bed Mobility: Supine to Sit;Sit to Supine Supine to Sit: 5: Supervision Supine to Sit Details: Verbal cues for technique;Verbal cues for precautions/safety Supine  to Sit Details (indicate cue type and reason): Pt self-assisting L LE in/out of bed Sit to Supine: 5: Supervision Sit to Supine - Details: Verbal cues for precautions/safety;Verbal cues for technique Sit to Supine - Details (indicate cue type and reason): Pt self-assisting L LE in/out of bed Transfers Transfers: Yes Sit to Stand: 5: Supervision Sit to Stand Details: Verbal cues for safe use of DME/AE Stand to Sit: 5: Supervision Stand to Sit Details (indicate cue type and reason): Verbal cues for safe use of DME/AE Stand Pivot Transfers: 5: Supervision Stand Pivot Transfer Details: Verbal cues for safe use of DME/AE Squat Pivot Transfers: 5: Supervision Squat Pivot Transfer Details: Verbal cues for precautions/safety Locomotion  Ambulation Ambulation: Yes Ambulation/Gait Assistance: 5: Supervision Ambulation Distance (Feet): 175 Feet Assistive device: Rolling walker;Other (Comment) (AFO) Ambulation/Gait Assistance Details: Verbal cues for gait pattern;Verbal cues for safe use of DME/AE;Manual facilitation for placement Ambulation/Gait Assistance Details: Pt req occasional min A for advancement of L LE when fatigued Gait Gait: Yes Gait Pattern: Impaired Gait Pattern: Step-through pattern;Decreased step length - left;Decreased step length - right;Decreased stance time - left;Decreased hip/knee flexion - left;Trunk flexed;Decreased dorsiflexion - left;Decreased weight shift to left Stairs / Additional Locomotion Stairs: Yes Stairs Assistance: 4: Min assist Stairs Assistance Details: Verbal cues for gait pattern;Verbal cues for technique;Verbal cues for sequencing;Verbal cues for precautions/safety Stair Management Technique: One rail Left;Step to  pattern;Sideways Number of Stairs: 5 Ramp: 6: Modified independent (Device) (Via w/c) Architect: Yes Wheelchair Assistance: 6: Modified independent (Device/Increase time) Environmental health practitioner: Both upper extremities Wheelchair Parts Management: Independent Distance: 300  Trunk/Postural Assessment  Cervical Assessment Cervical Assessment: Exceptions to [facility] (forward head) Thoracic Assessment Thoracic Assessment: Exceptions to [facility] (kyphotic) Lumbar Assessment Lumbar Assessment: Exceptions to [facility] (posterior pelvic tilt) Postural Control Postural Control: Deficits on evaluation Righting Reactions: delayed in standing, but improved  Balance Balance Balance Assessed: Yes Static Sitting Balance Static Sitting - Balance Support: Feet supported;No upper extremity supported Static Sitting - Level of Assistance: 6: Modified independent (Device/Increase time) Dynamic Sitting Balance Dynamic Sitting - Balance Support: Right upper extremity supported;Left upper extremity supported;Feet supported Dynamic Sitting - Level of Assistance: 6: Modified independent (Device/Increase time) Dynamic Sitting - Balance Activities: Forward lean/weight shifting;Reaching for objects;Lateral lean/weight shifting;Reaching across midline Static Standing Balance Static Standing - Balance Support: Bilateral upper extremity supported;Left upper extremity supported;Right upper extremity supported Static Standing - Level of Assistance: 5: Stand by assistance Dynamic Standing Balance Dynamic Standing - Balance Support: Left upper extremity supported;Right upper extremity supported;Bilateral upper extremity supported Dynamic Standing - Level of Assistance: 5: Stand by assistance Dynamic Standing - Balance Activities: Forward lean/weight shifting;Lateral lean/weight shifting;Reaching for objects Extremity Assessment  RUE Assessment RUE Assessment: Within Functional Limits LUE  Assessment LUE Assessment: Within Functional Limits RLE Assessment RLE Assessment: Exceptions to [facility] RLE Strength RLE Overall Strength: Deficits RLE Overall Strength Comments: Hip flexion: 4-/5; Knee flex/ext and PF/DF: 4/5 LLE Assessment LLE Assessment: Exceptions to [facility] LLE Strength LLE Overall Strength: Deficits LLE Overall Strength Comments: Hip flex, knee flex/ext: 2+/5; DF/PF: 1/5  See FIM for current functional status  Gilmore Laroche 04/27/2014, 12:40 PM

## 2014-04-27 NOTE — Progress Notes (Signed)
Occupational Therapy Discharge Summary  Patient Details  Name: Ivan Stark MRN: [id number] Date of Birth: Oct 14, 1945  Patient has met 67 of 11 long term goals due to improved activity tolerance, improved balance, postural control, ability to compensate for deficits, improved attention, improved awareness and improved coordination.  Pt made excellent and steady progress with BADLs during this admission.  Pt is supervision for BADLs and IADLs including simple meal prep and light housekeeping tasks.  Pt's wife has been present for therapy and participated in providing appropriate supervision for patient.  Pt and wife verbalized understanding that recommendation is for 24 hour supervision.   Recommendation:  Patient will benefit from ongoing skilled OT services in home health setting to continue to advance functional skills in the area of BADL, iADL and Reduce care partner burden.  Equipment: Drop Arm BSC only; patient has a shower seat for home use.   Reasons for discharge: treatment goals met and discharge from hospital  Patient/family agrees with progress made and goals achieved: Yes  Precautions/Restrictions  Precautions Precautions: Fall Precaution Comments: LLE impaired Restrictions Weight Bearing Restrictions: No Pain Assessment Pain Assessment: No/denies pain   Vision/Perception  Vision- History Baseline Vision/History: No visual deficits;Wears glasses Wears Glasses: At all times Patient Visual Report: No change from baseline   Cognition Overall Cognitive Status: Within Functional Limits for tasks assessed Arousal/Alertness: Awake/alert Orientation Level: Oriented X4 Attention: Selective Selective Attention: Appears intact Memory: Appears intact Awareness: Appears intact Problem Solving: Appears intact Safety/Judgment: Appears intact   Sensation Sensation Light Touch: Appears Intact Stereognosis: Appears Intact Hot/Cold: Appears Intact Proprioception: Impaired  by gross assessment Coordination Gross Motor Movements are Fluid and Coordinated: No Fine Motor Movements are Fluid and Coordinated: Yes Coordination and Movement Description: Impaired due to L LE, however, improved overall since admission Heel Shin Test: impaired by L LE strength and coordination   Motor  Motor Motor: Hemiplegia Motor - Skilled Clinical Observations: LLE hemiplegia, thoracic kyphosis limiting upright posture  Motor - Discharge Observations: L LE hemiplegia, improved since initial eval   Trunk/Postural Assessment  Cervical Assessment Cervical Assessment: Exceptions to [facility] (forward head) Thoracic Assessment Thoracic Assessment: Exceptions to [facility] (slight forward head) Lumbar Assessment Lumbar Assessment: Within Functional Limits Postural Control Righting Reactions: impaired/delayed   Balance Static Sitting Balance Static Sitting - Balance Support: Feet supported Static Sitting - Level of Assistance: 6: Modified independent (Device/Increase time) Dynamic Standing Balance Dynamic Standing - Balance Support: No upper extremity supported;During functional activity Dynamic Standing - Level of Assistance: 5: Stand by assistance   Extremity/Trunk Assessment RUE Assessment RUE Assessment: Within Functional Limits LUE Assessment LUE Assessment: Within Functional Limits  See FIM for current functional status  Leroy Libman 04/27/2014, 10:57 AM

## 2014-04-27 NOTE — Progress Notes (Signed)
[facility] PHYSICAL MEDICINE & REHABILITATION     PROGRESS NOTE    Subjective/Complaints: Had a great night sleep. Moving toes this morning. Happy to be going home.   ROS:  Left foot and ankle weakness present Objective: Vital Signs: Blood pressure 107/68, pulse 67, temperature 97.7 F (36.5 C), temperature source Oral, resp. rate 20, height 6\' 4"  (1.93 m), weight 89.4 kg (197 lb 1.5 oz), SpO2 99 %. No results found. No results for input(s): WBC, HGB, HCT, PLT in the last 72 hours. No results for input(s): NA, K, CL, GLUCOSE, BUN, CREATININE, CALCIUM in the last 72 hours.  Invalid input(s): CO CBG (last 3)   Recent Labs  04/26/14 1628 04/26/14 2048 04/27/14 0708  GLUCAP 140* 189* 84    Wt Readings from Last 3 Encounters:  04/25/14 89.4 kg (197 lb 1.5 oz)  04/06/14 94.8 kg (208 lb 15.9 oz)  03/29/14 95.981 kg (211 lb 9.6 oz)    Physical Exam:  Constitutional: He is oriented to person, place, and time. He appears well-developed.  HENT: oral mucosa pink and moist Craniotomy site with staples intact. Clean. Old blood around incision Eyes: EOM are normal.  Neck: Normal range of motion. Neck supple. No thyromegaly present.  Cardiovascular: Normal rate and regular rhythm.  Respiratory: Effort normal and breath sounds normal. No respiratory distress.  GI: Soft. Bowel sounds are normal. He exhibits no distension.  Neurological: He is alert and oriented to person, place, and time.  Follows commands. Good insight and awareness. Memory intact. Language normal. no CN deficits.  Right upper extremity 5/5 deltoid, biceps, triceps, grip, left upper extremity 4+/5 in the deltoid, bicep, tricep, grip. 4/5 right hip flexor knee extensor and ankle dorsiflexor plantar flexor. Left lower extremity  3-/5 hip flexor knee extensor, 2/5 HF and 1/5 toe ex/flex Sensation sl diminished to LT left  leg. Mood and affect are appropriate No left neglect. No abnormal tone.  Skin: Skin is  warm and dry.  Psych: mood remains pleasant and up beat  Assessment/Plan: 1. Functional deficits secondary to right frontal meningioma, metastatic lung cancer which require 3+ hours per day of interdisciplinary therapy in a comprehensive inpatient rehab setting. Physiatrist is providing close team supervision and 24 hour management of active medical problems listed below. Physiatrist and rehab team continue to assess barriers to discharge/monitor patient progress toward functional and medical goals.   Home tomorrow. folllow up with me, rad/onc, onc, ns, pcp  FIM: FIM - Bathing Bathing Steps Patient Completed: Chest, Right Arm, Left upper leg, Left Arm, Right lower leg (including foot), Abdomen, Front perineal area, Buttocks, Right upper leg, Left lower leg (including foot) Bathing: 5: Supervision: Safety issues/verbal cues  FIM - Upper Body Dressing/Undressing Upper body dressing/undressing steps patient completed: Thread/unthread left sleeve of pullover shirt/dress, Pull shirt over trunk, Put head through opening of pull over shirt/dress, Thread/unthread right sleeve of pullover shirt/dresss Upper body dressing/undressing: 6: More than reasonable amount of time FIM - Lower Body Dressing/Undressing Lower body dressing/undressing steps patient completed: Thread/unthread right underwear leg, Thread/unthread left pants leg, Thread/unthread left underwear leg, Pull pants up/down, Don/Doff right shoe, Pull underwear up/down, Fasten/unfasten pants, Thread/unthread right pants leg, Don/Doff right sock, Fasten/unfasten right shoe, Don/Doff left sock, Fasten/unfasten left shoe, Don/Doff left shoe Lower body dressing/undressing: 5: Supervision: Safety issues/verbal cues (sit<>stand position)  FIM - Toileting Toileting steps completed by patient: Adjust clothing prior to toileting, Performs perineal hygiene, Adjust clothing after toileting Toileting Assistive Devices: Grab bar or rail for  support Toileting: 4: Steadying assist  FIM - Radio producer Devices: Elevated toilet seat, Grab bars Toilet Transfers: 4-To toilet/BSC: Min A (steadying Pt. > 75%), 5-From toilet/BSC: Supervision (verbal cues/safety issues)  FIM - Control and instrumentation engineer Devices: Arm rests Bed/Chair Transfer: 5: Sit > Supine: Supervision (verbal cues/safety issues), 5: Bed > Chair or W/C: Supervision (verbal cues/safety issues), 5: Chair or W/C > Bed: Supervision (verbal cues/safety issues)  FIM - Locomotion: Wheelchair Distance: 150 Locomotion: Wheelchair: 5: Travels 150 ft or more: maneuvers on rugs and over door sills with supervision, cueing or coaxing FIM - Locomotion: Ambulation Locomotion: Ambulation Assistive Devices: Orthosis, Administrator Ambulation/Gait Assistance: 4: Min guard Locomotion: Ambulation: 1: Travels less than 50 ft with minimal assistance (Pt.>75%)  Comprehension Comprehension Mode: Auditory Comprehension: 6-Follows complex conversation/direction: With extra time/assistive device  Expression Expression Mode: Verbal Expression: 6-Expresses complex ideas: With extra time/assistive device  Social Interaction Social Interaction: 6-Interacts appropriately with others with medication or extra time (anti-anxiety, antidepressant).  Problem Solving Problem Solving: 6-Solves complex problems: With extra time  Memory Memory Mode: Asleep Memory: 6-More than reasonable amt of time   Medical Problem List and Plan: 1. 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 DVT Prophylaxis/Anticoagulation: Pharmaceutical: Lovenox--  3. Pain Management: Will continue hydrocodone prn.  4. Mood: Reports anxiety/restlessness due to steroids.   low dose xanax prn. LCSW to follow for evaluation and support.  5. Neuropsych: This patient  is capable of making decisions on his own behalf. 6. Skin/Wound Care: Routine pressure relief measures.  7. Fluids/Electrolytes/Nutrition: Monitor intake. Offer supplements as needed to maintain adequate intake.  8. DM type 2: Monitor BS ac/hs  ON amaryl PTA---currently on metformin bid additionally.   resumed amaryl at 2mg  daily today--  -levemir dc'ed.  9. Seizure prophylaxis: Continue Keppra bid.  10. HTN: Continue vasotec daily. Monitor BP every 8 hours and adjust as needed.   12 Constipation: moved bowels yesterday 13. Oncology:small cell prostate cancer  -dr. Benay Spice following, outpt ctx  -rad/onc---(treatment cancelled yesterday) XRT today---14 treatments in total     LOS (Days) 16 A FACE TO FACE EVALUATION WAS PERFORMED  Ivan Stark 04/27/2014 7:41 AM

## 2014-04-27 NOTE — Progress Notes (Addendum)
Occupational Therapy Note  Patient Details  Name: Ivan Stark MRN: [id number] Date of Birth: [birth year]  Today's Date: 04/27/2014 OT Individual Time: 0900-1000 OT Individual Time Calculation (min): 60 min   Pt denied pain Individual Therapy  Pt practiced donning/doffing AFO and shoes, toilet transfers, tub bench transfers, and engaged in functional amb with RW for simple home mgmt tasks.  Pt performed simple meal prep and light housekeeping tasks at w/c level and standing at kitchen counter. Continued discharge planning.  Pt completed all tasks at supervision level.  Pt did not have any additional questions and is pleased with progress during this admission.   Leotis Shames [facility], [place] 04/27/2014, 10:04 AM

## 2014-04-27 NOTE — Progress Notes (Signed)
Patient for weekly under treat assessment of radiation to prostate and brain.completed course of 3 out of 14 treatments.Given Radiation Therapy and You Booklet and informed to read checked off areas.Informed to use valet service on Monday after discharged and come down to ground floor to check in for treatment if discharged this weekend.

## 2014-04-27 NOTE — Discharge Summary (Addendum)
Physician Discharge Summary  Patient ID: Ivan Stark MRN: [id number] DOB/AGE: 1945/08/21 68 y.o.  Admit date: 04/11/2014 Discharge date: 05/07/2014  Discharge Diagnoses:  Principal Problem:   Metastatic cancer to brain Active Problems:   Cerebral meningioma   Left flaccid hemiparesis   Small cell carcinoma of prostate   Vision disturbance   Discharged Condition: Stable   Significant Diagnostic Studies: Ct Chest W Contrast  04/13/2014   CLINICAL DATA:  Small cell carcinoma, 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  EXAM: CT CHEST, ABDOMEN, AND PELVIS WITH CONTRAST  TECHNIQUE: Multidetector CT imaging of the chest, abdomen and pelvis was performed following the standard protocol during bolus administration of intravenous contrast.  CONTRAST:  158mL OMNIPAQUE IOHEXOL 300 MG/ML  SOLN  COMPARISON:  None.  FINDINGS: CT CHEST FINDINGS  Left lobe of the thyroid is asymmetrically enlarged and contains low-attenuation nodules measuring up to 1.6 cm. No pathologically enlarged mediastinal, hilar or axillary lymph nodes. Atherosclerotic calcification of the arterial vasculature, including three-vessel involvement of the coronary arteries. Heart size normal. No pericardial effusion. Small hiatal hernia also contains fluid.  Minimal biapical pleural parenchymal scarring. Probable subpleural atelectasis or scarring along the superior aspect of the right major fissure (image 22). Lungs are otherwise clear. No pleural fluid. Airway is unremarkable.  CT ABDOMEN AND PELVIS FINDINGS  Hepatobiliary: Liver and gallbladder are unremarkable. No biliary ductal dilatation.  Pancreas: Negative.  Spleen: Negative.  Adrenals/Urinary Tract: Adrenal glands and kidneys are unremarkable. Ureters are decompressed. Prostate indents the bladder base.  Stomach/Bowel: Small hiatal hernia. Stomach, small bowel and appendix are unremarkable. A polypoid soft tissue lesion is seen in the cecum, measuring approximately 1.7 x 2.4 cm (axial  image 97 and coronal image 82). Enlarged prostate exerts mass effect on the rectum. Colon is otherwise unremarkable.  Vascular/Lymphatic: Atherosclerotic calcification of the arterial vasculature without abdominal aortic aneurysm. Circumaortic left renal vein. Ileocolic mesenteric lymph node measures 1.2 x 2.1 cm (axial image 96). Lymph nodes are seen adjacent to the prostate, along the superior margin at the 7 o'clock position, measuring up to 11 mm in short axis (axial image 127). No additional pathologically enlarged lymph nodes.  Reproductive: Prostate is markedly enlarged and low in attenuation centrally, measuring approximately 7.5 x 8.1 cm.  Other: A heterogeneous subcutaneous nodule is seen in the inferior left flank, measuring 1.2 x 1.9 cm (axial image 87). Small bilateral inguinal hernias contain fat, left greater than right. Locules of air in the subcutaneous ventral abdominal wall are most likely iatrogenic. Mesenteries and peritoneum are otherwise unremarkable.  Musculoskeletal: A vague area of sclerosis in the left iliac wing (axial image 113) is nonspecific. Otherwise, no worrisome lytic or sclerotic lesions.  IMPRESSION: 1. No evidence of primary bronchogenic carcinoma. 2. Polypoid lesion in the cecum, with a pathologically enlarged ileocolic mesenteric lymph node, worrisome for primary colon carcinoma. 3. Markedly enlarged and necrotic appearing prostate with adjacent adenopathy. Associated mass effect on the bladder and rectum. 4. Heterogeneous subcutaneous nodule in the inferior left flank, likely palpable on physical examination. Appearance is not consistent with a sebaceous cyst. 5. Asymmetrically enlarged left thyroid with low-attenuation lesions. Consider further evaluation with thyroid ultrasound. If patient is clinically hyperthyroid, consider nuclear medicine thyroid uptake and scan. 6. Small hiatal hernia contains a small amount of fluid. 7. Three-vessel coronary artery calcification. 8.  Small bilateral inguinal hernias contain fat.   Electronically Signed   By: Lorin Picket M.D.   On: 04/13/2014 08:30    Ct Abdomen Pelvis W  Contrast  04/13/2014   CLINICAL DATA:  Small cell carcinoma, 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  EXAM: CT CHEST, ABDOMEN, AND PELVIS WITH CONTRAST  TECHNIQUE: Multidetector CT imaging of the chest, abdomen and pelvis was performed following the standard protocol during bolus administration of intravenous contrast.  CONTRAST:  159mL OMNIPAQUE IOHEXOL 300 MG/ML  SOLN  COMPARISON:  None.  FINDINGS: CT CHEST FINDINGS  Left lobe of the thyroid is asymmetrically enlarged and contains low-attenuation nodules measuring up to 1.6 cm. No pathologically enlarged mediastinal, hilar or axillary lymph nodes. Atherosclerotic calcification of the arterial vasculature, including three-vessel involvement of the coronary arteries. Heart size normal. No pericardial effusion. Small hiatal hernia also contains fluid.  Minimal biapical pleural parenchymal scarring. Probable subpleural atelectasis or scarring along the superior aspect of the right major fissure (image 22). Lungs are otherwise clear. No pleural fluid. Airway is unremarkable.  CT ABDOMEN AND PELVIS FINDINGS  Hepatobiliary: Liver and gallbladder are unremarkable. No biliary ductal dilatation.  Pancreas: Negative.  Spleen: Negative.  Adrenals/Urinary Tract: Adrenal glands and kidneys are unremarkable. Ureters are decompressed. Prostate indents the bladder base.  Stomach/Bowel: Small hiatal hernia. Stomach, small bowel and appendix are unremarkable. A polypoid soft tissue lesion is seen in the cecum, measuring approximately 1.7 x 2.4 cm (axial image 97 and coronal image 82). Enlarged prostate exerts mass effect on the rectum. Colon is otherwise unremarkable.  Vascular/Lymphatic: Atherosclerotic calcification of the arterial vasculature without abdominal aortic aneurysm. Circumaortic left renal vein. Ileocolic mesenteric lymph node  measures 1.2 x 2.1 cm (axial image 96). Lymph nodes are seen adjacent to the prostate, along the superior margin at the 7 o'clock position, measuring up to 11 mm in short axis (axial image 127). No additional pathologically enlarged lymph nodes.  Reproductive: Prostate is markedly enlarged and low in attenuation centrally, measuring approximately 7.5 x 8.1 cm.  Other: A heterogeneous subcutaneous nodule is seen in the inferior left flank, measuring 1.2 x 1.9 cm (axial image 87). Small bilateral inguinal hernias contain fat, left greater than right. Locules of air in the subcutaneous ventral abdominal wall are most likely iatrogenic. Mesenteries and peritoneum are otherwise unremarkable.  Musculoskeletal: A vague area of sclerosis in the left iliac wing (axial image 113) is nonspecific. Otherwise, no worrisome lytic or sclerotic lesions.  IMPRESSION: 1. No evidence of primary bronchogenic carcinoma. 2. Polypoid lesion in the cecum, with a pathologically enlarged ileocolic mesenteric lymph node, worrisome for primary colon carcinoma. 3. Markedly enlarged and necrotic appearing prostate with adjacent adenopathy. Associated mass effect on the bladder and rectum. 4. Heterogeneous subcutaneous nodule in the inferior left flank, likely palpable on physical examination. Appearance is not consistent with a sebaceous cyst. 5. Asymmetrically enlarged left thyroid with low-attenuation lesions. Consider further evaluation with thyroid ultrasound. If patient is clinically hyperthyroid, consider nuclear medicine thyroid uptake and scan. 6. Small hiatal hernia contains a small amount of fluid. 7. Three-vessel coronary artery calcification. 8. Small bilateral inguinal hernias contain fat.   Electronically Signed   By: Lorin Picket M.D.   On: 04/13/2014 08:30    Labs:  Basic Metabolic Panel: BMP Latest Ref Rng 04/14/2014 04/12/2014 04/07/2014  Glucose 70 - 99 mg/dL 146(H) 136(H) 266(H)  BUN 6 - 23 mg/dL 25(H) 20 9   Creatinine 0.50 - 1.35 mg/dL 0.79 0.82 0.84  Sodium 137 - 147 mEq/L 139 140 140  Potassium 3.7 - 5.3 mEq/L 4.2 4.5 4.2  Chloride 96 - 112 mEq/L 101 102 106  CO2 19 - 32 mEq/L 26  24 22  Calcium 8.4 - 10.5 mg/dL 9.1 8.9 8.7     CBC: CBC Latest Ref Rng 04/12/2014 04/07/2014 04/06/2014  WBC 4.0 - 10.5 K/uL 10.3 23.5(H) 13.9(H)  Hemoglobin 13.0 - 17.0 g/dL 11.4(L) 10.9(L) 11.4(L)  Hematocrit 39.0 - 52.0 % 34.3(L) 33.0(L) 34.0(L)  Platelets 150 - 400 K/uL 306 200 188     Brief HPI:   Ivan Stark is a 68 y.o. H/o DM type 2, HTN, gait instability with LLE weakness since July 2015 and MRI of brain revealed three intracranial tumors consistent with meningioma and right sided lesion with edema likely responsible for symptoms. He was evaluated by Dr. Kathyrn Sheriff and admitted on 04/06/14 for stereotactic bilateral frontoparietal craniotomy with resection of left and right frontal tumor. Post op leg weakness treated with decadron. 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 He has had elevated BS due to steroids and was started on levemir as well as decadron taper. He has had some improvement in LE strength but with flexor tone LLE as well as poor safety and impulsivity. 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 CIR recommended for follow up therapy.    Hospital Course: Ivan Stark was admitted to rehab 04/11/2014 for inpatient therapies to consist of PT, ST and OT at least three hours five days a week. Past admission physiatrist, therapy team and rehab RN have worked together to provide customized collaborative inpatient rehab. He reported high levels of anxiety and was started on low dose xanax to help with symptoms. Blood sugars have been monitored on ac/hs basis and metformin and Amaryl were resumed and titrated for tighter control.  Po intake has been good. He was started on bowel program to help with constipation. CT abdomen/pelvis was ordered by Hem/Onc and revealed  Prostate mass as well as colonic mass.Marland Kitchen  He underwent prostate ultrasound with biopsy on 11/09  by Dr. Gaynelle Arabian and  rectal exam revealed hard deflecting mass within 10 cm of anal verge concerns for pending rectal obstruction. Biopsy from  6/6 areas were positive for small cell carcinoma. Dr. Tammi Klippel was consulted for input and recommended radiotherapy to prostate to prevent rectal and urethral  obstruction  as well as whole brain to control metastases. Patient underwent colonoscopy by Dr. Oletta Lamas on 04/19/14 and cecal mass was positive for small cell cancer.    Radiation therapy was initiated on 11/17 with plans for 14 total treatment. Patient reported improvement in hesitancy since radiation initiated.  Dr. Beverly Gust with neuropsychology has followed up for cognitive evaluation as well as support. Patient is dealing well with current diagnosis without reports or signs of depression. He was educated on monitoring on functional cognitive decline as well as signs of serious depression. He continues on keppra for seizure prophylaxis and has been seizure free. Left ankle weakness continues and he was fitted with AFO to help with gait quality and is to continue PRAFO at bedtime to prevent foot drop. Patient has made steady, excellent progress during his rehab stay and is modified independent at wheel chair level and requires supervision with mobility. He will continue to receive HHPT and HHOT by [facility] past discharge.    Rehab course: During patient's stay in rehab weekly team conferences were held to monitor patient's progress, set goals and discuss barriers to discharge. Patient has had improvement in activity tolerance, balance, postural control,  as well as ability to compensate for deficits.   He requires supervision for BADL and IADLs. He is showing  improvement in function use of LLE and requires supervision to occasional min assist for ambulating short distances with RW.  Family education was done with wife regarding all aspects of care as well as providing min assist to supervision for transfers and mobility.    Disposition: Home  Diet:  Diabetic diet.   Special Instructions: 1. Check blood sugars at least twice a day. Eat a small snack at bedtime.  2. Drink plenty of fluids.      Medication List    TAKE these medications        ALPRAZolam 0.5 MG tablet--Rx # 30 pills  Commonly known as:  XANAX  Take 1 tablet (0.5 mg total) by mouth 2 (two) times daily as needed for anxiety.     aspirin EC 81 MG tablet  Take 81 mg by mouth daily.     calcium-vitamin D 500-200 MG-UNIT per tablet  Commonly known as:  OSCAL WITH D  Take 1 tablet by mouth 2 (two) times daily.     dexamethasone 4 MG tablet  Commonly known as:  DECADRON  Take 1 tablet (4 mg total) by mouth 2 (two) times daily.     enalapril 20 MG tablet  Commonly known as:  VASOTEC  Take 1 tablet (20 mg total) by mouth daily.     feeding supplement (PRO-STAT SUGAR FREE 64) Liqd  Take 30 mLs by mouth 3 (three) times daily with meals.     glimepiride 4 MG tablet  Commonly known as:  AMARYL  Take 0.5 tablets (2 mg total) by mouth 2 (two) times daily.     HYDROcodone-acetaminophen 5-325 MG per tablet--Rx # 30 pills  Commonly known as:  NORCO/VICODIN  Take 1 tablet by mouth every 6 (six) hours as needed for moderate pain.     levETIRAcetam 500 MG tablet  Commonly known as:  KEPPRA  Take 1 tablet (500 mg total) by mouth 2 (two) times daily.     metFORMIN 500 MG 24 hr tablet  Commonly known as:  GLUCOPHAGE-XR  Take 1 tablet (500 mg total) by mouth 2 (two) times daily with breakfast and lunch.     metformin 1000 MG (OSM) 24 hr tablet  Commonly known as:  FORTAMET  Take 1 tablet (1,000 mg total) by mouth daily with supper.     multivitamin with minerals Tabs tablet   Take 1 tablet by mouth daily.     pantoprazole 40 MG tablet  Commonly known as:  PROTONIX  Take 1 tablet (40 mg total) by mouth at bedtime.     polyethylene glycol packet  Commonly known as:  MIRALAX / GLYCOLAX  Take 60 g by mouth 2 (two) times daily.     pravastatin 40 MG tablet  Commonly known as:  PRAVACHOL  Take 1 tablet (40 mg total) by mouth at bedtime.     senna 8.6 MG Tabs tablet  Commonly known as:  SENOKOT  Take 2 tablets (17.2 mg total) by mouth 2 (two) times daily. For constipation. Adjust as  needed     simethicone 80 MG chewable tablet  Commonly known as:  MYLICON  Chew 1 tablet (80 mg total) by mouth 4 (four) times daily as needed for flatulence.     vitamin C 1000 MG tablet  Take 1,000 mg by mouth daily.     Vitamin D 2000 UNITS Caps  Take 4,000 Units by mouth daily.       Follow-up Information    Follow up with Meredith Staggers, MD On 06/12/2014.   Specialty:  Physical Medicine and Rehabilitation   Why:  Be there at 12 noon for 12:20 appointment    Contact information:   [place]. [address] [place] [address] [phone]       Follow up with Jairo Ben, MD. Call today.   Specialty:  Neurosurgery   Why:  for follow up appointment   Contact information:   [address] [place] [address] [phone]       Follow up with Lora Paula, MD.   Specialty:  Radiation Oncology   Contact information:   [place] [address] [phone]       Follow up with Betsy Coder, MD.   Specialty:  Oncology   Contact information:   [place] [address] [phone]       Signed: Bary Leriche 05/07/2014, 3:34 PM

## 2014-04-27 NOTE — Plan of Care (Signed)
Problem: RH Balance Goal: LTG: Patient will maintain dynamic sitting balance (OT) LTG: Patient will maintain dynamic sitting balance with assistance during activities of daily living (OT)  Outcome: Completed/Met Date Met:  04/27/14 Goal: LTG Patient will maintain dynamic standing with ADLs (OT) LTG: Patient will maintain dynamic standing balance with assist during activities of daily living (OT)  Outcome: Completed/Met Date Met:  04/27/14  Problem: RH Grooming Goal: LTG Patient will perform grooming w/assist,cues/equip (OT) LTG: Patient will perform grooming with assist, with/without cues using equipment (OT)  Outcome: Completed/Met Date Met:  04/27/14  Problem: RH Bathing Goal: LTG Patient will bathe with assist, cues/equipment (OT) LTG: Patient will bathe specified number of body parts with assist with/without cues using equipment (position) (OT)  Outcome: Completed/Met Date Met:  04/27/14  Problem: RH Dressing Goal: LTG Patient will perform upper body dressing (OT) LTG Patient will perform upper body dressing with assist, with/without cues (OT).  Outcome: Completed/Met Date Met:  04/27/14 Goal: LTG Patient will perform lower body dressing w/assist (OT) LTG: Patient will perform lower body dressing with assist, with/without cues in positioning using equipment (OT)  Outcome: Completed/Met Date Met:  04/27/14  Problem: RH Toileting Goal: LTG Patient will perform toileting w/assist, cues/equip (OT) LTG: Patient will perform toiletiing (clothes management/hygiene) with assist, with/without cues using equipment (OT)  Outcome: Completed/Met Date Met:  04/27/14  Problem: RH Simple Meal Prep Goal: LTG Patient will perform simple meal prep w/assist (OT) LTG: Patient will perform simple meal prep with assistance, with/without cues (OT).  Outcome: Completed/Met Date Met:  04/27/14  Problem: RH Light Housekeeping Goal: LTG Patient will perform light housekeeping w/assist (OT) LTG: Patient  will perform light housekeeping with assistance, with/without cues (OT).  Outcome: Completed/Met Date Met:  04/27/14  Problem: RH Toilet Transfers Goal: LTG Patient will perform toilet transfers w/assist (OT) LTG: Patient will perform toilet transfers with assist, with/without cues using equipment (OT)  Outcome: Completed/Met Date Met:  04/27/14  Problem: RH Tub/Shower Transfers Goal: LTG Patient will perform tub/shower transfers w/assist (OT) LTG: Patient will perform tub/shower transfers with assist, with/without cues using equipment (OT)  Outcome: Completed/Met Date Met:  04/27/14

## 2014-04-27 NOTE — Plan of Care (Signed)
Problem: RH Balance Goal: LTG Patient will maintain dynamic sitting balance (PT) LTG: Patient will maintain dynamic sitting balance with assistance during mobility activities (PT)  Outcome: Completed/Met Date Met:  04/27/14 Goal: LTG Patient will maintain dynamic standing balance (PT) LTG: Patient will maintain dynamic standing balance with assistance during mobility activities (PT)  Outcome: Completed/Met Date Met:  04/27/14  Problem: RH Bed Mobility Goal: LTG Patient will perform bed mobility with assist (PT) LTG: Patient will perform bed mobility with assistance, with/without cues (PT).  Outcome: Completed/Met Date Met:  04/27/14  Problem: RH Car Transfers Goal: LTG Patient will perform car transfers with assist (PT) LTG: Patient will perform car transfers with assistance (PT).  Outcome: Completed/Met Date Met:  04/27/14  Problem: RH Furniture Transfers Goal: LTG Patient will perform furniture transfers w/assist (OT/PT LTG: Patient will perform furniture transfers with assistance (OT/PT).  Outcome: Completed/Met Date Met:  04/27/14  Problem: RH Ambulation Goal: LTG Patient will ambulate in controlled environment (PT) LTG: Patient will ambulate in a controlled environment, # of feet with assistance (PT).  Outcome: Completed/Met Date Met:  04/27/14  Problem: RH Wheelchair Mobility Goal: LTG Patient will propel w/c in controlled environment (PT) LTG: Patient will propel wheelchair in controlled environment, # of feet with assist (PT)  Outcome: Completed/Met Date Met:  04/27/14  Problem: RH Stairs Goal: LTG Patient will ambulate up and down stairs w/assist (PT) LTG: Patient will ambulate up and down # of stairs with assistance (PT)  Outcome: Completed/Met Date Met:  04/27/14  Problem: RH Ambulation Goal: LTG Patient will ambulate in home environment (PT) LTG: Patient will ambulate in home environment, # of feet with assistance (PT).  Outcome: Completed/Met Date Met:   04/27/14  Problem: RH Wheelchair Mobility Goal: LTG Patient will propel w/c in home environment (PT) LTG: Patient will propel wheelchair in home environment, # of feet with assistance (PT).  Outcome: Completed/Met Date Met:  04/27/14 Goal: LTG Patient will propel w/c in community environment (PT) LTG: Patient will propel wheelchair in community environment, # of feet with assist (PT)  Outcome: Completed/Met Date Met:  04/27/14

## 2014-04-27 NOTE — Plan of Care (Signed)
Problem: RH BOWEL ELIMINATION Goal: RH STG MANAGE BOWEL WITH ASSISTANCE STG Manage Bowel with min Assistance.  Outcome: Completed/Met Date Met:  04/27/14 Goal: RH STG MANAGE BOWEL W/MEDICATION W/ASSISTANCE STG Manage Bowel with Medication with min. Assistance.  Outcome: Completed/Met Date Met:  04/27/14  Problem: RH SKIN INTEGRITY Goal: RH STG SKIN FREE OF INFECTION/BREAKDOWN Skin to remain free from infection and breakdown while on rehab With min. assist  Outcome: Completed/Met Date Met:  04/27/14 Goal: RH STG MAINTAIN SKIN INTEGRITY WITH ASSISTANCE STG Maintain Skin Integrity With min.Assistance.  Outcome: Completed/Met Date Met:  04/27/14  Problem: RH SAFETY Goal: RH STG ADHERE TO SAFETY PRECAUTIONS W/ASSISTANCE/DEVICE STG Adhere to Safety Precautions With mod. Assistance/Device.  Outcome: Completed/Met Date Met:  04/27/14

## 2014-04-27 NOTE — Plan of Care (Signed)
Problem: RH SAFETY Goal: RH STG ADHERE TO SAFETY PRECAUTIONS W/ASSISTANCE/DEVICE STG Adhere to Safety Precautions With mod. Assistance/Device.  Outcome: Progressing

## 2014-04-27 NOTE — Progress Notes (Signed)
Occupational Therapy Session Note  Patient Details  Name: ARSHIA RONDON MRN: [id number] Date of Birth: 06/24/1945  Today's Date: 04/27/2014 OT Individual Time: 0700-0800 OT Individual Time Calculation (min): 60 min    Short Term Goals: Week 1:  OT Short Term Goal 1 (Week 1): Bath: performed in sit and stand with steady assist and use of LH sponge PRN. OT Short Term Goal 1 - Progress (Week 1): Met OT Short Term Goal 2 (Week 1): LB dressing: min assist to include sit and stand and AE PRN OT Short Term Goal 2 - Progress (Week 1): Met OT Short Term Goal 3 (Week 1): Toilet transfer: Min assist  OT Short Term Goal 3 - Progress (Week 1): Met OT Short Term Goal 4 (Week 1): Toileting: Min assist OT Short Term Goal 4 - Progress (Week 1): Met OT Short Term Goal 5 (Week 1): Simple kitchen tasks: Supervision at a w/c level OT Short Term Goal 5 - Progress (Week 1): Progressing toward goal  Skilled Therapeutic Interventions/Progress Updates:    Pt engaged in BADL retraining including toilet transfers, toileting, shower transfers, bathing at shower level, and dressing with sit<>stand from w/c at sink.  Pt completed all transfers and BADLs at supervision level.  Focus on transfers, standing balance, sit<>stand, activity tolerance, safety awareness and continued discharge planning.  Therapy Documentation Precautions:  Precautions Precautions: Fall Precaution Comments: LLE hemiplegia Restrictions Weight Bearing Restrictions: No Pain: Pain Assessment Pain Assessment: No/denies pain  See FIM for current functional status  Therapy/Group: Individual Therapy  Leroy Libman 04/27/2014, 8:01 AM

## 2014-04-28 LAB — GLUCOSE, CAPILLARY
GLUCOSE-CAPILLARY: 59 mg/dL — AB (ref 70–99)
Glucose-Capillary: 54 mg/dL — ABNORMAL LOW (ref 70–99)
Glucose-Capillary: 85 mg/dL (ref 70–99)

## 2014-04-28 NOTE — Progress Notes (Signed)
Social Work  Discharge Note  The overall goal for the admission was met for:   Discharge location: Yes - home with wife who can provide 24/7 assistance  Length of Stay: Yes - 17 days  Discharge activity level: Yes - supervision overall  Home/community participation: Yes  Services provided included: MD, RD, PT, OT, SLP, RN, TR, Pharmacy, [place]: Medicare  Follow-up services arranged: Home Health: PT, OT via [place], DME: 18x18 lightweight w/c, cushion, tall rolling walker and drop arm commode via [place] and Patient/Family has no preference for HH/DME agencies  Comments (or additional information):  Patient/Family verbalized understanding of follow-up arrangements: Yes  Individual responsible for coordination of the follow-up plan: patient  Confirmed correct DME delivered: Falicia Lizotte 04/28/2014    [place], [place]

## 2014-04-28 NOTE — Progress Notes (Signed)
  [facility]     PROGRESS NOTE    Subjective/Complaints: He feels well and is eager to go home. Objective: Vital Signs: Blood pressure 105/62, pulse 65, temperature 98.7 F (37.1 C), temperature source Oral, resp. rate 18, height 6\' 4"  (1.93 m), weight 197 lb 1.5 oz (89.4 kg), SpO2 98 %. Physical Exam:  Pleasant male in no acute distress. Chest clear to auscultation. Cardiac exam S1 and S2 are regular. Surgical wounds without drainage or erythema.  Assessment/Plan: 1. Metastatic small cell cancer. Evaluation for primary appears unrevealing. He is eager to go home. Follow up plans been arranged. A FACE TO FACE EVALUATION WAS PERFORMED  Brett Darko HENRY 04/28/2014 9:03 AM

## 2014-04-28 NOTE — Progress Notes (Signed)
Pt. Got d/c instructions,follow up appointments and prescriptions.Pt.ready to go home with wife.

## 2014-04-30 ENCOUNTER — Ambulatory Visit
Admit: 2014-04-30 | Discharge: 2014-04-30 | Disposition: A | Discharge status: Home / Self Care | Payer: Medicare Other | Attending: Radiation Oncology | Admitting: Radiation Oncology

## 2014-04-30 DIAGNOSIS — C61 Malignant neoplasm of prostate: Secondary | ICD-10-CM | POA: Diagnosis not present

## 2014-04-30 DIAGNOSIS — Z510 Encounter for antineoplastic radiation therapy: Secondary | ICD-10-CM | POA: Diagnosis present

## 2014-05-01 ENCOUNTER — Ambulatory Visit
Admit: 2014-05-01 | Discharge: 2014-05-01 | Disposition: A | Discharge status: Home / Self Care | Payer: Medicare Other | Attending: Radiation Oncology | Admitting: Radiation Oncology

## 2014-05-01 DIAGNOSIS — Z510 Encounter for antineoplastic radiation therapy: Secondary | ICD-10-CM | POA: Diagnosis not present

## 2014-05-01 NOTE — Progress Notes (Signed)
  Radiation Oncology         (336) [phone] ________________________________  Name: Ivan Stark MRN: [id number]  Date: 05/02/2014  DOB: 1946-03-27  Weekly Radiation Therapy Management    ICD-9-CM ICD-10-CM   1. 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 Metastatic cancer to brain 198.3 C79.31     Current Dose: 15 Gy     Planned Dose:  35 Gy  Narrative . . . . . . . . The patient presents for routine under treatment assessment.                                   The patient is without complaint.                                 Set-up films were reviewed.                                 The chart was checked. Physical Findings. . .  weight is 187 lb 11.2 oz (85.14 kg). His blood pressure is 110/65 and his pulse is 79. His respiration is 16. . Weight essentially stable.  No significant changes. Impression . . . . . . . The patient is tolerating radiation. Plan . . . . . . . . . . . . Continue treatment as planned. Tapered to dex 2 bid.  ________________________________  Sheral Apley. Tammi Klippel, M.D.

## 2014-05-02 ENCOUNTER — Ambulatory Visit
Admit: 2014-05-02 | Discharge: 2014-05-02 | Disposition: A | Discharge status: Home / Self Care | Payer: Medicare Other | Attending: Radiation Oncology | Admitting: Radiation Oncology

## 2014-05-02 ENCOUNTER — Encounter: Payer: Self-pay | Admitting: Radiation Oncology

## 2014-05-02 ENCOUNTER — Ambulatory Visit: Payer: Self-pay | Admitting: Physician Assistant

## 2014-05-02 VITALS — BP 110/65 | HR 79 | Resp 16 | Wt 187.7 lb

## 2014-05-02 DIAGNOSIS — C61 Malignant neoplasm of prostate: Secondary | ICD-10-CM

## 2014-05-02 DIAGNOSIS — D320 Benign neoplasm of cerebral meninges: Secondary | ICD-10-CM

## 2014-05-02 DIAGNOSIS — Z510 Encounter for antineoplastic radiation therapy: Secondary | ICD-10-CM | POA: Diagnosis not present

## 2014-05-02 DIAGNOSIS — C7931 Secondary malignant neoplasm of brain: Secondary | ICD-10-CM

## 2014-05-02 MED ORDER — DEXAMETHASONE 4 MG PO TABS: 2.0000 mg | ORAL_TABLET | Freq: Two times a day (BID) | ORAL | Status: DC

## 2014-05-02 NOTE — Addendum Note (Signed)
Encounter addended by: Heywood Footman, RN on: 05/02/2014  2:29 PM<BR>     Documentation filed: Medications

## 2014-05-02 NOTE — Progress Notes (Signed)
Reports he does physical therapy three times per week. Patient able to stand for weight with one person aid. Ten pound weight loss noted since 04/24/2014. Patient and his wife confirm his appetite is enormous. Will continue to monitor weight. Denies pain. Describes a strong steady urine stream. Denies dysuria or hematuria. Denies diarrhea. Reports nocturia x3. Denies headache, nausea, vomiting, diplopia or floaters. Reports blurred vision.   Oriented patient and his wife to staff and routine of the clinic. Provided patient with RADIATION THERAPY AND YOU handbook then, reviewed pertinent information. Educated patient reference potential side effects and management such as, fatigue, skin changes, hair loss, urinary/bladder changes, and diarrhea. Patient verbalized understanding of all reviewed.

## 2014-05-04 ENCOUNTER — Ambulatory Visit: Payer: Medicare Other

## 2014-05-07 ENCOUNTER — Ambulatory Visit
Admit: 2014-05-07 | Discharge: 2014-05-07 | Disposition: A | Discharge status: Home / Self Care | Payer: Medicare Other | Attending: Radiation Oncology | Admitting: Radiation Oncology

## 2014-05-07 DIAGNOSIS — Z510 Encounter for antineoplastic radiation therapy: Secondary | ICD-10-CM | POA: Diagnosis not present

## 2014-05-08 ENCOUNTER — Telehealth: Payer: Self-pay | Admitting: Nurse Practitioner

## 2014-05-08 ENCOUNTER — Ambulatory Visit (HOSPITAL_BASED_OUTPATIENT_CLINIC_OR_DEPARTMENT_OTHER): Payer: Medicare Other | Admitting: Nurse Practitioner

## 2014-05-08 ENCOUNTER — Ambulatory Visit
Admission: RE | Admit: 2014-05-08 | Discharge: 2014-05-08 | Disposition: A | Discharge status: Home / Self Care | Payer: Medicare Other | Source: Ambulatory Visit | Attending: Radiation Oncology | Admitting: Radiation Oncology

## 2014-05-08 VITALS — BP 130/74 | HR 90 | Temp 97.8°F | Resp 22 | Ht 76.0 in | Wt 192.6 lb

## 2014-05-08 DIAGNOSIS — Z510 Encounter for antineoplastic radiation therapy: Secondary | ICD-10-CM | POA: Diagnosis not present

## 2014-05-08 DIAGNOSIS — C61 Malignant neoplasm of prostate: Secondary | ICD-10-CM

## 2014-05-08 DIAGNOSIS — C7931 Secondary malignant neoplasm of brain: Secondary | ICD-10-CM

## 2014-05-08 NOTE — Progress Notes (Addendum)
  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 He is currently completing radiation to the brain and prostate.  He overall feels well. He has a good appetite. He denies pain. No constipation or diarrhea. Bowels moving regularly. No nausea or vomiting. No problems with urination. Left leg weakness is better. He is ambulating with a walker. He continues physical therapy at home.  Objective:  Vital signs in last 24 hours:  Blood pressure 130/74, pulse 90, temperature 97.8 F (36.6 C), temperature source Oral, resp. rate 22, height 6\' 4"  (1.93 m), weight 192 lb 9.6 oz (87.363 kg), SpO2 97 %.    HEENT: no thrush or ulcers. Resp: lungs clear bilaterally. Cardio: regular rate and rhythm. GI: abdomen soft and nontender. No hepatomegaly. Vascular: trace edema at the left ankle. Calves soft and nontender. Neuro: able to move the left leg. Difficult to assess motor strength due to the presence of a leg brace.    Lab Results:  Lab Results  Component Value Date   WBC 10.3 04/12/2014   HGB 11.4* 04/12/2014   HCT 34.3* 04/12/2014   MCV 89.3 04/12/2014   PLT 306 04/12/2014   NEUTROABS 7.8* 04/12/2014    Imaging:  No results found.  Medications: I have reviewed the patient's current medications.  Assessment/Plan: 1. 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, status post resection 04/06/2014  Staging CTs of the chest, abdomen, and pelvis 04/12/2014 confirmed a prostate mass  Prostate ultrasound 04/16/2014 confirmed a prostate mass with a biopsy consistent with small cell carcinoma  Extrinsic appearing cecal mass confirmed on colonoscopy 04/19/2014 with a biopsy revealing small cell carcinoma 2. Left frontal  meningioma, WHO grade 1, status post resection 04/06/2014 3. Left leg weakness secondary to #1 4. 1 cmbrain lesion at the inferior falx noted on the MRI 03/21/2014, not resected 5. Diabetes  6. Hypertension  7. Hyperlipidemia   Disposition: Ivan Stark appears stable. He is currently completing radiation to the prostate and brain. He is scheduled to complete radiation on 05/16/2014. Dr. Benay Spice recommends initiation of chemotherapy with carboplatin/etoposide on a 4 week schedule following completion of the radiation.  We reviewed potential toxicities associated with the chemotherapy including myelosuppression, nausea/vomiting, diarrhea, mouth sores, hair loss, allergic reaction. He was provided with written information as well. He will attend a chemotherapy education class.  He will receive Neulasta support. We reviewed potential toxicities associated with Neulasta including bone pain, rash, splenic rupture.  He will return to begin cycle 1 carboplatin/etoposide on 05/21/2014. We will see him in followup on 05/30/2014 with a CBC. He understands to contact the office between visits with any problems.   Patient seen with Dr. Benay Spice. 25 minutes were spent face-to-face at today's visit with the majority of that time involving counseling/coordination of care.   Ned Card ANP/GNP-BC   05/08/2014  4:13 PM   This was a sheared visit with Ned Card. Ivan Stark will complete palliative radiation. He will then begin systemic therapy with etoposide and carboplatin. We reviewed the rationale behind systemic chemotherapy and the potential toxicities associated with this regimen in detail with Ivan Stark and his wife. He agrees to proceed.  [name], M.D.

## 2014-05-08 NOTE — Patient Instructions (Signed)
Etoposide, VP-16 injection  What is this medicine?  ETOPOSIDE, VP-16 (e toe POE side) is a chemotherapy drug. It is used to treat testicular cancer, lung cancer, and other cancers.  This medicine may be used for other purposes; ask your health care provider or pharmacist if you have questions.  COMMON BRAND NAME(S): Etopophos, Toposar, VePesid  What should I tell my health care provider before I take this medicine?  They need to know if you have any of these conditions:  -infection  -kidney disease  -low blood counts, like low white cell, platelet, or red cell counts  -an unusual or allergic reaction to etoposide, other chemotherapeutic agents, other medicines, foods, dyes, or preservatives  -pregnant or trying to get pregnant  -breast-feeding  How should I use this medicine?  This medicine is for infusion into a vein. It is administered in a hospital or clinic by a specially trained health care professional.  Talk to your pediatrician regarding the use of this medicine in children. Special care may be needed.  Overdosage: If you think you have taken too much of this medicine contact a poison control center or emergency room at once.  NOTE: This medicine is only for you. Do not share this medicine with others.  What if I miss a dose?  It is important not to miss your dose. Call your doctor or health care professional if you are unable to keep an appointment.  What may interact with this medicine?  -cyclosporine  -medicines to increase blood counts like filgrastim, pegfilgrastim, sargramostim  -vaccines  This list may not describe all possible interactions. Give your health care provider a list of all the medicines, herbs, non-prescription drugs, or dietary supplements you use. Also tell them if you smoke, drink alcohol, or use illegal drugs. Some items may interact with your medicine.  What should I watch for while using this medicine?  Visit your doctor for checks on your progress. This drug may make you feel  generally unwell. This is not uncommon, as chemotherapy can affect healthy cells as well as cancer cells. Report any side effects. Continue your course of treatment even though you feel ill unless your doctor tells you to stop.  In some cases, you may be given additional medicines to help with side effects. Follow all directions for their use.  Call your doctor or health care professional for advice if you get a fever, chills or sore throat, or other symptoms of a cold or flu. Do not treat yourself. This drug decreases your body's ability to fight infections. Try to avoid being around people who are sick.  This medicine may increase your risk to bruise or bleed. Call your doctor or health care professional if you notice any unusual bleeding.  Be careful brushing and flossing your teeth or using a toothpick because you may get an infection or bleed more easily. If you have any dental work done, tell your dentist you are receiving this medicine.  Avoid taking products that contain aspirin, acetaminophen, ibuprofen, naproxen, or ketoprofen unless instructed by your doctor. These medicines may hide a fever.  Do not become pregnant while taking this medicine. Women should inform their doctor if they wish to become pregnant or think they might be pregnant. There is a potential for serious side effects to an unborn child. Talk to your health care professional or pharmacist for more information. Do not breast-feed an infant while taking this medicine.  What side effects may I notice from receiving this   medicine?  Side effects that you should report to your doctor or health care professional as soon as possible:  -allergic reactions like skin rash, itching or hives, swelling of the face, lips, or tongue  -low blood counts - this medicine may decrease the number of white blood cells, red blood cells and platelets. You may be at increased risk for infections and bleeding.  -signs of infection - fever or chills, cough, sore  throat, pain or difficulty passing urine  -signs of decreased platelets or bleeding - bruising, pinpoint red spots on the skin, black, tarry stools, blood in the urine  -signs of decreased red blood cells - unusually weak or tired, fainting spells, lightheadedness  -breathing problems  -changes in vision  -mouth or throat sores or ulcers  -pain, redness, swelling or irritation at the injection site  -pain, tingling, numbness in the hands or feet  -redness, blistering, peeling or loosening of the skin, including inside the mouth  -seizures  -vomiting  Side effects that usually do not require medical attention (report to your doctor or health care professional if they continue or are bothersome):  -diarrhea  -hair loss  -loss of appetite  -nausea  -stomach pain  This list may not describe all possible side effects. Call your doctor for medical advice about side effects. You may report side effects to FDA at 1-800-FDA-1088.  Where should I keep my medicine?  This drug is given in a hospital or clinic and will not be stored at home.  NOTE: This sheet is a summary. It may not cover all possible information. If you have questions about this medicine, talk to your doctor, pharmacist, or health care provider.   2015, Elsevier/Gold Standard. (2007-09-26 17:24:12)  Carboplatin injection  What is this medicine?  CARBOPLATIN (KAR boe pla tin) is a chemotherapy drug. It targets fast dividing cells, like cancer cells, and causes these cells to die. This medicine is used to treat ovarian cancer and many other cancers.  This medicine may be used for other purposes; ask your health care provider or pharmacist if you have questions.  COMMON BRAND NAME(S): Paraplatin  What should I tell my health care provider before I take this medicine?  They need to know if you have any of these conditions:  -blood disorders  -hearing problems  -kidney disease  -recent or ongoing radiation therapy  -an unusual or allergic reaction to carboplatin,  cisplatin, other chemotherapy, other medicines, foods, dyes, or preservatives  -pregnant or trying to get pregnant  -breast-feeding  How should I use this medicine?  This drug is usually given as an infusion into a vein. It is administered in a hospital or clinic by a specially trained health care professional.  Talk to your pediatrician regarding the use of this medicine in children. Special care may be needed.  Overdosage: If you think you have taken too much of this medicine contact a poison control center or emergency room at once.  NOTE: This medicine is only for you. Do not share this medicine with others.  What if I miss a dose?  It is important not to miss a dose. Call your doctor or health care professional if you are unable to keep an appointment.  What may interact with this medicine?  -medicines for seizures  -medicines to increase blood counts like filgrastim, pegfilgrastim, sargramostim  -some antibiotics like amikacin, gentamicin, neomycin, streptomycin, tobramycin  -vaccines  Talk to your doctor or health care professional before taking any of these   medicines:  -acetaminophen  -aspirin  -ibuprofen  -ketoprofen  -naproxen  This list may not describe all possible interactions. Give your health care provider a list of all the medicines, herbs, non-prescription drugs, or dietary supplements you use. Also tell them if you smoke, drink alcohol, or use illegal drugs. Some items may interact with your medicine.  What should I watch for while using this medicine?  Your condition will be monitored carefully while you are receiving this medicine. You will need important blood work done while you are taking this medicine.  This drug may make you feel generally unwell. This is not uncommon, as chemotherapy can affect healthy cells as well as cancer cells. Report any side effects. Continue your course of treatment even though you feel ill unless your doctor tells you to stop.  In some cases, you may be given  additional medicines to help with side effects. Follow all directions for their use.  Call your doctor or health care professional for advice if you get a fever, chills or sore throat, or other symptoms of a cold or flu. Do not treat yourself. This drug decreases your body's ability to fight infections. Try to avoid being around people who are sick.  This medicine may increase your risk to bruise or bleed. Call your doctor or health care professional if you notice any unusual bleeding.  Be careful brushing and flossing your teeth or using a toothpick because you may get an infection or bleed more easily. If you have any dental work done, tell your dentist you are receiving this medicine.  Avoid taking products that contain aspirin, acetaminophen, ibuprofen, naproxen, or ketoprofen unless instructed by your doctor. These medicines may hide a fever.  Do not become pregnant while taking this medicine. Women should inform their doctor if they wish to become pregnant or think they might be pregnant. There is a potential for serious side effects to an unborn child. Talk to your health care professional or pharmacist for more information. Do not breast-feed an infant while taking this medicine.  What side effects may I notice from receiving this medicine?  Side effects that you should report to your doctor or health care professional as soon as possible:  -allergic reactions like skin rash, itching or hives, swelling of the face, lips, or tongue  -signs of infection - fever or chills, cough, sore throat, pain or difficulty passing urine  -signs of decreased platelets or bleeding - bruising, pinpoint red spots on the skin, black, tarry stools, nosebleeds  -signs of decreased red blood cells - unusually weak or tired, fainting spells, lightheadedness  -breathing problems  -changes in hearing  -changes in vision  -chest pain  -high blood pressure  -low blood counts - This drug may decrease the number of white blood cells, red  blood cells and platelets. You may be at increased risk for infections and bleeding.  -nausea and vomiting  -pain, swelling, redness or irritation at the injection site  -pain, tingling, numbness in the hands or feet  -problems with balance, talking, walking  -trouble passing urine or change in the amount of urine  Side effects that usually do not require medical attention (report to your doctor or health care professional if they continue or are bothersome):  -hair loss  -loss of appetite  -metallic taste in the mouth or changes in taste  This list may not describe all possible side effects. Call your doctor for medical advice about side effects. You may report side   effects to FDA at 1-800-FDA-1088.  Where should I keep my medicine?  This drug is given in a hospital or clinic and will not be stored at home.  NOTE: This sheet is a summary. It may not cover all possible information. If you have questions about this medicine, talk to your doctor, pharmacist, or health care provider.   2015, Elsevier/Gold Standard. (2007-08-30 14:38:05)

## 2014-05-08 NOTE — Telephone Encounter (Signed)
Pt confirmed labs/ov per 12/01 POF, gave pt AVS.... KJ,sent msg to add chemo °

## 2014-05-09 ENCOUNTER — Telehealth: Payer: Self-pay | Admitting: *Deleted

## 2014-05-09 ENCOUNTER — Ambulatory Visit
Admit: 2014-05-09 | Discharge: 2014-05-09 | Disposition: A | Discharge status: Home / Self Care | Payer: Medicare Other | Attending: Radiation Oncology | Admitting: Radiation Oncology

## 2014-05-09 DIAGNOSIS — Z510 Encounter for antineoplastic radiation therapy: Secondary | ICD-10-CM | POA: Diagnosis not present

## 2014-05-09 NOTE — Telephone Encounter (Signed)
Per staff message and POF I have scheduled appts. Advised scheduler of appts. JMW  

## 2014-05-10 ENCOUNTER — Ambulatory Visit
Admit: 2014-05-10 | Discharge: 2014-05-10 | Disposition: A | Discharge status: Home / Self Care | Payer: Medicare Other | Attending: Radiation Oncology | Admitting: Radiation Oncology

## 2014-05-10 DIAGNOSIS — Z510 Encounter for antineoplastic radiation therapy: Secondary | ICD-10-CM | POA: Diagnosis not present

## 2014-05-11 ENCOUNTER — Encounter: Payer: Self-pay | Admitting: Radiation Oncology

## 2014-05-11 ENCOUNTER — Ambulatory Visit
Admission: RE | Admit: 2014-05-11 | Discharge: 2014-05-11 | Disposition: A | Discharge status: Home / Self Care | Payer: Medicare Other | Source: Ambulatory Visit | Attending: Radiation Oncology | Admitting: Radiation Oncology

## 2014-05-11 ENCOUNTER — Ambulatory Visit
Admit: 2014-05-11 | Discharge: 2014-05-11 | Disposition: A | Discharge status: Home / Self Care | Payer: Medicare Other | Attending: Radiation Oncology | Admitting: Radiation Oncology

## 2014-05-11 VITALS — BP 106/73 | HR 88 | Resp 16 | Wt 195.1 lb

## 2014-05-11 DIAGNOSIS — C7931 Secondary malignant neoplasm of brain: Secondary | ICD-10-CM

## 2014-05-11 DIAGNOSIS — C61 Malignant neoplasm of prostate: Secondary | ICD-10-CM

## 2014-05-11 DIAGNOSIS — Z510 Encounter for antineoplastic radiation therapy: Secondary | ICD-10-CM | POA: Diagnosis not present

## 2014-05-11 NOTE — Progress Notes (Signed)
Taking decadron 2 mg bid. No thrush noted.

## 2014-05-11 NOTE — Progress Notes (Signed)
Patient riding in wheelchair today but, uses walker to ambulate independently at home. Reports only mild fatigue. Denies pain. Weight and vitals stable. Hyperpigmentation with dry desquamation to scalp. Provided patient with tube of radiaplex and directed upon use. Encouraged patient to use baby shampoo as well. Reports scalp is tender. Alopecia of scalp noted. Denies headache, dizziness, nausea, vomiting, diplopia or ringing in the ears. Denies dysuria. Reports frequency. Reports nocturia x4. Denies incontinence.

## 2014-05-11 NOTE — Progress Notes (Signed)
  Radiation Oncology         (336) [phone] ________________________________  Name: Ivan Stark MRN: [id number]  Date: 05/11/2014  DOB: 1946-05-16  Weekly Radiation Therapy Management    ICD-9-CM ICD-10-CM   1. Brain metastases 198.3 C79.31   2. 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 . . . . . . . The patient presents for routine under treatment assessment.                                   Taking decadron 2 mg bid. No thrush noted.Patient riding in wheelchair today but, uses walker to ambulate independently at home. Reports only mild fatigue. Denies pain. Weight and vitals stable. Hyperpigmentation with dry desquamation to scalp. Provided patient with tube of radiaplex and directed upon use. Encouraged patient to use baby shampoo as well. Reports scalp is tender. Alopecia of scalp noted. Denies headache, dizziness, nausea, vomiting, diplopia or ringing in the ears. Denies dysuria. Reports frequency. Reports nocturia x4. Denies incontinence.                                  Set-up films were reviewed.                                 The chart was checked. Physical Findings. . .  weight is 195 lb 1.6 oz (88.497 kg). His blood pressure is 106/73 and his pulse is 88. His respiration is 16. . Weight essentially stable.  No significant changes. Impression . . . . . . . The patient is tolerating radiation. Plan . . . . . . . . . . . . Continue treatment as planned. Tapered to dex 2 QD for one week.  ________________________________  Sheral Apley. Tammi Klippel, M.D.

## 2014-05-14 ENCOUNTER — Ambulatory Visit
Admit: 2014-05-14 | Discharge: 2014-05-14 | Disposition: A | Discharge status: Home / Self Care | Payer: Medicare Other | Attending: Radiation Oncology | Admitting: Radiation Oncology

## 2014-05-14 DIAGNOSIS — C7931 Secondary malignant neoplasm of brain: Secondary | ICD-10-CM

## 2014-05-14 DIAGNOSIS — Z510 Encounter for antineoplastic radiation therapy: Secondary | ICD-10-CM | POA: Diagnosis not present

## 2014-05-15 ENCOUNTER — Ambulatory Visit: Payer: Medicare Other

## 2014-05-15 ENCOUNTER — Ambulatory Visit
Admission: RE | Admit: 2014-05-15 | Discharge: 2014-05-15 | Disposition: A | Discharge status: Home / Self Care | Payer: Medicare Other | Source: Ambulatory Visit | Attending: Radiation Oncology | Admitting: Radiation Oncology

## 2014-05-15 DIAGNOSIS — Z510 Encounter for antineoplastic radiation therapy: Secondary | ICD-10-CM | POA: Diagnosis not present

## 2014-05-15 MED ORDER — BIAFINE EX EMUL
Freq: Two times a day (BID) | CUTANEOUS | Status: AC
  Administered 2014-05-15: 14:00:00 via TOPICAL

## 2014-05-16 ENCOUNTER — Ambulatory Visit
Admission: RE | Admit: 2014-05-16 | Discharge: 2014-05-16 | Disposition: A | Discharge status: Home / Self Care | Payer: Medicare Other | Source: Ambulatory Visit | Attending: Radiation Oncology | Admitting: Radiation Oncology

## 2014-05-16 ENCOUNTER — Other Ambulatory Visit: Payer: Medicare Other

## 2014-05-16 ENCOUNTER — Other Ambulatory Visit: Payer: Self-pay | Admitting: *Deleted

## 2014-05-16 ENCOUNTER — Other Ambulatory Visit (HOSPITAL_BASED_OUTPATIENT_CLINIC_OR_DEPARTMENT_OTHER): Payer: Medicare Other

## 2014-05-16 ENCOUNTER — Encounter: Payer: Self-pay | Admitting: Radiation Oncology

## 2014-05-16 ENCOUNTER — Ambulatory Visit: Payer: Medicare Other

## 2014-05-16 ENCOUNTER — Encounter: Payer: Self-pay | Admitting: *Deleted

## 2014-05-16 DIAGNOSIS — Z510 Encounter for antineoplastic radiation therapy: Secondary | ICD-10-CM | POA: Diagnosis not present

## 2014-05-16 DIAGNOSIS — C61 Malignant neoplasm of prostate: Secondary | ICD-10-CM

## 2014-05-16 DIAGNOSIS — C7931 Secondary malignant neoplasm of brain: Secondary | ICD-10-CM

## 2014-05-16 LAB — COMPREHENSIVE METABOLIC PANEL (CC13)
ALT: 18 U/L (ref 0–55)
ANION GAP: 11 meq/L (ref 3–11)
AST: 12 U/L (ref 5–34)
Albumin: 2.3 g/dL — ABNORMAL LOW (ref 3.5–5.0)
Alkaline Phosphatase: 72 U/L (ref 40–150)
BUN: 14.3 mg/dL (ref 7.0–26.0)
CALCIUM: 9.4 mg/dL (ref 8.4–10.4)
CHLORIDE: 103 meq/L (ref 98–109)
CO2: 24 mEq/L (ref 22–29)
Creatinine: 0.9 mg/dL (ref 0.7–1.3)
EGFR: 89 mL/min/{1.73_m2} — ABNORMAL LOW (ref >90)
Glucose: 243 mg/dl — ABNORMAL HIGH (ref 70–140)
Potassium: 4 mEq/L (ref 3.5–5.1)
SODIUM: 138 meq/L (ref 136–145)
TOTAL PROTEIN: 5.6 g/dL — AB (ref 6.4–8.3)
Total Bilirubin: 0.49 mg/dL (ref 0.20–1.20)

## 2014-05-16 LAB — CBC WITH DIFFERENTIAL/PLATELET
BASO%: 0.4 % (ref 0.0–2.0)
Basophils Absolute: 0 10*3/uL (ref 0.0–0.1)
EOS%: 0.3 % (ref 0.0–7.0)
Eosinophils Absolute: 0 10*3/uL (ref 0.0–0.5)
HEMATOCRIT: 38.1 % — AB (ref 38.4–49.9)
HGB: 12.3 g/dL — ABNORMAL LOW (ref 13.0–17.1)
LYMPH#: 0.3 10*3/uL — AB (ref 0.9–3.3)
LYMPH%: 3.5 % — ABNORMAL LOW (ref 14.0–49.0)
MCH: 29.5 pg (ref 27.2–33.4)
MCHC: 32.2 g/dL (ref 32.0–36.0)
MCV: 91.6 fL (ref 79.3–98.0)
MONO#: 0.6 10*3/uL (ref 0.1–0.9)
MONO%: 6.3 % (ref 0.0–14.0)
NEUT#: 8.6 10*3/uL — ABNORMAL HIGH (ref 1.5–6.5)
NEUT%: 89.5 % — AB (ref 39.0–75.0)
Platelets: 284 10*3/uL (ref 140–400)
RBC: 4.16 10*6/uL — AB (ref 4.20–5.82)
RDW: 14.6 % (ref 11.0–14.6)
WBC: 9.6 10*3/uL (ref 4.0–10.3)

## 2014-05-16 MED ORDER — PROCHLORPERAZINE MALEATE 10 MG PO TABS
10.0000 mg | ORAL_TABLET | Freq: Four times a day (QID) | ORAL | Status: DC | PRN
Start: 2014-05-16 — End: 2015-01-26

## 2014-05-16 NOTE — Progress Notes (Signed)
  Radiation Oncology         [phone]) [phone] ________________________________  Name: Ivan Stark MRN: [id number]  Date: 05/16/2014  DOB: August 23, 1945  End of Treatment Note    ICD-9-CM ICD-10-CM   1. Metastatic cancer to brain 198.3 C79.31   2. 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  The whole brain was treated to 35 Gy in 14 fractions of 2.5 Gy 2.  The enlarged symptomatic prostate mass was treated to 35 Gy in 14 fractions of 2.5 Gy  Beams/energy:    1.  Right and Left radiation fields were treated using 6 MV X-rays with custom MLC collimation to shield the eyes and face.  The patient was immobilized with a thermoplastic mask and isocenter was verified with weekly port films. 2.  The enlarged symptomatic prostate mass was treated using a 4 field technique employing 18 megavolt photons carefully conformed around the prosthetic mass while maximally sparing bladder rectum small bowel and hips.  Narrative: The patient tolerated radiation treatment relatively well.   His defecation symptoms improved during radiation indicating some regression of the prostate mass.  Plan: The patient has completed radiation treatment. The patient will return to radiation oncology clinic for routine followup in one month. I advised them to call or return sooner if they have any questions or concerns related to their recovery or treatment. ________________________________  Sheral Apley. Tammi Klippel, M.D.

## 2014-05-17 ENCOUNTER — Ambulatory Visit: Payer: Medicare Other

## 2014-05-20 ENCOUNTER — Other Ambulatory Visit: Payer: Self-pay | Admitting: Oncology

## 2014-05-21 ENCOUNTER — Other Ambulatory Visit: Payer: Self-pay | Admitting: *Deleted

## 2014-05-21 ENCOUNTER — Ambulatory Visit (HOSPITAL_BASED_OUTPATIENT_CLINIC_OR_DEPARTMENT_OTHER): Payer: Medicare Other | Admitting: Nurse Practitioner

## 2014-05-21 ENCOUNTER — Ambulatory Visit (HOSPITAL_BASED_OUTPATIENT_CLINIC_OR_DEPARTMENT_OTHER): Payer: Medicare Other

## 2014-05-21 ENCOUNTER — Other Ambulatory Visit: Payer: Self-pay | Admitting: Nurse Practitioner

## 2014-05-21 ENCOUNTER — Telehealth: Payer: Self-pay | Admitting: Nurse Practitioner

## 2014-05-21 DIAGNOSIS — E860 Dehydration: Secondary | ICD-10-CM

## 2014-05-21 DIAGNOSIS — C61 Malignant neoplasm of prostate: Secondary | ICD-10-CM

## 2014-05-21 DIAGNOSIS — I9589 Other hypotension: Secondary | ICD-10-CM

## 2014-05-21 DIAGNOSIS — I959 Hypotension, unspecified: Secondary | ICD-10-CM

## 2014-05-21 DIAGNOSIS — C7931 Secondary malignant neoplasm of brain: Secondary | ICD-10-CM

## 2014-05-21 DIAGNOSIS — R630 Anorexia: Secondary | ICD-10-CM

## 2014-05-21 DIAGNOSIS — Z7409 Other reduced mobility: Secondary | ICD-10-CM

## 2014-05-21 DIAGNOSIS — E8809 Other disorders of plasma-protein metabolism, not elsewhere classified: Secondary | ICD-10-CM

## 2014-05-21 DIAGNOSIS — R234 Changes in skin texture: Secondary | ICD-10-CM

## 2014-05-21 LAB — COMPREHENSIVE METABOLIC PANEL (CC13)
ALBUMIN: 2.1 g/dL — AB (ref 3.5–5.0)
ALT: 17 U/L (ref 0–55)
AST: 11 U/L (ref 5–34)
Alkaline Phosphatase: 66 U/L (ref 40–150)
Anion Gap: 9 mEq/L (ref 3–11)
BILIRUBIN TOTAL: 0.23 mg/dL (ref 0.20–1.20)
BUN: 15.4 mg/dL (ref 7.0–26.0)
CO2: 24 mEq/L (ref 22–29)
Calcium: 8.8 mg/dL (ref 8.4–10.4)
Chloride: 103 mEq/L (ref 98–109)
Creatinine: 0.8 mg/dL (ref 0.7–1.3)
EGFR: >90 mL/min/{1.73_m2} (ref >90)
Glucose: 183 mg/dl — ABNORMAL HIGH (ref 70–140)
POTASSIUM: 4.2 meq/L (ref 3.5–5.1)
SODIUM: 136 meq/L (ref 136–145)
Total Protein: 5.2 g/dL — ABNORMAL LOW (ref 6.4–8.3)

## 2014-05-21 LAB — CBC WITH DIFFERENTIAL/PLATELET
BASO%: 0.2 % (ref 0.0–2.0)
Basophils Absolute: 0 10*3/uL (ref 0.0–0.1)
EOS%: 0.2 % (ref 0.0–7.0)
Eosinophils Absolute: 0 10*3/uL (ref 0.0–0.5)
HCT: 33.8 % — ABNORMAL LOW (ref 38.4–49.9)
HGB: 10.8 g/dL — ABNORMAL LOW (ref 13.0–17.1)
LYMPH%: 4.8 % — AB (ref 14.0–49.0)
MCH: 29 pg (ref 27.2–33.4)
MCHC: 32.1 g/dL (ref 32.0–36.0)
MCV: 90.4 fL (ref 79.3–98.0)
MONO#: 0.3 10*3/uL (ref 0.1–0.9)
MONO%: 3.8 % (ref 0.0–14.0)
NEUT#: 6.9 10*3/uL — ABNORMAL HIGH (ref 1.5–6.5)
NEUT%: 91 % — ABNORMAL HIGH (ref 39.0–75.0)
PLATELETS: 322 10*3/uL (ref 140–400)
RBC: 3.73 10*6/uL — AB (ref 4.20–5.82)
RDW: 14.2 % (ref 11.0–14.6)
WBC: 7.5 10*3/uL (ref 4.0–10.3)
lymph#: 0.4 10*3/uL — ABNORMAL LOW (ref 0.9–3.3)

## 2014-05-21 MED ORDER — SODIUM CHLORIDE 0.9 % IV SOLN
1000.0000 mL | Freq: Once | INTRAVENOUS | Status: AC
  Administered 2014-05-21: 1000 mL via INTRAVENOUS

## 2014-05-21 MED ORDER — PRAVASTATIN SODIUM 40 MG PO TABS: 40.0000 mg | ORAL_TABLET | Freq: Every day | ORAL | Status: DC

## 2014-05-21 NOTE — Progress Notes (Signed)
Pt in for first time chemo.  Upon arrival in wheelchair pt was noted to be extremely weak.  Person with patient stated that Friday when the physical therapist was with pt his blood pressure dropped to the point where the therapist almost called the paramedics. Pt does take b/p meds but did not take them today and his b/p was very low.  Pt states that his appetite and energy level is low as well. RN notified Ned Card NP who suggested Granville Lewis FNP see pt.  Chemo held today and 1 L IVF given to patient per Granville Lewis FNP orders.

## 2014-05-21 NOTE — Patient Instructions (Addendum)
YOUR CHEMOTHERAPY HAS BEEN RESCHEDULED FOR TOMORROW, 05/23/15 AT 1:00PM. PLEASE HOLD YOUR BP MEDICATIONS TONIGHT.  Dehydration, Adult Dehydration means your body does not have as much fluid as it needs. Your kidneys, brain, and heart will not work properly without the right amount of fluids and salt.  HOME CARE  Ask your doctor how to replace body fluid losses (rehydrate).  Drink enough fluids to keep your pee (urine) clear or pale yellow.  Drink small amounts of fluids often if you feel sick to your stomach (nauseous) or throw up (vomit).  Eat like you normally do.  Avoid:  Foods or drinks high in sugar.  Bubbly (carbonated) drinks.  Juice.  Very hot or cold fluids.  Drinks with caffeine.  Fatty, greasy foods.  Alcohol.  Tobacco.  Eating too much.  Gelatin desserts.  Wash your hands to avoid spreading germs (bacteria, viruses).  Only take medicine as told by your doctor.  Keep all doctor visits as told. GET HELP RIGHT AWAY IF:   You cannot drink something without throwing up.  You get worse even with treatment.  Your vomit has blood in it or looks greenish.  Your poop (stool) has blood in it or looks black and tarry.  You have not peed in 6 to 8 hours.  You pee a small amount of very dark pee.  You have a fever.  You pass out (faint).  You have belly (abdominal) pain that gets worse or stays in one spot (localizes).  You have a rash, stiff neck, or bad headache.  You get easily annoyed, sleepy, or are hard to wake up.  You feel weak, dizzy, or very thirsty. MAKE SURE YOU:   Understand these instructions.  Will watch your condition.  Will get help right away if you are not doing well or get worse. Document Released: 03/21/2009 Document Revised: 08/17/2011 Document Reviewed: 01/12/2011 [facility] Patient Information 2015 [place], [place]. This information is not intended to replace advice given to you by your health care provider. Make sure you  discuss any questions you have with your health care provider.

## 2014-05-21 NOTE — Telephone Encounter (Signed)
, °

## 2014-05-22 ENCOUNTER — Encounter: Payer: Self-pay | Admitting: Nurse Practitioner

## 2014-05-22 ENCOUNTER — Ambulatory Visit (HOSPITAL_BASED_OUTPATIENT_CLINIC_OR_DEPARTMENT_OTHER): Payer: Medicare Other | Admitting: Nurse Practitioner

## 2014-05-22 ENCOUNTER — Ambulatory Visit (HOSPITAL_BASED_OUTPATIENT_CLINIC_OR_DEPARTMENT_OTHER): Payer: Medicare Other

## 2014-05-22 ENCOUNTER — Other Ambulatory Visit: Payer: Self-pay | Admitting: Nurse Practitioner

## 2014-05-22 ENCOUNTER — Telehealth: Payer: Self-pay | Admitting: Nurse Practitioner

## 2014-05-22 VITALS — BP 117/70 | HR 82 | Temp 97.9°F | Resp 18 | Ht 76.0 in | Wt 187.3 lb

## 2014-05-22 DIAGNOSIS — C61 Malignant neoplasm of prostate: Secondary | ICD-10-CM

## 2014-05-22 DIAGNOSIS — Z5111 Encounter for antineoplastic chemotherapy: Secondary | ICD-10-CM

## 2014-05-22 DIAGNOSIS — C7931 Secondary malignant neoplasm of brain: Secondary | ICD-10-CM

## 2014-05-22 DIAGNOSIS — I959 Hypotension, unspecified: Secondary | ICD-10-CM | POA: Insufficient documentation

## 2014-05-22 DIAGNOSIS — E8809 Other disorders of plasma-protein metabolism, not elsewhere classified: Secondary | ICD-10-CM | POA: Insufficient documentation

## 2014-05-22 MED ORDER — ONDANSETRON 16 MG/50ML IVPB (CHCC)
16.0000 mg | Freq: Once | INTRAVENOUS | Status: AC
  Administered 2014-05-22: 16 mg via INTRAVENOUS

## 2014-05-22 MED ORDER — DEXAMETHASONE SODIUM PHOSPHATE 20 MG/5ML IJ SOLN
20.0000 mg | Freq: Once | INTRAMUSCULAR | Status: AC
Start: 2014-05-22 — End: 2014-05-22
  Administered 2014-05-22: 20 mg via INTRAVENOUS

## 2014-05-22 MED ORDER — ONDANSETRON 16 MG/50ML IVPB (CHCC)
INTRAVENOUS | Status: AC
  Filled 2014-05-22: qty 16

## 2014-05-22 MED ORDER — SODIUM CHLORIDE 0.9 % IV SOLN
568.0000 mg | Freq: Once | INTRAVENOUS | Status: AC
  Administered 2014-05-22: 570 mg via INTRAVENOUS
  Filled 2014-05-22: qty 57

## 2014-05-22 MED ORDER — DEXAMETHASONE SODIUM PHOSPHATE 20 MG/5ML IJ SOLN
INTRAMUSCULAR | Status: AC
  Filled 2014-05-22: qty 5

## 2014-05-22 MED ORDER — SODIUM CHLORIDE 0.9 % IV SOLN
100.0000 mg/m2 | Freq: Once | INTRAVENOUS | Status: AC
  Administered 2014-05-22: 220 mg via INTRAVENOUS
  Filled 2014-05-22: qty 11

## 2014-05-22 MED ORDER — SODIUM CHLORIDE 0.9 % IV SOLN
Freq: Once | INTRAVENOUS | Status: AC
  Administered 2014-05-22: 14:00:00 via INTRAVENOUS

## 2014-05-22 NOTE — Progress Notes (Signed)
will   SYMPTOM MANAGEMENT CLINIC   HPI: Ivan Stark 68 y.o. male diagnosed with prostate cancer with brain metastasis.  Here today to initiate her planned/etoposide chemotherapy regimen.  Patient presented yesterday for initiation of his new chemotherapy regimen; but was found to be hypotensive and dehydrated.  Patient received 1 L normal saline IV fluid rehydration yesterday; and was instructed to hold his blood pressure medication last night.  Patient states he feels much better this morning; and is prepared to initiate his chemotherapy.  He denies any further dehydration symptoms; he also denies any recent fevers or chills.   HPI  CURRENT THERAPY: Upcoming Treatment Dates - LUNG Carboplatin D1 / Etoposide D1-3 q28d Days with orders from any treatment category:  05/23/2014      SCHEDULING COMMUNICATION      ondansetron (ZOFRAN) IVPB 8 mg      dexamethasone (DECADRON) injection 10 mg      etoposide (VEPESID) 220 mg in sodium chloride 0.9 % 600 mL chemo infusion      sodium chloride 0.9 % injection 10 mL      heparin lock flush 100 unit/mL      heparin lock flush 100 unit/mL      alteplase (CATHFLO ACTIVASE) injection 2 mg      sodium chloride 0.9 % injection 3 mL      Hot Pack 1 packet      0.9 %  sodium chloride infusion 05/24/2014      SCHEDULING COMMUNICATION      ondansetron (ZOFRAN) IVPB 8 mg      dexamethasone (DECADRON) injection 10 mg      etoposide (VEPESID) 220 mg in sodium chloride 0.9 % 600 mL chemo infusion      sodium chloride 0.9 % injection 10 mL      heparin lock flush 100 unit/mL      heparin lock flush 100 unit/mL      alteplase (CATHFLO ACTIVASE) injection 2 mg      sodium chloride 0.9 % injection 3 mL      Hot Pack 1 packet      0.9 %  sodium chloride infusion 05/25/2014      SCHEDULING COMMUNICATION INJECTION      pegfilgrastim (NEULASTA) injection 6 mg    ROS  Past Medical History  Diagnosis Date  . Diabetes mellitus without complication   .  Hypertension   . Hyperlipidemia   . GERD (gastroesophageal reflux disease)   . Vitamin D deficiency   . History of colon polyps   . Complication of anesthesia     Difficult to wake up; and severe nausea and vomiting  . PONV (postoperative nausea and vomiting)     Past Surgical History  Procedure Laterality Date  . Tonsillectomy    . Colonoscopy w/ polypectomy    . Esophagus surgery      Had esophagus stretched due to food getting trapped in his throat  . Craniotomy Bilateral 04/06/2014    Procedure: Bilateral Fronto-parietal craniotomy for resection of tumors with Curve;  Surgeon: Consuella Lose, MD;  Location: [facility];  Service: Neurosurgery;  Laterality: Bilateral;  Bilateral Fronto-parietal craniotomy for resection of tumors with Curve  . 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     is allergic to atenolol and lipitor.    Medication List       This list is accurate as of: 05/22/14  4:44 PM.  Always use your most recent med list.               ALPRAZolam 0.5 MG tablet  Commonly known as:  XANAX  Take 1 tablet (0.5 mg total) by mouth 2 (two) times daily as needed for anxiety.     aspirin EC 81 MG tablet  Take 81 mg by mouth daily.     calcium-vitamin D 500-200 MG-UNIT per tablet  Commonly known as:  OSCAL WITH D  Take 1 tablet by mouth 2 (two) times daily.     dexamethasone 2 MG  tablet  Commonly known as:  DECADRON  Take 2 mg by mouth 2 (two) times daily.     dexamethasone 4 MG tablet  Commonly known as:  DECADRON  Take 0.5 tablets (2 mg total) by mouth 2 (two) times daily.     emollient cream  Commonly known as:  BIAFINE  Apply topically as needed.     enalapril 20 MG tablet  Commonly known as:  VASOTEC  Take 1 tablet (20 mg total) by mouth daily.     feeding supplement (PRO-STAT SUGAR FREE 64) Liqd  Take 30 mLs by mouth 3 (three) times daily with meals.     glimepiride 4 MG tablet  Commonly known as:  AMARYL  Take 0.5 tablets (2 mg total) by mouth 2 (two) times daily.     HYDROcodone-acetaminophen 5-325 MG per tablet  Commonly known as:  NORCO/VICODIN  Take 1 tablet by mouth every 6 (six) hours as needed for moderate pain.     levETIRAcetam 500 MG tablet  Commonly known as:  KEPPRA  Take 1 tablet (500 mg total) by mouth 2 (two) times daily.     metFORMIN 500 MG 24 hr tablet  Commonly known as:  GLUCOPHAGE-XR  Take 1 tablet (500 mg total) by mouth 2 (two) times daily with breakfast and lunch.     metformin 1000 MG (OSM) 24 hr tablet  Commonly known as:  FORTAMET  Take 1 tablet (1,000 mg total) by mouth daily with supper.     multivitamin with minerals Tabs tablet  Take 1 tablet by mouth daily.     pantoprazole 40 MG tablet  Commonly known as:  PROTONIX  Take 1 tablet (40 mg total) by mouth at bedtime.     polyethylene glycol packet  Commonly known as:  MIRALAX / GLYCOLAX  Take 60 g by mouth 2 (two) times daily.     pravastatin 40 MG tablet  Commonly known as:  PRAVACHOL  Take 1 tablet (40 mg total) by mouth at bedtime.     prochlorperazine 10 MG tablet  Commonly known as:  COMPAZINE  Take 1 tablet (10 mg total) by mouth every 6 (six) hours as needed for nausea or vomiting.     senna 8.6 MG Tabs tablet  Commonly known as:  SENOKOT  Take 2 tablets (17.2 mg total) by mouth 2 (two) times daily. For constipation. Adjust as  needed       simethicone 80 MG chewable tablet  Commonly known as:  MYLICON  Chew 1  tablet (80 mg total) by mouth 4 (four) times daily as needed for flatulence.     vitamin C 1000 MG tablet  Take 1,000 mg by mouth daily.     Vitamin D 2000 UNITS Caps  Take 4,000 Units by mouth daily.         PHYSICAL EXAMINATION  Blood pressure 117/70, pulse 82, temperature 97.9 F (36.6 C), temperature source Oral, resp. rate 18, height 6' 4"  (1.93 m), weight 187 lb 4.8 oz (84.959 kg).  Physical Exam  Constitutional: He is oriented to person, place, and time. Vital signs are normal. He appears unhealthy.  HENT:  Head: Normocephalic.  Eyes: Conjunctivae and EOM are normal. Pupils are equal, round, and reactive to light. Right eye exhibits no discharge. Left eye exhibits no discharge. No scleral icterus.  Neck: Normal range of motion.  Pulmonary/Chest: Effort normal. No respiratory distress.  Musculoskeletal: Normal range of motion.  Neurological: He is alert and oriented to person, place, and time.  Psychiatric: Affect normal.  Nursing note and vitals reviewed.   LABORATORY DATA:. Appointment on 05/21/2014  Component Date Value Ref Range Status  . WBC 05/21/2014 7.5  4.0 - 10.3 10e3/uL Final  . NEUT# 05/21/2014 6.9* 1.5 - 6.5 10e3/uL Final  . HGB 05/21/2014 10.8* 13.0 - 17.1 g/dL Final  . HCT 05/21/2014 33.8* 38.4 - 49.9 % Final  . Platelets 05/21/2014 322  140 - 400 10e3/uL Final  . MCV 05/21/2014 90.4  79.3 - 98.0 fL Final  . MCH 05/21/2014 29.0  27.2 - 33.4 pg Final  . MCHC 05/21/2014 32.1  32.0 - 36.0 g/dL Final  . RBC 05/21/2014 3.73* 4.20 - 5.82 10e6/uL Final  . RDW 05/21/2014 14.2  11.0 - 14.6 % Final  . lymph# 05/21/2014 0.4* 0.9 - 3.3 10e3/uL Final  . MONO# 05/21/2014 0.3  0.1 - 0.9 10e3/uL Final  . Eosinophils Absolute 05/21/2014 0.0  0.0 - 0.5 10e3/uL Final  . Basophils Absolute 05/21/2014 0.0  0.0 - 0.1 10e3/uL Final  . NEUT% 05/21/2014 91.0* 39.0 - 75.0 % Final  . LYMPH% 05/21/2014  4.8* 14.0 - 49.0 % Final  . MONO% 05/21/2014 3.8  0.0 - 14.0 % Final  . EOS% 05/21/2014 0.2  0.0 - 7.0 % Final  . BASO% 05/21/2014 0.2  0.0 - 2.0 % Final  . Sodium 05/21/2014 136  136 - 145 mEq/L Final  . Potassium 05/21/2014 4.2  3.5 - 5.1 mEq/L Final  . Chloride 05/21/2014 103  98 - 109 mEq/L Final  . CO2 05/21/2014 24  22 - 29 mEq/L Final  . Glucose 05/21/2014 183* 70 - 140 mg/dl Final  . BUN 05/21/2014 15.4  7.0 - 26.0 mg/dL Final  . Creatinine 05/21/2014 0.8  0.7 - 1.3 mg/dL Final  . Total Bilirubin 05/21/2014 0.23  0.20 - 1.20 mg/dL Final  . Alkaline Phosphatase 05/21/2014 66  40 - 150 U/L Final  . AST 05/21/2014 11  5 - 34 U/L Final  . ALT 05/21/2014 17  0 - 55 U/L Final  . Total Protein 05/21/2014 5.2* 6.4 - 8.3 g/dL Final  . Albumin 05/21/2014 2.1* 3.5 - 5.0 g/dL Final  . Calcium 05/21/2014 8.8  8.4 - 10.4 mg/dL Final  . Anion Gap 05/21/2014 9  3 - 11 mEq/L Final  . EGFR 05/21/2014 >90  >90 ml/min/1.73 m2 Final   eGFR is calculated using the CKD-EPI Creatinine Equation (2009)     RADIOGRAPHIC STUDIES: No results found.  ASSESSMENT/PLAN:    Hypotension  Blood pressure was 117/70 upon arrival to the [facility] today.  Patient states that he did hold his enalapril last night.  Patient has no further symptoms of hypotension today.  Advised patient to continue to hold the enalapril for the time being.  Patient's wife stated that she would call patient's primary care provider to inform them of the intention to hold off future blood pressure medication.  Small cell carcinoma of prostate Cycle 1 of patient's complaint/etoposide was held yesterday due to hypotension with a blood pressure of 89/64 and dehydration.  Patient received 1 L normal saline IV fluid yesterday; and was instructed to hold his enalapril last night.  Patient presented back to the [facility] today; states he is feeling much better.  He no longer feels dehydrated.  Blood pressure today was 117/70.  Advised  patient to continue to hold the enalapril for the time being.  Patient will return tomorrow for cycle 1, day 2 of the same regimen.  He will also continue to take dexamethasone 2 mg on a daily basis for the next week; prior to discontinuing the dexamethasone completely.  Patient stated understanding of all instructions; and was in agreement with this plan of care. The patient knows to call the clinic with any problems, questions or concerns.   Review/collaboration with Drs. Sherrill and Devon regarding all aspects of patient's visit today.   Total time spent with patient was 15 minutes;  with greater than 75 percent of that time spent in face to face counseling regarding his symptoms, and coordination of care and follow up.  Disclaimer: This note was dictated with voice recognition software. Similar sounding words can inadvertently be transcribed and may not be corrected upon review.   Drue Second, NP 05/22/2014

## 2014-05-22 NOTE — Assessment & Plan Note (Signed)
Patient completed radiation treatments on 05/16/2014.  He has some radiation-induced desquamation of the skin to his scalp and around the edges of his upper face.  He has some healing scabs to the top of the scalp as well.  No active infection noted.  Patient states that he is already being given multiple creams to apply to this area per radiation oncology; and plans to use them again tonight.

## 2014-05-22 NOTE — Assessment & Plan Note (Signed)
Patient has been diagnosed with brain metastasis.  He has currently been tapered down to dexamethasone 2 mg every day for 1 more week; prior to discontinuing dexamethasone altogether.  Patient denies any neurological changes recently.

## 2014-05-22 NOTE — Assessment & Plan Note (Signed)
Cycle 1 of patient's complaint/etoposide was held yesterday due to hypotension with a blood pressure of 89/64 and dehydration.  Patient received 1 L normal saline IV fluid yesterday; and was instructed to hold his enalapril last night.  Patient presented back to the [facility] today; states he is feeling much better.  He no longer feels dehydrated.  Blood pressure today was 117/70.  Advised patient to continue to hold the enalapril for the time being.  Patient will return tomorrow for cycle 1, day 2 of the same regimen.  He will also continue to take dexamethasone 2 mg on a daily basis for the next week; prior to discontinuing the dexamethasone completely.

## 2014-05-22 NOTE — Assessment & Plan Note (Signed)
Blood pressure was 117/70 upon arrival to the [facility] today.  Patient states that he did hold his enalapril last night.  Patient has no further symptoms of hypotension today.  Advised patient to continue to hold the enalapril for the time being.  Patient's wife stated that she would call patient's primary care provider to inform them of the intention to hold off future blood pressure medication.

## 2014-05-22 NOTE — Assessment & Plan Note (Signed)
Albumin was noted to be even lower today.  Patient was encouraged to push protein in his diet is much as possible.

## 2014-05-22 NOTE — Telephone Encounter (Signed)
, °

## 2014-05-22 NOTE — Assessment & Plan Note (Signed)
Patient complaining of minimal appetite; most likely secondary to anxiety related to impending chemotherapy treatments.  Patient was encouraged to eat multiple small meals throughout the day if at all possible.

## 2014-05-22 NOTE — Progress Notes (Signed)
will   SYMPTOM MANAGEMENT CLINIC   HPI: Ivan Stark 68 y.o. male diagnosed with prostate cancer; with brain metastasis.  Patient is status post radiation therapy completed on 05/16/2014.  Patient presents today to initiate cycle 1 of carboplatin/etoposide chemotherapy regimen.  Patient presented to the [facility] today to receive cycle one of his carboplatin/etoposide chemotherapy regimen.  However, on initial check of patient's blood pressure it was decreased to 89/64.  Patient affirms that he did take his enalapril as previously directed prior to bed last night.  Patient also feels slightly anxious and dehydrated.  He is complaining of minimal appetite.  Patient is noted to have some radiation-induced desquamation of his skin to his scalp; with some healing scabs to the top of his scalp.  Patient states he has been putting the creams on his scalp as directed per radiation oncology.  He continues with slight decreased mobility to his left lower extremity following the brain metastasis diagnosis.  Patient denies any recent nausea, vomiting, diarrhea, or constipation.  Patient also denies any recent fever or chills.   HPI  CURRENT THERAPY: Upcoming Treatment Dates - LUNG Carboplatin D1 / Etoposide D1-3 q28d Days with orders from any treatment category:  05/23/2014      SCHEDULING COMMUNICATION      ondansetron (ZOFRAN) IVPB 8 mg      dexamethasone (DECADRON) injection 10 mg      etoposide (VEPESID) 220 mg in sodium chloride 0.9 % 600 mL chemo infusion      sodium chloride 0.9 % injection 10 mL      heparin lock flush 100 unit/mL      heparin lock flush 100 unit/mL      alteplase (CATHFLO ACTIVASE) injection 2 mg      sodium chloride 0.9 % injection 3 mL      Hot Pack 1 packet      0.9 %  sodium chloride infusion 05/24/2014      SCHEDULING COMMUNICATION      ondansetron (ZOFRAN) IVPB 8 mg      dexamethasone (DECADRON) injection 10 mg      etoposide (VEPESID) 220 mg in sodium  chloride 0.9 % 600 mL chemo infusion      sodium chloride 0.9 % injection 10 mL      heparin lock flush 100 unit/mL      heparin lock flush 100 unit/mL      alteplase (CATHFLO ACTIVASE) injection 2 mg      sodium chloride 0.9 % injection 3 mL      Hot Pack 1 packet      0.9 %  sodium chloride infusion 05/25/2014      SCHEDULING COMMUNICATION INJECTION      pegfilgrastim (NEULASTA) injection 6 mg    ROS  Past Medical History  Diagnosis Date  . Diabetes mellitus without complication   . Hypertension   . Hyperlipidemia   . GERD (gastroesophageal reflux disease)   . Vitamin D deficiency   . History of colon polyps   . Complication of anesthesia     Difficult to wake up; and severe nausea and vomiting  . PONV (postoperative nausea and vomiting)     Past Surgical History  Procedure Laterality Date  . Tonsillectomy    . Colonoscopy w/ polypectomy    . Esophagus surgery      Had esophagus stretched due to food getting trapped in his throat  . Craniotomy Bilateral 04/06/2014    Procedure: Bilateral Fronto-parietal craniotomy for resection  of tumors with Curve;  Surgeon: Consuella Lose, MD;  Location: [facility];  Service: Neurosurgery;  Laterality: Bilateral;  Bilateral Fronto-parietal craniotomy for resection of tumors with Curve  . 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     is allergic to  atenolol and lipitor.    Medication List       This list is accurate as of: 05/21/14 11:59 PM.  Always use your most recent med list.               ALPRAZolam 0.5 MG tablet  Commonly known as:  XANAX  Take 1 tablet (0.5 mg total) by mouth 2 (two) times daily as needed for anxiety.     aspirin EC 81 MG tablet  Take 81 mg by mouth daily.     calcium-vitamin D 500-200 MG-UNIT per tablet  Commonly known as:  OSCAL WITH D  Take 1 tablet by mouth 2 (two) times daily.     dexamethasone 2 MG tablet  Commonly known as:  DECADRON  Take 2 mg by mouth 2 (two) times daily.     dexamethasone 4 MG tablet  Commonly known as:  DECADRON  Take 0.5 tablets (2 mg total) by mouth 2 (two) times daily.     emollient cream  Commonly known as:  BIAFINE  Apply topically as needed.     enalapril 20 MG tablet  Commonly known as:  VASOTEC  Take 1 tablet (20 mg total) by mouth daily.     feeding supplement (PRO-STAT SUGAR FREE 64) Liqd  Take 30 mLs by mouth 3 (three) times daily with meals.     glimepiride 4 MG tablet  Commonly known as:  AMARYL  Take 0.5 tablets (2 mg total) by mouth 2 (two) times daily.     HYDROcodone-acetaminophen 5-325 MG per tablet  Commonly known as:  NORCO/VICODIN  Take 1 tablet by mouth every 6 (six) hours as needed for moderate pain.     levETIRAcetam 500 MG tablet  Commonly known as:  KEPPRA  Take 1 tablet (500 mg total) by mouth 2 (two) times daily.     metFORMIN 500 MG 24 hr tablet  Commonly known as:  GLUCOPHAGE-XR  Take 1 tablet (500 mg total) by mouth 2 (two) times daily with breakfast and lunch.     metformin 1000 MG (OSM) 24 hr tablet  Commonly known as:  FORTAMET  Take 1 tablet (1,000 mg total) by mouth daily with supper.     multivitamin with minerals Tabs tablet  Take 1 tablet by mouth daily.     pantoprazole 40 MG tablet  Commonly known as:  PROTONIX  Take 1 tablet (40 mg total) by mouth at bedtime.     polyethylene glycol packet  Commonly  known as:  MIRALAX / GLYCOLAX  Take 60 g by mouth 2 (two) times daily.     pravastatin 40 MG tablet  Commonly known as:  PRAVACHOL  Take 1 tablet (40 mg total) by mouth at bedtime.     prochlorperazine  10 MG tablet  Commonly known as:  COMPAZINE  Take 1 tablet (10 mg total) by mouth every 6 (six) hours as needed for nausea or vomiting.     senna 8.6 MG Tabs tablet  Commonly known as:  SENOKOT  Take 2 tablets (17.2 mg total) by mouth 2 (two) times daily. For constipation. Adjust as  needed     simethicone 80 MG chewable tablet  Commonly known as:  MYLICON  Chew 1 tablet (80 mg total) by mouth 4 (four) times daily as needed for flatulence.     vitamin C 1000 MG tablet  Take 1,000 mg by mouth daily.     Vitamin D 2000 UNITS Caps  Take 4,000 Units by mouth daily.         PHYSICAL EXAMINATION  Vitals: BP 92/53, 89/64, 102/61, HR 70, temp 97.7  Physical Exam  Constitutional: He is oriented to person, place, and time. He appears dehydrated. He appears unhealthy. He appears cachectic.  HENT:  Head: Normocephalic and atraumatic.  Mouth/Throat: Oropharynx is clear and moist.  Eyes: Conjunctivae and EOM are normal. Pupils are equal, round, and reactive to light. Right eye exhibits no discharge. Left eye exhibits no discharge. No scleral icterus.  Neck: Normal range of motion. Neck supple. No JVD present. No tracheal deviation present. No thyromegaly present.  Cardiovascular: Normal rate, regular rhythm, normal heart sounds and intact distal pulses.   Pulmonary/Chest: Effort normal and breath sounds normal. No stridor. No respiratory distress. He has no wheezes. He has no rales. He exhibits no tenderness.  Abdominal: Soft. Bowel sounds are normal. He exhibits no distension and no mass. There is no tenderness. There is no rebound and no guarding.  Musculoskeletal: He exhibits no edema or tenderness.  Patient has a decreased mobility and strength to his left lower extremity as baseline  since his brain metastasis.  Lymphadenopathy:    He has no cervical adenopathy.  Neurological: He is alert and oriented to person, place, and time.  Patient does appear weak in general on exam today.  Skin: Skin is warm and dry. No rash noted. No erythema.  Patient has a dry, scaling desquamated skin to his entire scalp and bilateral ears.  He has areas of scabbed lesions that are healing to the top of the scalp.  No evidence of infection.  Psychiatric: Affect normal.  Nursing note and vitals reviewed.   LABORATORY DATA:. Appointment on 05/21/2014  Component Date Value Ref Range Status  . WBC 05/21/2014 7.5  4.0 - 10.3 10e3/uL Final  . NEUT# 05/21/2014 6.9* 1.5 - 6.5 10e3/uL Final  . HGB 05/21/2014 10.8* 13.0 - 17.1 g/dL Final  . HCT 05/21/2014 33.8* 38.4 - 49.9 % Final  . Platelets 05/21/2014 322  140 - 400 10e3/uL Final  . MCV 05/21/2014 90.4  79.3 - 98.0 fL Final  . MCH 05/21/2014 29.0  27.2 - 33.4 pg Final  . MCHC 05/21/2014 32.1  32.0 - 36.0 g/dL Final  . RBC 05/21/2014 3.73* 4.20 - 5.82 10e6/uL Final  . RDW 05/21/2014 14.2  11.0 - 14.6 % Final  . lymph# 05/21/2014 0.4* 0.9 - 3.3 10e3/uL Final  . MONO# 05/21/2014 0.3  0.1 - 0.9 10e3/uL Final  . Eosinophils Absolute 05/21/2014 0.0  0.0 - 0.5 10e3/uL Final  . Basophils Absolute 05/21/2014 0.0  0.0 - 0.1 10e3/uL Final  . NEUT% 05/21/2014 91.0* 39.0 - 75.0 % Final  . LYMPH% 05/21/2014 4.8* 14.0 - 49.0 % Final  . MONO% 05/21/2014 3.8  0.0 - 14.0 % Final  . EOS% 05/21/2014 0.2  0.0 - 7.0 % Final  . BASO% 05/21/2014 0.2  0.0 - 2.0 % Final  . Sodium 05/21/2014 136  136 - 145 mEq/L Final  . Potassium 05/21/2014 4.2  3.5 - 5.1 mEq/L Final  . Chloride 05/21/2014 103  98 - 109 mEq/L Final  . CO2 05/21/2014 24  22 - 29 mEq/L Final  . Glucose 05/21/2014 183* 70 - 140 mg/dl Final  . BUN 05/21/2014 15.4  7.0 - 26.0 mg/dL Final  . Creatinine 05/21/2014 0.8  0.7 - 1.3 mg/dL Final  . Total Bilirubin 05/21/2014 0.23  0.20 - 1.20 mg/dL Final    . Alkaline Phosphatase 05/21/2014 66  40 - 150 U/L Final  . AST 05/21/2014 11  5 - 34 U/L Final  . ALT 05/21/2014 17  0 - 55 U/L Final  . Total Protein 05/21/2014 5.2* 6.4 - 8.3 g/dL Final  . Albumin 05/21/2014 2.1* 3.5 - 5.0 g/dL Final  . Calcium 05/21/2014 8.8  8.4 - 10.4 mg/dL Final  . Anion Gap 05/21/2014 9  3 - 11 mEq/L Final  . EGFR 05/21/2014 >90  >90 ml/min/1.73 m2 Final   eGFR is calculated using the CKD-EPI Creatinine Equation (2009)     RADIOGRAPHIC STUDIES: No results found.  ASSESSMENT/PLAN:    Anorexia Patient complaining of minimal appetite; most likely secondary to anxiety related to impending chemotherapy treatments.  Patient was encouraged to eat multiple small meals throughout the day if at all possible.  Brain metastases Patient has been diagnosed with brain metastasis.  He has currently been tapered down to dexamethasone 2 mg every day for 1 more week; prior to discontinuing dexamethasone altogether.  Patient denies any neurological changes recently.  Dehydration Patient does appear mildly dehydrated today; and blood pressure was noted decreased to 89/64 on visit today.  Patient confirmed that he did take enalapril last night before bedtime as directed.  Patient was given 1 L normal saline IV fluid rehydration today.  Patient stated he felt much better following his IV fluid hydration.  Patient was also encouraged to push fluids is much as possible.  Desquamated skin Patient completed radiation treatments on 05/16/2014.  He has some radiation-induced desquamation of the skin to his scalp and around the edges of his upper face.  He has some healing scabs to the top of the scalp as well.  No active infection noted.  Patient states that he is already being given multiple creams to apply to this area per radiation oncology; and plans to use them again tonight.  Hypoalbuminemia Albumin was noted to be even lower today.  Patient was encouraged to push protein in his diet  is much as possible.  Hypotension Blood pressure was decreased to 89/64 on initial check at the cancer center.  Patient confirmed that he does continue to take enalapril every evening before bedtime as previously directed.  Patient was given 1 L normal saline IV fluid rehydration today for combination of both dehydration and hypotension.  Systolic blood pressure did improve to greater than 100.  Patient was also advised to hold any further enalapril.  Patient's wife stated that she would call patient's primary care provider to inform them the patient will continue to hold the enalapril until further notice; unless the primary care provider has a different suggestion.  Immobility Patient has had decreased mobility to his left lower extremity since his brain metastasis diagnosis.  He continues to use either a walker  or wheelchair for mobility; but is able to walk small distances at home with no assistance.  Patient may very well benefit from physical therapy.  Will place the physical therapy referral today.  Small cell carcinoma of prostate Patient completed his final radiation treatment on 05/16/2014.  He was scheduled to initiate cycle 1 of his carboplatin/etoposide chemotherapy regimen today.  However, she presented to the [facility] with her blood pressure decreased to 89/64 and 7 with mild dehydration.  Decision was made to hold the chemotherapy today; and instead give patient 1 L normal saline IV fluid rehydration.  Patient stated that he felt much better after receiving the IV fluids.  Patient's systolic blood pressure is greater than 100 following IV fluid rehydration.  Patient was advised to hold his blood pressure medication this evening; and report back to the [facility] tomorrow for a quick evaluation to see if he is able to proceed with his first cycle of chemotherapy.    Patient stated understanding of all instructions; and was in agreement with this plan of care. The patient knows to  call the clinic with any problems, questions or concerns.   Review/collaboration with Dr. Julien Nordmann regarding all aspects of patient's visit today.   Total time spent with patient was 40 minutes;  with greater than 75 percent of that time spent in face to face counseling regarding his symptoms, frequent monitoring of patient while in the infusion area, and coordination of care and follow up.  Disclaimer: This note was dictated with voice recognition software. Similar sounding words can inadvertently be transcribed and may not be corrected upon review.   Drue Second, NP 05/22/2014

## 2014-05-22 NOTE — Assessment & Plan Note (Signed)
Blood pressure was decreased to 89/64 on initial check at the cancer center.  Patient confirmed that he does continue to take enalapril every evening before bedtime as previously directed.  Patient was given 1 L normal saline IV fluid rehydration today for combination of both dehydration and hypotension.  Systolic blood pressure did improve to greater than 100.  Patient was also advised to hold any further enalapril.  Patient's wife stated that she would call patient's primary care provider to inform them the patient will continue to hold the enalapril until further notice; unless the primary care provider has a different suggestion.

## 2014-05-22 NOTE — Patient Instructions (Signed)
[facility] Discharge Instructions for Patients Receiving Chemotherapy  Today you received the following chemotherapy agents etoposide.    To help prevent nausea and vomiting after your treatment, we encourage you to take your nausea medication as directed   If you develop nausea and vomiting that is not controlled by your nausea medication, call the clinic.   BELOW ARE SYMPTOMS THAT SHOULD BE REPORTED IMMEDIATELY:  *FEVER GREATER THAN 100.5 F  *CHILLS WITH OR WITHOUT FEVER  NAUSEA AND VOMITING THAT IS NOT CONTROLLED WITH YOUR NAUSEA MEDICATION  *UNUSUAL SHORTNESS OF BREATH  *UNUSUAL BRUISING OR BLEEDING  TENDERNESS IN MOUTH AND THROAT WITH OR WITHOUT PRESENCE OF ULCERS  *URINARY PROBLEMS  *BOWEL PROBLEMS  UNUSUAL RASH Items with * indicate a potential emergency and should be followed up as soon as possible.  Feel free to call the clinic you have any questions or concerns. The clinic phone number is [phone].  Carboplatin injection What is this medicine? CARBOPLATIN (KAR boe pla tin) is a chemotherapy drug. It targets fast dividing cells, like cancer cells, and causes these cells to die. This medicine is used to treat ovarian cancer and many other cancers. This medicine may be used for other purposes; ask your health care provider or pharmacist if you have questions. COMMON BRAND NAME(S): Paraplatin What should I tell my health care provider before I take this medicine? They need to know if you have any of these conditions: -blood disorders -hearing problems -kidney disease -recent or ongoing radiation therapy -an unusual or allergic reaction to carboplatin, cisplatin, other chemotherapy, other medicines, foods, dyes, or preservatives -pregnant or trying to get pregnant -breast-feeding How should I use this medicine? This drug is usually given as an infusion into a vein. It is administered in a hospital or clinic by a specially trained health care  professional. Talk to your pediatrician regarding the use of this medicine in children. Special care may be needed. Overdosage: If you think you have taken too much of this medicine contact a poison control center or emergency room at once. NOTE: This medicine is only for you. Do not share this medicine with others. What if I miss a dose? It is important not to miss a dose. Call your doctor or health care professional if you are unable to keep an appointment. What may interact with this medicine? -medicines for seizures -medicines to increase blood counts like filgrastim, pegfilgrastim, sargramostim -some antibiotics like amikacin, gentamicin, neomycin, streptomycin, tobramycin -vaccines Talk to your doctor or health care professional before taking any of these medicines: -acetaminophen -aspirin -ibuprofen -ketoprofen -naproxen This list may not describe all possible interactions. Give your health care provider a list of all the medicines, herbs, non-prescription drugs, or dietary supplements you use. Also tell them if you smoke, drink alcohol, or use illegal drugs. Some items may interact with your medicine. What should I watch for while using this medicine? Your condition will be monitored carefully while you are receiving this medicine. You will need important blood work done while you are taking this medicine. This drug may make you feel generally unwell. This is not uncommon, as chemotherapy can affect healthy cells as well as cancer cells. Report any side effects. Continue your course of treatment even though you feel ill unless your doctor tells you to stop. In some cases, you may be given additional medicines to help with side effects. Follow all directions for their use. Call your doctor or health care professional for advice if you  get a fever, chills or sore throat, or other symptoms of a cold or flu. Do not treat yourself. This drug decreases your body's ability to fight infections.  Try to avoid being around people who are sick. This medicine may increase your risk to bruise or bleed. Call your doctor or health care professional if you notice any unusual bleeding. Be careful brushing and flossing your teeth or using a toothpick because you may get an infection or bleed more easily. If you have any dental work done, tell your dentist you are receiving this medicine. Avoid taking products that contain aspirin, acetaminophen, ibuprofen, naproxen, or ketoprofen unless instructed by your doctor. These medicines may hide a fever. Do not become pregnant while taking this medicine. Women should inform their doctor if they wish to become pregnant or think they might be pregnant. There is a potential for serious side effects to an unborn child. Talk to your health care professional or pharmacist for more information. Do not breast-feed an infant while taking this medicine. What side effects may I notice from receiving this medicine? Side effects that you should report to your doctor or health care professional as soon as possible: -allergic reactions like skin rash, itching or hives, swelling of the face, lips, or tongue -signs of infection - fever or chills, cough, sore throat, pain or difficulty passing urine -signs of decreased platelets or bleeding - bruising, pinpoint red spots on the skin, black, tarry stools, nosebleeds -signs of decreased red blood cells - unusually weak or tired, fainting spells, lightheadedness -breathing problems -changes in hearing -changes in vision -chest pain -high blood pressure -low blood counts - This drug may decrease the number of white blood cells, red blood cells and platelets. You may be at increased risk for infections and bleeding. -nausea and vomiting -pain, swelling, redness or irritation at the injection site -pain, tingling, numbness in the hands or feet -problems with balance, talking, walking -trouble passing urine or change in the  amount of urine Side effects that usually do not require medical attention (report to your doctor or health care professional if they continue or are bothersome): -hair loss -loss of appetite -metallic taste in the mouth or changes in taste This list may not describe all possible side effects. Call your doctor for medical advice about side effects. You may report side effects to FDA at 1-800-FDA-1088. Where should I keep my medicine? This drug is given in a hospital or clinic and will not be stored at home. NOTE: This sheet is a summary. It may not cover all possible information. If you have questions about this medicine, talk to your doctor, pharmacist, or health care provider.  2015, Elsevier/Gold Standard. (2007-08-30 14:38:05)  Etoposide, VP-16 injection What is this medicine? ETOPOSIDE, VP-16 (e toe POE side) is a chemotherapy drug. It is used to treat testicular cancer, lung cancer, and other cancers. This medicine may be used for other purposes; ask your health care provider or pharmacist if you have questions. COMMON BRAND NAME(S): Etopophos, Toposar, VePesid What should I tell my health care provider before I take this medicine? They need to know if you have any of these conditions: -infection -kidney disease -low blood counts, like low white cell, platelet, or red cell counts -an unusual or allergic reaction to etoposide, other chemotherapeutic agents, other medicines, foods, dyes, or preservatives -pregnant or trying to get pregnant -breast-feeding How should I use this medicine? This medicine is for infusion into a vein. It is administered in a  hospital or clinic by a specially trained health care professional. Talk to your pediatrician regarding the use of this medicine in children. Special care may be needed. Overdosage: If you think you have taken too much of this medicine contact a poison control center or emergency room at once. NOTE: This medicine is only for you. Do  not share this medicine with others. What if I miss a dose? It is important not to miss your dose. Call your doctor or health care professional if you are unable to keep an appointment. What may interact with this medicine? -cyclosporine -medicines to increase blood counts like filgrastim, pegfilgrastim, sargramostim -vaccines This list may not describe all possible interactions. Give your health care provider a list of all the medicines, herbs, non-prescription drugs, or dietary supplements you use. Also tell them if you smoke, drink alcohol, or use illegal drugs. Some items may interact with your medicine. What should I watch for while using this medicine? Visit your doctor for checks on your progress. This drug may make you feel generally unwell. This is not uncommon, as chemotherapy can affect healthy cells as well as cancer cells. Report any side effects. Continue your course of treatment even though you feel ill unless your doctor tells you to stop. In some cases, you may be given additional medicines to help with side effects. Follow all directions for their use. Call your doctor or health care professional for advice if you get a fever, chills or sore throat, or other symptoms of a cold or flu. Do not treat yourself. This drug decreases your body's ability to fight infections. Try to avoid being around people who are sick. This medicine may increase your risk to bruise or bleed. Call your doctor or health care professional if you notice any unusual bleeding. Be careful brushing and flossing your teeth or using a toothpick because you may get an infection or bleed more easily. If you have any dental work done, tell your dentist you are receiving this medicine. Avoid taking products that contain aspirin, acetaminophen, ibuprofen, naproxen, or ketoprofen unless instructed by your doctor. These medicines may hide a fever. Do not become pregnant while taking this medicine. Women should inform their  doctor if they wish to become pregnant or think they might be pregnant. There is a potential for serious side effects to an unborn child. Talk to your health care professional or pharmacist for more information. Do not breast-feed an infant while taking this medicine. What side effects may I notice from receiving this medicine? Side effects that you should report to your doctor or health care professional as soon as possible: -allergic reactions like skin rash, itching or hives, swelling of the face, lips, or tongue -low blood counts - this medicine may decrease the number of white blood cells, red blood cells and platelets. You may be at increased risk for infections and bleeding. -signs of infection - fever or chills, cough, sore throat, pain or difficulty passing urine -signs of decreased platelets or bleeding - bruising, pinpoint red spots on the skin, black, tarry stools, blood in the urine -signs of decreased red blood cells - unusually weak or tired, fainting spells, lightheadedness -breathing problems -changes in vision -mouth or throat sores or ulcers -pain, redness, swelling or irritation at the injection site -pain, tingling, numbness in the hands or feet -redness, blistering, peeling or loosening of the skin, including inside the mouth -seizures -vomiting Side effects that usually do not require medical attention (report to your doctor  or health care professional if they continue or are bothersome): -diarrhea -hair loss -loss of appetite -nausea -stomach pain This list may not describe all possible side effects. Call your doctor for medical advice about side effects. You may report side effects to FDA at 1-800-FDA-1088. Where should I keep my medicine? This drug is given in a hospital or clinic and will not be stored at home. NOTE: This sheet is a summary. It may not cover all possible information. If you have questions about this medicine, talk to your doctor, pharmacist, or  health care provider.  2015, [employer]vier/Gold Standard. (2007-09-26 17:24:12)

## 2014-05-22 NOTE — Assessment & Plan Note (Signed)
Patient completed his final radiation treatment on 05/16/2014.  He was scheduled to initiate cycle 1 of his carboplatin/etoposide chemotherapy regimen today.  However, she presented to the [facility] with her blood pressure decreased to 89/64 and 7 with mild dehydration.  Decision was made to hold the chemotherapy today; and instead give patient 1 L normal saline IV fluid rehydration.  Patient stated that he felt much better after receiving the IV fluids.  Patient's systolic blood pressure is greater than 100 following IV fluid rehydration.  Patient was advised to hold his blood pressure medication this evening; and report back to the [facility] tomorrow for a quick evaluation to see if he is able to proceed with his first cycle of chemotherapy.

## 2014-05-22 NOTE — Assessment & Plan Note (Signed)
Patient does appear mildly dehydrated today; and blood pressure was noted decreased to 89/64 on visit today.  Patient confirmed that he did take enalapril last night before bedtime as directed.  Patient was given 1 L normal saline IV fluid rehydration today.  Patient stated he felt much better following his IV fluid hydration.  Patient was also encouraged to push fluids is much as possible.

## 2014-05-22 NOTE — Assessment & Plan Note (Signed)
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  He continues to use either a walker or wheelchair for mobility; but is able to walk small distances at home with no assistance.  Patient may very well benefit from physical therapy.  Will place the physical therapy referral today.

## 2014-05-23 ENCOUNTER — Ambulatory Visit (HOSPITAL_BASED_OUTPATIENT_CLINIC_OR_DEPARTMENT_OTHER): Payer: Medicare Other

## 2014-05-23 DIAGNOSIS — C61 Malignant neoplasm of prostate: Secondary | ICD-10-CM

## 2014-05-23 DIAGNOSIS — C7931 Secondary malignant neoplasm of brain: Secondary | ICD-10-CM

## 2014-05-23 DIAGNOSIS — Z5111 Encounter for antineoplastic chemotherapy: Secondary | ICD-10-CM

## 2014-05-23 MED ORDER — SODIUM CHLORIDE 0.9 % IV SOLN
100.0000 mg/m2 | Freq: Once | INTRAVENOUS | Status: AC
  Administered 2014-05-23: 220 mg via INTRAVENOUS
  Filled 2014-05-23: qty 11

## 2014-05-23 MED ORDER — SODIUM CHLORIDE 0.9 % IV SOLN
Freq: Once | INTRAVENOUS | Status: AC
  Administered 2014-05-23: 14:00:00 via INTRAVENOUS

## 2014-05-23 MED ORDER — DEXAMETHASONE SODIUM PHOSPHATE 10 MG/ML IJ SOLN
10.0000 mg | Freq: Once | INTRAMUSCULAR | Status: AC
  Administered 2014-05-23: 10 mg via INTRAVENOUS

## 2014-05-23 MED ORDER — ONDANSETRON 8 MG/NS 50 ML IVPB
INTRAVENOUS | Status: AC
  Filled 2014-05-23: qty 8

## 2014-05-23 MED ORDER — ONDANSETRON 8 MG/50ML IVPB (CHCC)
8.0000 mg | Freq: Once | INTRAVENOUS | Status: AC
  Administered 2014-05-23: 8 mg via INTRAVENOUS

## 2014-05-23 MED ORDER — DEXAMETHASONE SODIUM PHOSPHATE 10 MG/ML IJ SOLN
INTRAMUSCULAR | Status: AC
  Filled 2014-05-23: qty 1

## 2014-05-23 NOTE — Patient Instructions (Signed)
[facility] Discharge Instructions for Patients Receiving Chemotherapy  Today you received the following chemotherapy agents VP-16  To help prevent nausea and vomiting after your treatment, we encourage you to take your nausea medication     If you develop nausea and vomiting that is not controlled by your nausea medication, call the clinic.   BELOW ARE SYMPTOMS THAT SHOULD BE REPORTED IMMEDIATELY:  *FEVER GREATER THAN 100.5 F  *CHILLS WITH OR WITHOUT FEVER  NAUSEA AND VOMITING THAT IS NOT CONTROLLED WITH YOUR NAUSEA MEDICATION  *UNUSUAL SHORTNESS OF BREATH  *UNUSUAL BRUISING OR BLEEDING  TENDERNESS IN MOUTH AND THROAT WITH OR WITHOUT PRESENCE OF ULCERS  *URINARY PROBLEMS  *BOWEL PROBLEMS  UNUSUAL RASH Items with * indicate a potential emergency and should be followed up as soon as possible.  Feel free to call the clinic you have any questions or concerns. The clinic phone number is [phone].    

## 2014-05-24 ENCOUNTER — Ambulatory Visit: Payer: Medicare Other

## 2014-05-24 ENCOUNTER — Ambulatory Visit (HOSPITAL_BASED_OUTPATIENT_CLINIC_OR_DEPARTMENT_OTHER): Payer: Medicare Other

## 2014-05-24 DIAGNOSIS — Z5111 Encounter for antineoplastic chemotherapy: Secondary | ICD-10-CM

## 2014-05-24 DIAGNOSIS — C61 Malignant neoplasm of prostate: Secondary | ICD-10-CM

## 2014-05-24 DIAGNOSIS — C7931 Secondary malignant neoplasm of brain: Secondary | ICD-10-CM

## 2014-05-24 MED ORDER — DEXAMETHASONE SODIUM PHOSPHATE 10 MG/ML IJ SOLN
INTRAMUSCULAR | Status: AC
  Filled 2014-05-24: qty 1

## 2014-05-24 MED ORDER — ONDANSETRON 8 MG/NS 50 ML IVPB
INTRAVENOUS | Status: AC
  Filled 2014-05-24: qty 8

## 2014-05-24 MED ORDER — SODIUM CHLORIDE 0.9 % IV SOLN
100.0000 mg/m2 | Freq: Once | INTRAVENOUS | Status: AC
  Administered 2014-05-24: 220 mg via INTRAVENOUS
  Filled 2014-05-24: qty 11

## 2014-05-24 MED ORDER — ONDANSETRON 8 MG/50ML IVPB (CHCC)
8.0000 mg | Freq: Once | INTRAVENOUS | Status: AC
  Administered 2014-05-24: 8 mg via INTRAVENOUS

## 2014-05-24 MED ORDER — DEXAMETHASONE SODIUM PHOSPHATE 10 MG/ML IJ SOLN
10.0000 mg | Freq: Once | INTRAMUSCULAR | Status: AC
  Administered 2014-05-24: 10 mg via INTRAVENOUS

## 2014-05-24 MED ORDER — SODIUM CHLORIDE 0.9 % IV SOLN
Freq: Once | INTRAVENOUS | Status: AC
  Administered 2014-05-24: 15:00:00 via INTRAVENOUS

## 2014-05-24 NOTE — Patient Instructions (Signed)
[facility] Discharge Instructions for Patients Receiving Chemotherapy  Today you received the following chemotherapy agents Etoposide.  To help prevent nausea and vomiting after your treatment, we encourage you to take your nausea medication.   If you develop nausea and vomiting that is not controlled by your nausea medication, call the clinic.   BELOW ARE SYMPTOMS THAT SHOULD BE REPORTED IMMEDIATELY:  *FEVER GREATER THAN 100.5 F  *CHILLS WITH OR WITHOUT FEVER  NAUSEA AND VOMITING THAT IS NOT CONTROLLED WITH YOUR NAUSEA MEDICATION  *UNUSUAL SHORTNESS OF BREATH  *UNUSUAL BRUISING OR BLEEDING  TENDERNESS IN MOUTH AND THROAT WITH OR WITHOUT PRESENCE OF ULCERS  *URINARY PROBLEMS  *BOWEL PROBLEMS  UNUSUAL RASH Items with * indicate a potential emergency and should be followed up as soon as possible.  Feel free to call the clinic you have any questions or concerns. The clinic phone number is [phone].    

## 2014-05-25 ENCOUNTER — Ambulatory Visit (HOSPITAL_BASED_OUTPATIENT_CLINIC_OR_DEPARTMENT_OTHER): Payer: Medicare Other

## 2014-05-25 ENCOUNTER — Telehealth: Payer: Self-pay | Admitting: *Deleted

## 2014-05-25 DIAGNOSIS — C61 Malignant neoplasm of prostate: Secondary | ICD-10-CM

## 2014-05-25 DIAGNOSIS — Z5189 Encounter for other specified aftercare: Secondary | ICD-10-CM

## 2014-05-25 MED ORDER — PEGFILGRASTIM INJECTION 6 MG/0.6ML ~~LOC~~
6.0000 mg | PREFILLED_SYRINGE | Freq: Once | SUBCUTANEOUS | Status: AC
  Administered 2014-05-25: 6 mg via SUBCUTANEOUS
  Filled 2014-05-25: qty 0.6

## 2014-05-25 NOTE — Patient Instructions (Signed)
Pegfilgrastim injection What is this medicine? PEGFILGRASTIM (peg fil GRA stim) is a long-acting granulocyte colony-stimulating factor that stimulates the growth of neutrophils, a type of white blood cell important in the body's fight against infection. It is used to reduce the incidence of fever and infection in patients with certain types of cancer who are receiving chemotherapy that affects the bone marrow. This medicine may be used for other purposes; ask your health care provider or pharmacist if you have questions. COMMON BRAND NAME(S): Neulasta What should I tell my health care provider before I take this medicine? They need to know if you have any of these conditions: -latex allergy -ongoing radiation therapy -sickle cell disease -skin reactions to acrylic adhesives (On-Body Injector only) -an unusual or allergic reaction to pegfilgrastim, filgrastim, other medicines, foods, dyes, or preservatives -pregnant or trying to get pregnant -breast-feeding How should I use this medicine? This medicine is for injection under the skin. If you get this medicine at home, you will be taught how to prepare and give the pre-filled syringe or how to use the On-body Injector. Refer to the patient Instructions for Use for detailed instructions. Use exactly as directed. Take your medicine at regular intervals. Do not take your medicine more often than directed. It is important that you put your used needles and syringes in a special sharps container. Do not put them in a trash can. If you do not have a sharps container, call your pharmacist or healthcare provider to get one. Talk to your pediatrician regarding the use of this medicine in children. Special care may be needed. Overdosage: If you think you have taken too much of this medicine contact a poison control center or emergency room at once. NOTE: This medicine is only for you. Do not share this medicine with others. What if I miss a dose? It is  important not to miss your dose. Call your doctor or health care professional if you miss your dose. If you miss a dose due to an On-body Injector failure or leakage, a new dose should be administered as soon as possible using a single prefilled syringe for manual use. What may interact with this medicine? Interactions have not been studied. Give your health care provider a list of all the medicines, herbs, non-prescription drugs, or dietary supplements you use. Also tell them if you smoke, drink alcohol, or use illegal drugs. Some items may interact with your medicine. This list may not describe all possible interactions. Give your health care provider a list of all the medicines, herbs, non-prescription drugs, or dietary supplements you use. Also tell them if you smoke, drink alcohol, or use illegal drugs. Some items may interact with your medicine. What should I watch for while using this medicine? You may need blood work done while you are taking this medicine. If you are going to need a MRI, CT scan, or other procedure, tell your doctor that you are using this medicine (On-Body Injector only). What side effects may I notice from receiving this medicine? Side effects that you should report to your doctor or health care professional as soon as possible: -allergic reactions like skin rash, itching or hives, swelling of the face, lips, or tongue -dizziness -fever -pain, redness, or irritation at site where injected -pinpoint red spots on the skin -shortness of breath or breathing problems -stomach or side pain, or pain at the shoulder -swelling -tiredness -trouble passing urine Side effects that usually do not require medical attention (report to your doctor   or health care professional if they continue or are bothersome): -bone pain -muscle pain This list may not describe all possible side effects. Call your doctor for medical advice about side effects. You may report side effects to FDA at  1-800-FDA-1088. Where should I keep my medicine? Keep out of the reach of children. Store pre-filled syringes in a refrigerator between 2 and 8 degrees C (36 and 46 degrees F). Do not freeze. Keep in carton to protect from light. Throw away this medicine if it is left out of the refrigerator for more than 48 hours. Throw away any unused medicine after the expiration date. NOTE: This sheet is a summary. It may not cover all possible information. If you have questions about this medicine, talk to your doctor, pharmacist, or health care provider.  2015, [employer]vier/[employer] Standard. (2013-08-24 16:14:05)  

## 2014-05-25 NOTE — Telephone Encounter (Signed)
NO PROBLEMS OR QUESTIONS AT THIS TIME. PT. WILL CALL THIS OFFICE IF THE NEED ARISES. 

## 2014-05-26 DIAGNOSIS — C7932 Secondary malignant neoplasm of cerebral meninges: Secondary | ICD-10-CM

## 2014-05-26 DIAGNOSIS — C7800 Secondary malignant neoplasm of unspecified lung: Secondary | ICD-10-CM

## 2014-05-26 DIAGNOSIS — C61 Malignant neoplasm of prostate: Secondary | ICD-10-CM

## 2014-05-26 DIAGNOSIS — E119 Type 2 diabetes mellitus without complications: Secondary | ICD-10-CM

## 2014-05-26 DIAGNOSIS — C785 Secondary malignant neoplasm of large intestine and rectum: Secondary | ICD-10-CM

## 2014-05-27 ENCOUNTER — Encounter: Payer: Self-pay | Admitting: Radiation Oncology

## 2014-05-27 NOTE — Progress Notes (Signed)
  Radiation Oncology         [phone] ________________________________  Name: Ivan Stark MRN: [id number]  Date: 04/24/2014  DOB: [birth year]  Simulation Verification Note   Status: outpatient  NARRATIVE: The patient was brought to the treatment unit and placed in the planned treatment position. The clinical setup was verified. Then port films were obtained and uploaded to the radiation oncology medical record software.  The treatment beams were carefully compared against the planned radiation fields. The position location and shape of the radiation fields was reviewed. They targeted volume of tissue appears to be appropriately covered by the radiation beams. Organs at risk appear to be excluded as planned.  Based on my personal review, I approved the simulation verification. The patient's treatment will proceed as planned.  ------------------------------------------------  Sheral Apley Tammi Klippel, M.D.

## 2014-05-29 ENCOUNTER — Encounter: Payer: Self-pay | Admitting: Physician Assistant

## 2014-05-29 ENCOUNTER — Ambulatory Visit (INDEPENDENT_AMBULATORY_CARE_PROVIDER_SITE_OTHER): Payer: Medicare Other | Admitting: Physician Assistant

## 2014-05-29 VITALS — BP 122/72 | HR 84 | Temp 98.3°F | Resp 16 | Ht 76.0 in | Wt 194.0 lb

## 2014-05-29 DIAGNOSIS — C61 Malignant neoplasm of prostate: Secondary | ICD-10-CM

## 2014-05-29 DIAGNOSIS — E1122 Type 2 diabetes mellitus with diabetic chronic kidney disease: Secondary | ICD-10-CM

## 2014-05-29 DIAGNOSIS — R350 Frequency of micturition: Secondary | ICD-10-CM

## 2014-05-29 DIAGNOSIS — I959 Hypotension, unspecified: Secondary | ICD-10-CM

## 2014-05-29 DIAGNOSIS — E785 Hyperlipidemia, unspecified: Secondary | ICD-10-CM

## 2014-05-29 DIAGNOSIS — Z7409 Other reduced mobility: Secondary | ICD-10-CM

## 2014-05-29 DIAGNOSIS — E8809 Other disorders of plasma-protein metabolism, not elsewhere classified: Secondary | ICD-10-CM

## 2014-05-29 DIAGNOSIS — N181 Chronic kidney disease, stage 1: Secondary | ICD-10-CM

## 2014-05-29 NOTE — Progress Notes (Signed)
Assessment and Plan:  Hypertension with recent hypotension, agree with holding enalapril during chemo and monitoring BP at home. Continue DASH diet.  Reminder to go to the ER if any CP, SOB, nausea, dizziness, severe HA, changes vision/speech, left arm numbness and tingling and jaw pain. Cholesterol: Continue diet and exercise. Can stop while getting chemo Diabetes without complications-Continue diet and exercise. Check A1C- long discussion about sugars, start checking 2 times day, if not eating he will stop the amaryl.  Protein malnutrition- increase protein, boost samples given Urinary frequency- ? OAB/spasm- rule out infection, given myrebtriq samples, will ask oncology if he can use Prostate cancer with mets- continue follow up, doing rather well other than fatigue- monitor anemia He declines labs here which is understandable, and state will get an oncologist  Follow up 3 months or sooner if needed Continue diet and meds as discussed. Further disposition pending results of labs. Discussed med's effects and SE's.  HPI 68 y.o. male  presents for 3 month follow up with hypertension, hyperlipidemia, diabetes and vitamin D. His blood pressure has been controlled at home, today their BP is BP: 122/72 mmHg He does not workout. He denies chest pain, shortness of breath, dizziness.  He is not on cholesterol medication and denies myalgias. His cholesterol is at goal. The cholesterol was:  02/05/2014: Cholesterol, Total 264*; HDL Cholesterol by NMR 38*; LDL (calc) 180*; Triglycerides 229* He has not been working on diet and exercise for Diabetes without complications, he is on bASA, he is not on ACE/ARB due to hypotension, and denies paresthesia of the feet, polydipsia and polyuria. He has been on decadron but it has been increased due to this, he monitors it at home and has been on amaryl 4mg  1/2 pill BID, metformin 500 ER 4 x a day. Last A1C was: 02/05/2014: Hemoglobin-A1c 7.9* Patient is on Vitamin D  supplement. 02/05/2014: Vit D, 25-Hydroxy 90*  Patient was in the hospital in Nov for metastatic small cell cancer with mets. He is now following with Dr. Adam Phenix, he has had one round of chemo and has finished radiation with Dr. Tammi Klippel on 05/16/2014. He states that he is very fatigued, has had hypotension, is off his enalapril.  He has xanax but is not taking it.  He is complaining of nocturia 6-7 times, and going every day hour.  He is on protein supplements for protein malnutrition.  Lab Results  Component Value Date   WBC 7.5 05/21/2014   HGB 10.8* 05/21/2014   HCT 33.8* 05/21/2014   MCV 90.4 05/21/2014   PLT 322 05/21/2014   Current Medications:  Current Outpatient Prescriptions on File Prior to Visit  Medication Sig Dispense Refill  . ALPRAZolam (XANAX) 0.5 MG tablet Take 1 tablet (0.5 mg total) by mouth 2 (two) times daily as needed for anxiety. 30 tablet 0  . Amino Acids-Protein Hydrolys (FEEDING SUPPLEMENT, PRO-STAT SUGAR FREE 64,) LIQD Take 30 mLs by mouth 3 (three) times daily with meals. 900 mL 1  . Ascorbic Acid (VITAMIN C) 1000 MG tablet Take 1,000 mg by mouth daily.    Marland Kitchen aspirin EC 81 MG tablet Take 81 mg by mouth daily.    . calcium-vitamin D (OSCAL WITH D) 500-200 MG-UNIT per tablet Take 1 tablet by mouth 2 (two) times daily. 60 tablet 1  . Cholecalciferol (VITAMIN D) 2000 UNITS CAPS Take 4,000 Units by mouth daily.     Marland Kitchen emollient (BIAFINE) cream Apply topically as needed.    Marland Kitchen glimepiride (AMARYL) 4 MG  tablet Take 0.5 tablets (2 mg total) by mouth 2 (two) times daily. 60 tablet 1  . HYDROcodone-acetaminophen (NORCO/VICODIN) 5-325 MG per tablet Take 1 tablet by mouth every 6 (six) hours as needed for moderate pain. 30 tablet 0  . levETIRAcetam (KEPPRA) 500 MG tablet Take 1 tablet (500 mg total) by mouth 2 (two) times daily. 60 tablet 1  . metFORMIN (GLUCOPHAGE-XR) 500 MG 24 hr tablet Take 1 tablet (500 mg total) by mouth 2 (two) times daily with breakfast and lunch. 60  tablet 1  . Multiple Vitamin (MULTIVITAMIN WITH MINERALS) TABS Take 1 tablet by mouth daily.    . pantoprazole (PROTONIX) 40 MG tablet Take 1 tablet (40 mg total) by mouth at bedtime. 30 tablet 1  . polyethylene glycol (MIRALAX / GLYCOLAX) packet Take 60 g by mouth 2 (two) times daily. 14 each 0  . pravastatin (PRAVACHOL) 40 MG tablet Take 1 tablet (40 mg total) by mouth at bedtime. 30 tablet 3  . prochlorperazine (COMPAZINE) 10 MG tablet Take 1 tablet (10 mg total) by mouth every 6 (six) hours as needed for nausea or vomiting. 30 tablet 0  . senna (SENOKOT) 8.6 MG TABS tablet Take 2 tablets (17.2 mg total) by mouth 2 (two) times daily. For constipation. Adjust as  needed 120 each 0  . simethicone (MYLICON) 80 MG chewable tablet Chew 1 tablet (80 mg total) by mouth 4 (four) times daily as needed for flatulence. 120 tablet 0   Current Facility-Administered Medications on File Prior to Visit  Medication Dose Route Frequency Provider Last Rate Last Dose  . topical emolient (BIAFINE) emulsion   Topical BID Lora Paula, MD       Medical History:  Past Medical History  Diagnosis Date  . Diabetes mellitus without complication   . Hypertension   . Hyperlipidemia   . GERD (gastroesophageal reflux disease)   . Vitamin D deficiency   . History of colon polyps   . Complication of anesthesia     Difficult to wake up; and severe nausea and vomiting  . PONV (postoperative nausea and vomiting)    Allergies:  Allergies  Allergen Reactions  . Atenolol Shortness Of Breath  . Lipitor [Atorvastatin]     Itch     Review of Systems:  Review of Systems  Constitutional: Positive for weight loss and malaise/fatigue. Negative for fever, chills and diaphoresis.  HENT: Negative.   Eyes: Negative.   Respiratory: Negative.   Cardiovascular: Negative.   Gastrointestinal: Positive for constipation. Negative for heartburn, nausea, vomiting, abdominal pain, diarrhea, blood in stool and melena.   Genitourinary: Positive for urgency and frequency. Negative for dysuria, hematuria and flank pain.  Musculoskeletal: Positive for back pain and joint pain (lef pain). Negative for myalgias, falls and neck pain.  Skin: Positive for rash (from radiation, healing per wife). Negative for itching.  Neurological: Positive for weakness. Negative for dizziness, tingling, tremors, sensory change, speech change, focal weakness, seizures and loss of consciousness.  Psychiatric/Behavioral: Positive for depression. Negative for suicidal ideas, hallucinations, memory loss and substance abuse. The patient is not nervous/anxious and does not have insomnia.     Family history- Review and unchanged Social history- Review and unchanged Physical Exam: BP 122/72 mmHg  Pulse 84  Temp(Src) 98.3 F (36.8 C)  Resp 16  Ht 6\' 4"  (1.93 m)  Wt 194 lb (87.998 kg)  BMI 23.62 kg/m2 Wt Readings from Last 3 Encounters:  05/29/14 194 lb (87.998 kg)  05/22/14 187 lb 4.8  oz (84.959 kg)  05/11/14 195 lb 1.6 oz (88.497 kg)   General Appearance: appears ill but in no apparent distress. Eyes: PERRLA, EOMs, conjunctiva no swelling or erythema Sinuses: No Frontal/maxillary tenderness ENT/Mouth: Ext aud canals clear, TMs without erythema, bulging. No erythema, swelling, or exudate on post pharynx.  Tonsils not swollen or erythematous. Hearing normal.  Neck: Supple, thyroid normal.  Respiratory: Respiratory effort normal, BS equal bilaterally without rales, rhonchi, wheezing or stridor.  Cardio: RRR with no MRGs.  Abdomen: Soft, + BS,   Non tender, no guarding, rebound, hernias, masses. Musculoskeletal: Full ROM, decreased strength, in wheel chair Skin: Warm, dry without ecchymosis. Healing erythema/scaling on head, forehead, and face Neuro: Cranial nerves intact. No cerebellar symptoms.  Psych: Awake and oriented X 3, normal affect, Insight and Judgment appropriate   Vicie Mutters, PA-C 9:43 AM [facility]

## 2014-05-29 NOTE — Patient Instructions (Addendum)
Will check urine to rule out infection. Complaining of frequency/nocturia- gave samples of myrebtriq will consult with oncologist before taking.   Please be careful with the  glimepiride (Amaryl). This medication forces your blood sugar down no matter what it is starting at.  Only take 1/2 of the medication if your sugar is above 150 and you can take a whole pill if your sugar is above 200 in the morning. If at any time you start to have low blood sugars in the morning or during the day please stop this medication. Please never take this medication if you are sick or can not eat. A low blood sugar is much more dangerous than a high blood sugar. Your brain needs two things, sugar and oxygen.   Can stop the pravastatin for now if you wish  Is off the dexamethasone with fatigue, may need cortisol level check and with anemia may benefit from small amount added back on, will discuss with ongologist.   High Protein Diet A high protein diet means that high protein foods are added to your diet. Getting more protein in the diet is important for a number of reasons. Protein helps the body to build tissue, muscle, and to repair damage. People who have had surgery, injuries such as broken bones, infections, and burns, or illnesses such as cancer, may need more protein in their diet.  SERVING SIZES Measuring foods and serving sizes helps to make sure you are getting the right amount of food. The list below tells how big or small some common serving sizes are.   1 oz.........4 stacked dice.  3 oz........Marland KitchenDeck of cards.  1 tsp.......Marland KitchenTip of little finger.  1 tbs......Marland KitchenMarland KitchenThumb.  2 tbs.......Marland KitchenGolf ball.   cup......Marland KitchenHalf of a fist.  1 cup.......Marland KitchenA fist. FOOD SOURCES OF PROTEIN Listed below are some food sources of protein and the amount of protein they contain. Your Registered Dietitian can calculate how many grams of protein you need for your medical condition. High protein foods can be added to the diet  at mealtime or as snacks. Be sure to have at least 1 protein-containing food at each meal and snack to ensure adequate intake.  Meats and Meat Substitutes / Protein (g)  3 oz poultry (chicken, [place]) / 26 g  3 oz tuna, canned in water / 26 g  3 oz fish (cod) / 21 g  3 oz red meat (beef, pork) / 21 g  4 oz tofu / 9 g  1 egg / 6 g   cup egg substitute / 5 g  1 cup dried beans / 15 g  1 cup soy milk / 4 g Dairy / Protein (g)  1 cup milk (skim, 1%, 2%, whole) / 8 g   cup evaporated milk / 9 g  1 cup buttermilk / 8 g  1 cup low-fat plain yogurt / 11 g  1 cup regular plain yogurt / 9 g   cup cottage cheese / 14 g  1 oz cheddar cheese / 7 g Nuts / Protein (g)  2 tbs peanut butter / 8 g  1 oz peanuts / 7 g  2 tbs cashews / 5 g  2 tbs almonds / 5 g Document Released: 05/25/2005 Document Revised: 08/17/2011 Document Reviewed: 02/25/2007 ExitCare Patient Information 2015 [place], [place]. This information is not intended to replace advice given to you by your health care provider. Make sure you discuss any questions you have with your health care provider.

## 2014-05-30 ENCOUNTER — Ambulatory Visit (HOSPITAL_BASED_OUTPATIENT_CLINIC_OR_DEPARTMENT_OTHER): Payer: Medicare Other | Admitting: Nurse Practitioner

## 2014-05-30 ENCOUNTER — Telehealth: Payer: Self-pay | Admitting: Oncology

## 2014-05-30 ENCOUNTER — Ambulatory Visit (HOSPITAL_BASED_OUTPATIENT_CLINIC_OR_DEPARTMENT_OTHER): Payer: Medicare Other | Admitting: Lab

## 2014-05-30 VITALS — BP 122/75 | HR 104 | Temp 98.2°F | Resp 18 | Ht 76.0 in | Wt 193.3 lb

## 2014-05-30 DIAGNOSIS — C61 Malignant neoplasm of prostate: Secondary | ICD-10-CM

## 2014-05-30 DIAGNOSIS — D708 Other neutropenia: Secondary | ICD-10-CM

## 2014-05-30 DIAGNOSIS — C7931 Secondary malignant neoplasm of brain: Secondary | ICD-10-CM

## 2014-05-30 LAB — URINALYSIS, ROUTINE W REFLEX MICROSCOPIC
BILIRUBIN URINE: NEGATIVE
GLUCOSE, UA: 250 mg/dL — AB
Hgb urine dipstick: NEGATIVE
KETONES UR: NEGATIVE mg/dL
Leukocytes, UA: NEGATIVE
NITRITE: NEGATIVE
PH: 5 (ref 5.0–8.0)
Protein, ur: NEGATIVE mg/dL
Specific Gravity, Urine: 1.02 (ref 1.005–1.030)
Urobilinogen, UA: 0.2 mg/dL (ref 0.0–1.0)

## 2014-05-30 LAB — CBC WITH DIFFERENTIAL/PLATELET
BASO%: 2.6 % — AB (ref 0.0–2.0)
Basophils Absolute: 0 10*3/uL (ref 0.0–0.1)
EOS%: 2.6 % (ref 0.0–7.0)
Eosinophils Absolute: 0 10*3/uL (ref 0.0–0.5)
HEMATOCRIT: 30.2 % — AB (ref 38.4–49.9)
HGB: 9.7 g/dL — ABNORMAL LOW (ref 13.0–17.1)
LYMPH%: 47.4 % (ref 14.0–49.0)
MCH: 29.1 pg (ref 27.2–33.4)
MCHC: 32.1 g/dL (ref 32.0–36.0)
MCV: 90.7 fL (ref 79.3–98.0)
MONO#: 0.1 10*3/uL (ref 0.1–0.9)
MONO%: 9.6 % (ref 0.0–14.0)
NEUT%: 37.8 % — ABNORMAL LOW (ref 39.0–75.0)
NEUTROS ABS: 0.4 10*3/uL — AB (ref 1.5–6.5)
Platelets: 190 10*3/uL (ref 140–400)
RBC: 3.33 10*6/uL — ABNORMAL LOW (ref 4.20–5.82)
RDW: 13.6 % (ref 11.0–14.6)
WBC: 1.1 10*3/uL — ABNORMAL LOW (ref 4.0–10.3)
lymph#: 0.5 10*3/uL — ABNORMAL LOW (ref 0.9–3.3)

## 2014-05-30 MED ORDER — CIPROFLOXACIN HCL 500 MG PO TABS: 500.0000 mg | ORAL_TABLET | Freq: Two times a day (BID) | ORAL | Status: DC

## 2014-05-30 NOTE — Telephone Encounter (Signed)
gv adn printed appt sched and avs for pt for DEC and Jan 2016....sed added tx.

## 2014-05-30 NOTE — Progress Notes (Addendum)
[place] OFFICE PROGRESS NOTE   Diagnosis:  Small cell carcinoma of the prostate  INTERVAL HISTORY:   Ivan Stark returns as scheduled. He completed cycle 1 Carboplatin/Etoposide beginning 05/22/2014 with Neulasta support. He denies nausea/vomiting. No mouth sores. No diarrhea. He has had some constipation. The constipation is relieved with a stool softener. He has significant pain with bowel movements. He intermittently notes a small amount of blood especially after a "hard" bowel movement. No fever or shaking chills. No shortness of breath. He has a good appetite. Energy level is poor. He reports his blood pressure has been in normal range since discontinuation of antihypertensive medications. Blood sugars run from 100-200.  Objective:  Vital signs in last 24 hours:  Blood pressure 122/75, pulse 104, temperature 98.2 F (36.8 C), temperature source Oral, resp. rate 18, height 6\' 4"  (1.93 m), weight 193 lb 4.8 oz (87.68 kg), SpO2 100 %.    HEENT: No thrush or ulcers.  Resp: Lungs clear bilaterally. Cardio: Distant heart sounds. Regular rate and rhythm. GI: Abdomen soft and nontender. No hepatomegaly. Erythema with a single small area of superficial skin breakdown at the sacrum. Fullness/firmness at the left medial buttock. Nontender. No fluctuance. Significant tenderness at the anal verge. No mass. Vascular: Trace edema at the lower left leg/ankle. Left leg brace. Neuro: Alert and oriented. Moves all extremities. Slight weakness of the left leg. Overall difficult to assess motor strength due to the leg brace. Skin: Radiation dermatitis scalp/forehead.   Lab Results:  Lab Results  Component Value Date   WBC 1.1* 05/30/2014   HGB 9.7* 05/30/2014   HCT 30.2* 05/30/2014   MCV 90.7 05/30/2014   PLT 190 05/30/2014   NEUTROABS 0.4* 05/30/2014    Imaging:  No results found.  Medications: I have reviewed the patient's current  medications.  Assessment/Plan: 1. 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, status post resection 04/06/2014  Staging CTs of the chest, abdomen, and pelvis 04/12/2014 confirmed a prostate mass  Prostate ultrasound 04/16/2014 confirmed a prostate mass with a biopsy consistent with small cell carcinoma  Extrinsic appearing cecal mass confirmed on colonoscopy 04/19/2014 with a biopsy revealing small cell carcinoma  Whole brain and prostate radiation 04/24/2014 through 05/16/2014  Cycle 1 carboplatin/etoposide beginning 05/22/2014 with Neulasta support 2. Left frontal meningioma, WHO grade 1, status post resection 04/06/2014 3. Left leg weakness secondary to #1 4. 1 cmbrain lesion at the inferior falx noted on the MRI 03/21/2014, not resected 5. Diabetes  6. Hypertension  7. Hyperlipidemia 8. Neutropenia secondary to chemotherapy 9. Rectal pain/tenderness. Question toxicity related to radiation.   Disposition: Ivan Stark appears stable. He has completed 1 cycle of carboplatin/etoposide. He is neutropenic on labs today. We reviewed neutropenic precautions. He and his wife understand to contact the office with  fever, shaking chills, other signs of infection. He will begin Cipro 500 mg twice daily and return for a follow-up CBC on 06/05/2014.  He has significant pain with bowel movements and tenderness at the anal verge upon examination today. This may be related to the recent radiation. He will continue the stool softener. He will contact the office if the pain worsens or he develops any new symptoms.  He will return for a follow-up visit and cycle 2 carboplatin/etoposide on 06/19/2013. He will contact the office in the interim as outlined above or with any other problems.  Patient is seen with Dr. Benay Spice. 25 minutes were spent face-to-face at today's visit with the  majority of that time involved in counseling/coordination of care.    Ned Card  ANP/GNP-BC   05/30/2014  10:48 AM   This was a shared visit with Ned Card. Ivan Stark has completed one cycle of etoposide/carboplatin. He has developed severe neutropenia. He will be placed on prophylactic ciprofloxacin and will contact us for a fever.  He had significant pain with rectal exam today. No mass or abscess was palpated upon examination 2 cm into the anal canal.. There may be superficial ulceration at the anterior anal canal. I suspect the pain is related to radiation change and the prostate mass. The rectal exam was limited by pain today.  [name], M.D.

## 2014-05-31 ENCOUNTER — Telehealth: Payer: Self-pay | Admitting: Nutrition

## 2014-05-31 LAB — URINE CULTURE: Colony Count: 6000

## 2014-05-31 NOTE — Telephone Encounter (Signed)
Contacted patient at provider request. Patient has requested information on where he can purchase Protostat. Contacted patient by telephone and informed him Protostat can be purchased at the [employer]. Address and phone number provided. Patient was appreciative of information.  **Disclaimer: This note was dictated with voice recognition software. Similar sounding words can inadvertently be transcribed and this note may contain transcription errors which may not have been corrected upon publication of note.**

## 2014-06-05 ENCOUNTER — Other Ambulatory Visit (HOSPITAL_BASED_OUTPATIENT_CLINIC_OR_DEPARTMENT_OTHER): Payer: Medicare Other

## 2014-06-05 ENCOUNTER — Ambulatory Visit (HOSPITAL_BASED_OUTPATIENT_CLINIC_OR_DEPARTMENT_OTHER): Payer: Medicare Other | Admitting: Nurse Practitioner

## 2014-06-05 ENCOUNTER — Encounter: Payer: Self-pay | Admitting: *Deleted

## 2014-06-05 ENCOUNTER — Telehealth: Payer: Self-pay | Admitting: *Deleted

## 2014-06-05 ENCOUNTER — Encounter: Payer: Self-pay | Admitting: Nurse Practitioner

## 2014-06-05 VITALS — BP 146/73 | HR 103 | Temp 98.2°F | Resp 18 | Ht 76.0 in | Wt 196.0 lb

## 2014-06-05 DIAGNOSIS — C7931 Secondary malignant neoplasm of brain: Secondary | ICD-10-CM

## 2014-06-05 DIAGNOSIS — C61 Malignant neoplasm of prostate: Secondary | ICD-10-CM

## 2014-06-05 DIAGNOSIS — R233 Spontaneous ecchymoses: Secondary | ICD-10-CM

## 2014-06-05 LAB — CBC WITH DIFFERENTIAL/PLATELET
BASO%: 0.3 % (ref 0.0–2.0)
Basophils Absolute: 0 10*3/uL (ref 0.0–0.1)
EOS%: 0.1 % (ref 0.0–7.0)
Eosinophils Absolute: 0 10*3/uL (ref 0.0–0.5)
HCT: 30.8 % — ABNORMAL LOW (ref 38.4–49.9)
HGB: 9.7 g/dL — ABNORMAL LOW (ref 13.0–17.1)
LYMPH%: 9.8 % — ABNORMAL LOW (ref 14.0–49.0)
MCH: 28.7 pg (ref 27.2–33.4)
MCHC: 31.6 g/dL — ABNORMAL LOW (ref 32.0–36.0)
MCV: 91 fL (ref 79.3–98.0)
MONO#: 0.6 10*3/uL (ref 0.1–0.9)
MONO%: 8.5 % (ref 0.0–14.0)
NEUT#: 6 10*3/uL (ref 1.5–6.5)
NEUT%: 81.3 % — ABNORMAL HIGH (ref 39.0–75.0)
Platelets: 199 10*3/uL (ref 140–400)
RBC: 3.39 10*6/uL — ABNORMAL LOW (ref 4.20–5.82)
RDW: 14.6 % (ref 11.0–14.6)
WBC: 7.4 10*3/uL (ref 4.0–10.3)
lymph#: 0.7 10*3/uL — ABNORMAL LOW (ref 0.9–3.3)

## 2014-06-05 NOTE — Progress Notes (Signed)
will   SYMPTOM MANAGEMENT CLINIC   HPI: Ivan Stark 68 y.o. male diagnosed with prostate cancer; with brain metastasis.  Patient is status post brain metastasis resection and prostate/whole brain radiation.  Currently undergoing carboplatin/etoposide chemotherapy regimen.   Patient called the cancer Center today requesting urgent care visit.  Patient states that he typically wears a left lower extremity brace due to his chronic left lower extremity weakness secondary to previously diagnosed brain metastasis.  Patient removed his leg brace over the weekend; and noted some petechial rash at the brace site.  He complains of occasional pruritus to the rash; but denies any other symptoms whatsoever.  He states he has been ambulating much better since removing the brace; is made the decision to keep the brace off for the time being.  He states that he has an appointment with his rehabilitation physician next week.  Also, patient was found to be neutropenic with last lab draw.  Patient denies any recent fevers or chills.  HPI  CURRENT THERAPY: Upcoming Treatment Dates - LUNG Carboplatin D1 / Etoposide D1-3 q28d Days with orders from any treatment category:  06/19/2014      SCHEDULING COMMUNICATION      ondansetron (ZOFRAN) IVPB 16 mg      Dexamethasone Sodium Phosphate (DECADRON) injection 20 mg      etoposide (VEPESID) 220 mg in sodium chloride 0.9 % 600 mL chemo infusion      CARBOplatin (PARAPLATIN) 570 mg in sodium chloride 0.9 % 250 mL chemo infusion      sodium chloride 0.9 % injection 10 mL      heparin lock flush 100 unit/mL      heparin lock flush 100 unit/mL      alteplase (CATHFLO ACTIVASE) injection 2 mg      sodium chloride 0.9 % injection 3 mL      Hot Pack 1 packet      0.9 %  sodium chloride infusion      TREATMENT CONDITIONS 06/20/2014      SCHEDULING COMMUNICATION      ondansetron (ZOFRAN) IVPB 8 mg      dexamethasone (DECADRON) injection 10 mg      etoposide (VEPESID)  220 mg in sodium chloride 0.9 % 600 mL chemo infusion      sodium chloride 0.9 % injection 10 mL      heparin lock flush 100 unit/mL      heparin lock flush 100 unit/mL      alteplase (CATHFLO ACTIVASE) injection 2 mg      sodium chloride 0.9 % injection 3 mL      Hot Pack 1 packet      0.9 %  sodium chloride infusion 06/21/2014      SCHEDULING COMMUNICATION      ondansetron (ZOFRAN) IVPB 8 mg      dexamethasone (DECADRON) injection 10 mg      etoposide (VEPESID) 220 mg in sodium chloride 0.9 % 600 mL chemo infusion      sodium chloride 0.9 % injection 10 mL      heparin lock flush 100 unit/mL      heparin lock flush 100 unit/mL      alteplase (CATHFLO ACTIVASE) injection 2 mg      sodium chloride 0.9 % injection 3 mL      Hot Pack 1 packet      0.9 %  sodium chloride infusion    ROS  Past Medical History  Diagnosis Date  . Diabetes  mellitus without complication   . Hypertension   . Hyperlipidemia   . GERD (gastroesophageal reflux disease)   . Vitamin D deficiency   . History of colon polyps   . Complication of anesthesia     Difficult to wake up; and severe nausea and vomiting  . PONV (postoperative nausea and vomiting)     Past Surgical History  Procedure Laterality Date  . Tonsillectomy    . Colonoscopy w/ polypectomy    . Esophagus surgery      Had esophagus stretched due to food getting trapped in his throat  . Craniotomy Bilateral 04/06/2014    Procedure: Bilateral Fronto-parietal craniotomy for resection of tumors with Curve;  Surgeon: Consuella Lose, MD;  Location: [facility];  Service: Neurosurgery;  Laterality: Bilateral;  Bilateral Fronto-parietal craniotomy for resection of tumors with Curve  . Colonoscopy N/A 04/19/2014    Procedure: COLONOSCOPY;  Surgeon: Winfield Cunas., MD;  Location: [facility] ENDOSCOPY;  Service: Endoscopy;  Laterality: N/A;    has Hypertension; Hyperlipidemia; GERD (gastroesophageal reflux disease); Vitamin D deficiency; History of  colon polyps; CKD stage 1 due to type 2 diabetes mellitus; Encounter for long-term (current) use of other medications; Poor compliance with F/U; Brain metastases; Left hemiparesis; Cerebral meningioma; Left flaccid hemiparesis; Small cell carcinoma of prostate; Vision disturbance; Hypotension; Immobility; Anorexia; Dehydration; Hypoalbuminemia; Desquamated skin; and Petechial rash on his problem list.     is allergic to atenolol and lipitor.    Medication List       This list is accurate as of: 06/05/14  6:52 PM.  Always use your most recent med list.               ALPRAZolam 0.5 MG tablet  Commonly known as:  XANAX  Take 1 tablet (0.5 mg total) by mouth 2 (two) times daily as needed for anxiety.     aspirin EC 81 MG tablet  Take 81 mg by mouth daily.     calcium-vitamin D 500-200 MG-UNIT per tablet  Commonly known as:  OSCAL WITH D  Take 1 tablet by mouth 2 (two) times daily.     ciprofloxacin 500 MG tablet  Commonly known as:  CIPRO  Take 1 tablet (500 mg total) by mouth 2 (two) times daily.     dexamethasone 4 MG tablet  Commonly known as:  DECADRON     emollient cream  Commonly known as:  BIAFINE  Apply topically as needed.     feeding supplement (PRO-STAT SUGAR FREE 64) Liqd  Take 30 mLs by mouth 3 (three) times daily with meals.     glimepiride 4 MG tablet  Commonly known as:  AMARYL  Take 0.5 tablets (2 mg total) by mouth 2 (two) times daily.     HYDROcodone-acetaminophen 5-325 MG per tablet  Commonly known as:  NORCO/VICODIN  Take 1 tablet by mouth every 6 (six) hours as needed for moderate pain.     levETIRAcetam 500 MG tablet  Commonly known as:  KEPPRA  Take 1 tablet (500 mg total) by mouth 2 (two) times daily.     metFORMIN 500 MG 24 hr tablet  Commonly known as:  GLUCOPHAGE-XR  Take 1 tablet (500 mg total) by mouth 2 (two) times daily with breakfast and lunch.     multivitamin with minerals Tabs tablet  Take 1 tablet by mouth daily.      pantoprazole 40 MG tablet  Commonly known as:  PROTONIX  Take 1 tablet (40 mg total)  by mouth at bedtime.     polyethylene glycol packet  Commonly known as:  MIRALAX / GLYCOLAX  Take 60 g by mouth 2 (two) times daily.     pravastatin 40 MG tablet  Commonly known as:  PRAVACHOL  Take 1 tablet (40 mg total) by mouth at bedtime.     prochlorperazine 10 MG tablet  Commonly known as:  COMPAZINE  Take 1 tablet (10 mg total) by mouth every 6 (six) hours as needed for nausea or vomiting.     senna 8.6 MG Tabs tablet  Commonly known as:  SENOKOT  Take 2 tablets (17.2 mg total) by mouth 2 (two) times daily. For constipation. Adjust as  needed     simethicone 80 MG chewable tablet  Commonly known as:  MYLICON  Chew 1 tablet (80 mg total) by mouth 4 (four) times daily as needed for flatulence.     vitamin C 1000 MG tablet  Take 1,000 mg by mouth daily.     Vitamin D 2000 UNITS Caps  Take 4,000 Units by mouth daily.         PHYSICAL EXAMINATION  Blood pressure 146/73, pulse 103, temperature 98.2 F (36.8 C), temperature source Oral, resp. rate 18, height 6\' 4"  (1.93 m), weight 196 lb (88.905 kg), SpO2 100 %.  Physical Exam  Constitutional: He is oriented to person, place, and time. Vital signs are normal. He appears malnourished. He appears unhealthy. He appears cachectic.  HENT:  Head: Normocephalic and atraumatic.  Eyes: EOM are normal. Pupils are equal, round, and reactive to light. Right eye exhibits no discharge. Left eye exhibits no discharge. No scleral icterus.  Neck: Normal range of motion. Neck supple. No JVD present.  Pulmonary/Chest: Effort normal. No respiratory distress.  Musculoskeletal: Normal range of motion. He exhibits edema. He exhibits no tenderness.  Trace edema to left lower extremity above previous brace site.  No specific calf tenderness on exam.  Neurological: He is alert and oriented to person, place, and time.  Patient has chronic left lower extremity  weakness secondary to previously diagnosed brain metastasis.  Patient does appear much stronger today on exam; and was able to lift himself out of a wheelchair and ambulate a short distance  Skin: Skin is warm and dry. Rash noted. No erythema. There is pallor.  Patient has linear petechial rash directly correlated to his splint site to his left lower extremity and the top of this ankle.  No evidence of infection to rash.  Psychiatric: Affect normal.  Nursing note and vitals reviewed.   LABORATORY DATA:. Appointment on 06/05/2014  Component Date Value Ref Range Status  . WBC 06/05/2014 7.4  4.0 - 10.3 10e3/uL Final  . NEUT# 06/05/2014 6.0  1.5 - 6.5 10e3/uL Final  . HGB 06/05/2014 9.7* 13.0 - 17.1 g/dL Final  . HCT 06/05/2014 30.8* 38.4 - 49.9 % Final  . Platelets 06/05/2014 199  140 - 400 10e3/uL Final  . MCV 06/05/2014 91.0  79.3 - 98.0 fL Final  . MCH 06/05/2014 28.7  27.2 - 33.4 pg Final  . MCHC 06/05/2014 31.6* 32.0 - 36.0 g/dL Final  . RBC 06/05/2014 3.39* 4.20 - 5.82 10e6/uL Final  . RDW 06/05/2014 14.6  11.0 - 14.6 % Final  . lymph# 06/05/2014 0.7* 0.9 - 3.3 10e3/uL Final  . MONO# 06/05/2014 0.6  0.1 - 0.9 10e3/uL Final  . Eosinophils Absolute 06/05/2014 0.0  0.0 - 0.5 10e3/uL Final  . Basophils Absolute 06/05/2014 0.0  0.0 -  0.1 10e3/uL Final  . NEUT% 06/05/2014 81.3* 39.0 - 75.0 % Final  . LYMPH% 06/05/2014 9.8* 14.0 - 49.0 % Final  . MONO% 06/05/2014 8.5  0.0 - 14.0 % Final  . EOS% 06/05/2014 0.1  0.0 - 7.0 % Final  . BASO% 06/05/2014 0.3  0.0 - 2.0 % Final     RADIOGRAPHIC STUDIES: No results found.  ASSESSMENT/PLAN:    Brain metastases Patient is status post brain metastasis resection on 04/06/2014.  Patient completed whole brain radiation on 05/22/2014.  Patient continues with some scalp and for head radiation dermatitis; but it does appear to be slowly healing.  Petechial rash Patient typically wears a left lower extremity/ankle brace for chronic left lower  extremity weakness.  He did note a linear petechial rash to the area directly under the brace this past weekend.  He states that it does occasionally itch; but no specific hypersensitivity reaction noted on exam.  There is no evidence of infection to rash as well.  Platelet count today was stable at 199.  Patient states he has been doing much better since removing the brace; and plan to continue ambulating without the use of the brace.  Advised patient that the petechial rash will eventually subside.  He was also encouraged to remain as active as possible; but to elevate his legs above the level of his heart when resting when he could.    Small cell carcinoma of prostate Patient received cycle 1 of his carboplatin/etoposide chemotherapy regimen on 05/22/2014.  He is scheduled for cycle 2 of the same regimen on 06/19/2014.  Patient completed prostate radiation on 05/16/2014.  Also, [place] has improved to 6.0; and patient is no longer neutropenic.  Patient stated understanding of all instructions; and was in agreement with this plan of care. The patient knows to call the clinic with any problems, questions or concerns.   Review/collaboration with Dr. Benay Spice regarding all aspects of patient's visit today.   Total time spent with patient was 25 minutes;  with greater than 80 percent of that time spent in face to face counseling regarding his symptoms, and coordination of care and follow up.  Disclaimer: This note was dictated with voice recognition software. Similar sounding words can inadvertently be transcribed and may not be corrected upon review.   Drue Second, NP 06/05/2014

## 2014-06-05 NOTE — Assessment & Plan Note (Signed)
Patient is status post brain metastasis resection on 04/06/2014.  Patient completed whole brain radiation on 05/22/2014.  Patient continues with some scalp and for head radiation dermatitis; but it does appear to be slowly healing.

## 2014-06-05 NOTE — Assessment & Plan Note (Signed)
Patient received cycle 1 of his carboplatin/etoposide chemotherapy regimen on 05/22/2014.  He is scheduled for cycle 2 of the same regimen on 06/19/2014.  Patient completed prostate radiation on 05/16/2014.  Also, [place] has improved to 6.0; and patient is no longer neutropenic.

## 2014-06-05 NOTE — Assessment & Plan Note (Signed)
Patient typically wears a left lower extremity/ankle brace for chronic left lower extremity weakness.  He did note a linear petechial rash to the area directly under the brace this past weekend.  He states that it does occasionally itch; but no specific hypersensitivity reaction noted on exam.  There is no evidence of infection to rash as well.  Platelet count today was stable at 199.  Patient states he has been doing much better since removing the brace; and plan to continue ambulating without the use of the brace.  Advised patient that the petechial rash will eventually subside.  He was also encouraged to remain as active as possible; but to elevate his legs above the level of his heart when resting when he could.

## 2014-06-05 NOTE — Telephone Encounter (Signed)
   Provider input needed: LEFT LEG RASH   Reason for call: LEFT LEG RASH FROM ANKLE TO CALF  INTEGUMENT OF LEFT LEG FROM ANKLE TO CALF THERE IS A RED TREE LIMB PATTERN RASH WITH SMALL RAISED DOTS. PT.'S SKIN IS DRY AND FLAKEY. NO ITCHING. PT. HAS NOT USE ANY OVER THE COUNTER MEDICATIONS FOR THIS RASH.    ALLERGIES:  is allergic to atenolol and lipitor.  Patient last received chemotherapy/ treatment on 05/22/14.    Patient was last seen in the office on 05/30/14.  Next appt is 06/19/14  Is patient having fevers greater than 100.5?  no   Is patient having uncontrolled pain, or new pain? no   Is patient having new back pain that changes with position (worsens or eases when laying down?)  no   Is patient able to eat and drink? yes    Is patient able to pass stool without difficulty?   yes     Is patient having uncontrolled nausea?  no    patient calls 06/05/2014 with complaint of  INTEGUMENT AS CHARTED ABOVE.   Summary Based on the above information advised patient to  WAIT TO SEE Ivan BACON,NP.   Ivan Stark M  06/05/2014, 11:41 AM   Background Info  Ivan Stark   DOB: 1945/10/24   MR#: [id number]   CSN#   [id number] 06/05/2014

## 2014-06-07 ENCOUNTER — Telehealth: Payer: Self-pay | Admitting: *Deleted

## 2014-06-07 NOTE — Telephone Encounter (Signed)
Called patient for follow-up per Selena Lesser. Patient states that he is using the cream that was prescribed for him and the rash is drying up and going away. States that it looks a whole lot better. Patient advised to call for any concerns. Patient verbalized understanding.

## 2014-06-11 ENCOUNTER — Ambulatory Visit
Admission: RE | Admit: 2014-06-11 | Discharge: 2014-06-11 | Disposition: A | Discharge status: Home / Self Care | Payer: Medicare Other | Source: Ambulatory Visit | Attending: Radiation Oncology | Admitting: Radiation Oncology

## 2014-06-11 ENCOUNTER — Encounter: Payer: Self-pay | Admitting: Radiation Oncology

## 2014-06-11 VITALS — BP 118/64 | HR 97 | Resp 16 | Wt 198.0 lb

## 2014-06-11 DIAGNOSIS — C7931 Secondary malignant neoplasm of brain: Secondary | ICD-10-CM

## 2014-06-11 NOTE — Progress Notes (Signed)
Riding in wheelchair today. Ambulates at home with aid of walker. Questioning if he can drive again short distances. No longer taking decadron. Reports diminished hearing in both ears. Wax build up bilaterally. Encouraged follow up with PCP or ENT. Denies headache, dizziness, nausea, vomiting, diplopia or ringing in the ears. Denies diarrhea. Denies dysuria or hematuria. Reports nocturia plus 5. Describes a strong steady urine stream. Scaly dry scalp noted. Encouraged to continue biafine twice per day. Denies pain. Questioning if Keppra refill is needed. No follow up with [facility] set.

## 2014-06-11 NOTE — Progress Notes (Addendum)
Radiation Oncology         [phone]   Name: Ivan Stark   Date: 06/11/2014   MRN: [id number]  DOB: [birth year]    Multidisciplinary Brain and Spine Oncology Clinic Follow-Up Visit Note  CC: Alesia Richards, MD  Unk Pinto, MD    ICD-9-CM ICD-10-CM   1. 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  The recent films were presented in our multidisciplinary conference with neuroradiology just prior to the clinic.  Riding in wheelchair today. Ambulates at home with aid of walker. Questioning if he can drive again short distances. No longer taking decadron. Reports diminished hearing in both ears. Wax build up bilaterally. Encouraged follow up with PCP or ENT. Denies headache, dizziness, nausea, vomiting, diplopia or ringing in the ears. Denies diarrhea. Denies dysuria or hematuria. Reports nocturia plus 5. Describes a strong steady urine stream. Scaly dry scalp noted. Encouraged to continue biafine twice per day. Denies pain. Questioning if Keppra refill is needed. No follow up with [facility] set.                               ALLERGIES:  is allergic to atenolol and lipitor.  Meds: Current Outpatient Prescriptions  Medication Sig Dispense Refill  . ALPRAZolam (XANAX) 0.5 MG tablet Take 1 tablet (0.5 mg total) by mouth 2 (two) times daily as needed for anxiety. 30 tablet 0  . Ascorbic Acid (VITAMIN C) 1000 MG tablet Take 1,000 mg by mouth daily.    Marland Kitchen aspirin EC 81 MG tablet Take 81 mg by mouth daily.    . calcium-vitamin D (OSCAL WITH D) 500-200 MG-UNIT per tablet Take 1 tablet by mouth 2 (two) times daily. 60 tablet 1  . Cholecalciferol  (VITAMIN D) 2000 UNITS CAPS Take 4,000 Units by mouth daily.     . ciprofloxacin (CIPRO) 500 MG tablet Take 1 tablet (500 mg total) by mouth 2 (two) times daily. 14 tablet 0  . emollient (BIAFINE) cream Apply topically as needed.    Marland Kitchen glimepiride (AMARYL) 4 MG tablet Take 0.5 tablets (2 mg total) by mouth 2 (two) times daily. 60 tablet 1  . metFORMIN (GLUCOPHAGE-XR) 500 MG 24 hr tablet Take 1 tablet (500 mg total) by mouth 2 (two) times daily with breakfast and lunch. 60 tablet 1  . Multiple Vitamin (MULTIVITAMIN WITH MINERALS) TABS Take 1 tablet by mouth daily.    Marland Kitchen senna (SENOKOT) 8.6 MG TABS tablet Take 2 tablets (17.2 mg total) by mouth 2 (two) times daily. For constipation. Adjust as  needed 120 each 0  . Amino Acids-Protein Hydrolys (FEEDING SUPPLEMENT, PRO-STAT SUGAR FREE 64,) LIQD Take 30 mLs by mouth 3 (three) times daily with meals. (Patient not taking: Reported on 05/30/2014) 900 mL 1  . dexamethasone (DECADRON) 4 MG tablet   1  . HYDROcodone-acetaminophen (NORCO/VICODIN) 5-325 MG per tablet Take 1 tablet by mouth every 6 (six) hours as needed for moderate pain. (Patient not taking: Reported on 06/11/2014) 30 tablet 0  . levETIRAcetam (KEPPRA) 500 MG tablet Take 1 tablet (500 mg total) by mouth 2 (two) times daily. (  Patient not taking: Reported on 06/11/2014) 60 tablet 1  . pantoprazole (PROTONIX) 40 MG tablet Take 1 tablet (40 mg total) by mouth at bedtime. (Patient not taking: Reported on 06/11/2014) 30 tablet 1  . polyethylene glycol (MIRALAX / GLYCOLAX) packet Take 60 g by mouth 2 (two) times daily. (Patient not taking: Reported on 05/30/2014) 14 each 0  . pravastatin (PRAVACHOL) 40 MG tablet Take 1 tablet (40 mg total) by mouth at bedtime. (Patient not taking: Reported on 06/11/2014) 30 tablet 3  . prochlorperazine (COMPAZINE) 10 MG tablet Take 1 tablet (10 mg total) by mouth every 6 (six) hours as needed for nausea or vomiting. (Patient not taking: Reported on 05/30/2014) 30 tablet 0  .  simethicone (MYLICON) 80 MG chewable tablet Chew 1 tablet (80 mg total) by mouth 4 (four) times daily as needed for flatulence. (Patient not taking: Reported on 06/11/2014) 120 tablet 0   No current facility-administered medications for this encounter.   Facility-Administered Medications Ordered in Other Encounters  Medication Dose Route Frequency Provider Last Rate Last Dose  . topical emolient (BIAFINE) emulsion   Topical BID Lora Paula, MD        Physical Findings: The patient is in no acute distress. Patient is alert and oriented.  weight is 198 lb (89.812 kg). His blood pressure is 118/64 and his pulse is 97. His respiration is 16. .  No significant changes.  Lab Findings: Lab Results  Component Value Date   WBC 7.4 06/05/2014   HGB 9.7* 06/05/2014   HCT 30.8* 06/05/2014   MCV 91.0 06/05/2014   PLT 199 06/05/2014   Impression:  The patient is recovering from the effects of radiation.  He has experienced palliation of his rectal obstructive symptoms from his enlarged prostate.  Plan:  Follow-up brain MRI in 2 months then follow-up. Today, I discontinued his Keppra.  _____________________________________  Sheral Apley Tammi Klippel, M.D.

## 2014-06-12 ENCOUNTER — Encounter: Payer: Medicare Other | Attending: Physical Medicine & Rehabilitation | Admitting: Physical Medicine & Rehabilitation

## 2014-06-12 ENCOUNTER — Encounter: Payer: Self-pay | Admitting: Physical Medicine & Rehabilitation

## 2014-06-12 VITALS — HR 100 | Resp 14

## 2014-06-12 DIAGNOSIS — C7931 Secondary malignant neoplasm of brain: Secondary | ICD-10-CM | POA: Diagnosis not present

## 2014-06-12 DIAGNOSIS — Z923 Personal history of irradiation: Secondary | ICD-10-CM | POA: Diagnosis not present

## 2014-06-12 DIAGNOSIS — Z9221 Personal history of antineoplastic chemotherapy: Secondary | ICD-10-CM | POA: Diagnosis not present

## 2014-06-12 DIAGNOSIS — C61 Malignant neoplasm of prostate: Secondary | ICD-10-CM | POA: Insufficient documentation

## 2014-06-12 DIAGNOSIS — G8190 Hemiplegia, unspecified affecting unspecified side: Secondary | ICD-10-CM

## 2014-06-12 DIAGNOSIS — M7989 Other specified soft tissue disorders: Secondary | ICD-10-CM | POA: Diagnosis not present

## 2014-06-12 DIAGNOSIS — G8194 Hemiplegia, unspecified affecting left nondominant side: Secondary | ICD-10-CM | POA: Diagnosis not present

## 2014-06-12 NOTE — Progress Notes (Signed)
Subjective:    Patient ID: Ivan Stark, male    DOB: April 05, 1946, 69 y.o.   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 He has been walking with his walker at home. He has walked occasionally without. He has completed HH therapies.   He is having some swelling still on the left leg. He has had a rash on the left leg starting a couple weeks ago. It may have come from the brace.   He has been getting out of the house, going by his office, going to the store ,etc  Pain Inventory Average Pain 2 Pain Right Now 0 My pain is intermittent and aching  In the last 24 hours, has pain interfered with the following? General activity 0 Relation with others 0 Enjoyment of life 0 What TIME of day is your pain at its worst? no pain Sleep (in general) Fair   Pain is worse with: no pain Pain improves with: no pain Relief from Meds: 0  Mobility use a walker ability to climb steps?  yes do you drive?  no  Function retired  Neuro/Psych weakness trouble walking  Prior Studies Any changes since last visit?  no  Physicians involved in your care Any changes since last visit?  no   Family History  Problem Relation Age of Onset  . Goiter Mother   . Hypertension Father   . Cancer Father     colon   History   Social History  . Marital Status: Married    Spouse Name: N/A    Number of Children: N/A  . Years of Education: N/A   Social History Main Topics  . Smoking status: Never Smoker   . Smokeless tobacco: None  . Alcohol Use: No  . Drug Use: No  . Sexual Activity: None   Other Topics Concern  . None   Social History Narrative   Past Surgical History  Procedure Laterality Date  . Tonsillectomy    . Colonoscopy w/ polypectomy    . Esophagus surgery      Had esophagus stretched due to food getting trapped in his throat  . Craniotomy Bilateral 04/06/2014    Procedure: Bilateral  Fronto-parietal craniotomy for resection of tumors with Curve;  Surgeon: Consuella Lose, MD;  Location: [facility];  Service: Neurosurgery;  Laterality: Bilateral;  Bilateral Fronto-parietal craniotomy for resection of tumors with Curve  . Colonoscopy N/A 04/19/2014    Procedure: COLONOSCOPY;  Surgeon: Winfield Cunas., MD;  Location: [facility];  Service: Endoscopy;  Laterality: N/A;   Past Medical History  Diagnosis Date  . Diabetes mellitus without complication   . Hypertension   . Hyperlipidemia   . GERD (gastroesophageal reflux disease)   . Vitamin D deficiency   . History of colon polyps   . Complication of anesthesia     Difficult to wake up; and severe nausea and vomiting  . PONV (postoperative nausea and vomiting)    Pulse 100  Resp 14  SpO2 99%  Opioid Risk Score:   Fall Risk Score: Low Fall Risk (0-5 points)  Review of Systems  HENT: Negative.   Eyes: Negative.   Respiratory: Negative.   Cardiovascular: Positive for leg swelling.       Left leg  Gastrointestinal: Negative.   Endocrine: Negative.   Genitourinary: Negative.   Musculoskeletal: Negative.   Skin: Negative.   Allergic/Immunologic: Negative.   Neurological:  Positive for weakness and numbness.       Trouble walking  Hematological: Negative.   Psychiatric/Behavioral: Negative.        Objective:   Physical Exam  Constitutional: He is oriented to person, place, and time. He has lost some weight  HENT: oral mucosa pink and moist Eyes: EOM are normal.  Neck: Normal range of motion. Neck supple. No thyromegaly present.  Cardiovascular: Normal rate and regular rhythm.  Respiratory: Effort normal and breath sounds normal. No respiratory distress.  GI: Soft. Bowel sounds are normal. He exhibits no distension.  Neurological: He is alert and oriented to person, place, and time.  Follows commands. Good insight and awareness. Memory intact. Language normal. no CN deficits.  Right upper  extremity 5/5 deltoid, biceps, triceps, grip, left upper extremity 4+ to 5/5 in the deltoid, bicep, tricep, grip. 4/5 right hip flexor knee extensor and ankle dorsiflexor plantar flexor. Left lower extremity 4/5 hip flexor knee extensor, 4/5 HF and 3+/5 toe ex/flex Sensation sl diminished still to LT left leg. Mood and affect are appropriate. Steppage gait pattern especially without the walker. Does surprisingly well with the walker. Does drag the foot still when into stance.     Skin: Skin is warm and dry.  Psych: mood remains pleasant  Assessment/Plan: 1. 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 i thinkg he can progress to a cane without AFO.  -may need new kick plate for shoe (quite worn) 2. Oncology:small cell prostate cancer -dr. Benay Spice following, outpt ctx -rad/onc-xrt completed  Follow up with me in 2 months.

## 2014-06-12 NOTE — Patient Instructions (Signed)
WALK WITH YOUR BRACE (OVER A COMPRESSION STOCKING) IF YOU WALK WITHOUT A WALKER  IF YOU ARE USING YOUR WALKER, THEN YOU MAY WALK WITHOUT YOUR BRACE

## 2014-06-14 ENCOUNTER — Ambulatory Visit (INDEPENDENT_AMBULATORY_CARE_PROVIDER_SITE_OTHER): Payer: Medicare Other | Admitting: Physician Assistant

## 2014-06-14 VITALS — BP 138/82 | HR 96 | Temp 98.2°F | Resp 16 | Ht 76.0 in | Wt 200.0 lb

## 2014-06-14 DIAGNOSIS — H6123 Impacted cerumen, bilateral: Secondary | ICD-10-CM

## 2014-06-14 DIAGNOSIS — R350 Frequency of micturition: Secondary | ICD-10-CM

## 2014-06-14 DIAGNOSIS — N181 Chronic kidney disease, stage 1: Secondary | ICD-10-CM

## 2014-06-14 DIAGNOSIS — C61 Malignant neoplasm of prostate: Secondary | ICD-10-CM

## 2014-06-14 DIAGNOSIS — E1122 Type 2 diabetes mellitus with diabetic chronic kidney disease: Secondary | ICD-10-CM

## 2014-06-14 DIAGNOSIS — M7989 Other specified soft tissue disorders: Secondary | ICD-10-CM

## 2014-06-14 NOTE — Progress Notes (Signed)
   Subjective:    Patient ID: Ivan Stark, male    DOB: Oct 16, 1945, 69 y.o.   MRN: [id number]  HPI 69 y.o. male with history of DM, HTN, chol, prostate cancer with mets presents with decreased hearing. Rash left lower leg better without the brace in his shoe but he has swelling in his left leg, denies redness/warmth/pain. His sugar has been elevated, he has started back the amaryl 1/2 BID. He has not started the myrebtriq samples given.   Blood pressure 138/82, pulse 96, temperature 98.2 F (36.8 C), resp. rate 16, height 6\' 4"  (1.93 m), weight 200 lb (90.719 kg).  Review of Systems  Constitutional: Negative for fever, chills, diaphoresis, activity change, appetite change, fatigue and unexpected weight change.  HENT: Positive for hearing loss. Negative for congestion, dental problem, drooling, ear discharge, ear pain, facial swelling, mouth sores, nosebleeds, postnasal drip, rhinorrhea, sinus pressure, sneezing, sore throat, tinnitus, trouble swallowing and voice change.   Eyes: Negative.   Respiratory: Negative.  Negative for shortness of breath.   Cardiovascular: Positive for leg swelling (without warmth, redness, pain). Negative for chest pain and palpitations.       Left leg  Gastrointestinal: Negative.   Endocrine: Negative.   Genitourinary: Positive for frequency. Negative for dysuria, urgency, hematuria, flank pain, decreased urine volume, discharge, penile swelling, scrotal swelling, enuresis, difficulty urinating, genital sores, penile pain and testicular pain.  Musculoskeletal: Negative.   Skin: Positive for rash. Negative for color change, pallor and wound.  Allergic/Immunologic: Negative.   Neurological: Positive for weakness and numbness.       Trouble walking  Hematological: Negative.   Psychiatric/Behavioral: Negative.        Objective:   Physical Exam General Appearance: appears ill but in no apparent distress. Eyes: PERRLA, EOMs, conjunctiva no swelling or  erythema Sinuses: No Frontal/maxillary tenderness ENT/Mouth: bilateral with cerumen impaction, after cleaning TMs without erythema, bulging. No erythema, swelling, or exudate on post pharynx.  Tonsils not swollen or erythematous. Hearing better after cleaning Neck: Supple, thyroid normal.  Respiratory: Respiratory effort normal, BS equal bilaterally without rales, rhonchi, wheezing or stridor.  Cardio: RRR with no MRGs. + swelling left leg, double the size of right, no tenderness/warmth Abdomen: Soft, + BS,   Non tender, no guarding, rebound, hernias, masses. Musculoskeletal: Full ROM, decreased strength, walking with walker now Skin: Warm, dry without ecchymosis. Healing erythema/scaling on head, forehead, and face Neuro: Cranial nerves intact. No cerebellar symptoms.  Psych: Awake and oriented X 3, normal affect, Insight and Judgment appropriate     Assessment & Plan:  Cerumen impaction- stop using Qtips, irrigation used in the office without complications, use OTC drops/oil at home to prevent reoccurence Diabetes- continue to monitor at home, can do glipizide 1 in AM and 1/2 at night if needed.  Urinary frequency- ? OAB/spasm- rule out infection, given myrebtriq samples, will ask oncology if he can use Left leg swelling- with current cancer diagnosis and immobility, we will get an [place] to rule out DVT.

## 2014-06-14 NOTE — Patient Instructions (Signed)
Ask your oncologist if it is okay to try the Myrebetriq samples, start out with 25 mg, can go up to the 50mg  OR can cut the 50mg  in half. This is suppose to help decrease going to the bathroom/help you sleep. If you are unable to urinate go to the ER.   Use a dropper to put olive oil or canola oil in the effected ear- 2-3 times a week. Let it soak for 20-30 min then you can take a shower or use a baby bulb with warm water to wash out the ear wax.  Do not use Qtips

## 2014-06-17 ENCOUNTER — Other Ambulatory Visit: Payer: Self-pay | Admitting: Oncology

## 2014-06-19 ENCOUNTER — Ambulatory Visit (HOSPITAL_BASED_OUTPATIENT_CLINIC_OR_DEPARTMENT_OTHER): Payer: Medicare Other

## 2014-06-19 ENCOUNTER — Telehealth: Payer: Self-pay | Admitting: Oncology

## 2014-06-19 ENCOUNTER — Other Ambulatory Visit: Payer: Self-pay | Admitting: Oncology

## 2014-06-19 ENCOUNTER — Ambulatory Visit (HOSPITAL_BASED_OUTPATIENT_CLINIC_OR_DEPARTMENT_OTHER): Payer: Medicare Other | Admitting: Nurse Practitioner

## 2014-06-19 ENCOUNTER — Other Ambulatory Visit (HOSPITAL_BASED_OUTPATIENT_CLINIC_OR_DEPARTMENT_OTHER): Payer: Medicare Other

## 2014-06-19 ENCOUNTER — Ambulatory Visit (HOSPITAL_COMMUNITY)
Admission: RE | Admit: 2014-06-19 | Discharge: 2014-06-19 | Disposition: A | Discharge status: Home / Self Care | Payer: Medicare Other | Source: Ambulatory Visit | Attending: Nurse Practitioner | Admitting: Nurse Practitioner

## 2014-06-19 VITALS — BP 136/75 | HR 91 | Temp 97.8°F | Resp 16 | Ht 76.0 in | Wt 195.1 lb

## 2014-06-19 DIAGNOSIS — C61 Malignant neoplasm of prostate: Secondary | ICD-10-CM

## 2014-06-19 DIAGNOSIS — C785 Secondary malignant neoplasm of large intestine and rectum: Secondary | ICD-10-CM

## 2014-06-19 DIAGNOSIS — C7931 Secondary malignant neoplasm of brain: Secondary | ICD-10-CM

## 2014-06-19 DIAGNOSIS — Z5111 Encounter for antineoplastic chemotherapy: Secondary | ICD-10-CM

## 2014-06-19 DIAGNOSIS — R609 Edema, unspecified: Secondary | ICD-10-CM | POA: Diagnosis not present

## 2014-06-19 LAB — COMPREHENSIVE METABOLIC PANEL (CC13)
ALBUMIN: 2.9 g/dL — AB (ref 3.5–5.0)
ALK PHOS: 70 U/L (ref 40–150)
ALT: 23 U/L (ref 0–55)
AST: 25 U/L (ref 5–34)
Anion Gap: 7 mEq/L (ref 3–11)
BUN: 9.5 mg/dL (ref 7.0–26.0)
CHLORIDE: 108 meq/L (ref 98–109)
CO2: 26 meq/L (ref 22–29)
Calcium: 8.6 mg/dL (ref 8.4–10.4)
Creatinine: 0.9 mg/dL (ref 0.7–1.3)
EGFR: >90 mL/min/{1.73_m2} (ref >90)
Glucose: 201 mg/dl — ABNORMAL HIGH (ref 70–140)
Potassium: 4 mEq/L (ref 3.5–5.1)
SODIUM: 140 meq/L (ref 136–145)
Total Bilirubin: 0.58 mg/dL (ref 0.20–1.20)
Total Protein: 6 g/dL — ABNORMAL LOW (ref 6.4–8.3)

## 2014-06-19 LAB — CBC WITH DIFFERENTIAL/PLATELET
BASO%: 2.7 % — ABNORMAL HIGH (ref 0.0–2.0)
Basophils Absolute: 0.2 10*3/uL — ABNORMAL HIGH (ref 0.0–0.1)
EOS%: 0.5 % (ref 0.0–7.0)
Eosinophils Absolute: 0 10*3/uL (ref 0.0–0.5)
HCT: 35.3 % — ABNORMAL LOW (ref 38.4–49.9)
HGB: 10.9 g/dL — ABNORMAL LOW (ref 13.0–17.1)
LYMPH#: 1 10*3/uL (ref 0.9–3.3)
LYMPH%: 17.4 % (ref 14.0–49.0)
MCH: 27.8 pg (ref 27.2–33.4)
MCHC: 30.9 g/dL — AB (ref 32.0–36.0)
MCV: 90.1 fL (ref 79.3–98.0)
MONO#: 0.7 10*3/uL (ref 0.1–0.9)
MONO%: 12.2 % (ref 0.0–14.0)
NEUT#: 4 10*3/uL (ref 1.5–6.5)
NEUT%: 67.2 % (ref 39.0–75.0)
Platelets: 709 10*3/uL — ABNORMAL HIGH (ref 140–400)
RBC: 3.92 10*6/uL — AB (ref 4.20–5.82)
RDW: 15.2 % — AB (ref 11.0–14.6)
WBC: 5.9 10*3/uL (ref 4.0–10.3)

## 2014-06-19 MED ORDER — ONDANSETRON 16 MG/50ML IVPB (CHCC)
16.0000 mg | Freq: Once | INTRAVENOUS | Status: AC
  Administered 2014-06-19: 16 mg via INTRAVENOUS

## 2014-06-19 MED ORDER — HEPARIN SOD (PORK) LOCK FLUSH 100 UNIT/ML IV SOLN
500.0000 [IU] | Freq: Once | INTRAVENOUS | Status: DC | PRN
  Filled 2014-06-19: qty 5

## 2014-06-19 MED ORDER — SODIUM CHLORIDE 0.9 % IV SOLN
568.0000 mg | Freq: Once | INTRAVENOUS | Status: AC
  Administered 2014-06-19: 570 mg via INTRAVENOUS
  Filled 2014-06-19: qty 57

## 2014-06-19 MED ORDER — SODIUM CHLORIDE 0.9 % IV SOLN
Freq: Once | INTRAVENOUS | Status: AC
  Administered 2014-06-19: 12:00:00 via INTRAVENOUS

## 2014-06-19 MED ORDER — SODIUM CHLORIDE 0.9 % IV SOLN
100.0000 mg/m2 | Freq: Once | INTRAVENOUS | Status: AC
  Administered 2014-06-19: 220 mg via INTRAVENOUS
  Filled 2014-06-19: qty 11

## 2014-06-19 MED ORDER — DEXAMETHASONE SODIUM PHOSPHATE 20 MG/5ML IJ SOLN
20.0000 mg | Freq: Once | INTRAMUSCULAR | Status: AC
  Administered 2014-06-19: 20 mg via INTRAVENOUS

## 2014-06-19 MED ORDER — DEXAMETHASONE SODIUM PHOSPHATE 20 MG/5ML IJ SOLN
INTRAMUSCULAR | Status: AC
  Filled 2014-06-19: qty 5

## 2014-06-19 MED ORDER — SODIUM CHLORIDE 0.9 % IJ SOLN
10.0000 mL | INTRAMUSCULAR | Status: DC | PRN
  Filled 2014-06-19: qty 10

## 2014-06-19 MED ORDER — ONDANSETRON 16 MG/50ML IVPB (CHCC)
INTRAVENOUS | Status: AC
  Filled 2014-06-19: qty 16

## 2014-06-19 NOTE — Patient Instructions (Signed)
[facility] Discharge Instructions for Patients Receiving Chemotherapy  Today you received the following chemotherapy agents :  VP-16 & carboplatin  To help prevent nausea and vomiting after your treatment, we encourage you to take your nausea medication   If you develop nausea and vomiting that is not controlled by your nausea medication, call the clinic.   BELOW ARE SYMPTOMS THAT SHOULD BE REPORTED IMMEDIATELY:  *FEVER GREATER THAN 100.5 F  *CHILLS WITH OR WITHOUT FEVER  NAUSEA AND VOMITING THAT IS NOT CONTROLLED WITH YOUR NAUSEA MEDICATION  *UNUSUAL SHORTNESS OF BREATH  *UNUSUAL BRUISING OR BLEEDING  TENDERNESS IN MOUTH AND THROAT WITH OR WITHOUT PRESENCE OF ULCERS  *URINARY PROBLEMS  *BOWEL PROBLEMS  UNUSUAL RASH Items with * indicate a potential emergency and should be followed up as soon as possible.  Feel free to call the clinic you have any questions or concerns. The clinic phone number is [phone].

## 2014-06-19 NOTE — Progress Notes (Signed)
Left lower extremity venous duplex completed.  Left:  No evidence of DVT, superficial thrombosis, or Baker's cyst.  Right:  Negative for DVT in the common femoral vein.  

## 2014-06-19 NOTE — Progress Notes (Addendum)
  [facility] OFFICE PROGRESS NOTE   Diagnosis:  Small cell carcinoma of the prostate  INTERVAL HISTORY:   Ivan Stark returns as scheduled. He completed cycle 1 carboplatin/etoposide beginning 05/22/2014 with Neulasta support. He is seen today prior to proceeding with cycle 2. Pain at the rectum is significantly better. He denies any bleeding. No significant nausea/vomiting. Bowels moving regularly. He notes improvement in strength. Activity level has improved. He is ambulating with a walker. He reports a good appetite. He is eating small frequent meals.  Objective:  Vital signs in last 24 hours:  Blood pressure 136/75, pulse 91, temperature 97.8 F (36.6 C), temperature source Oral, resp. rate 16, height 6\' 4"  (1.93 m), weight 195 lb 1.6 oz (88.497 kg), SpO2 100 %.    HEENT: No thrush or ulcers. Resp: Lungs clear bilaterally. Cardio: Regular rate and rhythm. Distant heart sounds. GI: Abdomen soft and nontender. No hepatomegaly. Vascular: Pitting edema right leg below the knee. Calves soft and nontender. Neuro: Alert and oriented. Moves all extremities. Skin: Radiation dermatitis at the scalp/forehead.    Lab Results:  Lab Results  Component Value Date   WBC 5.9 06/19/2014   HGB 10.9* 06/19/2014   HCT 35.3* 06/19/2014   MCV 90.1 06/19/2014   PLT 709* 06/19/2014   NEUTROABS 4.0 06/19/2014    Imaging:  No results found.  Medications: I have reviewed the patient's current medications.  Assessment/Plan: 1. 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, status post resection 04/06/2014  Staging CTs of the chest, abdomen, and pelvis 04/12/2014 confirmed a prostate mass  Prostate ultrasound 04/16/2014 confirmed a prostate mass with a biopsy consistent with small cell carcinoma  Extrinsic appearing cecal mass confirmed on colonoscopy 04/19/2014 with a biopsy revealing small cell carcinoma  Whole brain and prostate radiation 04/24/2014  through 05/16/2014  Cycle 1 carboplatin/etoposide beginning 05/22/2014 with Neulasta support 2. Left frontal meningioma, WHO grade 1, status post resection 04/06/2014 3. Left leg weakness secondary to #1 4. 1 cmbrain lesion at the inferior falx noted on the MRI 03/21/2014, not resected 5. Diabetes  6. Hypertension  7. Hyperlipidemia 8. Neutropenia following cycle 1 carboplatin/etoposide. 9. Rectal pain/tenderness. Question toxicity related to radiation. Improved.   Disposition: Ivan Stark appears improved. Plan to proceed with cycle 2 carboplatin/etoposide today as scheduled. He will again receive Neulasta support.  He has asymmetric edema of the left leg. We are referring him for a venous Doppler to rule out DVT.  He will return for a nadir CBC on 06/29/2014. We will see him in follow-up prior to proceeding with cycle 3 carboplatin/etoposide on 07/17/2014. Restaging CT evaluation is planned after cycle 3.  Patient seen with Dr. Benay Spice. 25 minutes were spent face-to-face at today's visit with the majority of that time involved in counseling/coordination of care.  Ned Card ANP/GNP-BC   06/19/2014  10:49 AM  This was a shared visit with Ned Card. Ivan Stark was interviewed and examined.  The plan is to proceed with cycle 2 chemotherapy today. His left-sided weakness appears much improved.  [name], M.D.

## 2014-06-19 NOTE — Telephone Encounter (Signed)
gv and printed appt sched and avs for pt for Jan and Feb.....sed added tx. °

## 2014-06-20 ENCOUNTER — Ambulatory Visit (HOSPITAL_BASED_OUTPATIENT_CLINIC_OR_DEPARTMENT_OTHER): Payer: Medicare Other

## 2014-06-20 DIAGNOSIS — C61 Malignant neoplasm of prostate: Secondary | ICD-10-CM

## 2014-06-20 DIAGNOSIS — Z5111 Encounter for antineoplastic chemotherapy: Secondary | ICD-10-CM

## 2014-06-20 DIAGNOSIS — C7931 Secondary malignant neoplasm of brain: Secondary | ICD-10-CM

## 2014-06-20 DIAGNOSIS — C785 Secondary malignant neoplasm of large intestine and rectum: Secondary | ICD-10-CM

## 2014-06-20 MED ORDER — SODIUM CHLORIDE 0.9 % IV SOLN
Freq: Once | INTRAVENOUS | Status: AC
  Administered 2014-06-20: 11:00:00 via INTRAVENOUS

## 2014-06-20 MED ORDER — DEXAMETHASONE SODIUM PHOSPHATE 10 MG/ML IJ SOLN
INTRAMUSCULAR | Status: AC
  Filled 2014-06-20: qty 1

## 2014-06-20 MED ORDER — ONDANSETRON 8 MG/50ML IVPB (CHCC)
8.0000 mg | Freq: Once | INTRAVENOUS | Status: AC
  Administered 2014-06-20: 8 mg via INTRAVENOUS

## 2014-06-20 MED ORDER — ONDANSETRON 8 MG/NS 50 ML IVPB
INTRAVENOUS | Status: AC
Start: 2014-06-20 — End: 2014-06-20
  Filled 2014-06-20: qty 8

## 2014-06-20 MED ORDER — HEPARIN SOD (PORK) LOCK FLUSH 100 UNIT/ML IV SOLN
500.0000 [IU] | Freq: Once | INTRAVENOUS | Status: DC | PRN
  Filled 2014-06-20: qty 5

## 2014-06-20 MED ORDER — SODIUM CHLORIDE 0.9 % IJ SOLN
10.0000 mL | INTRAMUSCULAR | Status: DC | PRN
  Filled 2014-06-20: qty 10

## 2014-06-20 MED ORDER — DEXAMETHASONE SODIUM PHOSPHATE 10 MG/ML IJ SOLN
10.0000 mg | Freq: Once | INTRAMUSCULAR | Status: AC
  Administered 2014-06-20: 10 mg via INTRAVENOUS

## 2014-06-20 MED ORDER — SODIUM CHLORIDE 0.9 % IV SOLN
100.0000 mg/m2 | Freq: Once | INTRAVENOUS | Status: AC
  Administered 2014-06-20: 220 mg via INTRAVENOUS
  Filled 2014-06-20: qty 11

## 2014-06-20 NOTE — Patient Instructions (Signed)
[facility] Discharge Instructions for Patients Receiving Chemotherapy  Today you received the following chemotherapy agents:  Etoposide  To help prevent nausea and vomiting after your treatment, we encourage you to take your nausea medication as ordered per MD.   If you develop nausea and vomiting that is not controlled by your nausea medication, call the clinic.   BELOW ARE SYMPTOMS THAT SHOULD BE REPORTED IMMEDIATELY:  *FEVER GREATER THAN 100.5 F  *CHILLS WITH OR WITHOUT FEVER  NAUSEA AND VOMITING THAT IS NOT CONTROLLED WITH YOUR NAUSEA MEDICATION  *UNUSUAL SHORTNESS OF BREATH  *UNUSUAL BRUISING OR BLEEDING  TENDERNESS IN MOUTH AND THROAT WITH OR WITHOUT PRESENCE OF ULCERS  *URINARY PROBLEMS  *BOWEL PROBLEMS  UNUSUAL RASH Items with * indicate a potential emergency and should be followed up as soon as possible.  Feel free to call the clinic you have any questions or concerns. The clinic phone number is [phone].    

## 2014-06-21 ENCOUNTER — Ambulatory Visit (HOSPITAL_BASED_OUTPATIENT_CLINIC_OR_DEPARTMENT_OTHER): Payer: Medicare Other

## 2014-06-21 DIAGNOSIS — C61 Malignant neoplasm of prostate: Secondary | ICD-10-CM

## 2014-06-21 DIAGNOSIS — Z5111 Encounter for antineoplastic chemotherapy: Secondary | ICD-10-CM

## 2014-06-21 DIAGNOSIS — C785 Secondary malignant neoplasm of large intestine and rectum: Secondary | ICD-10-CM

## 2014-06-21 DIAGNOSIS — C7931 Secondary malignant neoplasm of brain: Secondary | ICD-10-CM

## 2014-06-21 MED ORDER — ONDANSETRON 8 MG/50ML IVPB (CHCC)
8.0000 mg | Freq: Once | INTRAVENOUS | Status: AC
  Administered 2014-06-21: 8 mg via INTRAVENOUS

## 2014-06-21 MED ORDER — ONDANSETRON 8 MG/NS 50 ML IVPB
INTRAVENOUS | Status: AC
  Filled 2014-06-21: qty 8

## 2014-06-21 MED ORDER — DEXAMETHASONE SODIUM PHOSPHATE 10 MG/ML IJ SOLN
10.0000 mg | Freq: Once | INTRAMUSCULAR | Status: AC
  Administered 2014-06-21: 10 mg via INTRAVENOUS

## 2014-06-21 MED ORDER — SODIUM CHLORIDE 0.9 % IV SOLN
Freq: Once | INTRAVENOUS | Status: AC
  Administered 2014-06-21: 10:00:00 via INTRAVENOUS

## 2014-06-21 MED ORDER — DEXAMETHASONE SODIUM PHOSPHATE 10 MG/ML IJ SOLN
INTRAMUSCULAR | Status: AC
  Filled 2014-06-21: qty 1

## 2014-06-21 MED ORDER — SODIUM CHLORIDE 0.9 % IV SOLN
100.0000 mg/m2 | Freq: Once | INTRAVENOUS | Status: AC
  Administered 2014-06-21: 220 mg via INTRAVENOUS
  Filled 2014-06-21: qty 11

## 2014-06-21 NOTE — Patient Instructions (Signed)
[facility] Discharge Instructions for Patients Receiving Chemotherapy  Today you received the following chemotherapy agents Etoposide.  To help prevent nausea and vomiting after your treatment, we encourage you to take your nausea medication.   If you develop nausea and vomiting that is not controlled by your nausea medication, call the clinic.   BELOW ARE SYMPTOMS THAT SHOULD BE REPORTED IMMEDIATELY:  *FEVER GREATER THAN 100.5 F  *CHILLS WITH OR WITHOUT FEVER  NAUSEA AND VOMITING THAT IS NOT CONTROLLED WITH YOUR NAUSEA MEDICATION  *UNUSUAL SHORTNESS OF BREATH  *UNUSUAL BRUISING OR BLEEDING  TENDERNESS IN MOUTH AND THROAT WITH OR WITHOUT PRESENCE OF ULCERS  *URINARY PROBLEMS  *BOWEL PROBLEMS  UNUSUAL RASH Items with * indicate a potential emergency and should be followed up as soon as possible.  Feel free to call the clinic you have any questions or concerns. The clinic phone number is [phone].    

## 2014-06-22 ENCOUNTER — Ambulatory Visit (HOSPITAL_BASED_OUTPATIENT_CLINIC_OR_DEPARTMENT_OTHER): Payer: Medicare Other

## 2014-06-22 DIAGNOSIS — Z5189 Encounter for other specified aftercare: Secondary | ICD-10-CM

## 2014-06-22 DIAGNOSIS — C61 Malignant neoplasm of prostate: Secondary | ICD-10-CM

## 2014-06-22 MED ORDER — PEGFILGRASTIM INJECTION 6 MG/0.6ML ~~LOC~~
6.0000 mg | PREFILLED_SYRINGE | Freq: Once | SUBCUTANEOUS | Status: AC
  Administered 2014-06-22: 6 mg via SUBCUTANEOUS
  Filled 2014-06-22: qty 0.6

## 2014-07-11 ENCOUNTER — Ambulatory Visit: Payer: Medicare Other | Attending: Physical Medicine & Rehabilitation

## 2014-07-11 DIAGNOSIS — E1122 Type 2 diabetes mellitus with diabetic chronic kidney disease: Secondary | ICD-10-CM | POA: Insufficient documentation

## 2014-07-11 DIAGNOSIS — C61 Malignant neoplasm of prostate: Secondary | ICD-10-CM | POA: Diagnosis present

## 2014-07-11 DIAGNOSIS — I129 Hypertensive chronic kidney disease with stage 1 through stage 4 chronic kidney disease, or unspecified chronic kidney disease: Secondary | ICD-10-CM | POA: Insufficient documentation

## 2014-07-11 DIAGNOSIS — K219 Gastro-esophageal reflux disease without esophagitis: Secondary | ICD-10-CM | POA: Diagnosis not present

## 2014-07-11 DIAGNOSIS — E785 Hyperlipidemia, unspecified: Secondary | ICD-10-CM | POA: Insufficient documentation

## 2014-07-11 DIAGNOSIS — C7931 Secondary malignant neoplasm of brain: Secondary | ICD-10-CM | POA: Insufficient documentation

## 2014-07-11 DIAGNOSIS — E559 Vitamin D deficiency, unspecified: Secondary | ICD-10-CM | POA: Insufficient documentation

## 2014-07-11 DIAGNOSIS — N181 Chronic kidney disease, stage 1: Secondary | ICD-10-CM | POA: Insufficient documentation

## 2014-07-11 DIAGNOSIS — R269 Unspecified abnormalities of gait and mobility: Secondary | ICD-10-CM | POA: Diagnosis not present

## 2014-07-11 DIAGNOSIS — G8194 Hemiplegia, unspecified affecting left nondominant side: Secondary | ICD-10-CM | POA: Diagnosis not present

## 2014-07-11 NOTE — Therapy (Signed)
[facility] [address] [place], [place], [address] Phone: [phone]   Fax:  [phone]  Physical Therapy Evaluation  Patient Details  Name: Ivan Stark MRN: [id number] Date of Birth: 1945-10-28 Referring Provider:  Meredith Staggers, MD  Encounter Date: 07/11/2014      PT End of Session - 07/11/14 2037    Visit Number 1   Number of Visits 17   Date for PT Re-Evaluation 09/09/14   Authorization Type G-code every 10th visit.   PT Start Time [phone]   PT Stop Time 0928   PT Time Calculation (min) 33 min   Equipment Utilized During Treatment Gait belt   Activity Tolerance Patient tolerated treatment well   Behavior During Therapy WFL for tasks assessed/performed      Past Medical History  Diagnosis Date  . Diabetes mellitus without complication   . Hypertension   . Hyperlipidemia   . GERD (gastroesophageal reflux disease)   . Vitamin D deficiency   . History of colon polyps   . Complication of anesthesia     Difficult to wake up; and severe nausea and vomiting  . PONV (postoperative nausea and vomiting)     Past Surgical History  Procedure Laterality Date  . Tonsillectomy    . Colonoscopy w/ polypectomy    . Esophagus surgery      Had esophagus stretched due to food getting trapped in his throat  . Craniotomy Bilateral 04/06/2014    Procedure: Bilateral Fronto-parietal craniotomy for resection of tumors with Curve;  Surgeon: Consuella Lose, MD;  Location: [facility];  Service: Neurosurgery;  Laterality: Bilateral;  Bilateral Fronto-parietal craniotomy for resection of tumors with Curve  . Colonoscopy N/A 04/19/2014    Procedure: COLONOSCOPY;  Surgeon: Winfield Cunas., MD;  Location: [facility] ENDOSCOPY;  Service: Endoscopy;  Laterality: N/A;    There were no vitals taken for this visit.  Visit Diagnosis:  Abnormality of gait - Plan: PT plan of care cert/re-cert      Subjective Assessment -  07/11/14 0905    Symptoms Difficulty walking and L-sided weakness   Pertinent History Active prostate cancer with brain metastasis   Patient Stated Goals Walk without RW   Currently in Pain? No/denies          [facility] PT Assessment - 07/11/14 4656    Assessment   Medical Diagnosis Prostate Cancer with Brain metastasis, L sided hemiparesis   Onset Date 06/08/13   Prior Therapy Per pt: he was in inpatient rehab for approx. 1 month after 04/07/15 surgery to resect meningioma tumors, he then received HHPT after d/c.   Precautions   Precautions Fall   Restrictions   Weight Bearing Restrictions No   Balance Screen   Has the patient fallen in the past 6 months Yes   How many times? 1   Has the patient had a decrease in activity level because of a fear of falling?  Yes   Is the patient reluctant to leave their home because of a fear of falling?  No   Home Environment   Living Enviornment Private residence   Living Arrangements Spouse/significant other  Curt Bears   Available Help at Discharge Family   Type of [place] to enter   Entrance Stairs-Number of Steps --  pt has ramp   Entrance Stairs-Rails Can reach both   [place] One level   [place] - 2 wheels;Other (comment);Shower seat;Wheelchair -  manual;Bedside commode  walking   Prior Function   Level of Independence Independent with basic ADLs;Independent with transfers;Independent with gait   Vocation Full time employment  pastor    Vocation Requirements Stand, walk, and speak loudly   Leisure Enjoys spending time with his grandchildren   Cognition   Overall Cognitive Status Within Functional Limits for tasks assessed   Sensation   Light Touch Appears Intact  pt reported N/T occasionally in B fingers, and occasional N/   Coordination   Gross Motor Movements are Fluid and Coordinated Yes   Fine Motor Movements are Fluid and Coordinated Yes   Posture/Postural Control   Posture/Postural  Control Postural limitations   Postural Limitations Forward head   AROM   Overall AROM  Deficits   Overall AROM Comments Pt lacking approx. 5 degrees of AROM knee ext. in B knees, but pt was able to obtain full B knee extension during PROM.   Strength   Overall Strength Deficits   Overall Strength Comments R LE grossly: 4/5. L hip flex: 3+/5, L knee ext/flex: 4/5, L ankle dorsiflexion: 4/5.   Transfers   Transfers Sit to Stand;Stand to Sit   Sit to Stand 5: Supervision;With upper extremity assist;From chair/3-in-1   Stand to Sit 5: Supervision;With upper extremity assist;To chair/3-in-1   Ambulation/Gait   Ambulation/Gait Yes   Ambulation/Gait Assistance 5: Supervision   Ambulation/Gait Assistance Details Pt ambulated over even terrain with no LOB noted.   Ambulation Distance (Feet) 150 Feet   Assistive device Rolling walker   Gait Pattern Step-through pattern;Decreased dorsiflexion - left;Left steppage;Right flexed knee in stance;Left flexed knee in stance;Decreased trunk rotation  occasional L steppage    Ambulation Surface Level;Indoor   Gait velocity --  2.37ft/sec. with RW and 2.40ft/sec. without RW   Balance   Balance Assessed Yes   Static Standing Balance   Static Standing - Balance Support No upper extremity supported   Static Standing - Level of Assistance 5: Stand by assistance;Other (comment);4: Min assist  min guard   Static Standing - Comment/# of Minutes Pt was able to maintain balance with feet apart eyes open and closed, and with feet together but was noted to experience increased postural sway during feet apart with eyes closed and feet together. Pt able to perform R single leg stance for 3 seconds before requiring min A to maintain balance, and L single leg stance for 1 second before requiring min A.   Standardized Balance Assessment   Standardized Balance Assessment Timed Up and Go Test   Timed Up and Go Test   TUG Normal TUG   Normal TUG (seconds) 12.62  no AD                             PT Short Term Goals - 07/11/14 2026    PT SHORT TERM GOAL #1   Title Pt will be independent in HEP to improve functional mobility. Target date: 08/08/14.   Status New   PT SHORT TERM GOAL #2   Title Asses BERG and write appropriate STG and LTG. Target date: 08/08/14.   Status New   PT SHORT TERM GOAL #3   Title Pt will ambulated 300', with LRAD, over even/uneven terrain with supervision and no LOB to improve functional mobillity. Target date: 08/08/14.   Status New   PT SHORT TERM GOAL #4   Title Pt will report zero falls in the last 4 weeks to improve  safety during functional mobility. Target date: 08/08/14.   Status New           PT Long Term Goals - 07/16/2014 09/11/30    PT LONG TERM GOAL #1   Title Pt will verbalize understanding of falls prevention strategies to decrease falls risk. Target date: 09/05/14.   Status New   PT LONG TERM GOAL #2   Title Pt will ambulate 600', with LRAD, over even/uneven terrain at MOD I level in order to improve functional mobility at home and at work. Target date: 09/05/14.   Status New   PT LONG TERM GOAL #3   Title Have pt complete FOTO and write appropriate goal. Target date: 09/05/14.   Status New   PT LONG TERM GOAL #4   Title Pt will perform sit<>stand x10 with no UE support to improve safety during functional mobility. Target date: 09/05/14.   Status New   PT LONG TERM GOAL #5   Title Pt will improve gait speed, with LRAD, to >/=2.47ft/sec. to ambulate safely in the community. Target date: 09/05/14.   Status New               Plan - 07/16/14 0903    Clinical Impression Statement Pt is a pleasant 69 y/o male presenting to OPPT neuro with difficulty walking and L-side weakness with history of active prostate cancer with metastasis to brain. Pt reported difficulty walking began approx. one year ago, shortly before he was diagnosed with brain tumors. Pt has experienced one fall in the last six months,  when he was not using his RW. Pt has a L ant. toe off AFO, which he has ceased wearing due to it caused a rash on L LE.  Pt arrived 10 minutes late.   Pt will benefit from skilled therapeutic intervention in order to improve on the following deficits Abnormal gait;Impaired flexibility;Decreased range of motion;Decreased endurance;Decreased knowledge of use of DME;Decreased balance;Decreased mobility;Decreased strength   Rehab Potential Good   PT Frequency 2x / week   PT Duration 8 weeks   PT Treatment/Interventions ADLs/Self Care Home Management;Gait training;Neuromuscular re-education;Biofeedback;Functional mobility training;Patient/family education;Therapeutic activities;Therapeutic exercise;Manual techniques;Balance training;DME Instruction   PT Next Visit Plan Perform BERG and initiate balance/strength HEP.   Consulted and Agree with Plan of Care Patient          G-Codes - 07/16/2014 2036-09-10    Functional Assessment Tool Used Gait speed: 2.75ft/sec with RW and 2.52ft/sec without RW; TUG without AD: 12.62sec.; R single leg stance: 3 sec. and L single leg stance: 1 sec.    Functional Limitation Mobility: Walking and moving around   Mobility: Walking and Moving Around Current Status [phone]) At least 20 percent but less than 40 percent impaired, limited or restricted   Mobility: Walking and Moving Around Goal Status [phone]) At least 1 percent but less than 20 percent impaired, limited or restricted       Problem List Patient Active Problem List   Diagnosis Date Noted  . Petechial rash 06/05/2014  . Hypotension 05/22/2014  . Immobility 05/22/2014  . Anorexia 05/22/2014  . Dehydration 05/22/2014  . Hypoalbuminemia 05/22/2014  . Desquamated skin 05/22/2014  . Small cell carcinoma of prostate   . Vision disturbance   . Cerebral meningioma   . Left flaccid hemiparesis   . Left hemiparesis 04/09/2014  . Brain metastases 04/06/2014  . Poor compliance with F/U 02/04/2014  . CKD stage 1 due  to type 2 diabetes mellitus 08/20/2013  . Encounter for long-term (  current) use of other medications 08/20/2013  . Hypertension   . Hyperlipidemia   . GERD (gastroesophageal reflux disease)   . Vitamin D deficiency   . History of colon polyps     Jessey Huyett L 07/11/2014, 8:46 PM  [facility] [address] [place], [place], [address] Phone: [phone]   Fax:  [phone]    Geoffry Paradise, PT,DPT 07/11/2014 8:46 PM Phone: [phone] Fax: [phone]

## 2014-07-15 ENCOUNTER — Other Ambulatory Visit: Payer: Self-pay | Admitting: Oncology

## 2014-07-16 ENCOUNTER — Ambulatory Visit: Payer: Medicare Other

## 2014-07-16 DIAGNOSIS — C61 Malignant neoplasm of prostate: Secondary | ICD-10-CM | POA: Diagnosis not present

## 2014-07-16 DIAGNOSIS — R269 Unspecified abnormalities of gait and mobility: Secondary | ICD-10-CM

## 2014-07-16 NOTE — Patient Instructions (Addendum)
Perform in a corner with a chair in front of you for safety:  Feet Together, Head Motion - Eyes Open   With eyes open, feet together, move head slowly: up and down and side to side for 30 seconds. Repeat __3__ times per session. Do __1__ sessions per day.  Copyright  VHI. All rights reserved.  Feet Together, Varied Arm Positions - Eyes Closed   Stand with feet together and arms at your side. Close eyes and visualize upright position. Hold _10-30___ seconds. Repeat __3__ times per session. Do __1__ sessions per day.  Copyright  VHI. All rights reserved.  Feet Partial Heel-Toe, Varied Arm Positions - Eyes Open   With eyes open, right foot partially in front of the other, arms at your side, look straight ahead at a stationary object. Hold __30__ seconds. Repeat with left leg in front. Repeat _3___ times per session per leg. Do __1__ sessions per day.  Copyright  VHI. All rights reserved.  Single Leg - Eyes Open   Holding support, lift right leg while maintaining balance over other leg. Progress to removing hands from support surface for longer periods of time. Repeat with other leg Hold__10-30__ seconds. Repeat _3___ times per session per leg. Do ___1_ sessions per day.  Copyright  VHI. All rights reserved.

## 2014-07-16 NOTE — Therapy (Signed)
[facility] [address] [place], [place], [address] Phone: [phone]   Fax:  [phone]  Physical Therapy Treatment  Patient Details  Name: Ivan Stark MRN: [id number] Date of Birth: Dec 20, 1945 Referring Provider:  Unk Pinto, MD  Encounter Date: 07/16/2014      PT End of Session - 07/16/14 1131    Visit Number 2   Number of Visits 17   Date for PT Re-Evaluation 09/09/14   Authorization Type G-code every 10th visit.   PT Start Time [phone]   PT Stop Time 1015   PT Time Calculation (min) 32 min   Equipment Utilized During Treatment Gait belt   Activity Tolerance Patient tolerated treatment well   Behavior During Therapy WFL for tasks assessed/performed      Past Medical History  Diagnosis Date  . Diabetes mellitus without complication   . Hypertension   . Hyperlipidemia   . GERD (gastroesophageal reflux disease)   . Vitamin D deficiency   . History of colon polyps   . Complication of anesthesia     Difficult to wake up; and severe nausea and vomiting  . PONV (postoperative nausea and vomiting)     Past Surgical History  Procedure Laterality Date  . Tonsillectomy    . Colonoscopy w/ polypectomy    . Esophagus surgery      Had esophagus stretched due to food getting trapped in his throat  . Craniotomy Bilateral 04/06/2014    Procedure: Bilateral Fronto-parietal craniotomy for resection of tumors with Curve;  Surgeon: Consuella Lose, MD;  Location: [facility];  Service: Neurosurgery;  Laterality: Bilateral;  Bilateral Fronto-parietal craniotomy for resection of tumors with Curve  . Colonoscopy N/A 04/19/2014    Procedure: COLONOSCOPY;  Surgeon: Winfield Cunas., MD;  Location[facility] END[facility]COPY;  Service: Endoscopy;  Laterality: N/A;    There were no vitals taken for this visit.  Visit Diagnosis:  Abnormality of gait      Subjective Assessment - 07/16/14 0946    Symptoms Pt arrived 12  minutes late. Pt denied falls or changes since last visit.    Pertinent History Active prostate cancer with brain metastasis   Patient Stated Goals Walk without RW   Currently in Pain? No/denies                    [facility] Adult PT Treatment/Exercise - 07/16/14 0947    Balance   Balance Assessed Yes   Static Standing Balance   Static Standing - Balance Support No upper extremity supported;Bilateral upper extremity supported  UE support during SLS.   Static Standing - Level of Assistance 5: Stand by assistance;Other (comment)  min guard   Static Standing - Comment/# of Minutes Balance activities performed in corner with chair in front of pt for safety; B LE's, 1-2 sets with 10-30 second holds: feet apart/together with and without head turns, feet apart/together with eyes open/closed, modified tandem, single leg stance (with UE support). VC's and demonstration for technique.   Standardized Balance Assessment   Standardized Balance Assessment Berg Balance Test   Berg Balance Test   Sit to Stand Able to stand  independently using hands   Standing Unsupported Able to stand safely 2 minutes   Sitting with Back Unsupported but Feet Supported on Floor or Stool Able to sit safely and securely 2 minutes   Stand to Sit Sits safely with minimal use of hands   Transfers Able to transfer safely, definite need  of hands   Standing Unsupported with Eyes Closed Able to stand 10 seconds with supervision   Standing Ubsupported with Feet Together Needs help to attain position but able to stand for 30 seconds with feet together   From Standing, Reach Forward with Outstretched Arm Can reach forward >12 cm safely (5")  7"   From Standing Position, Pick up Object from Floor Able to pick up shoe, needs supervision   From Standing Position, Turn to Look Behind Over each Shoulder Looks behind one side only/other side shows less weight shift   Turn 360 Degrees Needs close supervision or verbal cueing    Standing Unsupported, Alternately Place Feet on Step/Stool Able to complete >2 steps/needs minimal assist   Standing Unsupported, One Foot in Front Able to plae foot ahead of the other independently and hold 30 seconds   Standing on One Leg Tries to lift leg/unable to hold 3 seconds but remains standing independently   Total Score 37                PT Education - 07/16/14 1131    Education provided Yes   Education Details Balance HEP   Person(s) Educated Patient   Methods Explanation;Demonstration;Verbal cues;Handout   Comprehension Verbalized understanding;Returned demonstration          PT Short Term Goals - 07/16/14 1134    PT SHORT TERM GOAL #1   Title Pt will be independent in HEP to improve functional mobility. Target date: 08/08/14.   Status On-going   PT SHORT TERM GOAL #2   Title Asses BERG and write appropriate STG and LTG. Target date: 08/08/14.   Status Achieved   PT SHORT TERM GOAL #3   Title Pt will ambulated 300', with LRAD, over even/uneven terrain with supervision and no LOB to improve functional mobillity. Target date: 08/08/14.   Status On-going   PT SHORT TERM GOAL #4   Title Pt will report zero falls in the last 4 weeks to improve safety during functional mobility. Target date: 08/08/14.   Status On-going   PT SHORT TERM GOAL #5   Title Pt will improve BERG balance score to >/=41/56 to decrease falls risk. Target date: 08/08/14.   Status New           PT Long Term Goals - 07/16/14 1134    PT LONG TERM GOAL #1   Title Pt will verbalize understanding of falls prevention strategies to decrease falls risk. Target date: 09/05/14.   Status On-going   PT LONG TERM GOAL #2   Title Pt will ambulate 600', with LRAD, over even/uneven terrain at MOD I level in order to improve functional mobility at home and at work. Target date: 09/05/14.   Status On-going   PT LONG TERM GOAL #3   Title Have pt complete FOTO and write appropriate goal. Target date: 09/05/14.    Status New   PT LONG TERM GOAL #4   Title Pt will perform sit<>stand x10 with no UE support to improve safety during functional mobility. Target date: 09/05/14.   Status On-going   PT LONG TERM GOAL #5   Title Pt will improve gait speed, with LRAD, to >/=2.66ft/sec. to ambulate safely in the community. Target date: 09/05/14.   Status On-going   Additional Long Term Goals   Additional Long Term Goals Yes   PT LONG TERM GOAL #6   Title Pt will improve BERG balance score to >/=45/56 to decrease falls risk. Target date: 09/05/14.   Status  New               Plan - 07/16/14 1132    Clinical Impression Statement Pt scored a 37/56 on BERG balance scale, indicating he is at a significant risk for falls. Pt continues to experience impaired balance, and has difficulty with backwards ambulation (backing up to mat/chair) due to weakness and poor balance. Pt would continue to benefit from skilled PT to imrpove safety during functional mobility.   Pt will benefit from skilled therapeutic intervention in order to improve on the following deficits Abnormal gait;Impaired flexibility;Decreased range of motion;Decreased endurance;Decreased knowledge of use of DME;Decreased balance;Decreased mobility;Decreased strength   Rehab Potential Good   PT Frequency 2x / week   PT Duration 8 weeks   PT Treatment/Interventions ADLs/Self Care Home Management;Gait training;Neuromuscular re-education;Biofeedback;Functional mobility training;Patient/family education;Therapeutic activities;Therapeutic exercise;Manual techniques;Balance training;DME Instruction   PT Next Visit Plan Fill out FOTO and Initiate strengthening HEP.   Consulted and Agree with Plan of Care Patient        Problem List Patient Active Problem List   Diagnosis Date Noted  . Petechial rash 06/05/2014  . Hypotension 05/22/2014  . Immobility 05/22/2014  . Anorexia 05/22/2014  . Dehydration 05/22/2014  . Hypoalbuminemia 05/22/2014  .  Desquamated skin 05/22/2014  . Small cell carcinoma of prostate   . Vision disturbance   . Cerebral meningioma   . Left flaccid hemiparesis   . Left hemiparesis 04/09/2014  . Brain metastases 04/06/2014  . Poor compliance with F/U 02/04/2014  . CKD stage 1 due to type 2 diabetes mellitus 08/20/2013  . Encounter for long-term (current) use of other medications 08/20/2013  . Hypertension   . Hyperlipidemia   . GERD (gastroesophageal reflux disease)   . Vitamin D deficiency   . History of colon polyps     Brandun Pinn L 07/16/2014, 11:36 AM  [facility] [address] [place], [place], [address] Phone: [phone]   Fax:  [phone]     Geoffry Paradise, PT,DPT 07/16/2014 11:36 AM Phone: [phone] Fax: [phone]

## 2014-07-17 ENCOUNTER — Ambulatory Visit (HOSPITAL_BASED_OUTPATIENT_CLINIC_OR_DEPARTMENT_OTHER): Payer: Medicare Other

## 2014-07-17 ENCOUNTER — Ambulatory Visit (HOSPITAL_BASED_OUTPATIENT_CLINIC_OR_DEPARTMENT_OTHER): Payer: Medicare Other | Admitting: Oncology

## 2014-07-17 ENCOUNTER — Telehealth: Payer: Self-pay | Admitting: *Deleted

## 2014-07-17 ENCOUNTER — Other Ambulatory Visit (HOSPITAL_BASED_OUTPATIENT_CLINIC_OR_DEPARTMENT_OTHER): Payer: Medicare Other

## 2014-07-17 ENCOUNTER — Telehealth: Payer: Self-pay | Admitting: Oncology

## 2014-07-17 VITALS — BP 128/78 | HR 98 | Temp 98.0°F | Resp 18 | Ht 76.0 in | Wt 189.9 lb

## 2014-07-17 DIAGNOSIS — C7931 Secondary malignant neoplasm of brain: Secondary | ICD-10-CM

## 2014-07-17 DIAGNOSIS — C785 Secondary malignant neoplasm of large intestine and rectum: Secondary | ICD-10-CM

## 2014-07-17 DIAGNOSIS — C61 Malignant neoplasm of prostate: Secondary | ICD-10-CM

## 2014-07-17 DIAGNOSIS — C7949 Secondary malignant neoplasm of other parts of nervous system: Secondary | ICD-10-CM

## 2014-07-17 DIAGNOSIS — Z5111 Encounter for antineoplastic chemotherapy: Secondary | ICD-10-CM

## 2014-07-17 LAB — COMPREHENSIVE METABOLIC PANEL (CC13)
ALK PHOS: 68 U/L (ref 40–150)
ALT: 9 U/L (ref 0–55)
AST: 12 U/L (ref 5–34)
Albumin: 3 g/dL — ABNORMAL LOW (ref 3.5–5.0)
Anion Gap: 11 mEq/L (ref 3–11)
BILIRUBIN TOTAL: 0.54 mg/dL (ref 0.20–1.20)
BUN: 10.8 mg/dL (ref 7.0–26.0)
CHLORIDE: 105 meq/L (ref 98–109)
CO2: 21 meq/L — AB (ref 22–29)
Calcium: 9.6 mg/dL (ref 8.4–10.4)
Creatinine: 0.8 mg/dL (ref 0.7–1.3)
GLUCOSE: 188 mg/dL — AB (ref 70–140)
Potassium: 4.2 mEq/L (ref 3.5–5.1)
SODIUM: 138 meq/L (ref 136–145)
TOTAL PROTEIN: 5.9 g/dL — AB (ref 6.4–8.3)

## 2014-07-17 LAB — CBC WITH DIFFERENTIAL/PLATELET
BASO%: 0.9 % (ref 0.0–2.0)
Basophils Absolute: 0.1 10*3/uL (ref 0.0–0.1)
EOS%: 0.5 % (ref 0.0–7.0)
Eosinophils Absolute: 0 10*3/uL (ref 0.0–0.5)
HEMATOCRIT: 36.2 % — AB (ref 38.4–49.9)
HGB: 11.4 g/dL — ABNORMAL LOW (ref 13.0–17.1)
LYMPH%: 13.1 % — AB (ref 14.0–49.0)
MCH: 27.4 pg (ref 27.2–33.4)
MCHC: 31.5 g/dL — AB (ref 32.0–36.0)
MCV: 87 fL (ref 79.3–98.0)
MONO#: 0.8 10*3/uL (ref 0.1–0.9)
MONO%: 12.4 % (ref 0.0–14.0)
NEUT%: 73.1 % (ref 39.0–75.0)
NEUTROS ABS: 4.8 10*3/uL (ref 1.5–6.5)
PLATELETS: 347 10*3/uL (ref 140–400)
RBC: 4.16 10*6/uL — ABNORMAL LOW (ref 4.20–5.82)
RDW: 16 % — ABNORMAL HIGH (ref 11.0–14.6)
WBC: 6.6 10*3/uL (ref 4.0–10.3)
lymph#: 0.9 10*3/uL (ref 0.9–3.3)
nRBC: 0 % (ref 0–0)

## 2014-07-17 MED ORDER — SODIUM CHLORIDE 0.9 % IV SOLN
570.0000 mg | Freq: Once | INTRAVENOUS | Status: AC
  Administered 2014-07-17: 570 mg via INTRAVENOUS
  Filled 2014-07-17: qty 57

## 2014-07-17 MED ORDER — SODIUM CHLORIDE 0.9 % IV SOLN
100.0000 mg/m2 | Freq: Once | INTRAVENOUS | Status: AC
  Administered 2014-07-17: 220 mg via INTRAVENOUS
  Filled 2014-07-17: qty 11

## 2014-07-17 MED ORDER — SODIUM CHLORIDE 0.9 % IV SOLN
Freq: Once | INTRAVENOUS | Status: AC
  Administered 2014-07-17: 12:00:00 via INTRAVENOUS

## 2014-07-17 MED ORDER — DEXAMETHASONE SODIUM PHOSPHATE 20 MG/5ML IJ SOLN
INTRAMUSCULAR | Status: AC
  Filled 2014-07-17: qty 5

## 2014-07-17 MED ORDER — DEXAMETHASONE SODIUM PHOSPHATE 20 MG/5ML IJ SOLN
20.0000 mg | Freq: Once | INTRAMUSCULAR | Status: AC
  Administered 2014-07-17: 20 mg via INTRAVENOUS

## 2014-07-17 MED ORDER — ONDANSETRON 16 MG/50ML IVPB (CHCC)
INTRAVENOUS | Status: AC
  Filled 2014-07-17: qty 16

## 2014-07-17 MED ORDER — ONDANSETRON 16 MG/50ML IVPB (CHCC)
16.0000 mg | Freq: Once | INTRAVENOUS | Status: AC
  Administered 2014-07-17: 16 mg via INTRAVENOUS

## 2014-07-17 NOTE — Progress Notes (Signed)
  [facility] OFFICE PROGRESS NOTE   Diagnosis: Small cell carcinoma of the prostate  INTERVAL HISTORY:   Ivan Stark returns as scheduled. He completed cycle 2 chemotherapy beginning 06/19/2014. No nausea or diarrhea following chemotherapy. He had left leg pain after the Neulasta that improved with ibuprofen. He continues to gain strength in the left side. He is completing outpatient physical therapy. He reports bilateral hearing loss that improved after his ears were cleaned by his primary physician. The hearing loss in the right ear has returned.  He no longer has rectal pain. He reports nocturia.  Objective:  Vital signs in last 24 hours:  There were no vitals taken for this visit.    HEENT: Moderate cerumen at the right external canal, minimal cerumen at the left external canal. Oral cavity without thrush or ulcers Resp: Lungs clear bilaterally Cardio: Regular rate and rhythm GI: No hepatomegaly, nontender, no mass Vascular: No leg edema Neuro: The motor exam appears intact in the upper and lower extremity bilaterally      Lab Results:  Lab Results  Component Value Date   WBC 5.9 06/19/2014   HGB 10.9* 06/19/2014   HCT 35.3* 06/19/2014   MCV 90.1 06/19/2014   PLT 709* 06/19/2014   NEUTROABS 4.0 06/19/2014     Medications: I have reviewed the patient's current medications.  Assessment/Plan: 1. 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 Left frontal meningioma, WHO grade 1, status post resection 04/06/2014 3. Left leg weakness secondary to #1 4. 1 cmbrain lesion at the inferior falx noted on the MRI 03/21/2014, not resected 5. Diabetes  6. Hypertension  7. Hyperlipidemia 8. Neutropenia following cycle 1 carboplatin/etoposide.   Disposition:  He appears well. The plan is to proceed with cycle 3 chemotherapy today. He will undergo a restaging CT evaluation after this cycle. Ivan Stark will return for an office visit and cycle 4 chemotherapy on 08/14/2014. He plans to see Dr. Melford Aase to evaluate the fullness and hearing loss at the right ear.  Betsy Coder, MD  07/17/2014  9:49 AM

## 2014-07-17 NOTE — Patient Instructions (Signed)
[facility] Discharge Instructions for Patients Receiving Chemotherapy  Today you received the following chemotherapy agents carboplatin/etoposide.    To help prevent nausea and vomiting after your treatment, we encourage you to take your nausea medication as directed.    If you develop nausea and vomiting that is not controlled by your nausea medication, call the clinic.   BELOW ARE SYMPTOMS THAT SHOULD BE REPORTED IMMEDIATELY:  *FEVER GREATER THAN 100.5 F  *CHILLS WITH OR WITHOUT FEVER  NAUSEA AND VOMITING THAT IS NOT CONTROLLED WITH YOUR NAUSEA MEDICATION  *UNUSUAL SHORTNESS OF BREATH  *UNUSUAL BRUISING OR BLEEDING  TENDERNESS IN MOUTH AND THROAT WITH OR WITHOUT PRESENCE OF ULCERS  *URINARY PROBLEMS  *BOWEL PROBLEMS  UNUSUAL RASH Items with * indicate a potential emergency and should be followed up as soon as possible.  Feel free to call the clinic you have any questions or concerns. The clinic phone number is [phone].  

## 2014-07-17 NOTE — Telephone Encounter (Signed)
Per staff message and POF I have scheduled appts. Advised scheduler of appts. JMW  

## 2014-07-17 NOTE — Telephone Encounter (Signed)
Pt confirmed labs/ov per 02/09 POF, gave pt AVS.... KJ, sent msg to add chemo °

## 2014-07-18 ENCOUNTER — Ambulatory Visit (HOSPITAL_BASED_OUTPATIENT_CLINIC_OR_DEPARTMENT_OTHER): Payer: Medicare Other

## 2014-07-18 DIAGNOSIS — C61 Malignant neoplasm of prostate: Secondary | ICD-10-CM

## 2014-07-18 DIAGNOSIS — Z5111 Encounter for antineoplastic chemotherapy: Secondary | ICD-10-CM

## 2014-07-18 DIAGNOSIS — C7A1 Malignant poorly differentiated neuroendocrine tumors: Secondary | ICD-10-CM

## 2014-07-18 DIAGNOSIS — C7B8 Other secondary neuroendocrine tumors: Secondary | ICD-10-CM

## 2014-07-18 MED ORDER — ONDANSETRON 8 MG/NS 50 ML IVPB
INTRAVENOUS | Status: AC
  Filled 2014-07-18: qty 8

## 2014-07-18 MED ORDER — SODIUM CHLORIDE 0.9 % IV SOLN
Freq: Once | INTRAVENOUS | Status: AC
  Administered 2014-07-18: 11:00:00 via INTRAVENOUS

## 2014-07-18 MED ORDER — ONDANSETRON 8 MG/50ML IVPB (CHCC)
8.0000 mg | Freq: Once | INTRAVENOUS | Status: AC
  Administered 2014-07-18: 8 mg via INTRAVENOUS

## 2014-07-18 MED ORDER — SODIUM CHLORIDE 0.9 % IV SOLN
100.0000 mg/m2 | Freq: Once | INTRAVENOUS | Status: AC
  Administered 2014-07-18: 220 mg via INTRAVENOUS
  Filled 2014-07-18: qty 11

## 2014-07-18 MED ORDER — DEXAMETHASONE SODIUM PHOSPHATE 10 MG/ML IJ SOLN
10.0000 mg | Freq: Once | INTRAMUSCULAR | Status: AC
  Administered 2014-07-18: 10 mg via INTRAVENOUS

## 2014-07-18 MED ORDER — DEXAMETHASONE SODIUM PHOSPHATE 10 MG/ML IJ SOLN
INTRAMUSCULAR | Status: AC
  Filled 2014-07-18: qty 1

## 2014-07-18 NOTE — Patient Instructions (Signed)
[facility] Discharge Instructions for Patients Receiving Chemotherapy  Today you received the following chemotherapy agents Etoposide.  To help prevent nausea and vomiting after your treatment, we encourage you to take your nausea medication.   If you develop nausea and vomiting that is not controlled by your nausea medication, call the clinic.   BELOW ARE SYMPTOMS THAT SHOULD BE REPORTED IMMEDIATELY:  *FEVER GREATER THAN 100.5 F  *CHILLS WITH OR WITHOUT FEVER  NAUSEA AND VOMITING THAT IS NOT CONTROLLED WITH YOUR NAUSEA MEDICATION  *UNUSUAL SHORTNESS OF BREATH  *UNUSUAL BRUISING OR BLEEDING  TENDERNESS IN MOUTH AND THROAT WITH OR WITHOUT PRESENCE OF ULCERS  *URINARY PROBLEMS  *BOWEL PROBLEMS  UNUSUAL RASH Items with * indicate a potential emergency and should be followed up as soon as possible.  Feel free to call the clinic you have any questions or concerns. The clinic phone number is [phone].    

## 2014-07-19 ENCOUNTER — Ambulatory Visit (HOSPITAL_BASED_OUTPATIENT_CLINIC_OR_DEPARTMENT_OTHER): Payer: Medicare Other

## 2014-07-19 DIAGNOSIS — C7931 Secondary malignant neoplasm of brain: Secondary | ICD-10-CM

## 2014-07-19 DIAGNOSIS — Z5111 Encounter for antineoplastic chemotherapy: Secondary | ICD-10-CM

## 2014-07-19 DIAGNOSIS — C61 Malignant neoplasm of prostate: Secondary | ICD-10-CM

## 2014-07-19 MED ORDER — ONDANSETRON 8 MG/NS 50 ML IVPB
INTRAVENOUS | Status: AC
  Filled 2014-07-19: qty 8

## 2014-07-19 MED ORDER — SODIUM CHLORIDE 0.9 % IV SOLN
100.0000 mg/m2 | Freq: Once | INTRAVENOUS | Status: AC
  Administered 2014-07-19: 220 mg via INTRAVENOUS
  Filled 2014-07-19: qty 11

## 2014-07-19 MED ORDER — SODIUM CHLORIDE 0.9 % IV SOLN
Freq: Once | INTRAVENOUS | Status: AC
  Administered 2014-07-19: 10:00:00 via INTRAVENOUS

## 2014-07-19 MED ORDER — ONDANSETRON 8 MG/50ML IVPB (CHCC)
8.0000 mg | Freq: Once | INTRAVENOUS | Status: AC
  Administered 2014-07-19: 8 mg via INTRAVENOUS

## 2014-07-19 MED ORDER — DEXAMETHASONE SODIUM PHOSPHATE 10 MG/ML IJ SOLN
10.0000 mg | Freq: Once | INTRAMUSCULAR | Status: AC
  Administered 2014-07-19: 10 mg via INTRAVENOUS

## 2014-07-19 MED ORDER — DEXAMETHASONE SODIUM PHOSPHATE 10 MG/ML IJ SOLN
INTRAMUSCULAR | Status: AC
  Filled 2014-07-19: qty 1

## 2014-07-19 NOTE — Patient Instructions (Signed)
[facility] Discharge Instructions for Patients Receiving Chemotherapy  Today you received the following chemotherapy agents Etoposide  To help prevent nausea and vomiting after your treatment, we encourage you to take your nausea medication as directed/prescribed   If you develop nausea and vomiting that is not controlled by your nausea medication, call the clinic.   BELOW ARE SYMPTOMS THAT SHOULD BE REPORTED IMMEDIATELY:  *FEVER GREATER THAN 100.5 F  *CHILLS WITH OR WITHOUT FEVER  NAUSEA AND VOMITING THAT IS NOT CONTROLLED WITH YOUR NAUSEA MEDICATION  *UNUSUAL SHORTNESS OF BREATH  *UNUSUAL BRUISING OR BLEEDING  TENDERNESS IN MOUTH AND THROAT WITH OR WITHOUT PRESENCE OF ULCERS  *URINARY PROBLEMS  *BOWEL PROBLEMS  UNUSUAL RASH Items with * indicate a potential emergency and should be followed up as soon as possible.  Feel free to call the clinic you have any questions or concerns. The clinic phone number is [phone].    

## 2014-07-20 ENCOUNTER — Other Ambulatory Visit: Payer: Self-pay | Admitting: Physical Medicine and Rehabilitation

## 2014-07-20 ENCOUNTER — Telehealth: Payer: Self-pay | Admitting: *Deleted

## 2014-07-20 ENCOUNTER — Ambulatory Visit (HOSPITAL_BASED_OUTPATIENT_CLINIC_OR_DEPARTMENT_OTHER): Payer: Medicare Other

## 2014-07-20 DIAGNOSIS — Z5189 Encounter for other specified aftercare: Secondary | ICD-10-CM

## 2014-07-20 DIAGNOSIS — C7B8 Other secondary neuroendocrine tumors: Secondary | ICD-10-CM

## 2014-07-20 DIAGNOSIS — C7A1 Malignant poorly differentiated neuroendocrine tumors: Secondary | ICD-10-CM

## 2014-07-20 DIAGNOSIS — C61 Malignant neoplasm of prostate: Secondary | ICD-10-CM

## 2014-07-20 MED ORDER — PEGFILGRASTIM INJECTION 6 MG/0.6ML ~~LOC~~
6.0000 mg | PREFILLED_SYRINGE | Freq: Once | SUBCUTANEOUS | Status: AC
  Administered 2014-07-20: 6 mg via SUBCUTANEOUS
  Filled 2014-07-20: qty 0.6

## 2014-07-20 NOTE — Telephone Encounter (Signed)
Patient called complaining of constipation. Informed patient of available senna prescription at pharmacy. Patient verbalized understanding.

## 2014-07-23 ENCOUNTER — Ambulatory Visit: Payer: Medicare Other

## 2014-07-23 ENCOUNTER — Ambulatory Visit: Payer: Self-pay

## 2014-07-25 ENCOUNTER — Ambulatory Visit: Payer: Medicare Other

## 2014-07-25 DIAGNOSIS — R269 Unspecified abnormalities of gait and mobility: Secondary | ICD-10-CM

## 2014-07-25 DIAGNOSIS — C61 Malignant neoplasm of prostate: Secondary | ICD-10-CM | POA: Diagnosis not present

## 2014-07-25 NOTE — Patient Instructions (Signed)
Abduction: Clam - Side-Lying   Lie on side with knees bent. Lift top knee, keeping feet together. Keep trunk steady. Slowly lower knee. _10__ reps per set, _3__ sets per day, _3__ days per week. Repeat with both legs. Add  theraband around knees, when this becomes easy.  Copyright  VHI. All rights reserved.  "I love a Nurse, learning disability with one hand, march in place, alternating legs.  Repeat _10___ times per leg, perform 3 sets of 10. Do __3__ sessions per day.  http://gt2.exer.us/344   Copyright  VHI. All rights reserved.   Hip Extension   Lift leg up in the air and bring it back down 10 times. Repeat with other leg 10 times. Repeat __2__ times per leg. Do __3__ sessions per week.  http://gt2.exer.us/387   Copyright  VHI. All rights reserved.   Hamstring Curl: Resisted (Prone)   Lying on your stomach with leg straight, bend knee. Repeat with other leg. Don't use weights right now. Repeat __10__ times per set. Do __2__ sets per session per leg. Do __3__ sessions per week. Progress by performing 3 sets of 10.  http://orth.exer.us/670   Copyright  VHI. All rights reserved.   Bridge   Lie back, legs bent. Inhale, pressing hips up. Keeping ribs in, lengthen lower back and then lower back to mat. Repeat _10___ times. Do __1__ sessions per day.  Copyright  VHI. All rights reserved.

## 2014-07-25 NOTE — Therapy (Signed)
[facility] [address] [place], [place], [address] Phone: [phone]   Fax:  [phone]  Physical Therapy Treatment  Patient Details  Name: Ivan Stark MRN: [id number] Date of Birth: 1945-12-07 Referring Provider:  Unk Pinto, MD  Encounter Date: 07/25/2014      PT End of Session - 07/25/14 1138    Visit Number 3   Number of Visits 17   Date for PT Re-Evaluation 09/09/14   Authorization Type G-code every 10th visit.   PT Start Time [phone]   PT Stop Time 1013   PT Time Calculation (min) 42 min   Activity Tolerance Patient tolerated treatment well   Behavior During Therapy WFL for tasks assessed/performed      Past Medical History  Diagnosis Date  . Diabetes mellitus without complication   . Hypertension   . Hyperlipidemia   . GERD (gastroesophageal reflux disease)   . Vitamin D deficiency   . History of colon polyps   . Complication of anesthesia     Difficult to wake up; and severe nausea and vomiting  . PONV (postoperative nausea and vomiting)     Past Surgical History  Procedure Laterality Date  . Tonsillectomy    . Colonoscopy w/ polypectomy    . Esophagus surgery      Had esophagus stretched due to food getting trapped in his throat  . Craniotomy Bilateral 04/06/2014    Procedure: Bilateral Fronto-parietal craniotomy for resection of tumors with Curve;  Surgeon: Consuella Lose, MD;  Location: [facility];  Service: Neurosurgery;  Laterality: Bilateral;  Bilateral Fronto-parietal craniotomy for resection of tumors with Curve  . Colonoscopy N/A 04/19/2014    Procedure: COLONOSCOPY;  Surgeon: Winfield Cunas., MD;  Location: [facility];  Service: Endoscopy;  Laterality: N/A;    There were no vitals taken for this visit.  Visit Diagnosis:  Abnormality of gait      Subjective Assessment - 07/25/14 0933    Symptoms Pt reported he had chemo last week, and is fatigued after chemo  treatment. Pt unable to perform balance HEP due to fatigue. Pt reported he brought fireplace wood in from outside without the RW and did not fall.    Pertinent History Active prostate cancer with brain metastasis   Patient Stated Goals Walk without RW   Currently in Pain? No/denies     Therex: -Standing at counter with 1-2 UE support: marches 3x10/LE, hip ext/abd x10/LE. VC's for technique and to improve upright posture. Pt unable to perform hip ext/abd. Without compensation, so exercises ceased after 10 reps. -Prone: hamstring curls 2x10/LE, hip ext. 2x10/LE. VC's for technique. -Bridges: x10. VC's for technique and to activate TrA to improve core stability. -Sidelying: clamshells 3x10. VC's to keep hips forward and for technique. Pt required rest breaks after each set due to fatigue, pt noted increased fatigue while performing therex with L LE due to weakness.                       PT Education - 07/25/14 1137    Education provided Yes   Education Details Strengthening HEP. PT also discussed trying to perform at least one exercise per day, even on days that he is fatigued due to chemo.   Person(s) Educated Patient   Methods Explanation;Demonstration;Tactile cues;Verbal cues;Handout   Comprehension Verbalized understanding;Returned demonstration;Need further instruction          PT Short Term Goals - 07/25/14 1140  PT SHORT TERM GOAL #1   Title Pt will be independent in HEP to improve functional mobility. Target date: 08/08/14.   Status On-going   PT SHORT TERM GOAL #2   Title Asses BERG and write appropriate STG and LTG. Target date: 08/08/14.   Status Achieved   PT SHORT TERM GOAL #3   Title Pt will ambulated 300', with LRAD, over even/uneven terrain with supervision and no LOB to improve functional mobillity. Target date: 08/08/14.   Status On-going   PT SHORT TERM GOAL #4   Title Pt will report zero falls in the last 4 weeks to improve safety during functional  mobility. Target date: 08/08/14.   Status On-going   PT SHORT TERM GOAL #5   Title Pt will improve BERG balance score to >/=41/56 to decrease falls risk. Target date: 08/08/14.   Status On-going           PT Long Term Goals - 07/25/14 1145    PT LONG TERM GOAL #1   Title Pt will verbalize understanding of falls prevention strategies to decrease falls risk. Target date: 09/05/14.   Status On-going   PT LONG TERM GOAL #2   Title Pt will ambulate 600', with LRAD, over even/uneven terrain at MOD I level in order to improve functional mobility at home and at work. Target date: 09/05/14.   Status On-going   PT LONG TERM GOAL #3   Title Have pt complete FOTO and write appropriate goal. Target date: 09/05/14.   Status On-going   PT LONG TERM GOAL #4   Title Pt will perform sit<>stand x10 with no UE support to improve safety during functional mobility. Target date: 09/05/14.   Status On-going   PT LONG TERM GOAL #5   Title Pt will improve gait speed, with LRAD, to >/=2.38ft/sec. to ambulate safely in the community. Target date: 09/05/14.   Status On-going   PT LONG TERM GOAL #6   Title Pt will improve BERG balance score to >/=45/56 to decrease falls risk. Target date: 09/05/14.   Status On-going               Plan - 07/25/14 1138    Clinical Impression Statement Pt had difficulty performing standing hip ext/abd. and would compensate with stronger muscles groups or perform lateral/ant. trunk lean. However, pt tolerated mat exercises well. Pt would continue to benefit from skilled PT to improve safety during functional mobility.    Pt will benefit from skilled therapeutic intervention in order to improve on the following deficits Abnormal gait;Impaired flexibility;Decreased range of motion;Decreased endurance;Decreased knowledge of use of DME;Decreased balance;Decreased mobility;Decreased strength   Rehab Potential Good   PT Frequency 2x / week   PT Duration 8 weeks   PT  Treatment/Interventions ADLs/Self Care Home Management;Gait training;Neuromuscular re-education;Biofeedback;Functional mobility training;Patient/family education;Therapeutic activities;Therapeutic exercise;Manual techniques;Balance training;DME Instruction   PT Next Visit Plan Have finish FOTO. Progress ambulation with SPC, dynamic balance training.   PT Home Exercise Plan Balance and strength HEP.   Consulted and Agree with Plan of Care Patient        Problem List Patient Active Problem List   Diagnosis Date Noted  . Petechial rash 06/05/2014  . Hypotension 05/22/2014  . Immobility 05/22/2014  . Anorexia 05/22/2014  . Dehydration 05/22/2014  . Hypoalbuminemia 05/22/2014  . Desquamated skin 05/22/2014  . Small cell carcinoma of prostate   . Vision disturbance   . Cerebral meningioma   . Left flaccid hemiparesis   . Left hemiparesis 04/09/2014  .  Brain metastases 04/06/2014  . Poor compliance with F/U 02/04/2014  . CKD stage 1 due to type 2 diabetes mellitus 08/20/2013  . Encounter for long-term (current) use of other medications 08/20/2013  . Hypertension   . Hyperlipidemia   . GERD (gastroesophageal reflux disease)   . Vitamin D deficiency   . History of colon polyps     Miller,Jennifer L 07/25/2014, 12:59 PM  [facility] [address] [place], [place], [address] Phone: [phone]   Fax:  [phone]     Geoffry Paradise, PT,DPT 07/25/2014 12:59 PM Phone: [phone] Fax: [phone]

## 2014-07-31 ENCOUNTER — Ambulatory Visit: Payer: Medicare Other

## 2014-07-31 DIAGNOSIS — C61 Malignant neoplasm of prostate: Secondary | ICD-10-CM | POA: Diagnosis not present

## 2014-07-31 DIAGNOSIS — R269 Unspecified abnormalities of gait and mobility: Secondary | ICD-10-CM

## 2014-07-31 NOTE — Therapy (Signed)
[facility] [address] [place], [place], [address] Phone: [phone]   Fax:  [phone]  Physical Therapy Treatment  Patient Details  Name: Ivan Stark MRN: [id number] Date of Birth: [birth year] Referring Provider:  Unk Pinto, MD  Encounter Date: 07/31/2014      PT End of Session - 07/31/14 1704    Visit Number 4   Number of Visits 17   Date for PT Re-Evaluation 09/09/14   Authorization Type G-code every 10th visit.   PT Start Time [phone]   PT Stop Time 1014   PT Time Calculation (min) 41 min   Equipment Utilized During Treatment Gait belt   Activity Tolerance Patient tolerated treatment well   Behavior During Therapy WFL for tasks assessed/performed      Past Medical History  Diagnosis Date  . Diabetes mellitus without complication   . Hypertension   . Hyperlipidemia   . GERD (gastroesophageal reflux disease)   . Vitamin D deficiency   . History of colon polyps   . Complication of anesthesia     Difficult to wake up; and severe nausea and vomiting  . PONV (postoperative nausea and vomiting)     Past Surgical History  Procedure Laterality Date  . Tonsillectomy    . Colonoscopy w/ polypectomy    . Esophagus surgery      Had esophagus stretched due to food getting trapped in his throat  . Craniotomy Bilateral 04/06/2014    Procedure: Bilateral Fronto-parietal craniotomy for resection of tumors with Curve;  Surgeon: Consuella Lose, MD;  Location: [facility];  Service: Neurosurgery;  Laterality: Bilateral;  Bilateral Fronto-parietal craniotomy for resection of tumors with Curve  . Colonoscopy N/A 04/19/2014    Procedure: COLONOSCOPY;  Surgeon: Winfield Cunas., MD;  Location: [facility];  Service: Endoscopy;  Laterality: N/A;    There were no vitals taken for this visit.  Visit Diagnosis:  Abnormality of gait      Subjective Assessment - 07/31/14 0936    Symptoms Pt denied falls  or changes since last visit. Pt reported he went to the park with his grandkids this weekend, he was able to walk around the play area.    Pertinent History Active prostate cancer with brain metastasis   Patient Stated Goals Walk without RW   Currently in Pain? Yes   Pain Score --  0.5/10   Pain Location Leg  pt reports the pain is from injection which incr. WBCs.   Pain Orientation Left   Pain Descriptors / Indicators Aching   Pain Type Acute pain   Pain Onset In the past 7 days   Pain Frequency Intermittent   Aggravating Factors  None   Pain Relieving Factors Motrin          [facility] PT Assessment - 07/31/14 0940    Observation/Other Assessments   Focus on Therapeutic Outcomes (FOTO)  ABC: 57.5%     PT assisted pt with completing FOTO questionnaire as pt had difficulty operating the tablet. Pt answered all questions based on his feelings and not the PT.             [facility] Adult PT Treatment/Exercise - 07/31/14 0940    Ambulation/Gait   Ambulation/Gait Yes   Ambulation/Gait Assistance 4: Min guard   Ambulation/Gait Assistance Details Pt ambulated over even terrain. VC's and demonstration for sequencing and technique for SPC. Pt ambulated in 3-point gait manner vs. 2-point with SPC due to difficulty with sequencing.  Pt required two seated rest breaks due to fatigue.   Ambulation Distance (Feet) --  230', 20', and 117' with SPC, 69' with RW   Assistive device Straight cane   Gait Pattern Step-through pattern;Decreased dorsiflexion - left;Left steppage;Right flexed knee in stance;Left flexed knee in stance;Decreased trunk rotation   Ambulation Surface Level;Indoor                  PT Short Term Goals - 07/25/14 1140    PT SHORT TERM GOAL #1   Title Pt will be independent in HEP to improve functional mobility. Target date: 08/08/14.   Status On-going   PT SHORT TERM GOAL #2   Title Asses BERG and write appropriate STG and LTG. Target date: 08/08/14.   Status  Achieved   PT SHORT TERM GOAL #3   Title Pt will ambulated 300', with LRAD, over even/uneven terrain with supervision and no LOB to improve functional mobillity. Target date: 08/08/14.   Status On-going   PT SHORT TERM GOAL #4   Title Pt will report zero falls in the last 4 weeks to improve safety during functional mobility. Target date: 08/08/14.   Status On-going   PT SHORT TERM GOAL #5   Title Pt will improve BERG balance score to >/=41/56 to decrease falls risk. Target date: 08/08/14.   Status On-going           PT Long Term Goals - 07/31/14 1706    PT LONG TERM GOAL #1   Title Pt will verbalize understanding of falls prevention strategies to decrease falls risk. Target date: 09/05/14.   Status On-going   PT LONG TERM GOAL #2   Title Pt will ambulate 600', with LRAD, over even/uneven terrain at MOD I level in order to improve functional mobility at home and at work. Target date: 09/05/14.   Status On-going   PT LONG TERM GOAL #3   Title Have pt complete FOTO and write appropriate goal. Target date: 09/05/14.   Status Achieved   PT LONG TERM GOAL #4   Title Pt will perform sit<>stand x10 with no UE support to improve safety during functional mobility. Target date: 09/05/14.   Status On-going   PT LONG TERM GOAL #5   Title Pt will improve gait speed, with LRAD, to >/=2.12ft/sec. to ambulate safely in the community. Target date: 09/05/14.   Status On-going   Additional Long Term Goals   Additional Long Term Goals Yes   PT LONG TERM GOAL #6   Title Pt will improve BERG balance score to >/=45/56 to decrease falls risk. Target date: 09/05/14.   Status On-going   PT LONG TERM GOAL #7   Title Pt will improve ABC score by 10% to improve quality of life. Target date: 09/05/14.   Status New               Plan - 07/31/14 1704    Clinical Impression Statement Pt demonstrating progress as he ambulated with SPC vs. RW during PT today. Pt required cuing for sequencing and technique but did  not experience LOB. Pt did require rest breaks due to fatigue. Pt would continue to benefit from skilled PT to improve safety during functional mobility.   Pt will benefit from skilled therapeutic intervention in order to improve on the following deficits Abnormal gait;Impaired flexibility;Decreased range of motion;Decreased endurance;Decreased knowledge of use of DME;Decreased balance;Decreased mobility;Decreased strength   Rehab Potential Good   PT Frequency 2x / week   PT Duration 8  weeks   PT Treatment/Interventions ADLs/Self Care Home Management;Gait training;Neuromuscular re-education;Biofeedback;Functional mobility training;Patient/family education;Therapeutic activities;Therapeutic exercise;Manual techniques;Balance training;DME Instruction   PT Next Visit Plan Progress ambulation with SPC, dynamic balance training.   PT Home Exercise Plan Balance and strength HEP.   Consulted and Agree with Plan of Care Patient        Problem List Patient Active Problem List   Diagnosis Date Noted  . Petechial rash 06/05/2014  . Hypotension 05/22/2014  . Immobility 05/22/2014  . Anorexia 05/22/2014  . Dehydration 05/22/2014  . Hypoalbuminemia 05/22/2014  . Desquamated skin 05/22/2014  . Small cell carcinoma of prostate   . Vision disturbance   . Cerebral meningioma   . Left flaccid hemiparesis   . Left hemiparesis 04/09/2014  . Brain metastases 04/06/2014  . Poor compliance with F/U 02/04/2014  . CKD stage 1 due to type 2 diabetes mellitus 08/20/2013  . Encounter for long-term (current) use of other medications 08/20/2013  . Hypertension   . Hyperlipidemia   . GERD (gastroesophageal reflux disease)   . Vitamin D deficiency   . History of colon polyps     Andrya Roppolo L 07/31/2014, 5:07 PM  [facility] [address] [place], [place], [address] Phone: [phone]   Fax:  [phone]     Geoffry Paradise,  PT,DPT 07/31/2014 5:08 PM Phone: [phone] Fax: [phone]

## 2014-08-03 ENCOUNTER — Ambulatory Visit: Payer: Medicare Other

## 2014-08-03 DIAGNOSIS — C61 Malignant neoplasm of prostate: Secondary | ICD-10-CM | POA: Diagnosis not present

## 2014-08-03 DIAGNOSIS — R269 Unspecified abnormalities of gait and mobility: Secondary | ICD-10-CM

## 2014-08-03 NOTE — Therapy (Signed)
[facility] [address] [place], [place], [address] Phone: [phone]   Fax:  [phone]  Physical Therapy Treatment  Patient Details  Name: Ivan Stark MRN: [id number] Date of Birth: 17-Apr-1946 Referring Provider:  Unk Pinto, MD  Encounter Date: 08/03/2014      PT End of Session - 08/03/14 1626    Visit Number 5   Number of Visits 17   Date for PT Re-Evaluation 09/09/14   Authorization Type G-code every 10th visit.   PT Start Time [phone]   PT Stop Time 1012   PT Time Calculation (min) 41 min   Equipment Utilized During Treatment Gait belt   Activity Tolerance Patient tolerated treatment well   Behavior During Therapy WFL for tasks assessed/performed      Past Medical History  Diagnosis Date  . Diabetes mellitus without complication   . Hypertension   . Hyperlipidemia   . GERD (gastroesophageal reflux disease)   . Vitamin D deficiency   . History of colon polyps   . Complication of anesthesia     Difficult to wake up; and severe nausea and vomiting  . PONV (postoperative nausea and vomiting)     Past Surgical History  Procedure Laterality Date  . Tonsillectomy    . Colonoscopy w/ polypectomy    . Esophagus surgery      Had esophagus stretched due to food getting trapped in his throat  . Craniotomy Bilateral 04/06/2014    Procedure: Bilateral Fronto-parietal craniotomy for resection of tumors with Curve;  Surgeon: Consuella Lose, MD;  Location: [facility];  Service: Neurosurgery;  Laterality: Bilateral;  Bilateral Fronto-parietal craniotomy for resection of tumors with Curve  . Colonoscopy N/A 04/19/2014    Procedure: COLONOSCOPY;  Surgeon: Winfield Cunas., MD;  Location: [facility] ENDOSCOPY;  Service: Endoscopy;  Laterality: N/A;    There were no vitals taken for this visit.  Visit Diagnosis:  Abnormality of gait      Subjective Assessment - 08/03/14 0935    Symptoms Pt denied falls  or changes since last visit. Pt reported he went shopping and out to eat with his wife yestderday, and did not experience any LOB episodes.   Pertinent History Active prostate cancer with brain metastasis   Patient Stated Goals Walk without RW   Currently in Pain? No/denies                    [facility] Adult PT Treatment/Exercise - 08/03/14 0936    Ambulation/Gait   Ambulation/Gait Yes   Ambulation/Gait Assistance 4: Min guard   Ambulation/Gait Assistance Details Pt ambulated over even terrain. VC's for sequencing, technique, upright posture. Pt progressed to supervision while ambulating on straight path but did require min guard while traversing turns. Pt progressed to 2-point gait with SPC. Pt experienced 2 LOB episodes around turns but self corrected with stepping strategy. Pt required two seated rest breaks during ambulation due to fatigue.   Ambulation Distance (Feet) --  460', 75', 50'   Assistive device Straight cane   Gait Pattern Step-through pattern;Decreased dorsiflexion - left;Left steppage;Right flexed knee in stance;Left flexed knee in stance;Decreased trunk rotation   Ambulation Surface Level;Indoor   Balance   Balance Assessed Yes   Dynamic Standing Balance   Dynamic Standing - Balance Support No upper extremity supported   Dynamic Standing - Level of Assistance 4: Min assist;Other (comment)  min guard   Dynamic Standing - Balance Activities Lateral lean/weight shifting;Other (comment);Eyes open  Dynamic Standing - Comments B LE's on non-compliant surface: pt performed single cone taps 4x5 cones/LE. VC's to improve lateral weight shifting. Pt experienced 6 LOB episodes which required min A to maintain balance, otherwise, min guard.                  PT Short Term Goals - 07/25/14 1140    PT SHORT TERM GOAL #1   Title Pt will be independent in HEP to improve functional mobility. Target date: 08/08/14.   Status On-going   PT SHORT TERM GOAL #2   Title  Asses BERG and write appropriate STG and LTG. Target date: 08/08/14.   Status Achieved   PT SHORT TERM GOAL #3   Title Pt will ambulated 300', with LRAD, over even/uneven terrain with supervision and no LOB to improve functional mobillity. Target date: 08/08/14.   Status On-going   PT SHORT TERM GOAL #4   Title Pt will report zero falls in the last 4 weeks to improve safety during functional mobility. Target date: 08/08/14.   Status On-going   PT SHORT TERM GOAL #5   Title Pt will improve BERG balance score to >/=41/56 to decrease falls risk. Target date: 08/08/14.   Status On-going           PT Long Term Goals - 08/03/14 1628    PT LONG TERM GOAL #1   Title Pt will verbalize understanding of falls prevention strategies to decrease falls risk. Target date: 09/05/14.   Status On-going   PT LONG TERM GOAL #2   Title Pt will ambulate 600', with LRAD, over even/uneven terrain at MOD I level in order to improve functional mobility at home and at work. Target date: 09/05/14.   Status On-going   PT LONG TERM GOAL #3   Title Have pt complete FOTO and write appropriate goal. Target date: 09/05/14.   Status Achieved   PT LONG TERM GOAL #4   Title Pt will perform sit<>stand x10 with no UE support to improve safety during functional mobility. Target date: 09/05/14.   Status On-going   PT LONG TERM GOAL #5   Title Pt will improve gait speed, with LRAD, to >/=2.25ft/sec. to ambulate safely in the community. Target date: 09/05/14.   Status On-going   PT LONG TERM GOAL #6   Title Pt will improve BERG balance score to >/=45/56 to decrease falls risk. Target date: 09/05/14.   Status On-going   PT LONG TERM GOAL #7   Title Pt will improve ABC score by 10% to improve quality of life. Target date: 09/05/14.   Status On-going               Plan - 08/03/14 1626    Clinical Impression Statement Pt demonstrated progress as he was able to progress from 3-point gait pattern with SPC to 2-point gait pattern.  Pt continues to require cues to improve stride length and upright posture during ambulation, due to weakness. Pt experience LOB during cone taps due to decreased lateral weight shifting and fatigue. Continue with POC.   Pt will benefit from skilled therapeutic intervention in order to improve on the following deficits Abnormal gait;Impaired flexibility;Decreased range of motion;Decreased endurance;Decreased knowledge of use of DME;Decreased balance;Decreased mobility;Decreased strength   Rehab Potential Good   PT Frequency 2x / week   PT Duration 8 weeks   PT Treatment/Interventions ADLs/Self Care Home Management;Gait training;Neuromuscular re-education;Biofeedback;Functional mobility training;Patient/family education;Therapeutic activities;Therapeutic exercise;Manual techniques;Balance training;DME Instruction   PT Next Visit Plan Begin  to assess STGs.   PT Home Exercise Plan Balance and strength HEP.   Consulted and Agree with Plan of Care Patient        Problem List Patient Active Problem List   Diagnosis Date Noted  . Petechial rash 06/05/2014  . Hypotension 05/22/2014  . Immobility 05/22/2014  . Anorexia 05/22/2014  . Dehydration 05/22/2014  . Hypoalbuminemia 05/22/2014  . Desquamated skin 05/22/2014  . Small cell carcinoma of prostate   . Vision disturbance   . Cerebral meningioma   . Left flaccid hemiparesis   . Left hemiparesis 04/09/2014  . Brain metastases 04/06/2014  . Poor compliance with F/U 02/04/2014  . CKD stage 1 due to type 2 diabetes mellitus 08/20/2013  . Encounter for long-term (current) use of other medications 08/20/2013  . Hypertension   . Hyperlipidemia   . GERD (gastroesophageal reflux disease)   . Vitamin D deficiency   . History of colon polyps     Bueford Arp L 08/03/2014, 4:29 PM  [facility] [address] [place], [place], [address] Phone: [phone]   Fax:   [phone]     Geoffry Paradise, PT,DPT 08/03/2014 4:29 PM Phone: [phone] Fax: [phone]

## 2014-08-07 ENCOUNTER — Ambulatory Visit: Payer: Medicare Other | Attending: Physical Medicine & Rehabilitation

## 2014-08-07 DIAGNOSIS — K219 Gastro-esophageal reflux disease without esophagitis: Secondary | ICD-10-CM | POA: Diagnosis not present

## 2014-08-07 DIAGNOSIS — I129 Hypertensive chronic kidney disease with stage 1 through stage 4 chronic kidney disease, or unspecified chronic kidney disease: Secondary | ICD-10-CM | POA: Diagnosis not present

## 2014-08-07 DIAGNOSIS — E785 Hyperlipidemia, unspecified: Secondary | ICD-10-CM | POA: Insufficient documentation

## 2014-08-07 DIAGNOSIS — R269 Unspecified abnormalities of gait and mobility: Secondary | ICD-10-CM | POA: Insufficient documentation

## 2014-08-07 DIAGNOSIS — N181 Chronic kidney disease, stage 1: Secondary | ICD-10-CM | POA: Insufficient documentation

## 2014-08-07 DIAGNOSIS — C61 Malignant neoplasm of prostate: Secondary | ICD-10-CM | POA: Diagnosis not present

## 2014-08-07 DIAGNOSIS — C7931 Secondary malignant neoplasm of brain: Secondary | ICD-10-CM | POA: Insufficient documentation

## 2014-08-07 DIAGNOSIS — E559 Vitamin D deficiency, unspecified: Secondary | ICD-10-CM | POA: Insufficient documentation

## 2014-08-07 DIAGNOSIS — E1122 Type 2 diabetes mellitus with diabetic chronic kidney disease: Secondary | ICD-10-CM | POA: Diagnosis not present

## 2014-08-07 DIAGNOSIS — G8194 Hemiplegia, unspecified affecting left nondominant side: Secondary | ICD-10-CM | POA: Diagnosis not present

## 2014-08-07 NOTE — Therapy (Signed)
[facility] [address] [place], [place], [address] Phone: [phone]   Fax:  [phone]  Physical Therapy Treatment  Patient Details  Name: Ivan Stark MRN: [id number] Date of Birth: [birth year] Referring Provider:  Unk Pinto, MD  Encounter Date: 08/07/2014      PT End of Session - 08/07/14 1631    Visit Number 6   Number of Visits 17   Date for PT Re-Evaluation 09/09/14   Authorization Type G-code every 10th visit.   PT Start Time 0932   PT Stop Time 1013   PT Time Calculation (min) 41 min   Equipment Utilized During Treatment Gait belt   Activity Tolerance Patient tolerated treatment well   Behavior During Therapy WFL for tasks assessed/performed      Past Medical History  Diagnosis Date  . Diabetes mellitus without complication   . Hypertension   . Hyperlipidemia   . GERD (gastroesophageal reflux disease)   . Vitamin D deficiency   . History of colon polyps   . Complication of anesthesia     Difficult to wake up; and severe nausea and vomiting  . PONV (postoperative nausea and vomiting)     Past Surgical History  Procedure Laterality Date  . Tonsillectomy    . Colonoscopy w/ polypectomy    . Esophagus surgery      Had esophagus stretched due to food getting trapped in his throat  . Craniotomy Bilateral 04/06/2014    Procedure: Bilateral Fronto-parietal craniotomy for resection of tumors with Curve;  Surgeon: Consuella Lose, MD;  Location: [facility];  Service: Neurosurgery;  Laterality: Bilateral;  Bilateral Fronto-parietal craniotomy for resection of tumors with Curve  . Colonoscopy N/A 04/19/2014    Procedure: COLONOSCOPY;  Surgeon: Winfield Cunas., MD;  Location: [facility] END[facility]COPY;  Service: Endoscopy;  Laterality: N/A;    There were no vitals taken for this visit.  Visit Diagnosis:  Abnormality of gait      Subjective Assessment - 08/07/14 0935    Symptoms Pt denied falls  since last visit. Pt reported he feels a little fatigued, as he had a busy weekend. Pt reported he has been using RW more, in order to improve safety.   Pertinent History Active prostate cancer with brain metastasis   Patient Stated Goals Walk without RW   Currently in Pain? No/denies                    [facility] Adult PT Treatment/Exercise - 08/07/14 0937    Ambulation/Gait   Ambulation/Gait Yes   Ambulation/Gait Assistance 5: Supervision   Ambulation/Gait Assistance Details Pt ambulated over even/uneven terrain with cues while traversing uphill/downhill slopes to improve weight shifting for safety.   Ambulation Distance (Feet) --  325', 37' with RW   Assistive device Rolling walker   Gait Pattern Step-through pattern;Decreased dorsiflexion - left;Left steppage;Right flexed knee in stance;Left flexed knee in stance;Decreased trunk rotation   Ambulation Surface Level;Unlevel;Indoor;Outdoor   Geophysical data processor Standing - Balance Support Right upper extremity supported;No upper extremity supported   Static Standing - Level of Assistance 5: Stand by assistance   Static Standing - Comment/# of Minutes Pt reviewed balance HEP, B LEs, 1-2 sets with 10-30 second holds: feet together with eyes closed, feet together with head turns, modified tandem stance, and single leg stance with 1-2 UE support. VC's for technique, as pt not performing daily.  Standardized Balance Assessment   Standardized Balance Assessment Berg Balance Test   Berg Balance Test   Sit to Stand Able to stand  independently using hands   Standing Unsupported Able to stand safely 2 minutes   Sitting with Back Unsupported but Feet Supported on Floor or Stool Able to sit safely and securely 2 minutes   Stand to Sit Sits safely with minimal use of hands   Transfers Able to transfer safely, definite need of hands   Standing Unsupported with Eyes Closed Able to stand 10 seconds  with supervision   Standing Ubsupported with Feet Together Able to place feet together independently and stand for 1 minute with supervision   From Standing, Reach Forward with Outstretched Arm Can reach confidently >25 cm (10")  11"   From Standing Position, Pick up Object from Floor Able to pick up shoe, needs supervision   From Standing Position, Turn to Look Behind Over each Shoulder Looks behind one side only/other side shows less weight shift   Turn 360 Degrees Able to turn 360 degrees safely but slowly   Standing Unsupported, Alternately Place Feet on Step/Stool Able to complete 4 steps without aid or supervision   Standing Unsupported, One Foot in Front Able to plae foot ahead of the other independently and hold 30 seconds   Standing on One Leg Able to lift leg independently and hold equal to or more than 3 seconds  R LE:3sec.; LLE:1sec.   Total Score 43                PT Education - 08/07/14 1630    Education provided Yes   Education Details Reviewed balance HEP and encouraged pt to perform at lesat once per day.   Person(s) Educated Patient   Methods Explanation;Verbal cues   Comprehension Verbalized understanding;Returned demonstration          PT Short Term Goals - 08/07/14 1633    PT SHORT TERM GOAL #1   Title Pt will be independent in HEP to improve functional mobility. Target date: 08/08/14.   Status On-going   PT SHORT TERM GOAL #2   Title Asses BERG and write appropriate STG and LTG. Target date: 08/08/14.   Status Achieved   PT SHORT TERM GOAL #3   Title Pt will ambulated 300', with LRAD, over even/uneven terrain with supervision and no LOB to improve functional mobillity. Target date: 08/08/14.   Status Achieved   PT SHORT TERM GOAL #4   Title Pt will report zero falls in the last 4 weeks to improve safety during functional mobility. Target date: 08/08/14.   Baseline No falls in last 4 weeks.   Status Achieved   PT SHORT TERM GOAL #5   Title Pt will improve  BERG balance score to >/=41/56 to decrease falls risk. Target date: 08/08/14.   Status Achieved           PT Long Term Goals - 08/03/14 1628    PT LONG TERM GOAL #1   Title Pt will verbalize understanding of falls prevention strategies to decrease falls risk. Target date: 09/05/14.   Status On-going   PT LONG TERM GOAL #2   Title Pt will ambulate 600', with LRAD, over even/uneven terrain at MOD I level in order to improve functional mobility at home and at work. Target date: 09/05/14.   Status On-going   PT LONG TERM GOAL #3   Title Have pt complete FOTO and write appropriate goal. Target date: 09/05/14.  Status Achieved   PT LONG TERM GOAL #4   Title Pt will perform sit<>stand x10 with no UE support to improve safety during functional mobility. Target date: 09/05/14.   Status On-going   PT LONG TERM GOAL #5   Title Pt will improve gait speed, with LRAD, to >/=2.26f/sec. to ambulate safely in the community. Target date: 09/05/14.   Status On-going   PT LONG TERM GOAL #6   Title Pt will improve BERG balance score to >/=45/56 to decrease falls risk. Target date: 09/05/14.   Status On-going   PT LONG TERM GOAL #7   Title Pt will improve ABC score by 10% to improve quality of life. Target date: 09/05/14.   Status On-going               Plan - 08/07/14 1631    Clinical Impression Statement Pt demonstrated progress as he met 3/4 STGs, PT will continue to assess HEP goal at next visit. Pt would continue to benefit from skilled PT to improve safety during functional mobility.    Pt will benefit from skilled therapeutic intervention in order to improve on the following deficits Abnormal gait;Impaired flexibility;Decreased range of motion;Decreased endurance;Decreased knowledge of use of DME;Decreased balance;Decreased mobility;Decreased strength   Rehab Potential Good   PT Frequency 2x / week   PT Duration 8 weeks   PT Treatment/Interventions ADLs/Self Care Home Management;Gait  training;Neuromuscular re-education;Biofeedback;Functional mobility training;Patient/family education;Therapeutic activities;Therapeutic exercise;Manual techniques;Balance training;DME Instruction   PT Next Visit Plan Finishing assessing HEP goal.   PT Home Exercise Plan Balance and strength HEP. gait training with SPC.   Consulted and Agree with Plan of Care Patient        Problem List Patient Active Problem List   Diagnosis Date Noted  . Petechial rash 06/05/2014  . Hypotension 05/22/2014  . Immobility 05/22/2014  . Anorexia 05/22/2014  . Dehydration 05/22/2014  . Hypoalbuminemia 05/22/2014  . Desquamated skin 05/22/2014  . Small cell carcinoma of prostate   . Vision disturbance   . Cerebral meningioma   . Left flaccid hemiparesis   . Left hemiparesis 04/09/2014  . Brain metastases 04/06/2014  . Poor compliance with F/U 02/04/2014  . CKD stage 1 due to type 2 diabetes mellitus 08/20/2013  . Encounter for long-term (current) use of other medications 08/20/2013  . Hypertension   . Hyperlipidemia   . GERD (gastroesophageal reflux disease)   . Vitamin D deficiency   . History of colon polyps     Tajanay Hurley L 08/07/2014, 4:35 PM  [facility] [address]Phone: [phone]  Fax:  [phone]    JGeoffry Paradise PT,DPT 08/07/2014 4:35 PM Phone: [phone]: [phone]

## 2014-08-08 ENCOUNTER — Ambulatory Visit: Payer: Medicare Other

## 2014-08-08 DIAGNOSIS — C61 Malignant neoplasm of prostate: Secondary | ICD-10-CM | POA: Diagnosis not present

## 2014-08-08 DIAGNOSIS — R269 Unspecified abnormalities of gait and mobility: Secondary | ICD-10-CM

## 2014-08-08 NOTE — Therapy (Signed)
[facility] [address] [place], [place], [address] Phone: [phone]   Fax:  [phone]  Physical Therapy Treatment  Patient Details  Name: Ivan Stark MRN: [id number] Date of Birth: 06-Feb-1946 Referring Provider:  Unk Pinto, MD  Encounter Date: 08/08/2014      PT End of Session - 08/08/14 0951    Visit Number 7   Number of Visits 17   Date for PT Re-Evaluation 09/09/14   Authorization Type G-code every 10th visit.   PT Start Time [phone]   PT Stop Time 1015   PT Time Calculation (min) 42 min   Equipment Utilized During Treatment Gait belt   Activity Tolerance Patient tolerated treatment well   Behavior During Therapy WFL for tasks assessed/performed      Past Medical History  Diagnosis Date  . Diabetes mellitus without complication   . Hypertension   . Hyperlipidemia   . GERD (gastroesophageal reflux disease)   . Vitamin D deficiency   . History of colon polyps   . Complication of anesthesia     Difficult to wake up; and severe nausea and vomiting  . PONV (postoperative nausea and vomiting)     Past Surgical History  Procedure Laterality Date  . Tonsillectomy    . Colonoscopy w/ polypectomy    . Esophagus surgery      Had esophagus stretched due to food getting trapped in his throat  . Craniotomy Bilateral 04/06/2014    Procedure: Bilateral Fronto-parietal craniotomy for resection of tumors with Curve;  Surgeon: Consuella Lose, MD;  Location: [facility];  Service: Neurosurgery;  Laterality: Bilateral;  Bilateral Fronto-parietal craniotomy for resection of tumors with Curve  . Colonoscopy N/A 04/19/2014    Procedure: COLONOSCOPY;  Surgeon: Winfield Cunas., MD;  Location: [facility]COPY;  Service: Endoscopy;  Laterality: N/A;    There were no vitals taken for this visit.  Visit Diagnosis:  Abnormality of gait      Subjective Assessment - 08/08/14 0936    Symptoms Pt denied falls  or changes since last visit.    Pertinent History Active prostate cancer with brain metastasis   Patient Stated Goals Walk without RW   Currently in Pain? No/denies       Therex: reviewed -Standing at counter with 1-2 UE support: B hip marches 2x10/LE. VC's for technique and to improve upright posture.  -Prone B LE: hamstring curls 2x10/LE, hip ext. 2x10/LE. VC's for technique and to improve eccentric control. -Bridges: x10. VC's for technique and to activate TrA to improve core stability. -Sidelying: B clamshells 3x10. VC's to keep hips forward and for technique.  Pt experienced increased fatigue while performing therex with L LE due to weakness.     08/08/14 0958  Ambulation/Gait  Ambulation/Gait Yes  Ambulation/Gait Assistance 4: Min guard, 5: supervision  Ambulation/Gait Assistance Details Pt ambulated over even terrain with and without head turns. Pt noted to experience increased postural sway while performing head turns. VC's for upright posture, improve heel strike and sequencing.  Ambulation Distance (Feet) (345', 60', and with SPC)  Assistive device Straight cane  Gait Pattern Step-through pattern;Decreased dorsiflexion - left;Left steppage;Right flexed knee in stance;Left flexed knee in stance;Decreased trunk rotation  Ambulation Surface Level;Indoor  Gait velocity 2.75f/sec. (with SPC)  PT Education - 08/08/14 0950    Education provided Yes   Education Details Reviewed strengthening HEP and educated pt to perform as prescribed.   Person(s) Educated Patient   Methods Explanation;Tactile cues;Verbal cues   Comprehension Verbalized understanding;Returned demonstration          PT Short Term Goals - 08/08/14 0953    PT SHORT TERM GOAL #1   Title Pt will be independent in HEP to improve functional mobility. Target date: 08/08/14.   Status Partially Met   PT SHORT TERM GOAL #2   Title Asses BERG and  write appropriate STG and LTG. Target date: 08/08/14.   Status Achieved   PT SHORT TERM GOAL #3   Title Pt will ambulated 300', with LRAD, over even/uneven terrain with supervision and no LOB to improve functional mobillity. Target date: 08/08/14.   Status Achieved   PT SHORT TERM GOAL #4   Title Pt will report zero falls in the last 4 weeks to improve safety during functional mobility. Target date: 08/08/14.   Baseline No falls in last 4 weeks.   Status Achieved   PT SHORT TERM GOAL #5   Title Pt will improve BERG balance score to >/=41/56 to decrease falls risk. Target date: 08/08/14.   Status Achieved           PT Long Term Goals - 08/03/14 1628    PT LONG TERM GOAL #1   Title Pt will verbalize understanding of falls prevention strategies to decrease falls risk. Target date: 09/05/14.   Status On-going   PT LONG TERM GOAL #2   Title Pt will ambulate 600', with LRAD, over even/uneven terrain at MOD I level in order to improve functional mobility at home and at work. Target date: 09/05/14.   Status On-going   PT LONG TERM GOAL #3   Title Have pt complete FOTO and write appropriate goal. Target date: 09/05/14.   Status Achieved   PT LONG TERM GOAL #4   Title Pt will perform sit<>stand x10 with no UE support to improve safety during functional mobility. Target date: 09/05/14.   Status On-going   PT LONG TERM GOAL #5   Title Pt will improve gait speed, with LRAD, to >/=2.52f/sec. to ambulate safely in the community. Target date: 09/05/14.   Status On-going   PT LONG TERM GOAL #6   Title Pt will improve BERG balance score to >/=45/56 to decrease falls risk. Target date: 09/05/14.   Status On-going   PT LONG TERM GOAL #7   Title Pt will improve ABC score by 10% to improve quality of life. Target date: 09/05/14.   Status On-going               Plan - 08/08/14 1631    Clinical Impression Statement Pt did not meet STG HEP goal, as he required cues for technique and admitted that he has  not been performing as prescribed. Pt demonstrating progress during gait training with SPC, as he required min guard while traversing turns and supervision while ambulating in straight line. Continue with POC.   Pt will benefit from skilled therapeutic intervention in order to improve on the following deficits Abnormal gait;Impaired flexibility;Decreased range of motion;Decreased endurance;Decreased knowledge of use of DME;Decreased balance;Decreased mobility;Decreased strength   Rehab Potential Good   PT Frequency 2x / week   PT Duration 8 weeks   PT Treatment/Interventions ADLs/Self Care Home Management;Gait training;Neuromuscular re-education;Biofeedback;Functional mobility training;Patient/family education;Therapeutic activities;Therapeutic exercise;Manual techniques;Balance training;DME Instruction   PT Next  Visit Plan Dynamic gait training with SPC and balance training.    PT Home Exercise Plan Balance and strength HEP.    Consulted and Agree with Plan of Care Patient        Problem List Patient Active Problem List   Diagnosis Date Noted  . Petechial rash 06/05/2014  . Hypotension 05/22/2014  . Immobility 05/22/2014  . Anorexia 05/22/2014  . Dehydration 05/22/2014  . Hypoalbuminemia 05/22/2014  . Desquamated skin 05/22/2014  . Small cell carcinoma of prostate   . Vision disturbance   . Cerebral meningioma   . Left flaccid hemiparesis   . Left hemiparesis 04/09/2014  . Brain metastases 04/06/2014  . Poor compliance with F/U 02/04/2014  . CKD stage 1 due to type 2 diabetes mellitus 08/20/2013  . Encounter for long-term (current) use of other medications 08/20/2013  . Hypertension   . Hyperlipidemia   . GERD (gastroesophageal reflux disease)   . Vitamin D deficiency   . History of colon polyps     Timisha Mondry L 08/08/2014, 4:35 PM  [facility] [address] [place], [place], [address] Phone: [phone]    Fax:  [phone]   Geoffry Paradise, PT,DPT 08/08/2014 4:35 PM Phone: [phone] Fax: [phone]

## 2014-08-10 ENCOUNTER — Ambulatory Visit (HOSPITAL_COMMUNITY)
Admission: RE | Admit: 2014-08-10 | Discharge: 2014-08-10 | Disposition: A | Discharge status: Home / Self Care | Payer: Medicare Other | Source: Ambulatory Visit | Attending: Oncology | Admitting: Oncology

## 2014-08-10 ENCOUNTER — Ambulatory Visit: Payer: Medicare Other

## 2014-08-10 ENCOUNTER — Encounter (HOSPITAL_COMMUNITY): Payer: Self-pay

## 2014-08-10 DIAGNOSIS — C61 Malignant neoplasm of prostate: Secondary | ICD-10-CM | POA: Diagnosis present

## 2014-08-10 HISTORY — DX: Malignant (primary) neoplasm, unspecified: C80.1

## 2014-08-10 MED ORDER — IOHEXOL 300 MG/ML  SOLN: 80.0000 mL | Freq: Once | INTRAMUSCULAR | Status: DC | PRN

## 2014-08-10 MED ORDER — IOHEXOL 300 MG/ML  SOLN
100.0000 mL | Freq: Once | INTRAMUSCULAR | Status: AC | PRN
  Administered 2014-08-10: 100 mL via INTRAVENOUS

## 2014-08-14 ENCOUNTER — Other Ambulatory Visit (HOSPITAL_BASED_OUTPATIENT_CLINIC_OR_DEPARTMENT_OTHER): Payer: Medicare Other

## 2014-08-14 ENCOUNTER — Other Ambulatory Visit: Payer: Self-pay | Admitting: Oncology

## 2014-08-14 ENCOUNTER — Ambulatory Visit: Payer: Medicare Other

## 2014-08-14 ENCOUNTER — Telehealth: Payer: Self-pay | Admitting: Oncology

## 2014-08-14 ENCOUNTER — Ambulatory Visit (HOSPITAL_BASED_OUTPATIENT_CLINIC_OR_DEPARTMENT_OTHER): Payer: Medicare Other

## 2014-08-14 ENCOUNTER — Ambulatory Visit (HOSPITAL_BASED_OUTPATIENT_CLINIC_OR_DEPARTMENT_OTHER): Payer: Medicare Other | Admitting: Nurse Practitioner

## 2014-08-14 ENCOUNTER — Other Ambulatory Visit: Payer: Self-pay | Admitting: *Deleted

## 2014-08-14 VITALS — BP 135/79 | HR 83 | Temp 98.5°F | Resp 18 | Ht 76.0 in | Wt 187.4 lb

## 2014-08-14 DIAGNOSIS — C7931 Secondary malignant neoplasm of brain: Secondary | ICD-10-CM

## 2014-08-14 DIAGNOSIS — C61 Malignant neoplasm of prostate: Secondary | ICD-10-CM | POA: Diagnosis not present

## 2014-08-14 DIAGNOSIS — R269 Unspecified abnormalities of gait and mobility: Secondary | ICD-10-CM

## 2014-08-14 DIAGNOSIS — Z5111 Encounter for antineoplastic chemotherapy: Secondary | ICD-10-CM

## 2014-08-14 LAB — CBC WITH DIFFERENTIAL/PLATELET
BASO%: 1.1 % (ref 0.0–2.0)
Basophils Absolute: 0.1 10*3/uL (ref 0.0–0.1)
EOS%: 0.2 % (ref 0.0–7.0)
Eosinophils Absolute: 0 10*3/uL (ref 0.0–0.5)
HCT: 35.2 % — ABNORMAL LOW (ref 38.4–49.9)
HEMOGLOBIN: 10.9 g/dL — AB (ref 13.0–17.1)
LYMPH#: 0.8 10*3/uL — AB (ref 0.9–3.3)
LYMPH%: 16.1 % (ref 14.0–49.0)
MCH: 27.1 pg — ABNORMAL LOW (ref 27.2–33.4)
MCHC: 31.1 g/dL — ABNORMAL LOW (ref 32.0–36.0)
MCV: 87.2 fL (ref 79.3–98.0)
MONO#: 0.7 10*3/uL (ref 0.1–0.9)
MONO%: 13.4 % (ref 0.0–14.0)
NEUT#: 3.5 10*3/uL (ref 1.5–6.5)
NEUT%: 69.2 % (ref 39.0–75.0)
Platelets: 324 10*3/uL (ref 140–400)
RBC: 4.04 10*6/uL — ABNORMAL LOW (ref 4.20–5.82)
RDW: 19 % — AB (ref 11.0–14.6)
WBC: 5.1 10*3/uL (ref 4.0–10.3)

## 2014-08-14 LAB — COMPREHENSIVE METABOLIC PANEL (CC13)
ALT: 7 U/L (ref 0–55)
AST: 11 U/L (ref 5–34)
Albumin: 2.8 g/dL — ABNORMAL LOW (ref 3.5–5.0)
Alkaline Phosphatase: 74 U/L (ref 40–150)
Anion Gap: 10 mEq/L (ref 3–11)
BILIRUBIN TOTAL: 0.34 mg/dL (ref 0.20–1.20)
BUN: 10.3 mg/dL (ref 7.0–26.0)
CHLORIDE: 107 meq/L (ref 98–109)
CO2: 22 mEq/L (ref 22–29)
CREATININE: 0.9 mg/dL (ref 0.7–1.3)
Calcium: 8.8 mg/dL (ref 8.4–10.4)
EGFR: 87 mL/min/{1.73_m2} — ABNORMAL LOW (ref >90)
Glucose: 256 mg/dl — ABNORMAL HIGH (ref 70–140)
Potassium: 4.1 mEq/L (ref 3.5–5.1)
Sodium: 139 mEq/L (ref 136–145)
TOTAL PROTEIN: 5.5 g/dL — AB (ref 6.4–8.3)

## 2014-08-14 MED ORDER — DEXAMETHASONE SODIUM PHOSPHATE 100 MG/10ML IJ SOLN
Freq: Once | INTRAMUSCULAR | Status: AC
  Administered 2014-08-14: 13:00:00 via INTRAVENOUS
  Filled 2014-08-14: qty 8

## 2014-08-14 MED ORDER — SODIUM CHLORIDE 0.9 % IV SOLN
Freq: Once | INTRAVENOUS | Status: AC
  Administered 2014-08-14: 13:00:00 via INTRAVENOUS

## 2014-08-14 MED ORDER — SODIUM CHLORIDE 0.9 % IV SOLN
570.0000 mg | Freq: Once | INTRAVENOUS | Status: AC
  Administered 2014-08-14: 570 mg via INTRAVENOUS
  Filled 2014-08-14: qty 57

## 2014-08-14 MED ORDER — SODIUM CHLORIDE 0.9 % IV SOLN
100.0000 mg/m2 | Freq: Once | INTRAVENOUS | Status: AC
  Administered 2014-08-14: 220 mg via INTRAVENOUS
  Filled 2014-08-14: qty 11

## 2014-08-14 NOTE — Progress Notes (Addendum)
[facility] OFFICE PROGRESS NOTE   Diagnosis:  Small cell carcinoma of the prostate  INTERVAL HISTORY:   Mr. Mccord returns as scheduled. He completed cycle 3 carboplatin/etoposide beginning 07/17/2014. He feels well. No nausea or vomiting. No mouth sores. He continues to have problems with constipation. He is currently taking Miralax and a stool softener. He reports a good appetite. Energy level is better. Left-sided strength is improving. He continues outpatient physical therapy. He continues to have decreased hearing of the right ear.  Objective:  Vital signs in last 24 hours:  Blood pressure 135/79, pulse 83, temperature 98.5 F (36.9 C), temperature source Oral, resp. rate 18, height 6\' 4"  (1.93 m), weight 187 lb 6.4 oz (85.004 kg), SpO2 100 %.    HEENT: No thrush or ulcers. Right ear canal with cerumen. Unable to visualize the tympanic membrane. Lymphatics: No palpable cervical or supra-clavicular lymph nodes. Resp: Lungs clear bilaterally. Cardio: Regular rate and rhythm. GI: Abdomen soft and nontender. No hepatomegaly. No mass. Vascular: No leg edema. Neuro: Minimal weakness proximal left leg. Strength otherwise intact in the upper and lower extremities.    Lab Results:  Lab Results  Component Value Date   WBC 5.1 08/14/2014   HGB 10.9* 08/14/2014   HCT 35.2* 08/14/2014   MCV 87.2 08/14/2014   PLT 324 08/14/2014   NEUTROABS 3.5 08/14/2014    Medications: I have reviewed the patient's current medications.  Assessment/Plan: 1. 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, status post resection 04/06/2014  Staging CTs of the chest, abdomen, and pelvis 04/12/2014 confirmed a prostate mass  Prostate ultrasound 04/16/2014 confirmed a prostate mass with a biopsy consistent with small cell carcinoma  Extrinsic appearing cecal mass confirmed on colonoscopy 04/19/2014 with a biopsy revealing small cell carcinoma  Whole brain and  prostate radiation 04/24/2014 through 05/16/2014  Cycle 1 carboplatin/etoposide beginning 05/22/2014 with Neulasta support  Cycle 2 carboplatin/etoposide 06/19/2014  Cycle 3 carboplatin/etoposide 07/17/2014  Restaging CT scans 08/10/2014 with near-complete resolution of the large mass involving the prostate gland; resolution of inferior perirectal and right ileocolonic mesenteric lymph nodes; progressive right internal iliac/presacral adenopathy and new small bilateral adrenal nodules; persistent soft tissue nodule involving the medial wall of the cecum. 2. Left frontal meningioma, WHO grade 1, status post resection 04/06/2014 3. Left leg weakness secondary to #1 4. 1 cmbrain lesion at the inferior falx noted on the MRI 03/21/2014, not resected 5. Diabetes  6. Hypertension  7. Hyperlipidemia 8. Neutropenia following cycle 1 carboplatin/etoposide.   Disposition: Mr. Worthing appears stable. Dr. Benay Spice reviewed the recent CT result with him and his wife. They understand the scan shows a mixed response. We also discussed the approximate 5 week time lapse between the first scan and initiation of chemotherapy. Due to the significant improvement in the large mass involving the prostate gland and his improved clinical status Dr. Benay Spice recommends continuation of carboplatin/etoposide for an additional 2 cycles prior to the next restaging CT evaluation.  Plan to proceed with cycle 4 carboplatin/etoposide today as scheduled. He will again receive Neulasta support.  He will return for a follow-up visit and cycle 5 carboplatin/etoposide in 4 weeks. He will contact the office in the interim with any problems.  Patient seen with Dr. Benay Spice. 25 minutes were spent face-to-face at today's visit with the majority of that time involved in counseling/coordination of care.    Ned Card ANP/GNP-BC   08/14/2014  11:57 AM  This was a shared visit with  Ned Card. We reviewed the CT findings with  Mr. Stormont and his wife. The prostate mass has improved significantly. There was a significant interval between the baseline CT and the initiation of chemotherapy. This could explain the adrenal nodules and progressive lymphadenopathy. We decided to administer 2 more cycles of etoposide/carboplatin prior to another CT. He agrees to proceed.  [name], M.D.

## 2014-08-14 NOTE — Therapy (Signed)
[facility] [address] [place], [place], [address] Phone: [phone]   Fax:  [phone]  Physical Therapy Treatment  Patient Details  Name: Ivan Stark MRN: [id number] Date of Birth: 1945-07-02 Referring Provider:  Unk Pinto, MD  Encounter Date: 08/14/2014      PT End of Session - 08/14/14 1306    Visit Number 8   Number of Visits 17   Date for PT Re-Evaluation 09/09/14   Authorization Type G-code every 10th visit.   PT Start Time 0932   PT Stop Time 1013   PT Time Calculation (min) 41 min   Equipment Utilized During Treatment Gait belt   Activity Tolerance Patient tolerated treatment well   Behavior During Therapy WFL for tasks assessed/performed      Past Medical History  Diagnosis Date  . Hypertension   . Hyperlipidemia   . GERD (gastroesophageal reflux disease)   . Vitamin D deficiency   . History of colon polyps   . Complication of anesthesia     Difficult to wake up; and severe nausea and vomiting  . PONV (postoperative nausea and vomiting)   . Diabetes mellitus without complication     metformin  . Cancer 03/2014    prostate ca    Past Surgical History  Procedure Laterality Date  . Tonsillectomy    . Colonoscopy w/ polypectomy    . Esophagus surgery      Had esophagus stretched due to food getting trapped in his throat  . Craniotomy Bilateral 04/06/2014    Procedure: Bilateral Fronto-parietal craniotomy for resection of tumors with Curve;  Surgeon: Consuella Lose, MD;  Location: [facility];  Service: Neurosurgery;  Laterality: Bilateral;  Bilateral Fronto-parietal craniotomy for resection of tumors with Curve  . Colonoscopy N/A 04/19/2014    Procedure: COLONOSCOPY;  Surgeon: Winfield Cunas., MD;  Location: [facility] ENDOSCOPY;  Service: Endoscopy;  Laterality: N/A;    There were no vitals taken for this visit.  Visit Diagnosis:  Abnormality of gait      Subjective  Assessment - 08/14/14 0935    Symptoms Pt denied falls or changes since last visit. Pt reported he went to the park with his grandchildren this weekend. Pt performed supine HEp, but not balance HEP.   Pertinent History Active prostate cancer with brain metastasis   Patient Stated Goals Walk without RW   Currently in Pain? No/denies                    [facility] Adult PT Treatment/Exercise - 08/14/14 0937    Ambulation/Gait   Ambulation/Gait Yes   Ambulation/Gait Assistance 4: Min guard   Ambulation/Gait Assistance Details Pt ambulated over even/uneven terrain with 2 LOB episodes while ambulating outdoors which required min A to maintain balance. Pt ambulated over even terrain while performing head turns, with one LOB, which required pt to stop in order to maintain balance. VC's to improve stride length, L heel strike, and upright posture. Pt required one seated rest break after amb. 2/2 L LE fatigue.   Ambulation Distance (Feet) --  750', 125', 15'x2, 117'   Assistive device Straight cane   Gait Pattern Step-through pattern;Decreased dorsiflexion - left;Left steppage;Right flexed knee in stance;Left flexed knee in stance;Decreased trunk rotation   Ambulation Surface Level;Indoor   Ramp Other (comment)  min guard. VC's for technique.   Curb Other (comment)  min guard. Cues and demonstration for sequencing.   Balance   Balance Assessed Yes  Dynamic Standing Balance   Dynamic Standing - Balance Support No upper extremity supported   Dynamic Standing - Level of Assistance 4: Min assist;Other (comment)  progressed to min guard   Dynamic Standing - Balance Activities Lateral lean/weight shifting;Other (comment)   Dynamic Standing - Comments B LE alternating toe taps on step with 0-1 UE support x10/LE, pt required min A to maintain balance without UE support. B LEs on non-compliant surface: single cone taps 2x4cones/LE/activity, pt progressed from min A to min guard to maintain balance.  VC's to improve weight shifting.                 PT Education - 08/14/14 1306    Education provided Yes   Education Details Reiterated the importance of performing HEP as prescribed.   Person(s) Educated Patient   Methods Explanation   Comprehension Verbalized understanding          PT Short Term Goals - 08/08/14 0953    PT SHORT TERM GOAL #1   Title Pt will be independent in HEP to improve functional mobility. Target date: 08/08/14.   Status Partially Met   PT SHORT TERM GOAL #2   Title Asses BERG and write appropriate STG and LTG. Target date: 08/08/14.   Status Achieved   PT SHORT TERM GOAL #3   Title Pt will ambulated 300', with LRAD, over even/uneven terrain with supervision and no LOB to improve functional mobillity. Target date: 08/08/14.   Status Achieved   PT SHORT TERM GOAL #4   Title Pt will report zero falls in the last 4 weeks to improve safety during functional mobility. Target date: 08/08/14.   Baseline No falls in last 4 weeks.   Status Achieved   PT SHORT TERM GOAL #5   Title Pt will improve BERG balance score to >/=41/56 to decrease falls risk. Target date: 08/08/14.   Status Achieved           PT Long Term Goals - 08/03/14 1628    PT LONG TERM GOAL #1   Title Pt will verbalize understanding of falls prevention strategies to decrease falls risk. Target date: 09/05/14.   Status On-going   PT LONG TERM GOAL #2   Title Pt will ambulate 600', with LRAD, over even/uneven terrain at MOD I level in order to improve functional mobility at home and at work. Target date: 09/05/14.   Status On-going   PT LONG TERM GOAL #3   Title Have pt complete FOTO and write appropriate goal. Target date: 09/05/14.   Status Achieved   PT LONG TERM GOAL #4   Title Pt will perform sit<>stand x10 with no UE support to improve safety during functional mobility. Target date: 09/05/14.   Status On-going   PT LONG TERM GOAL #5   Title Pt will improve gait speed, with LRAD, to  >/=2.58f/sec. to ambulate safely in the community. Target date: 09/05/14.   Status On-going   PT LONG TERM GOAL #6   Title Pt will improve BERG balance score to >/=45/56 to decrease falls risk. Target date: 09/05/14.   Status On-going   PT LONG TERM GOAL #7   Title Pt will improve ABC score by 10% to improve quality of life. Target date: 09/05/14.   Status On-going               Plan - 08/14/14 1307    Clinical Impression Statement Pt demonstrated progress as she was able to amb. outdoors over uneven terrain with SPC.  Pt was able to amb. longer distances with less rest breaks today. Continues to require cues to improve L heel strike and stride length 2/2 L LE weakness. Continue with POC.   Pt will benefit from skilled therapeutic intervention in order to improve on the following deficits Abnormal gait;Impaired flexibility;Decreased range of motion;Decreased endurance;Decreased knowledge of use of DME;Decreased balance;Decreased mobility;Decreased strength   Rehab Potential Good   PT Frequency 2x / week   PT Duration 8 weeks   PT Treatment/Interventions ADLs/Self Care Home Management;Gait training;Neuromuscular re-education;Biofeedback;Functional mobility training;Patient/family education;Therapeutic activities;Therapeutic exercise;Manual techniques;Balance training;DME Instruction   PT Next Visit Plan Dynamic gait training with SPC and balance training.    PT Home Exercise Plan Balance and strength HEP.    Consulted and Agree with Plan of Care Patient        Problem List Patient Active Problem List   Diagnosis Date Noted  . Petechial rash 06/05/2014  . Hypotension 05/22/2014  . Immobility 05/22/2014  . Anorexia 05/22/2014  . Dehydration 05/22/2014  . Hypoalbuminemia 05/22/2014  . Desquamated skin 05/22/2014  . Small cell carcinoma of prostate   . Vision disturbance   . Cerebral meningioma   . Left flaccid hemiparesis   . Left hemiparesis 04/09/2014  . Brain metastases  04/06/2014  . Poor compliance with F/U 02/04/2014  . CKD stage 1 due to type 2 diabetes mellitus 08/20/2013  . Encounter for long-term (current) use of other medications 08/20/2013  . Hypertension   . Hyperlipidemia   . GERD (gastroesophageal reflux disease)   . Vitamin D deficiency   . History of colon polyps     , L 08/14/2014, 1:09 PM  [facility] [address] [place], [place], [address] Phone: [phone]   Fax:  [phone]     Geoffry Paradise, PT,DPT 08/14/2014 1:09 PM Phone: [phone] Fax: [phone]

## 2014-08-14 NOTE — Telephone Encounter (Signed)
gv adn printed appt sched and avs for pt for March and April....sed added tx.    °

## 2014-08-14 NOTE — Patient Instructions (Signed)
[facility] Discharge Instructions for Patients Receiving Chemotherapy  Today you received the following chemotherapy agents carboplatin/etoposide.    To help prevent nausea and vomiting after your treatment, we encourage you to take your nausea medication as directed.    If you develop nausea and vomiting that is not controlled by your nausea medication, call the clinic.   BELOW ARE SYMPTOMS THAT SHOULD BE REPORTED IMMEDIATELY:  *FEVER GREATER THAN 100.5 F  *CHILLS WITH OR WITHOUT FEVER  NAUSEA AND VOMITING THAT IS NOT CONTROLLED WITH YOUR NAUSEA MEDICATION  *UNUSUAL SHORTNESS OF BREATH  *UNUSUAL BRUISING OR BLEEDING  TENDERNESS IN MOUTH AND THROAT WITH OR WITHOUT PRESENCE OF ULCERS  *URINARY PROBLEMS  *BOWEL PROBLEMS  UNUSUAL RASH Items with * indicate a potential emergency and should be followed up as soon as possible.  Feel free to call the clinic you have any questions or concerns. The clinic phone number is [phone].  

## 2014-08-14 NOTE — Patient Instructions (Signed)
Continue Miralax daily Begin Senokot S 2 tabs twice a day

## 2014-08-15 ENCOUNTER — Ambulatory Visit (HOSPITAL_BASED_OUTPATIENT_CLINIC_OR_DEPARTMENT_OTHER): Payer: Medicare Other

## 2014-08-15 DIAGNOSIS — Z5111 Encounter for antineoplastic chemotherapy: Secondary | ICD-10-CM

## 2014-08-15 DIAGNOSIS — C61 Malignant neoplasm of prostate: Secondary | ICD-10-CM

## 2014-08-15 DIAGNOSIS — C7A1 Malignant poorly differentiated neuroendocrine tumors: Secondary | ICD-10-CM

## 2014-08-15 DIAGNOSIS — C7B8 Other secondary neuroendocrine tumors: Secondary | ICD-10-CM

## 2014-08-15 MED ORDER — ONDANSETRON HCL 40 MG/20ML IJ SOLN
Freq: Once | INTRAMUSCULAR | Status: AC
  Administered 2014-08-15: 11:00:00 via INTRAVENOUS
  Filled 2014-08-15: qty 4

## 2014-08-15 MED ORDER — SODIUM CHLORIDE 0.9 % IV SOLN
Freq: Once | INTRAVENOUS | Status: AC
  Administered 2014-08-15: 11:00:00 via INTRAVENOUS

## 2014-08-15 MED ORDER — SODIUM CHLORIDE 0.9 % IV SOLN
100.0000 mg/m2 | Freq: Once | INTRAVENOUS | Status: AC
  Administered 2014-08-15: 220 mg via INTRAVENOUS
  Filled 2014-08-15: qty 11

## 2014-08-15 NOTE — Patient Instructions (Signed)
[facility] Discharge Instructions for Patients Receiving Chemotherapy  Today you received the following chemotherapy agents etoposide   To help prevent nausea and vomiting after your treatment, we encourage you to take your nausea medication as directed  If you develop nausea and vomiting that is not controlled by your nausea medication, call the clinic.   BELOW ARE SYMPTOMS THAT SHOULD BE REPORTED IMMEDIATELY:  *FEVER GREATER THAN 100.5 F  *CHILLS WITH OR WITHOUT FEVER  NAUSEA AND VOMITING THAT IS NOT CONTROLLED WITH YOUR NAUSEA MEDICATION  *UNUSUAL SHORTNESS OF BREATH  *UNUSUAL BRUISING OR BLEEDING  TENDERNESS IN MOUTH AND THROAT WITH OR WITHOUT PRESENCE OF ULCERS  *URINARY PROBLEMS  *BOWEL PROBLEMS  UNUSUAL RASH Items with * indicate a potential emergency and should be followed up as soon as possible.  Feel free to call the clinic you have any questions or concerns. The clinic phone number is [phone].  

## 2014-08-16 ENCOUNTER — Ambulatory Visit (HOSPITAL_BASED_OUTPATIENT_CLINIC_OR_DEPARTMENT_OTHER): Payer: Medicare Other

## 2014-08-16 DIAGNOSIS — C7931 Secondary malignant neoplasm of brain: Secondary | ICD-10-CM

## 2014-08-16 DIAGNOSIS — Z5111 Encounter for antineoplastic chemotherapy: Secondary | ICD-10-CM

## 2014-08-16 DIAGNOSIS — C61 Malignant neoplasm of prostate: Secondary | ICD-10-CM

## 2014-08-16 MED ORDER — SODIUM CHLORIDE 0.9 % IV SOLN
Freq: Once | INTRAVENOUS | Status: AC
  Administered 2014-08-16: 11:00:00 via INTRAVENOUS

## 2014-08-16 MED ORDER — SODIUM CHLORIDE 0.9 % IV SOLN
Freq: Once | INTRAVENOUS | Status: AC
  Administered 2014-08-16: 11:00:00 via INTRAVENOUS
  Filled 2014-08-16: qty 4

## 2014-08-16 MED ORDER — SODIUM CHLORIDE 0.9 % IV SOLN
100.0000 mg/m2 | Freq: Once | INTRAVENOUS | Status: AC
  Administered 2014-08-16: 220 mg via INTRAVENOUS
  Filled 2014-08-16: qty 11

## 2014-08-16 NOTE — Patient Instructions (Signed)
[facility] Discharge Instructions for Patients Receiving Chemotherapy  Today you received the following chemotherapy agents: Etoposide.  To help prevent nausea and vomiting after your treatment, we encourage you to take your nausea medication: Compazine 10 mg every 6 hours as needed.   If you develop nausea and vomiting that is not controlled by your nausea medication, call the clinic.   BELOW ARE SYMPTOMS THAT SHOULD BE REPORTED IMMEDIATELY:  *FEVER GREATER THAN 100.5 F  *CHILLS WITH OR WITHOUT FEVER  NAUSEA AND VOMITING THAT IS NOT CONTROLLED WITH YOUR NAUSEA MEDICATION  *UNUSUAL SHORTNESS OF BREATH  *UNUSUAL BRUISING OR BLEEDING  TENDERNESS IN MOUTH AND THROAT WITH OR WITHOUT PRESENCE OF ULCERS  *URINARY PROBLEMS  *BOWEL PROBLEMS  UNUSUAL RASH Items with * indicate a potential emergency and should be followed up as soon as possible.  Feel free to call the clinic you have any questions or concerns. The clinic phone number is [phone].    

## 2014-08-17 ENCOUNTER — Ambulatory Visit (HOSPITAL_BASED_OUTPATIENT_CLINIC_OR_DEPARTMENT_OTHER): Payer: Medicare Other

## 2014-08-17 DIAGNOSIS — C61 Malignant neoplasm of prostate: Secondary | ICD-10-CM

## 2014-08-17 DIAGNOSIS — Z5189 Encounter for other specified aftercare: Secondary | ICD-10-CM

## 2014-08-17 MED ORDER — PEGFILGRASTIM INJECTION 6 MG/0.6ML ~~LOC~~
6.0000 mg | PREFILLED_SYRINGE | Freq: Once | SUBCUTANEOUS | Status: AC
  Administered 2014-08-17: 6 mg via SUBCUTANEOUS
  Filled 2014-08-17: qty 0.6

## 2014-08-21 ENCOUNTER — Ambulatory Visit: Payer: Medicare Other

## 2014-08-21 DIAGNOSIS — R269 Unspecified abnormalities of gait and mobility: Secondary | ICD-10-CM

## 2014-08-21 DIAGNOSIS — C61 Malignant neoplasm of prostate: Secondary | ICD-10-CM | POA: Diagnosis not present

## 2014-08-21 NOTE — Therapy (Signed)
[facility] [address] [place], [place], [address] Phone: [phone]   Fax:  [phone]  Physical Therapy Treatment  Patient Details  Name: Ivan Stark MRN: [id number] Date of Birth: 03/04/1946 Referring Provider:  Unk Pinto, MD  Encounter Date: 08/21/2014      PT End of Session - 08/21/14 1305    Visit Number 9   Number of Visits 17   Date for PT Re-Evaluation 09/09/14   Authorization Type G-code every 10th visit.   PT Start Time 1146   PT Stop Time 1229   PT Time Calculation (min) 43 min   Equipment Utilized During Treatment Gait belt   Activity Tolerance Patient tolerated treatment well   Behavior During Therapy WFL for tasks assessed/performed      Past Medical History  Diagnosis Date  . Hypertension   . Hyperlipidemia   . GERD (gastroesophageal reflux disease)   . Vitamin D deficiency   . History of colon polyps   . Complication of anesthesia     Difficult to wake up; and severe nausea and vomiting  . PONV (postoperative nausea and vomiting)   . Diabetes mellitus without complication     metformin  . Cancer 03/2014    prostate ca    Past Surgical History  Procedure Laterality Date  . Tonsillectomy    . Colonoscopy w/ polypectomy    . Esophagus surgery      Had esophagus stretched due to food getting trapped in his throat  . Craniotomy Bilateral 04/06/2014    Procedure: Bilateral Fronto-parietal craniotomy for resection of tumors with Curve;  Surgeon: Consuella Lose, MD;  Location: [facility];  Service: Neurosurgery;  Laterality: Bilateral;  Bilateral Fronto-parietal craniotomy for resection of tumors with Curve  . Colonoscopy N/A 04/19/2014    Procedure: COLONOSCOPY;  Surgeon: Winfield Cunas., MD;  Location: [facility];  Service: Endoscopy;  Laterality: N/A;    There were no vitals filed for this visit.  Visit Diagnosis:  Abnormality of gait      Subjective  Assessment - 08/21/14 1149    Symptoms Pt denied falls since last visit. Pt reported he had chemo treatment last week and it caused increased symptoms of N/V, and B LE pain. All symptoms are now subsiding. Pt reported the pain made it difficult to amb., he reported his legs felt like they were asleep. He did not inform his MD.  Pt reported he has been performing some of his HEP.   Pertinent History Active prostate cancer with brain metastasis   Patient Stated Goals Walk without RW   Currently in Pain? No/denies                       [facility] Adult PT Treatment/Exercise - 08/21/14 0001    Ambulation/Gait   Ambulation/Gait Yes   Ambulation/Gait Assistance 4: Min guard 5 Supervision 3 Min Assist   Ambulation/Gait Assistance Details Pt ambulated over even/uneven terrain with SPC while performing head turns, pt required min A to maintain balance outdoors during 2 LOB episodes while performing vertical head turns. Pt was able to self correct one LOB while amb. outdoors. VC's to improve L heel strike, stride length, and upright posture. Pt ambulated over compliant surface with SPC: backwards, lateral and forward amb. (4x7'/activity), VC's and demonstration to weight shifting, improve stride length and upright posture.   Ambulation Distance (Feet) --  550' outdoors and 230'x2 with SPC, 52' with RW  Assistive device Straight cane   Gait Pattern Step-through pattern;Decreased dorsiflexion - left;Left steppage;Right flexed knee in stance;Left flexed knee in stance;Decreased trunk rotation;Decreased stride length   Ambulation Surface Level;Unlevel;Indoor;Outdoor                PT Education - 08/21/14 1305    Education provided Yes   Education Details PT encouraged pt to inform MD of chemo side effects   Person(s) Educated Patient   Methods Explanation   Comprehension Verbalized understanding          PT Short Term Goals - 08/08/14 0953    PT SHORT TERM GOAL #1   Title Pt  will be independent in HEP to improve functional mobility. Target date: 08/08/14.   Status Partially Met   PT SHORT TERM GOAL #2   Title Asses BERG and write appropriate STG and LTG. Target date: 08/08/14.   Status Achieved   PT SHORT TERM GOAL #3   Title Pt will ambulated 300', with LRAD, over even/uneven terrain with supervision and no LOB to improve functional mobillity. Target date: 08/08/14.   Status Achieved   PT SHORT TERM GOAL #4   Title Pt will report zero falls in the last 4 weeks to improve safety during functional mobility. Target date: 08/08/14.   Baseline No falls in last 4 weeks.   Status Achieved   PT SHORT TERM GOAL #5   Title Pt will improve BERG balance score to >/=41/56 to decrease falls risk. Target date: 08/08/14.   Status Achieved           PT Long Term Goals - 08/03/14 1628    PT LONG TERM GOAL #1   Title Pt will verbalize understanding of falls prevention strategies to decrease falls risk. Target date: 09/05/14.   Status On-going   PT LONG TERM GOAL #2   Title Pt will ambulate 600', with LRAD, over even/uneven terrain at MOD I level in order to improve functional mobility at home and at work. Target date: 09/05/14.   Status On-going   PT LONG TERM GOAL #3   Title Have pt complete FOTO and write appropriate goal. Target date: 09/05/14.   Status Achieved   PT LONG TERM GOAL #4   Title Pt will perform sit<>stand x10 with no UE support to improve safety during functional mobility. Target date: 09/05/14.   Status On-going   PT LONG TERM GOAL #5   Title Pt will improve gait speed, with LRAD, to >/=2.66f/sec. to ambulate safely in the community. Target date: 09/05/14.   Status On-going   PT LONG TERM GOAL #6   Title Pt will improve BERG balance score to >/=45/56 to decrease falls risk. Target date: 09/05/14.   Status On-going   PT LONG TERM GOAL #7   Title Pt will improve ABC score by 10% to improve quality of life. Target date: 09/05/14.   Status On-going                Plan - 08/21/14 1305    Clinical Impression Statement Pt demonstrated progress as he was able to progress to supervision while ambulating indoors with SPC, however, pt required min guard-min A during amb. outdoors due to LOB. Pt required seated rest breaks during session 2/2 fatigue, most likely due to side effects of chemo. Continue with POC.   Pt will benefit from skilled therapeutic intervention in order to improve on the following deficits Abnormal gait;Impaired flexibility;Decreased range of motion;Decreased endurance;Decreased knowledge of use of DME;Decreased balance;Decreased  mobility;Decreased strength   Rehab Potential Good   PT Frequency 2x / week   PT Duration 8 weeks   PT Treatment/Interventions ADLs/Self Care Home Management;Gait training;Neuromuscular re-education;Biofeedback;Functional mobility training;Patient/family education;Therapeutic activities;Therapeutic exercise;Manual techniques;Balance training;DME Instruction   PT Next Visit Plan G-CODE. Dynamic gait training with SPC and balance training over uneven terrain and compliant surfaces.   PT Home Exercise Plan Balance and strength HEP.    Consulted and Agree with Plan of Care Patient        Problem List Patient Active Problem List   Diagnosis Date Noted  . Petechial rash 06/05/2014  . Hypotension 05/22/2014  . Immobility 05/22/2014  . Anorexia 05/22/2014  . Dehydration 05/22/2014  . Hypoalbuminemia 05/22/2014  . Desquamated skin 05/22/2014  . Small cell carcinoma of prostate   . Vision disturbance   . Cerebral meningioma   . Left flaccid hemiparesis   . Left hemiparesis 04/09/2014  . Brain metastases 04/06/2014  . Poor compliance with F/U 02/04/2014  . CKD stage 1 due to type 2 diabetes mellitus 08/20/2013  . Encounter for long-term (current) use of other medications 08/20/2013  . Hypertension   . Hyperlipidemia   . GERD (gastroesophageal reflux disease)   . Vitamin D deficiency   .  History of colon polyps     Miller,Jennifer L 08/21/2014, 1:07 PM  [facility] [address] [place], [place], [address] Phone: [phone]   Fax:  [phone]     Geoffry Paradise, PT,DPT 08/21/2014 1:07 PM Phone: [phone] Fax: [phone]

## 2014-08-22 ENCOUNTER — Encounter: Payer: Self-pay | Admitting: Internal Medicine

## 2014-08-24 ENCOUNTER — Telehealth: Payer: Self-pay | Admitting: *Deleted

## 2014-08-24 ENCOUNTER — Ambulatory Visit (HOSPITAL_BASED_OUTPATIENT_CLINIC_OR_DEPARTMENT_OTHER): Payer: Medicare Other | Admitting: Nurse Practitioner

## 2014-08-24 ENCOUNTER — Telehealth: Payer: Self-pay | Admitting: Nurse Practitioner

## 2014-08-24 ENCOUNTER — Ambulatory Visit: Payer: Medicare Other

## 2014-08-24 VITALS — BP 125/69 | HR 90 | Temp 97.6°F

## 2014-08-24 DIAGNOSIS — R531 Weakness: Secondary | ICD-10-CM

## 2014-08-24 DIAGNOSIS — C7931 Secondary malignant neoplasm of brain: Secondary | ICD-10-CM | POA: Diagnosis not present

## 2014-08-24 DIAGNOSIS — W19XXXA Unspecified fall, initial encounter: Secondary | ICD-10-CM | POA: Diagnosis not present

## 2014-08-24 DIAGNOSIS — C61 Malignant neoplasm of prostate: Secondary | ICD-10-CM | POA: Diagnosis not present

## 2014-08-24 MED ORDER — HYDROCODONE-ACETAMINOPHEN 5-325 MG PO TABS: 1.0000 | ORAL_TABLET | Freq: Four times a day (QID) | ORAL | Status: DC | PRN

## 2014-08-24 NOTE — Telephone Encounter (Signed)
Patient called to report losing balance and falling against the wall on Tuesday 08/21/14.  He had no adverse effects from that event until yesterday when he started having significant pain when he takes a deep breath and when he moves certain ways.  The area is also painful to the touch.  He said he would need to have his wife help him get out of bed the pain is so significant. He has Vicodin 5/325 # 6 on hand.  He reports the pain as 5-6/10.  Please advise.

## 2014-08-24 NOTE — Telephone Encounter (Signed)
per pof to add to [facility]-pt aware

## 2014-08-24 NOTE — Telephone Encounter (Signed)
Reviewed earlier note with Dr. Benay Spice. Called pt with appointment to be seen in symptom management clinic to be evaluated for pleuritic pain and chest tenderness/ pain after recent fall.

## 2014-08-27 ENCOUNTER — Encounter: Payer: Self-pay | Admitting: Nurse Practitioner

## 2014-08-27 NOTE — Assessment & Plan Note (Signed)
Patient received cycle 4 of his carboplatin/etoposide chemotherapy regimen on 08/14/2014.  He plans to return for his next cycle of the same regimen on 09/11/2014.

## 2014-08-27 NOTE — Progress Notes (Signed)
SYMPTOM MANAGEMENT CLINIC   HPI: Ivan Stark 69 y.o. male diagnosed with prostate cancer; with brain metastasis.  Currently undergoing carboplatin/etoposide chemotherapy regimen.  Patient with history of chronic left-sided weakness.  Patient states that he fell against a wall; injuring his left side earlier this week.  He denies any loss of consciousness or hitting his head.  He also denies any other known injury or trauma.  He denies any chest pain, chest pressure, shortness breath, or pain with inspiration.   HPI  ROS  Past Medical History  Diagnosis Date  . Hypertension   . Hyperlipidemia   . GERD (gastroesophageal reflux disease)   . Vitamin D deficiency   . History of colon polyps   . Complication of anesthesia     Difficult to wake up; and severe nausea and vomiting  . PONV (postoperative nausea and vomiting)   . Diabetes mellitus without complication     metformin  . Cancer 03/2014    prostate ca    Past Surgical History  Procedure Laterality Date  . Tonsillectomy    . Colonoscopy w/ polypectomy    . Esophagus surgery      Had esophagus stretched due to food getting trapped in his throat  . Craniotomy Bilateral 04/06/2014    Procedure: Bilateral Fronto-parietal craniotomy for resection of tumors with Curve;  Surgeon: Consuella Lose, MD;  Location: [facility];  Service: Neurosurgery;  Laterality: Bilateral;  Bilateral Fronto-parietal craniotomy for resection of tumors with Curve  . Colonoscopy N/A 04/19/2014    Procedure: COLONOSCOPY;  Surgeon: Winfield Cunas., MD;  Location: [facility];  Service: Endoscopy;  Laterality: N/A;    has Hypertension; Hyperlipidemia; GERD (gastroesophageal reflux disease); Vitamin D deficiency; History of colon polyps; CKD stage 1 due to type 2 diabetes mellitus; Encounter for long-term (current) use of other medications; Poor compliance with F/U; Brain metastases; Left hemiparesis; Cerebral meningioma; Left flaccid  hemiparesis; Small cell carcinoma of prostate; Vision disturbance; Hypotension; Immobility; Anorexia; Dehydration; Hypoalbuminemia; Desquamated skin; Petechial rash; and Fall on his problem list.    is allergic to atenolol and lipitor.    Medication List       This list is accurate as of: 08/24/14 11:59 PM.  Always use your most recent med list.               ALPRAZolam 0.5 MG tablet  Commonly known as:  XANAX  Take 1 tablet (0.5 mg total) by mouth 2 (two) times daily as needed for anxiety.     aspirin EC 81 MG tablet  Take 81 mg by mouth daily.     calcium-vitamin D 500-200 MG-UNIT per tablet  Commonly known as:  OSCAL WITH D  Take 1 tablet by mouth 2 (two) times daily.     emollient cream  Commonly known as:  BIAFINE  Apply topically as needed.     feeding supplement (PRO-STAT SUGAR FREE 64) Liqd  Take 30 mLs by mouth 3 (three) times daily with meals.     glimepiride 4 MG tablet  Commonly known as:  AMARYL  Take 0.5 tablets (2 mg total) by mouth 2 (two) times daily.     HYDROcodone-acetaminophen 5-325 MG per tablet  Commonly known as:  NORCO/VICODIN  Take 1-2 tablets by mouth every 6 (six) hours as needed for moderate pain.     metFORMIN 500 MG 24 hr tablet  Commonly known as:  GLUCOPHAGE-XR  Take 1 tablet (500 mg total) by mouth 2 (two)  times daily with breakfast and lunch.     multivitamin with minerals Tabs tablet  Take 1 tablet by mouth daily.     polyethylene glycol packet  Commonly known as:  MIRALAX / GLYCOLAX  Take 60 g by mouth 2 (two) times daily.     prochlorperazine 10 MG tablet  Commonly known as:  COMPAZINE  Take 1 tablet (10 mg total) by mouth every 6 (six) hours as needed for nausea or vomiting.     senna 8.6 MG Tabs tablet  Commonly known as:  SENOKOT  Take 2 tablets (17.2 mg total) by mouth 2 (two) times daily. For constipation. Adjust as  needed     simethicone 80 MG chewable tablet  Commonly known as:  MYLICON  Chew 1 tablet (80 mg  total) by mouth 4 (four) times daily as needed for flatulence.     vitamin C 1000 MG tablet  Take 1,000 mg by mouth daily.     Vitamin D 2000 UNITS Caps  Take 4,000 Units by mouth daily.         PHYSICAL EXAMINATION  Oncology Vitals 08/24/2014 08/17/2014 08/16/2014 08/15/2014 08/14/2014 07/20/2014 07/19/2014  Height - - - - 193 cm - -  Weight - - - - 85.004 kg - -  Weight (lbs) - - - - 187 lbs 6 oz - -  BMI (kg/m2) - - - - 22.81 kg/m2 - -  Temp 97.6 98.6 97.9 97.8 98.5 97.6 97.6  Pulse 90 73 75 74 83 87 78  Resp - - - - 18 - -  SpO2 100 - 100 100 100 - 100  BSA (m2) - - - - 2.13 m2 - -   BP Readings from Last 3 Encounters:  08/24/14 125/69  08/17/14 139/72  08/16/14 136/70    Physical Exam  Constitutional: He is oriented to person, place, and time. Vital signs are normal. He appears unhealthy.  HENT:  Head: Normocephalic and atraumatic.  Mouth/Throat: Oropharynx is clear and moist.  Eyes: Conjunctivae and EOM are normal. Pupils are equal, round, and reactive to light. Right eye exhibits no discharge. Left eye exhibits no discharge. No scleral icterus.  Neck: Normal range of motion. Neck supple. No JVD present. No tracheal deviation present. No thyromegaly present.  Cardiovascular: Normal rate, regular rhythm, normal heart sounds and intact distal pulses.   Pulmonary/Chest: Effort normal and breath sounds normal. No respiratory distress. He has no wheezes. He has no rales. He exhibits tenderness.  Trace tenderness to the left lateral rib area; but no bruising or other obvious injury.  Abdominal: Soft. Bowel sounds are normal. He exhibits no distension and no mass. There is no tenderness. There is no rebound and no guarding.  Musculoskeletal: Normal range of motion. He exhibits no edema or tenderness.  Lymphadenopathy:    He has no cervical adenopathy.  Neurological: He is alert and oriented to person, place, and time.  Skin: Skin is warm and dry. No rash noted. No erythema. No  pallor.  Psychiatric: Affect normal.  Nursing note and vitals reviewed.   LABORATORY DATA:. No visits with results within 3 Day(s) from this visit. Latest known visit with results is:  Appointment on 08/14/2014  Component Date Value Ref Range Status  . WBC 08/14/2014 5.1  4.0 - 10.3 10e3/uL Final  . NEUT# 08/14/2014 3.5  1.5 - 6.5 10e3/uL Final  . HGB 08/14/2014 10.9* 13.0 - 17.1 g/dL Final  . HCT 08/14/2014 35.2* 38.4 - 49.9 % Final  .  Platelets 08/14/2014 324  140 - 400 10e3/uL Final  . MCV 08/14/2014 87.2  79.3 - 98.0 fL Final  . MCH 08/14/2014 27.1* 27.2 - 33.4 pg Final  . MCHC 08/14/2014 31.1* 32.0 - 36.0 g/dL Final  . RBC 08/14/2014 4.04* 4.20 - 5.82 10e6/uL Final  . RDW 08/14/2014 19.0* 11.0 - 14.6 % Final  . lymph# 08/14/2014 0.8* 0.9 - 3.3 10e3/uL Final  . MONO# 08/14/2014 0.7  0.1 - 0.9 10e3/uL Final  . Eosinophils Absolute 08/14/2014 0.0  0.0 - 0.5 10e3/uL Final  . Basophils Absolute 08/14/2014 0.1  0.0 - 0.1 10e3/uL Final  . NEUT% 08/14/2014 69.2  39.0 - 75.0 % Final  . LYMPH% 08/14/2014 16.1  14.0 - 49.0 % Final  . MONO% 08/14/2014 13.4  0.0 - 14.0 % Final  . EOS% 08/14/2014 0.2  0.0 - 7.0 % Final  . BASO% 08/14/2014 1.1  0.0 - 2.0 % Final  . Sodium 08/14/2014 139  136 - 145 mEq/L Final  . Potassium 08/14/2014 4.1  3.5 - 5.1 mEq/L Final  . Chloride 08/14/2014 107  98 - 109 mEq/L Final  . CO2 08/14/2014 22  22 - 29 mEq/L Final  . Glucose 08/14/2014 256* 70 - 140 mg/dl Final  . BUN 08/14/2014 10.3  7.0 - 26.0 mg/dL Final  . Creatinine 08/14/2014 0.9  0.7 - 1.3 mg/dL Final  . Total Bilirubin 08/14/2014 0.34  0.20 - 1.20 mg/dL Final  . Alkaline Phosphatase 08/14/2014 74  40 - 150 U/L Final  . AST 08/14/2014 11  5 - 34 U/L Final  . ALT 08/14/2014 7  0 - 55 U/L Final  . Total Protein 08/14/2014 5.5* 6.4 - 8.3 g/dL Final  . Albumin 08/14/2014 2.8* 3.5 - 5.0 g/dL Final  . Calcium 08/14/2014 8.8  8.4 - 10.4 mg/dL Final  . Anion Gap 08/14/2014 10  3 - 11 mEq/L Final  .  EGFR 08/14/2014 87* >90 ml/min/1.73 m2 Final   eGFR is calculated using the CKD-EPI Creatinine Equation (2009)     RADIOGRAPHIC STUDIES: No results found.  ASSESSMENT/PLAN:    Fall   Patient has a history of chronic left-sided weakness.  He states that he lost his balance and fell against the wall injuring his left lateral rib area earlier this week. He denies  Hitting his head or any other injury/trauma.  He also denies any loss of consciousness.    On exam- patient with very mild tenderness to the left lateral rib area with palpation; but no bruising or other obvious injury. Patient with no chest pain, chest pressure, shortness of breath, or pain with inspiration.   patient has hydrocodone at home; and patient was advised to take the hydrocodone as directed for left lateral rib pain. Advised patient to let us know if his symptoms persist or worsen in any way whatsoever.   Small cell carcinoma of prostate Patient received cycle 4 of his carboplatin/etoposide chemotherapy regimen on 08/14/2014.  He plans to return for his next cycle of the same regimen on 09/11/2014.   Patient stated understanding of all instructions; and was in agreement with this plan of care. The patient knows to call the clinic with any problems, questions or concerns.   Review/collaboration with Dr. Benay Spice regarding all aspects of patient's visit today.   Total time spent with patient was 25 minutes;  with greater than 75 percent of that time spent in face to face counseling regarding patient's symptoms,  and coordination of care and follow  up.  Disclaimer: This note was dictated with voice recognition software. Similar sounding words can inadvertently be transcribed and may not be corrected upon review.   Drue Second, NP 08/27/2014

## 2014-08-27 NOTE — Assessment & Plan Note (Addendum)
Patient has a history of chronic left-sided weakness.  He states that he lost his balance and fell against the wall injuring his left lateral rib area earlier this week. He denies  Hitting his head or any other injury/trauma.  He also denies any loss of consciousness.    On exam- patient with very mild tenderness to the left lateral rib area with palpation; but no bruising or other obvious injury. Patient with no chest pain, chest pressure, shortness of breath, or pain with inspiration.   patient has hydrocodone at home; and patient was advised to take the hydrocodone as directed for left lateral rib pain. Advised patient to let us know if his symptoms persist or worsen in any way whatsoever.

## 2014-08-28 ENCOUNTER — Telehealth: Payer: Self-pay | Admitting: *Deleted

## 2014-08-28 NOTE — Telephone Encounter (Signed)
Left message for patient- following up on how he is doing after seeing Selena Lesser, NP last week after fall. To call us back if has questions or any problems

## 2014-08-29 ENCOUNTER — Ambulatory Visit: Payer: Medicare Other | Admitting: Physical Therapy

## 2014-09-04 ENCOUNTER — Ambulatory Visit: Payer: Medicare Other | Admitting: Physical Therapy

## 2014-09-04 ENCOUNTER — Encounter: Payer: Self-pay | Admitting: Physical Therapy

## 2014-09-04 DIAGNOSIS — C61 Malignant neoplasm of prostate: Secondary | ICD-10-CM | POA: Diagnosis not present

## 2014-09-04 DIAGNOSIS — R269 Unspecified abnormalities of gait and mobility: Secondary | ICD-10-CM

## 2014-09-04 NOTE — Therapy (Signed)
[name] [address] [place], [place], [address] Phone: [phone]   Fax:  [phone]  Physical Therapy Treatment  Patient Details  Name: Ivan Stark MRN: [id number] Date of Birth: [birth year] Referring Provider:  Unk Pinto, MD  Encounter Date: 09/04/2014      PT End of Session - 09/04/14 1259    Visit Number 10   Number of Visits 17   Date for PT Re-Evaluation 09/09/14   Authorization Type G-code every 10th visit.   PT Start Time 1105   PT Stop Time 1149   PT Time Calculation (min) 44 min   Activity Tolerance Patient tolerated treatment well   Behavior During Therapy WFL for tasks assessed/performed      Past Medical History  Diagnosis Date  . Hypertension   . Hyperlipidemia   . GERD (gastroesophageal reflux disease)   . Vitamin D deficiency   . History of colon polyps   . Complication of anesthesia     Difficult to wake up; and severe nausea and vomiting  . PONV (postoperative nausea and vomiting)   . Diabetes mellitus without complication     metformin  . Cancer 03/2014    prostate ca    Past Surgical History  Procedure Laterality Date  . Tonsillectomy    . Colonoscopy w/ polypectomy    . Esophagus surgery      Had esophagus stretched due to food getting trapped in his throat  . Craniotomy Bilateral 04/06/2014    Procedure: Bilateral Fronto-parietal craniotomy for resection of tumors with Curve;  Surgeon: Consuella Lose, MD;  Location: [facility];  Service: Neurosurgery;  Laterality: Bilateral;  Bilateral Fronto-parietal craniotomy for resection of tumors with Curve  . Colonoscopy N/A 04/19/2014    Procedure: COLONOSCOPY;  Surgeon: Winfield Cunas., MD;  Location: [facility];  Service: Endoscopy;  Laterality: N/A;    There were no vitals filed for this visit.  Visit Diagnosis:  Abnormality of gait - Plan: PT plan of care cert/re-cert      Subjective Assessment -  09/04/14 1242    Symptoms Pt had a fall on 3/15  while walking around the bed and initially did not have pain but began having rib pain on Thursday and went to MD.  No fractures/injuries per MD.  Evelina Bucy about a week for pain to resolve.   Pertinent History Active prostate cancer with brain metastasis   Patient Stated Goals Walk without RW   Currently in Pain? No/denies                       [facility] Adult PT Treatment/Exercise - 09/04/14 1116    Transfers   Transfers Sit to Stand;Stand to Sit   Sit to Stand 5: Supervision;With upper extremity assist;From chair/3-in-1   Stand to Sit 5: Supervision;With upper extremity assist;To chair/3-in-1   Ambulation/Gait   Ambulation/Gait Yes   Ambulation/Gait Assistance 4: Min guard   Ambulation Distance (Feet) 440 Feet  110 twice   Assistive device Rolling walker   Gait Pattern Step-through pattern;Decreased dorsiflexion - left;Left steppage;Right flexed knee in stance;Left flexed knee in stance;Decreased trunk rotation;Decreased stride length   Ambulation Surface Level;Indoor   Gait velocity 1.66 ft/sec.   Berg Balance Test   Sit to Stand Able to stand  independently using hands   Standing Unsupported Able to stand safely 2 minutes   Sitting with Back Unsupported but Feet Supported on Floor or Stool Able to sit safely  and securely 2 minutes   Stand to Sit Sits safely with minimal use of hands   Transfers Able to transfer safely, definite need of hands   Standing Unsupported with Eyes Closed Able to stand 10 seconds safely   Standing Ubsupported with Feet Together Able to place feet together independently and stand for 1 minute with supervision   From Standing, Reach Forward with Outstretched Arm Can reach confidently >25 cm (10")   From Standing Position, Pick up Object from [place] to pick up shoe safely and easily   From Standing Position, Turn to Look Behind Over each Shoulder Looks behind from both sides and weight shifts well    Turn 360 Degrees Able to turn 360 degrees safely but slowly   Standing Unsupported, Alternately Place Feet on Step/Stool Able to complete >2 steps/needs minimal assist   Standing Unsupported, One Foot in Front Able to plae foot ahead of the other independently and hold 30 seconds   Standing on One Leg Tries to lift leg/unable to hold 3 seconds but remains standing independently   Total Score 44                PT Education - 09/04/14 1258    Education provided Yes   Education Details Fall prevention strategies, renew of PT for 5 weeks per Geoffry Paradise, PT.   Person(s) Educated Patient   Methods Explanation;Handout   Comprehension Verbalized understanding          PT Short Term Goals - 08/08/14 0953    PT SHORT TERM GOAL #1   Title Pt will be independent in HEP to improve functional mobility. Target date: 08/08/14.   Status Partially Met   PT SHORT TERM GOAL #2   Title Asses BERG and write appropriate STG and LTG. Target date: 08/08/14.   Status Achieved   PT SHORT TERM GOAL #3   Title Pt will ambulated 300', with LRAD, over even/uneven terrain with supervision and no LOB to improve functional mobillity. Target date: 08/08/14.   Status Achieved   PT SHORT TERM GOAL #4   Title Pt will report zero falls in the last 4 weeks to improve safety during functional mobility. Target date: 08/08/14.   Baseline No falls in last 4 weeks.   Status Achieved   PT SHORT TERM GOAL #5   Title Pt will improve BERG balance score to >/=41/56 to decrease falls risk. Target date: 08/08/14.   Status Achieved           PT Long Term Goals - 09/04/14 1301    PT LONG TERM GOAL #1   Title Pt will verbalize understanding of falls prevention strategies to decrease falls risk. Target date: 09/05/14.   Baseline PT is requesting additonal 5 weeks of physical therapy: all unmet goals will be carried over to 10/14/14.   Status Achieved   PT LONG TERM GOAL #2   Title Pt will ambulate 600', with LRAD, over  even/uneven terrain at MOD I level in order to improve functional mobility at home and at work. Target date: 09/05/14.   Status Not Met   PT LONG TERM GOAL #3   Title Have pt complete FOTO and write appropriate goal. Target date: 09/05/14.   Status Achieved   PT LONG TERM GOAL #4   Title Pt will perform sit<>stand x10 with no UE support to improve safety during functional mobility. Target date: 09/05/14.   Status Not Met   PT LONG TERM GOAL #5   Title  Pt will improve gait speed, with LRAD, to >/=2.41f/sec. to ambulate safely in the community. Target date: 09/05/14.   Baseline --  1.66 ft/sec   Status Not Met   PT LONG TERM GOAL #6   Title Pt will improve BERG balance score to >/=45/56 to decrease falls risk. Target date: 09/05/14.   Status Not Met   PT LONG TERM GOAL #7   Title Pt will improve ABC score by 10% to improve quality of life. Target date: 09/05/14.   Status On-going               Plan - [id number]    Clinical Impression Statement Pt with fall several weeks ago while ambulating in house without RW.  Per JGeoffry Paradise plan to renew pt for 5 more weeks of PT.  Pt did not meet goal 2,4,5 or 6.  Goal 1 & 3 met.   Pt will benefit from skilled therapeutic intervention in order to improve on the following deficits Abnormal gait;Impaired flexibility;Decreased range of motion;Decreased endurance;Decreased knowledge of use of DME;Decreased balance;Decreased mobility;Decreased strength   Rehab Potential Good   PT Frequency 2x / week   PT Duration 8 weeks   PT Treatment/Interventions ADLs/Self Care Home Management;Gait training;Neuromuscular re-education;Biofeedback;Functional mobility training;Patient/family education;Therapeutic activities;Therapeutic exercise;Manual techniques;Balance training;DME Instruction   PT Next Visit Plan Follow up on any questions on fall prevention.  Continue gait and balance.   Consulted and Agree with Plan of Care Patient          G-Codes -  004/28/161306    Functional Assessment Tool Used Gait speed 1.66 ft/sec with RW, BERG 44/56   Functional Limitation Mobility: Walking and moving around   Mobility: Walking and Moving Around Current Status [phone] At least 20 percent but less than 40 percent impaired, limited or restricted   Mobility: Walking and Moving Around Goal Status ([phone] At least 1 percent but less than 20 percent impaired, limited or restricted      Problem List Patient Active Problem List   Diagnosis Date Noted  . Fall 08/27/2014  . Petechial rash 06/05/2014  . Hypotension 05/22/2014  . Immobility 05/22/2014  . Anorexia 05/22/2014  . Dehydration 05/22/2014  . Hypoalbuminemia 05/22/2014  . Desquamated skin 05/22/2014  . Small cell carcinoma of prostate   . Vision disturbance   . Cerebral meningioma   . Left flaccid hemiparesis   . Left hemiparesis 04/09/2014  . Brain metastases 04/06/2014  . Poor compliance with F/U 02/04/2014  . CKD stage 1 due to type 2 diabetes mellitus 08/20/2013  . Encounter for long-term (current) use of other medications 08/20/2013  . Hypertension   . Hyperlipidemia   . GERD (gastroesophageal reflux disease)   . Vitamin D deficiency   . History of colon polyps     Miller,Jennifer L 3April 28, 2016 2:10 PM  [address] [address]Phone: [phone]  Fax:  [id number]:10 PM Phone: [phone]: [phone]   G-Code and re-cert completed by PT.  JGeoffry Paradise PT,DPT 028-Apr-20162:10 PM Phone: [phone]: [phone]

## 2014-09-04 NOTE — Patient Instructions (Signed)
It is important to avoid accidents which may result in broken bones.  Here are a few ideas on how to make your home safer so you will be less likely to trip or fall.   Use nonskid mats or non slip strips in your shower or tub, on your bathroom floor and around sinks.  If you know that you have spilled water, wipe it up!  In the bathroom, it is important to have properly installed grab bars on the walls or on the edge of the tub.  Towel racks are NOT strong enough for you to hold onto or to pull on for support.  Stairs and hallways should have enough light.  Add lamps or night lights if you need ore light.  It is good to have handrails on both sides of the stairs if possible.  Always fix broken handrails right away.  It is important to see the edges of steps.  Paint the edges of outdoor steps white so you can see them better.  Put colored tape on the edge of inside steps.  Throw-rugs are dangerous because they can slide.  Removing the rugs is the best idea, but if they must stay, add adhesive carpet tape to prevent slipping.  Do not keep things on stairs or in the halls.  Remove small furniture that blocks the halls as it may cause you to trip.  Keep telephone and electrical cords out of the way where you walk.  Always were sturdy, rubber-soled shoes for good support.  Never wear just socks, especially on the stairs.  Socks may cause you to slip or fall.  Do not wear full-length housecoats as you can easily trip on the bottom.   Place the things you use the most on the shelves that are the easiest to reach.  If you use a stepstool, make sure it is in good condition.  If you feel unsteady, DO NOT climb, ask for help.  If a health professional advises you to use a cane or walker, do not be ashamed.  These items can keep you from falling and breaking your bones.   Fall Prevention and Home Safety Falls cause injuries and can affect all age groups. It is possible to use preventive measures to  significantly decrease the likelihood of falls. There are many simple measures which can make your home safer and prevent falls. OUTDOORS  Repair cracks and edges of walkways and driveways.  Remove high doorway thresholds.  Trim shrubbery on the main path into your home.  Have good outside lighting.  Clear walkways of tools, rocks, debris, and clutter.  Check that handrails are not broken and are securely fastened. Both sides of steps should have handrails.  Have leaves, snow, and ice cleared regularly.  Use sand or salt on walkways during winter months.  In the garage, clean up grease or oil spills. BATHROOM  Install night lights.  Install grab bars by the toilet and in the tub and shower.  Use non-skid mats or decals in the tub or shower.  Place a plastic non-slip stool in the shower to sit on, if needed.  Keep floors dry and clean up all water on the floor immediately.  Remove soap buildup in the tub or shower on a regular basis.  Secure bath mats with non-slip, double-sided rug tape.  Remove throw rugs and tripping hazards from the floors. BEDROOMS  Install night lights.  Make sure a bedside light is easy to reach.  Do not use   oversized bedding.  Keep a telephone by your bedside.  Have a firm chair with side arms to use for getting dressed.  Remove throw rugs and tripping hazards from the floor. KITCHEN  Keep handles on pots and pans turned toward the center of the stove. Use back burners when possible.  Clean up spills quickly and allow time for drying.  Avoid walking on wet floors.  Avoid hot utensils and knives.  Position shelves so they are not too high or low.  Place commonly used objects within easy reach.  If necessary, use a sturdy step stool with a grab bar when reaching.  Keep electrical cables out of the way.  Do not use floor polish or wax that makes floors slippery. If you must use wax, use non-skid floor wax.  Remove throw rugs  and tripping hazards from the floor. STAIRWAYS  Never leave objects on stairs.  Place handrails on both sides of stairways and use them. Fix any loose handrails. Make sure handrails on both sides of the stairways are as long as the stairs.  Check carpeting to make sure it is firmly attached along stairs. Make repairs to worn or loose carpet promptly.  Avoid placing throw rugs at the top or bottom of stairways, or properly secure the rug with carpet tape to prevent slippage. Get rid of throw rugs, if possible.  Have an electrician put in a light switch at the top and bottom of the stairs. OTHER FALL PREVENTION TIPS  Wear low-heel or rubber-soled shoes that are supportive and fit well. Wear closed toe shoes.  When using a stepladder, make sure it is fully opened and both spreaders are firmly locked. Do not climb a closed stepladder.  Add color or contrast paint or tape to grab bars and handrails in your home. Place contrasting color strips on first and last steps.  Learn and use mobility aids as needed. Install an electrical emergency response system.  Turn on lights to avoid dark areas. Replace light bulbs that burn out immediately. Get light switches that glow.  Arrange furniture to create clear pathways. Keep furniture in the same place.  Firmly attach carpet with non-skid or double-sided tape.  Eliminate uneven floor surfaces.  Select a carpet pattern that does not visually hide the edge of steps.  Be aware of all pets. OTHER HOME SAFETY TIPS  Set the water temperature for 120 F (48.8 C).  Keep emergency numbers on or near the telephone.  Keep smoke detectors on every level of the home and near sleeping areas. Document Released: 05/15/2002 Document Revised: 11/24/2011 Document Reviewed: 08/14/2011 Exit[employer] Patient Information 2015 [employer]. This information is not intended to replace advice given to you by your health care provider. Make sure you discuss any  questions you have with your health care provider.  

## 2014-09-05 ENCOUNTER — Ambulatory Visit (INDEPENDENT_AMBULATORY_CARE_PROVIDER_SITE_OTHER): Payer: Medicare Other | Admitting: Internal Medicine

## 2014-09-05 ENCOUNTER — Encounter: Payer: Self-pay | Admitting: Internal Medicine

## 2014-09-05 VITALS — BP 118/76 | HR 92 | Temp 97.5°F | Resp 16 | Ht 75.25 in | Wt 183.4 lb

## 2014-09-05 DIAGNOSIS — E559 Vitamin D deficiency, unspecified: Secondary | ICD-10-CM

## 2014-09-05 DIAGNOSIS — K219 Gastro-esophageal reflux disease without esophagitis: Secondary | ICD-10-CM

## 2014-09-05 DIAGNOSIS — Z0001 Encounter for general adult medical examination with abnormal findings: Secondary | ICD-10-CM

## 2014-09-05 DIAGNOSIS — E1140 Type 2 diabetes mellitus with diabetic neuropathy, unspecified: Secondary | ICD-10-CM

## 2014-09-05 DIAGNOSIS — Z9181 History of falling: Secondary | ICD-10-CM

## 2014-09-05 DIAGNOSIS — E785 Hyperlipidemia, unspecified: Secondary | ICD-10-CM

## 2014-09-05 DIAGNOSIS — Z1331 Encounter for screening for depression: Secondary | ICD-10-CM

## 2014-09-05 DIAGNOSIS — Z79899 Other long term (current) drug therapy: Secondary | ICD-10-CM

## 2014-09-05 DIAGNOSIS — C61 Malignant neoplasm of prostate: Secondary | ICD-10-CM

## 2014-09-05 DIAGNOSIS — Z0000 Encounter for general adult medical examination without abnormal findings: Secondary | ICD-10-CM

## 2014-09-05 DIAGNOSIS — I150 Renovascular hypertension: Secondary | ICD-10-CM

## 2014-09-05 DIAGNOSIS — E1122 Type 2 diabetes mellitus with diabetic chronic kidney disease: Secondary | ICD-10-CM

## 2014-09-05 DIAGNOSIS — R6889 Other general symptoms and signs: Secondary | ICD-10-CM

## 2014-09-05 DIAGNOSIS — N181 Chronic kidney disease, stage 1: Secondary | ICD-10-CM

## 2014-09-05 LAB — BASIC METABOLIC PANEL WITH GFR
BUN: 11 mg/dL (ref 6–23)
CO2: 27 mEq/L (ref 19–32)
Calcium: 9.7 mg/dL (ref 8.4–10.5)
Chloride: 102 mEq/L (ref 96–112)
Creat: 0.81 mg/dL (ref 0.50–1.35)
GFR, Est African American: >89 mL/min
Glucose, Bld: 196 mg/dL — ABNORMAL HIGH (ref 70–99)
POTASSIUM: 4.2 meq/L (ref 3.5–5.3)
Sodium: 141 mEq/L (ref 135–145)

## 2014-09-05 LAB — CBC WITH DIFFERENTIAL/PLATELET
BASOS ABS: 0.1 10*3/uL (ref 0.0–0.1)
Basophils Relative: 1 % (ref 0–1)
EOS ABS: 0 10*3/uL (ref 0.0–0.7)
Eosinophils Relative: 0 % (ref 0–5)
HCT: 38.6 % — ABNORMAL LOW (ref 39.0–52.0)
Hemoglobin: 12.1 g/dL — ABNORMAL LOW (ref 13.0–17.0)
LYMPHS ABS: 0.7 10*3/uL (ref 0.7–4.0)
Lymphocytes Relative: 12 % (ref 12–46)
MCH: 27.6 pg (ref 26.0–34.0)
MCHC: 31.3 g/dL (ref 30.0–36.0)
MCV: 88.1 fL (ref 78.0–100.0)
MONO ABS: 0.7 10*3/uL (ref 0.1–1.0)
MONOS PCT: 12 % (ref 3–12)
MPV: 10.2 fL (ref 8.6–12.4)
NEUTROS PCT: 75 % (ref 43–77)
Neutro Abs: 4.6 10*3/uL (ref 1.7–7.7)
Platelets: 461 10*3/uL — ABNORMAL HIGH (ref 150–400)
RBC: 4.38 MIL/uL (ref 4.22–5.81)
RDW: 19.3 % — AB (ref 11.5–15.5)
WBC: 6.1 10*3/uL (ref 4.0–10.5)

## 2014-09-05 LAB — HEMOGLOBIN A1C
Hgb A1c MFr Bld: 8.5 % — ABNORMAL HIGH (ref <5.7)
Mean Plasma Glucose: 197 mg/dL — ABNORMAL HIGH (ref <117)

## 2014-09-05 LAB — LIPID PANEL
CHOLESTEROL: 230 mg/dL — AB (ref 0–200)
HDL: 34 mg/dL — AB (ref >=40)
LDL CALC: 156 mg/dL — AB (ref 0–99)
TRIGLYCERIDES: 198 mg/dL — AB (ref <150)
Total CHOL/HDL Ratio: 6.8 Ratio
VLDL: 40 mg/dL (ref 0–40)

## 2014-09-05 LAB — HEPATIC FUNCTION PANEL
ALBUMIN: 3.6 g/dL (ref 3.5–5.2)
ALT: 13 U/L (ref 0–53)
AST: 15 U/L (ref 0–37)
Alkaline Phosphatase: 88 U/L (ref 39–117)
BILIRUBIN TOTAL: 0.4 mg/dL (ref 0.2–1.2)
Total Protein: 6.1 g/dL (ref 6.0–8.3)

## 2014-09-05 LAB — URIC ACID: URIC ACID, SERUM: 5.8 mg/dL (ref 4.0–7.8)

## 2014-09-05 LAB — TSH: TSH: 0.457 u[IU]/mL (ref 0.350–4.500)

## 2014-09-05 LAB — MAGNESIUM: Magnesium: 2.1 mg/dL (ref 1.5–2.5)

## 2014-09-05 NOTE — Patient Instructions (Signed)

## 2014-09-05 NOTE — Progress Notes (Signed)
Patient ID: DELQUAN POUCHER, male   DOB: [birth year], 69 y.o.   MRN: [id number]  [facility] VISIT AND CPE  Assessment:   1. Renovascular hypertension  - EKG 12-Lead - TSH  2. Hyperlipidemia  - Lipid panel  3. CKD stage 1 due to type 2 diabetes mellitus  - Microalbumin / creatinine urine ratio - Hemoglobin A1c - Insulin, random  - Encouraged better compliance with diet  4. Vitamin D deficiency  - Vit D  25 hydroxy (rtn osteoporosis monitoring)  5. Small cell carcinoma of prostate  - PSA  6. Gastroesophageal reflux disease   7. Depression screen  - Screen Negative  8. 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 At moderate risk for fall   10. Medication management  - Urine Microscopic - Uric acid - CBC with Differential/Platelet - BASIC METABOLIC PANEL WITH GFR - Hepatic function panel - Magnesium  11. Routine general medical examination at a health care facility   Plan:   During the course of the visit the patient was educated and counseled about appropriate screening and preventive services including:    Pneumococcal vaccine   Influenza vaccine  Td vaccine  Screening electrocardiogram  Bone densitometry screening  Colorectal cancer screening  Diabetes screening  Glaucoma screening  Nutrition counseling   Advanced directives: requested  Screening recommendations, referrals: Vaccinations: Immunization History  Administered Date(s) Administered  . Td 06/08/2002  . 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  Increase physical activity: AHA recommends 150 minutes of physical activity a week.  Medications reviewed Diabetes is not at goal, ACE/ARB therapy: No Urinary Incontinence is not an issue: discussed non pharmacology and pharmacology options.  Fall risk: moderate- discussed PT, home fall assessment, medications.   Subjective:    Ivan Stark  presents for Medicare Annual Wellness Visit and complete physical.  No prior medicare wellness visit is known.  He has had elevated blood pressure since 1998. His blood pressure has been controlled at home, today their BP is BP: 118/76 mmHg He does not workout. He denies chest pain, shortness of breath, dizziness.  He is not on cholesterol medication and denies myalgias. His cholesterol is not at goal. The cholesterol last visit was:  Lab Results  Component Value Date   CHOL 230* 09/05/2014   HDL 34* 09/05/2014   LDLCALC 156* 09/05/2014   TRIG 198* 09/05/2014   CHOLHDL 6.8 09/05/2014   He has had diabetes for 18 years since 1998. He has not been working on diet and exercise for prediabetes, and denies foot ulcerations, hyperglycemia, hypoglycemia , increased appetite, nausea, polydipsia and polyuria, but he does have intermittent non painful tingling or numb paresthesias of his feet. He has never been very compliant with a prudent diet. He infrequently checks CBG's which he states usu range betw 100-200 mg%.  Last A1C in the office was:  Lab Results  Component Value Date   HGBA1C 7.9* 02/05/2014   Patient is on Vitamin D supplement.  Vitamin D was very low at 32 in 2010.  Lab Results  Component Value Date   VD25OH 90*  02/05/2014      Has GERD , which has been relatively asymptomatic.   In Oct 2-015, pat under went craniotomy for several meningomas and was also found to have a malignant lesion later proven to be a small cell cancer arising  from the prostate. He has had a mild persistent Lt HP assym  to lower vs upper and ambulates with crutches. He did receive cranial radiation. 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  He has just completed cycle 3 of 4 chemotherapy sessions by Dr [name].   Names of Other Physician/Practitioners you currently use: 1. [facility] here for primary care 2. [facility] does his eye care, last visit > 2 yrs - encouraged eye exam 3. No dentist, last visit > 5 yrs - encouraged dental evaluate  Patient Care Team: Unk Pinto, MD as PCP - General (Internal Medicine) Ladell Pier, MD as Consulting Physician (Oncology) Consuella Lose, MD as Consulting Physician (Neurosurgery) Meredith Staggers, MD as Consulting Physician (Physical Medicine and Rehabilitation) Tyler Pita, MD as Consulting Physician (Radiation Oncology) Laurence Spates, MD as Consulting Physician (Gastroenterology) Lelon Perla, MD as Consulting Physician (Cardiology)  Medication Review: Medication Sig  . ALPRAZolam  0.5 MG tablet Take 1 tablet (0.5 mg total) by mouth 2 (two) times daily as needed for anxiety.  . Amino Acids-Protein Hydrolys (FEEDING SUPPLEMENT, PRO-STAT SUGAR FREE 64,) LIQD Take 30 mLs by mouth 3 (three) times daily with meals.  Marland Kitchen VITAMIN C 1000 MG tablet Take 1,000 mg by mouth daily.  Marland Kitchen aspirin EC 81 MG tablet Take 81 mg by mouth daily.  Darron Doom WITH D 500-200  Take 1 tablet by mouth 2 (two) times daily.  Marland Kitchen VITAMIN D  Take 4,000 Units by mouth daily.   Marland Kitchen emollient (BIAFINE) cream Apply topically as needed.  Marland Kitchen glimepiride (AMARYL) 4 MG tablet Take 0.5 tablets (2 mg total) by mouth 2 (two) times daily.  . NORCO 5-325 MG  Take 1-2 tablets by mouth every 6 hours as needed for moderate pain.  . metFORMIN -XR 500 MG 24 hr tablet Take 1 tablet  2  times daily   . MULTIVITAMIN WITH MINERALS Take 1 tablet by mouth daily.  .  polyethylene glycol MIRALAX  packet Take 60 g by mouth 2 (two) times daily.  . prochlorperazine  10 MG tablet Take 1 tablet (10 mg total) by mouth every 6 (six) hours as needed for nausea or vomiting.  Donavan Burnet Take 2 tablets (17.2 mg total) by mouth 2 (two) times daily. For constipation. Adjust as  needed  . simethicone  80 MG tab Chew 1 tablet (80 mg total) by mouth 4 (four) times daily as needed for flatulence.   . topical emolient (BIAFINE) emulsion   Topical BID Tyler Pita, MD       Current Problems (verified) Patient Active Problem List   Diagnosis Date Noted  . 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 Hypotension 05/22/2014  . Hypoalbuminemia 05/22/2014  . Small cell carcinoma of prostate   . Vision disturbance   . Cerebral meningioma   . Left hemiparesis 04/09/2014  . Brain metastases 04/06/2014  . Poor compliance with F/U 02/04/2014  . 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 Medication management 08/20/2013  . Hypertension   . Hyperlipidemia   . GERD (gastroesophageal reflux disease)   . Vitamin D deficiency   . History of colon polyps  Screening Tests Health Maintenance  Topic Date Due  . FOOT EXAM  04/24/1957  . OPHTHALMOLOGY EXAM  04/24/1957  . ZOSTAVAX  04/25/2007  . PNA vac Low Risk Adult (1 of 2 - PCV13) 04/24/2012  . HEMOGLOBIN A1C  08/06/2014  . URINE MICROALBUMIN  08/22/2014  . INFLUENZA VACCINE  09/05/2014 (Originally 01/06/2014)  . TETANUS/TDAP  06/11/2021  . COLONOSCOPY  04/19/2024   Immunization History  Administered Date(s) Administered  . Td 06/08/2002  . Tdap 06/12/2011   Preventative care: Last colonoscopy: 04/19/2014  History reviewed: allergies, current medications, past family history, past medical history, past social history, past surgical history and problem list  Risk Factors: Tobacco History  Substance Use Topics  . Smoking status: Never Smoker   . Smokeless tobacco: Not on file  . Alcohol Use: No   He does not  smoke.  Patient is not a former smoker. Are there smokers in your home (other than you)?  No  Alcohol Current alcohol use: none  Caffeine Current caffeine use: denies use  Exercise Current exercise: Physical Therapy 2 x week  Nutrition/Diet Current diet: in general, an "unhealthy" diet, on average, 3 meals per day  Cardiac risk factors: advanced age (older than 40 for men, 57 for women), diabetes mellitus, dyslipidemia, hypertension, male gender and sedentary lifestyle.  Depression Screen (Note: if answer to either of the following is "Yes", a more complete depression screening is indicated)   Q1: Over the past two weeks, have you felt down, depressed or hopeless? No  Q2: Over the past two weeks, have you felt little interest or pleasure in doing things? No  Have you lost interest or pleasure in daily life? No  Do you often feel hopeless? No  Do you cry easily over simple problems? No  Activities of Daily Living In your present state of health, do you have any difficulty performing the following activities?:  Driving? No Managing money?  No Feeding yourself? No Getting from bed to chair? No Climbing a flight of stairs? No Preparing food and eating?: No Bathing or showering? No Getting dressed: No Getting to the toilet? No Using the toilet:No Moving around from place to place: No In the past year have you fallen or had a near fall?:Yes, w/o serious injury   Are you sexually active?  No  Do you have more than one partner?  No  Vision Difficulties: No  Hearing Difficulties: No Do you often ask people to speak up or repeat themselves? No Do you experience ringing or noises in your ears? No Do you have difficulty understanding soft or whispered voices? No  Cognition  Do you feel that you have a problem with memory?No  Do you often misplace items? No  Do you feel safe at home?  Yes  Advanced directives Does patient have a [place]? No - offered  forms Does patient have a Living Will? No - offered forms  Past Medical History  Diagnosis Date  . Hypertension   . Hyperlipidemia   . GERD (gastroesophageal reflux disease)   . Vitamin D deficiency   . History of colon polyps   . Complication of anesthesia     Difficult to wake up; and severe nausea and vomiting  . PONV (postoperative nausea and vomiting)   . Diabetes mellitus without complication     metformin  . Cancer 03/2014    prostate ca   Past Surgical History  Procedure Laterality Date  . Tonsillectomy    .  Colonoscopy w/ polypectomy    . Esophagus surgery      Had esophagus stretched due to food getting trapped in his throat  . Craniotomy Bilateral 04/06/2014    Procedure: Bilateral Fronto-parietal craniotomy for resection of tumors with Curve;  Surgeon: Consuella Lose, MD;  Location: [place] [facility];  Service: Neurosurgery;  Laterality: Bilateral;  Bilateral Fronto-parietal craniotomy for resection of tumors with Curve  . Colonoscopy N/A 04/19/2014    Procedure: COLONOSCOPY;  Surgeon: Winfield Cunas., MD;  Location: [facility] ENDOSCOPY;  Service: Endoscopy;  Laterality: N/A;   ROS: Constitutional: Denies fever, chills, weight loss/gain, headaches, insomnia, fatigue, night sweats or change in appetite. Eyes: Denies redness, blurred vision, diplopia, discharge, itchy or watery eyes.  ENT: Denies discharge, congestion, post nasal drip, epistaxis, sore throat, earache, hearing loss, dental pain, Tinnitus, Vertigo, Sinus pain or snoring.  Cardio: Denies chest pain, palpitations, irregular heartbeat, syncope, dyspnea, diaphoresis, orthopnea, PND, claudication or edema Respiratory: denies cough, dyspnea, DOE, pleurisy, hoarseness, laryngitis or wheezing.  Gastrointestinal: Denies dysphagia, heartburn, reflux, water brash, pain, cramps, nausea, vomiting, bloating, diarrhea, constipation, hematemesis, melena, hematochezia, jaundice or hemorrhoids Genitourinary: Denies dysuria,  frequency, urgency, nocturia, hesitancy, discharge, hematuria or flank pain Musculoskeletal: Denies arthralgia, myalgia, stiffness, Jt. Swelling, pain, limp or strain/sprain. Denies Falls. Skin: Denies puritis, rash, hives, warts, acne, eczema or change in skin lesion Neuro: No weakness, tremor, incoordination, spasms, paresthesia or pain Psychiatric: Denies confusion, memory loss or sensory loss. Denies Depression. Endocrine: Denies change in weight, skin, hair change, nocturia, and paresthesia, diabetic polys, visual blurring or hyper / hypo glycemic episodes.  Heme/Lymph: No excessive bleeding, bruising or enlarged lymph nodes.  Objective:     BP 118/76 mmHg  Pulse 92  Temp(Src) 97.5 F (36.4 C)  Resp 16  Ht 6' 3.25" (1.911 m)  Wt 183 lb 6.4 oz (83.19 kg)  BMI 22.78 kg/m2  General Appearance:  Alert  WD/WN, male , in no apparent distress. Eyes: PERRLA, EOMs, conjunctiva no swelling or erythema, normal fundi and vessels. Sinuses: No frontal/maxillary tenderness ENT/Mouth: EACs patent / TMs  nl. Nares clear without erythema, swelling, mucoid exudates. Oral hygiene is good. No erythema, swelling, or exudate. Tongue normal, non-obstructing. Tonsils not swollen or erythematous. Hearing normal.  Neck: Supple, thyroid normal. No bruits, nodes or JVD. Respiratory: Respiratory effort normal.  BS equal and clear bilateral without rales, rhonci, wheezing or stridor. Cardio: Heart sounds are normal with regular rate and rhythm and no murmurs, rubs or gallops. Peripheral pulses are normal and equal bilaterally without edema. No aortic or femoral bruits. Chest: symmetric with normal excursions and percussion.  Abdomen: Flat, soft, with nl bowel sounds. Nontender, no guarding, rebound, hernias, masses, or organomegaly.  Lymphatics: Non tender without lymphadenopathy.  Genitourinary: Deferred to Dr Gaynelle Arabian Musculoskeletal: Full ROM all peripheral extremities, joint stability, 5/5 strength, and sl  limping gait with crutches favoring the left. Mild LLE weakness.  Skin: Warm and dry without rashes, lesions, cyanosis, clubbing or  ecchymosis.  Neuro: Cranial nerves intact, Mild hyperreflexia of the Left upper/lower extremities. Normal muscle tone, no cerebellar symptoms. Sensation decreased in a distal stocking distribution at the ankle level bilateral by monofilament testing.  Pysch: Awake and oriented X 3 with normal affect, insight and judgment appropriate.   Cognitive Testing  Alert? Yes  Normal Appearance? Yes  Oriented to person? Yes  Place? Yes   Time? Yes  Recall of three objects?  Yes  Can perform simple calculations? Yes  Displays appropriate judgment? Yes  Can read the correct time from a watch/clock? Yes  Medicare Attestation I have personally reviewed: The patient's medical and social history Their use of alcohol, tobacco or illicit drugs Their current medications and supplements The patient's functional ability including ADLs,fall risks, home safety risks, cognitive, and hearing and visual impairment Diet and physical activities Evidence for depression or mood disorders  The patient's weight, height, BMI, and visual acuity have been recorded in the chart.  I have made referrals, counseling, and provided education to the patient based on review of the above and I have provided the patient with a written personalized care plan for preventive services.  Over 40 minutes of exam, counseling, chart review was performed.   Aylen Rambert DAVID, MD   09/05/2014

## 2014-09-06 ENCOUNTER — Ambulatory Visit: Payer: Medicare Other

## 2014-09-06 DIAGNOSIS — C61 Malignant neoplasm of prostate: Secondary | ICD-10-CM | POA: Diagnosis not present

## 2014-09-06 DIAGNOSIS — R269 Unspecified abnormalities of gait and mobility: Secondary | ICD-10-CM

## 2014-09-06 LAB — URINALYSIS, MICROSCOPIC ONLY
BACTERIA UA: NONE SEEN
CRYSTALS: NONE SEEN
Casts: NONE SEEN
Squamous Epithelial / LPF: NONE SEEN

## 2014-09-06 LAB — MICROALBUMIN / CREATININE URINE RATIO
CREATININE, URINE: 119 mg/dL
MICROALB/CREAT RATIO: 4.2 mg/g (ref 0.0–30.0)
Microalb, Ur: 0.5 mg/dL (ref <2.0)

## 2014-09-06 LAB — PSA: PSA: 0.66 ng/mL (ref <=4.00)

## 2014-09-06 LAB — VITAMIN D 25 HYDROXY (VIT D DEFICIENCY, FRACTURES): Vit D, 25-Hydroxy: 64 ng/mL (ref 30–100)

## 2014-09-06 LAB — INSULIN, RANDOM: Insulin: 18.8 u[IU]/mL (ref 2.0–19.6)

## 2014-09-06 NOTE — Therapy (Signed)
[facility] [address] [place], [place], [address] Phone: [phone]   Fax:  [phone]  Physical Therapy Treatment  Patient Details  Name: Ivan Stark MRN: [id number] Date of Birth: 05-15-1946 Referring Provider:  Unk Pinto, MD  Encounter Date: 09/06/2014      PT End of Session - 09/06/14 1302    Visit Number 11   Number of Visits 17   Date for PT Re-Evaluation 09/09/14  Renewed through 10/14/14   Authorization Type G-code every 10th visit.   PT Start Time 1017   PT Stop Time 1058   PT Time Calculation (min) 41 min   Equipment Utilized During Treatment Gait belt   Activity Tolerance Patient tolerated treatment well   Behavior During Therapy WFL for tasks assessed/performed      Past Medical History  Diagnosis Date  . Hypertension   . Hyperlipidemia   . GERD (gastroesophageal reflux disease)   . Vitamin D deficiency   . History of colon polyps   . Complication of anesthesia     Difficult to wake up; and severe nausea and vomiting  . PONV (postoperative nausea and vomiting)   . Diabetes mellitus without complication     metformin  . Cancer 03/2014    prostate ca    Past Surgical History  Procedure Laterality Date  . Tonsillectomy    . Colonoscopy w/ polypectomy    . Esophagus surgery      Had esophagus stretched due to food getting trapped in his throat  . Craniotomy Bilateral 04/06/2014    Procedure: Bilateral Fronto-parietal craniotomy for resection of tumors with Curve;  Surgeon: Consuella Lose, MD;  Location: [place] [facility];  Service: Neurosurgery;  Laterality: Bilateral;  Bilateral Fronto-parietal craniotomy for resection of tumors with Curve  . Colonoscopy N/A 04/19/2014    Procedure: COLONOSCOPY;  Surgeon: Winfield Cunas., MD;  Location: [facility] ENDOSCOPY;  Service: Endoscopy;  Laterality: N/A;    There were no vitals filed for this visit.  Visit Diagnosis:  Abnormality of  gait      Subjective Assessment - 09/06/14 1021    Symptoms Pt denied falls or changes since last visit. Pt saw his family MD yesterday and has not received any lab work results yet.   Pertinent History Active prostate cancer with brain metastasis   Patient Stated Goals Walk without RW   Currently in Pain? No/denies                       [facility] Adult PT Treatment/Exercise - 09/06/14 1027    Ambulation/Gait   Ambulation/Gait Yes   Ambulation/Gait Assistance 4: Min guard   Ambulation/Gait Assistance Details Cues to improve upright posture and L heel strike and stride length. Pt required seated rest breaks after each bout of amb and during outdoor amb(two seated rest breaks).   Ambulation Distance (Feet) --  230', 117' indoors and 400' outdoors   Assistive device Straight cane;Rolling walker   Gait Pattern Step-through pattern;Decreased dorsiflexion - left;Left steppage;Right flexed knee in stance;Left flexed knee in stance;Decreased trunk rotation;Decreased stride length; decreased B hip extension   Ambulation Surface Level;Unlevel;Indoor;Outdoor                PT Education - 09/06/14 1300    Education provided Yes   Education Details PT discussed the importance of using RW at all times for now, as he experienced a fall a few weeks ago when ambulating without RW. PT  also encouraged pt to perform HEP next week as he does not have PT due to personal obligations. PT showed pt which exercises to concentrate on.   Person(s) Educated Patient   Methods Explanation   Comprehension Verbalized understanding          PT Short Term Goals - 08/08/14 0953    PT SHORT TERM GOAL #1   Title Pt will be independent in HEP to improve functional mobility. Target date: 08/08/14.   Status Partially Met   PT SHORT TERM GOAL #2   Title Asses BERG and write appropriate STG and LTG. Target date: 08/08/14.   Status Achieved   PT SHORT TERM GOAL #3   Title Pt will ambulated 300', with  LRAD, over even/uneven terrain with supervision and no LOB to improve functional mobillity. Target date: 08/08/14.   Status Achieved   PT SHORT TERM GOAL #4   Title Pt will report zero falls in the last 4 weeks to improve safety during functional mobility. Target date: 08/08/14.   Baseline No falls in last 4 weeks.   Status Achieved   PT SHORT TERM GOAL #5   Title Pt will improve BERG balance score to >/=41/56 to decrease falls risk. Target date: 08/08/14.   Status Achieved           PT Long Term Goals - 09/06/14 1305    PT LONG TERM GOAL #1   Title Pt will verbalize understanding of falls prevention strategies to decrease falls risk. Target date: 09/05/14.   Baseline PT is requesting additonal 5 weeks of physical therapy: all unmet goals will be carried over to 10/14/14.   Status Achieved   PT LONG TERM GOAL #2   Title Pt will ambulate 600', with LRAD, over even/uneven terrain at MOD I level in order to improve functional mobility at home and at work. Target date: 10/14/14.   Status On-going   PT LONG TERM GOAL #3   Title Have pt complete FOTO and write appropriate goal. Target date: 09/05/14.   Status Achieved   PT LONG TERM GOAL #4   Title Pt will perform sit<>stand x10 with no UE support to improve safety during functional mobility. Target date: 10/14/14.   Status On-going   PT LONG TERM GOAL #5   Title Pt will improve gait speed, with LRAD, to >/=2.58f/sec. to ambulate safely in the community. Target date: 10/14/14.   Baseline --  1.66 ft/sec   Status On-going   PT LONG TERM GOAL #6   Title Pt will improve BERG balance score to >/=45/56 to decrease falls risk. Target date: 10/14/14.   Status On-going   PT LONG TERM GOAL #7   Title Pt will improve ABC score by 10% to improve quality of life. Target date: 10/14/14.   Status On-going               Plan - 09/06/14 1303    Clinical Impression Statement Pt limited today, as his endurance has decreased since fall, as he missed PT and  was fearful of falling again so he did not ambulate longer distances last week. Pt demonstrated improved sequencing and L heel strike after first 200' of amb. Continue with POC.   Pt will benefit from skilled therapeutic intervention in order to improve on the following deficits Abnormal gait;Impaired flexibility;Decreased range of motion;Decreased endurance;Decreased knowledge of use of DME;Decreased balance;Decreased mobility;Decreased strength   Rehab Potential Good   PT Frequency 2x / week   PT Duration Other (comment)  8 weeks plus renewed for additional 5 weeks.   PT Treatment/Interventions ADLs/Self Care Home Management;Gait training;Neuromuscular re-education;Biofeedback;Functional mobility training;Patient/family education;Therapeutic activities;Therapeutic exercise;Manual techniques;Balance training;DME Instruction   PT Next Visit Plan Dynamic gait with SPC and dynamic balance.   PT Home Exercise Plan Balance and strength HEP.    Consulted and Agree with Plan of Care Patient        Problem List Patient Active Problem List   Diagnosis Date Noted  . 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 Hypotension 05/22/2014  . Hypoalbuminemia 05/22/2014  . Small cell carcinoma of prostate   . Vision disturbance   . Cerebral meningioma   . Left hemiparesis 04/09/2014  . Brain metastases 04/06/2014  . Poor compliance with F/U 02/04/2014  . 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 Medication management 08/20/2013  . Hypertension   . Hyperlipidemia   . GERD (gastroesophageal reflux disease)   . Vitamin D deficiency   . History of colon polyps     Miller,Jennifer L 09/06/2014, 1:09 PM  [facility] [address] [place], [place], [address] Phone: [phone]   Fax:  [phone]     Geoffry Paradise, PT,DPT 09/06/2014 1:09 PM Phone: [phone] Fax: [phone]

## 2014-09-07 ENCOUNTER — Telehealth: Payer: Self-pay | Admitting: Radiation Therapy

## 2014-09-07 ENCOUNTER — Other Ambulatory Visit: Payer: Self-pay | Admitting: Radiation Therapy

## 2014-09-07 DIAGNOSIS — C7931 Secondary malignant neoplasm of brain: Secondary | ICD-10-CM

## 2014-09-07 NOTE — Telephone Encounter (Signed)
Opened in Error.

## 2014-09-09 ENCOUNTER — Other Ambulatory Visit: Payer: Self-pay | Admitting: Oncology

## 2014-09-11 ENCOUNTER — Telehealth: Payer: Self-pay | Admitting: Nurse Practitioner

## 2014-09-11 ENCOUNTER — Other Ambulatory Visit (HOSPITAL_BASED_OUTPATIENT_CLINIC_OR_DEPARTMENT_OTHER): Payer: Medicare Other

## 2014-09-11 ENCOUNTER — Telehealth: Payer: Self-pay | Admitting: *Deleted

## 2014-09-11 ENCOUNTER — Ambulatory Visit: Payer: Medicare Other

## 2014-09-11 ENCOUNTER — Ambulatory Visit (HOSPITAL_BASED_OUTPATIENT_CLINIC_OR_DEPARTMENT_OTHER): Payer: Medicare Other | Admitting: Nurse Practitioner

## 2014-09-11 VITALS — BP 126/71 | HR 84 | Temp 98.0°F | Resp 18 | Ht 75.25 in | Wt 186.5 lb

## 2014-09-11 DIAGNOSIS — C7931 Secondary malignant neoplasm of brain: Secondary | ICD-10-CM

## 2014-09-11 DIAGNOSIS — R531 Weakness: Secondary | ICD-10-CM | POA: Diagnosis not present

## 2014-09-11 DIAGNOSIS — C61 Malignant neoplasm of prostate: Secondary | ICD-10-CM

## 2014-09-11 LAB — CBC WITH DIFFERENTIAL/PLATELET
BASO%: 0.5 % (ref 0.0–2.0)
BASOS ABS: 0 10*3/uL (ref 0.0–0.1)
EOS%: 0.2 % (ref 0.0–7.0)
Eosinophils Absolute: 0 10*3/uL (ref 0.0–0.5)
HEMATOCRIT: 35.9 % — AB (ref 38.4–49.9)
HEMOGLOBIN: 11.2 g/dL — AB (ref 13.0–17.1)
LYMPH#: 0.9 10*3/uL (ref 0.9–3.3)
LYMPH%: 14.1 % (ref 14.0–49.0)
MCH: 27.7 pg (ref 27.2–33.4)
MCHC: 31.2 g/dL — ABNORMAL LOW (ref 32.0–36.0)
MCV: 88.9 fL (ref 79.3–98.0)
MONO#: 0.5 10*3/uL (ref 0.1–0.9)
MONO%: 8.7 % (ref 0.0–14.0)
NEUT#: 4.7 10*3/uL (ref 1.5–6.5)
NEUT%: 76.5 % — AB (ref 39.0–75.0)
Platelets: 344 10*3/uL (ref 140–400)
RBC: 4.04 10*6/uL — ABNORMAL LOW (ref 4.20–5.82)
RDW: 18.7 % — ABNORMAL HIGH (ref 11.0–14.6)
WBC: 6.2 10*3/uL (ref 4.0–10.3)

## 2014-09-11 LAB — COMPREHENSIVE METABOLIC PANEL (CC13)
ALT: 10 U/L (ref 0–55)
AST: 10 U/L (ref 5–34)
Albumin: 3.1 g/dL — ABNORMAL LOW (ref 3.5–5.0)
Alkaline Phosphatase: 80 U/L (ref 40–150)
Anion Gap: 11 mEq/L (ref 3–11)
BUN: 10.5 mg/dL (ref 7.0–26.0)
CO2: 24 meq/L (ref 22–29)
Calcium: 8.9 mg/dL (ref 8.4–10.4)
Chloride: 107 mEq/L (ref 98–109)
Creatinine: 0.9 mg/dL (ref 0.7–1.3)
EGFR: 89 mL/min/{1.73_m2} — ABNORMAL LOW (ref >90)
GLUCOSE: 244 mg/dL — AB (ref 70–140)
Potassium: 3.9 mEq/L (ref 3.5–5.1)
SODIUM: 141 meq/L (ref 136–145)
Total Bilirubin: 0.44 mg/dL (ref 0.20–1.20)
Total Protein: 6 g/dL — ABNORMAL LOW (ref 6.4–8.3)

## 2014-09-11 NOTE — Progress Notes (Addendum)
Ivan Stark OFFICE PROGRESS NOTE   Diagnosis:  Small cell carcinoma of the prostate  INTERVAL HISTORY:   Ivan Stark returns as scheduled. He completed cycle 4 carboplatin/etoposide on 08/14/2014. He had a single episode of nausea. No mouth sores. No diarrhea. His wife feels that he is weaker since his last treatment. She has noted that he is intermittently "dragging" the left leg. He denies any unusual headaches. No vision change. He has had no further falls. His wife notes a significant decline in his memory over the past month.  Objective:  Vital signs in last 24 hours:  Blood pressure 126/71, pulse 84, temperature 98 F (36.7 C), temperature source Oral, resp. rate 18, height 6' 3.25" (1.911 m), weight 186 lb 8 oz (84.596 kg), SpO2 100 %.    HEENT: PERRLA; extraocular movements intact. Sclera anicteric. Oropharynx is without thrush. Resp: Lungs clear bilaterally. Cardio: Regular rate and rhythm. GI: Abdomen soft and nontender. No hepatomegaly. Vascular: Question minimal edema left lower leg/foot. Soft and nontender. Neuro: Alert and oriented. Follows commands. He has mild weakness at the proximal left leg and left foot with dorsiflexion. Strength otherwise is intact in the upper and lower extremities.  Skin: No rash.  Lab Results:  Lab Results  Component Value Date   WBC 6.2 09/11/2014   HGB 11.2* 09/11/2014   HCT 35.9* 09/11/2014   MCV 88.9 09/11/2014   PLT 344 09/11/2014   NEUTROABS 4.7 09/11/2014    Imaging:  No results found.  Medications: I have reviewed the patient's current medications.  Assessment/Plan: 1. 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, status post resection 04/06/2014  Staging CTs of the chest, abdomen, and pelvis 04/12/2014 confirmed a prostate mass  Prostate ultrasound 04/16/2014 confirmed a prostate mass with a biopsy consistent with small cell carcinoma  Extrinsic appearing cecal mass confirmed  on colonoscopy 04/19/2014 with a biopsy revealing small cell carcinoma  Whole brain and prostate radiation 04/24/2014 through 05/16/2014  Cycle 1 carboplatin/etoposide beginning 05/22/2014 with Neulasta support  Cycle 2 carboplatin/etoposide 06/19/2014  Cycle 3 carboplatin/etoposide 07/17/2014  Restaging CT scans 08/10/2014 with near-complete resolution of the large mass involving the prostate gland; resolution of inferior perirectal and right ileocolonic mesenteric lymph nodes; progressive right internal iliac/presacral adenopathy and new small bilateral adrenal nodules; persistent soft tissue nodule involving the medial wall of the cecum.  Cycle 4 carboplatin/etoposide 08/14/2014 2. Left frontal meningioma, WHO grade 1, status post resection 04/06/2014 3. Left leg weakness secondary to #1 4. 1 cmbrain lesion at the inferior falx noted on the MRI 03/21/2014, not resected 5. Diabetes  6. Hypertension  7. Hyperlipidemia 8. Neutropenia following cycle 1 carboplatin/etoposide.   Disposition: Ivan Stark has completed 4 cycles of carboplatin/etoposide. He has had a generalized decline in his condition over the past several weeks. In addition his wife has noted progressive left leg weakness and a significant decline in his memory. We are holding chemotherapy this week and referring him for an MRI of the brain. We will see him in follow-up on 09/17/2014.  Patient seen with Dr. Benay Spice. 25 minutes were spent face-to-face at today's visit with the majority of that time involved in counseling/coordination of care.  Ned Card ANP/GNP-BC   09/11/2014  11:45 AM  This was a shared visit with Ned Card. Ivan Stark was interviewed and examined. Ivan Stark has progressive neurologic symptoms. We decided to hold chemotherapy today and refer him for a restaging brain MRI. He will return for an office  visit after the MRI scan.   [name], M.D.

## 2014-09-11 NOTE — Telephone Encounter (Signed)
Called patient's wife with new schedule.  Patient's wife verbalized understanding.

## 2014-09-11 NOTE — Telephone Encounter (Signed)
Per staff message and POF I have scheduled appts. Advised scheduler of appts. JMW  

## 2014-09-11 NOTE — Telephone Encounter (Signed)
Gave avs & calendar for April. Sent message to schedule treatment. °

## 2014-09-12 ENCOUNTER — Ambulatory Visit: Payer: Medicare Other

## 2014-09-12 ENCOUNTER — Telehealth: Payer: Self-pay | Admitting: Nurse Practitioner

## 2014-09-12 NOTE — Telephone Encounter (Signed)
Pt's wife called to confirm labs/ov per phone call she was a little concerned with the times and wanted to make sure she had it correct. Confirmed labs for 04/18 with chemo per NP/LT and office visit with NP/LT on the 15th and also sending message through my chart where pt will confirm... KJ

## 2014-09-13 ENCOUNTER — Telehealth: Payer: Self-pay | Admitting: *Deleted

## 2014-09-13 ENCOUNTER — Ambulatory Visit: Payer: Medicare Other

## 2014-09-13 NOTE — Telephone Encounter (Signed)
Called [facility] on 09/11/13 to schedule a sooner appointment for MRI.  Per Dr. Benay Spice.  [facility] stated they would have to call patient and set it up.  Called again today 09/13/14 to see if patient got scheduled for MRI.  Earliest appointment at [facility] is Monday 09/17/14. Will leave 4/12 as scheduled

## 2014-09-14 ENCOUNTER — Ambulatory Visit: Payer: Medicare Other

## 2014-09-17 ENCOUNTER — Emergency Department (HOSPITAL_COMMUNITY)
Admission: EM | Admit: 2014-09-17 | Discharge: 2014-09-17 | Disposition: A | Discharge status: Home / Self Care | Payer: Medicare Other | Attending: Emergency Medicine | Admitting: Emergency Medicine

## 2014-09-17 ENCOUNTER — Ambulatory Visit: Payer: Medicare Other | Admitting: Nurse Practitioner

## 2014-09-17 ENCOUNTER — Encounter (HOSPITAL_COMMUNITY): Payer: Self-pay

## 2014-09-17 DIAGNOSIS — Z7982 Long term (current) use of aspirin: Secondary | ICD-10-CM | POA: Insufficient documentation

## 2014-09-17 DIAGNOSIS — Z8546 Personal history of malignant neoplasm of prostate: Secondary | ICD-10-CM | POA: Insufficient documentation

## 2014-09-17 DIAGNOSIS — E559 Vitamin D deficiency, unspecified: Secondary | ICD-10-CM | POA: Insufficient documentation

## 2014-09-17 DIAGNOSIS — Z86010 Personal history of colonic polyps: Secondary | ICD-10-CM | POA: Insufficient documentation

## 2014-09-17 DIAGNOSIS — K5641 Fecal impaction: Secondary | ICD-10-CM | POA: Insufficient documentation

## 2014-09-17 DIAGNOSIS — E119 Type 2 diabetes mellitus without complications: Secondary | ICD-10-CM | POA: Diagnosis not present

## 2014-09-17 DIAGNOSIS — K5900 Constipation, unspecified: Secondary | ICD-10-CM | POA: Diagnosis present

## 2014-09-17 DIAGNOSIS — Z79899 Other long term (current) drug therapy: Secondary | ICD-10-CM | POA: Insufficient documentation

## 2014-09-17 DIAGNOSIS — I10 Essential (primary) hypertension: Secondary | ICD-10-CM | POA: Insufficient documentation

## 2014-09-17 LAB — CBC WITH DIFFERENTIAL/PLATELET
Basophils Absolute: 0.1 10*3/uL (ref 0.0–0.1)
Basophils Relative: 1 % (ref 0–1)
EOS ABS: 0 10*3/uL (ref 0.0–0.7)
EOS PCT: 0 % (ref 0–5)
HCT: 37.9 % — ABNORMAL LOW (ref 39.0–52.0)
Hemoglobin: 12.1 g/dL — ABNORMAL LOW (ref 13.0–17.0)
LYMPHS PCT: 17 % (ref 12–46)
Lymphs Abs: 1.2 10*3/uL (ref 0.7–4.0)
MCH: 27.9 pg (ref 26.0–34.0)
MCHC: 31.9 g/dL (ref 30.0–36.0)
MCV: 87.5 fL (ref 78.0–100.0)
MONOS PCT: 10 % (ref 3–12)
Monocytes Absolute: 0.7 10*3/uL (ref 0.1–1.0)
NEUTROS PCT: 72 % (ref 43–77)
Neutro Abs: 5.1 10*3/uL (ref 1.7–7.7)
PLATELETS: 308 10*3/uL (ref 150–400)
RBC: 4.33 MIL/uL (ref 4.22–5.81)
RDW: 18 % — AB (ref 11.5–15.5)
WBC: 7 10*3/uL (ref 4.0–10.5)

## 2014-09-17 LAB — COMPREHENSIVE METABOLIC PANEL
ALT: 13 U/L (ref 0–53)
ANION GAP: 10 (ref 5–15)
AST: 20 U/L (ref 0–37)
Albumin: 3.1 g/dL — ABNORMAL LOW (ref 3.5–5.2)
Alkaline Phosphatase: 71 U/L (ref 39–117)
BILIRUBIN TOTAL: 0.5 mg/dL (ref 0.3–1.2)
BUN: 11 mg/dL (ref 6–23)
CHLORIDE: 107 mmol/L (ref 96–112)
CO2: 22 mmol/L (ref 19–32)
Calcium: 9.1 mg/dL (ref 8.4–10.5)
Creatinine, Ser: 0.89 mg/dL (ref 0.50–1.35)
GFR calc Af Amer: >90 mL/min (ref >90)
GFR calc non Af Amer: 87 mL/min — ABNORMAL LOW (ref >90)
Glucose, Bld: 244 mg/dL — ABNORMAL HIGH (ref 70–99)
Potassium: 3.8 mmol/L (ref 3.5–5.1)
SODIUM: 139 mmol/L (ref 135–145)
Total Protein: 6 g/dL (ref 6.0–8.3)

## 2014-09-17 LAB — URINALYSIS, ROUTINE W REFLEX MICROSCOPIC
Bilirubin Urine: NEGATIVE
GLUCOSE, UA: NEGATIVE mg/dL
Hgb urine dipstick: NEGATIVE
Ketones, ur: NEGATIVE mg/dL
Leukocytes, UA: NEGATIVE
Nitrite: NEGATIVE
PH: 5 (ref 5.0–8.0)
Protein, ur: NEGATIVE mg/dL
SPECIFIC GRAVITY, URINE: 1.021 (ref 1.005–1.030)
Urobilinogen, UA: 0.2 mg/dL (ref 0.0–1.0)

## 2014-09-17 LAB — LIPASE, BLOOD: LIPASE: 37 U/L (ref 11–59)

## 2014-09-17 MED ORDER — MINERAL OIL RE ENEM
1.0000 | ENEMA | Freq: Once | RECTAL | Status: AC
  Administered 2014-09-17: 1 via RECTAL
  Filled 2014-09-17: qty 1

## 2014-09-17 MED ORDER — DOCUSATE SODIUM 100 MG PO CAPS: 100.0000 mg | ORAL_CAPSULE | Freq: Two times a day (BID) | ORAL | Status: DC

## 2014-09-17 NOTE — ED Notes (Signed)
Pt. Reports constipation starting today. LBM yesterday - states it was hard. Reports mild abdominal pain, patient relates it to the constipation. Denies N/V.

## 2014-09-17 NOTE — Discharge Instructions (Signed)

## 2014-09-17 NOTE — ED Notes (Signed)
Patient had large BM in bedside commode. Dr. Ralene Bathe aware.

## 2014-09-17 NOTE — ED Notes (Signed)
Pt. Reports hx of prostate cancer with metastasis to brain - schedule for MRI tomorrow.

## 2014-09-17 NOTE — ED Provider Notes (Addendum)
CSN: [id number]     Arrival date & time 09/17/14  1131 History   First MD Initiated Contact with Patient 09/17/14 1422     Chief Complaint  Patient presents with  . Constipation     The history is provided by the patient. No language interpreter was used.   [name] presents for evaluation of constipation. He states that his last bowel movement was yesterday. Today he tried to have a bowel movement but felt that it was hard and there is formed stool stuck in his rectum. He denies any fevers, vomiting, abdominal pain, and dysuria or difficulty urinating. He has a history of constipation. Symptoms are moderate and constant. He currently takes [place] and MiraLAX daily.  Past Medical History  Diagnosis Date  . Hypertension   . Hyperlipidemia   . GERD (gastroesophageal reflux disease)   . Vitamin D deficiency   . History of colon polyps   . Complication of anesthesia     Difficult to wake up; and severe nausea and vomiting  . PONV (postoperative nausea and vomiting)   . Diabetes mellitus without complication     metformin  . Cancer 03/2014    prostate ca   Past Surgical History  Procedure Laterality Date  . Tonsillectomy    . Colonoscopy w/ polypectomy    . Esophagus surgery      Had esophagus stretched due to food getting trapped in his throat  . Craniotomy Bilateral 04/06/2014    Procedure: Bilateral Fronto-parietal craniotomy for resection of tumors with Curve;  Surgeon: Consuella Lose, MD;  Location: [facility];  Service: Neurosurgery;  Laterality: Bilateral;  Bilateral Fronto-parietal craniotomy for resection of tumors with Curve  . Colonoscopy N/A 04/19/2014    Procedure: COLONOSCOPY;  Surgeon: Winfield Cunas., MD;  Location: [facility] ENDOSCOPY;  Service: Endoscopy;  Laterality: N/A;   Family History  Problem Relation Age of Onset  . Goiter Mother   . Hypertension Father   . Cancer Father     colon   History  Substance Use Topics  . Smoking status: Never Smoker    . Smokeless tobacco: Not on file  . Alcohol Use: No    Review of Systems  All other systems reviewed and are negative.     Allergies  Atenolol and Lipitor  Home Medications   Prior to Admission medications   Medication Sig Start Date End Date Taking? Authorizing Provider  Amino Acids-Protein Hydrolys (FEEDING SUPPLEMENT, PRO-STAT SUGAR FREE 64,) LIQD Take 30 mLs by mouth 3 (three) times daily with meals. 04/27/14  Yes Ivan Anchors Love, PA-C  Ascorbic Acid (VITAMIN C) 1000 MG tablet Take 1,000 mg by mouth daily.   Yes Historical Provider, MD  aspirin EC 81 MG tablet Take 81 mg by mouth daily.   Yes Historical Provider, MD  calcium-vitamin D (OSCAL WITH D) 500-200 MG-UNIT per tablet Take 1 tablet by mouth 2 (two) times daily. 04/27/14  Yes Ivan Anchors Love, PA-C  Cholecalciferol (VITAMIN D) 2000 UNITS CAPS Take 4,000 Units by mouth daily.    Yes Historical Provider, MD  glimepiride (AMARYL) 4 MG tablet Take 0.5 tablets (2 mg total) by mouth 2 (two) times daily. 04/27/14  Yes Ivan Anchors Love, PA-C  HYDROcodone-acetaminophen (NORCO/VICODIN) 5-325 MG per tablet Take 1-2 tablets by mouth every 6 (six) hours as needed for moderate pain. 08/24/14  Yes Susanne Borders, NP  metFORMIN (GLUCOPHAGE-XR) 500 MG 24 hr tablet Take 1 tablet (500 mg total) by mouth 2 (two)  times daily with breakfast and lunch. Patient taking differently: Take 500 mg by mouth 2 (two) times daily with breakfast and lunch. Takes 1,000 mg with supper 04/27/14  Yes Ivan Anchors Love, PA-C  Multiple Vitamin (MULTIVITAMIN WITH MINERALS) TABS Take 1 tablet by mouth daily.   Yes Historical Provider, MD  OVER THE COUNTER MEDICATION Apply 1 application topically daily as needed (rash). Cream for rash on bottom   Yes Historical Provider, MD  ALPRAZolam (XANAX) 0.5 MG tablet Take 1 tablet (0.5 mg total) by mouth 2 (two) times daily as needed for anxiety. 04/27/14   Ivan Anchors Love, PA-C  polyethylene glycol (MIRALAX / GLYCOLAX) packet Take 60 g by  mouth 2 (two) times daily. Patient not taking: Reported on 09/17/2014 04/27/14   Ivan Anchors Love, PA-C  prochlorperazine (COMPAZINE) 10 MG tablet Take 1 tablet (10 mg total) by mouth every 6 (six) hours as needed for nausea or vomiting. Patient not taking: Reported on 09/17/2014 05/16/14   Ladell Pier, MD  senna (SENOKOT) 8.6 MG TABS tablet Take 2 tablets (17.2 mg total) by mouth 2 (two) times daily. For constipation. Adjust as  needed Patient not taking: Reported on 09/17/2014 04/27/14   Ivan Anchors Love, PA-C  simethicone (MYLICON) 80 MG chewable tablet Chew 1 tablet (80 mg total) by mouth 4 (four) times daily as needed for flatulence. Patient not taking: Reported on 09/17/2014 04/27/14   Ivan Anchors Love, PA-C   BP 123/77 mmHg  Pulse 86  Temp(Src) 97.8 F (36.6 C) (Oral)  Resp 16  SpO2 99% Physical Exam  Constitutional: He is oriented to person, place, and time. He appears well-developed and well-nourished.  HENT:  Head: Normocephalic and atraumatic.  Cardiovascular: Normal rate and regular rhythm.   No murmur heard. Pulmonary/Chest: Effort normal and breath sounds normal. No respiratory distress.  Abdominal: Soft. There is no tenderness. There is no rebound and no guarding.  Genitourinary:  Fecal impaction with brown stool.    Musculoskeletal: He exhibits no edema or tenderness.  Neurological: He is alert and oriented to person, place, and time.  Skin: Skin is warm and dry.  Psychiatric: He has a normal mood and affect. His behavior is normal.  Nursing note and vitals reviewed.   ED Course  Procedures (including critical care time)  Labs Review Labs Reviewed  CBC WITH DIFFERENTIAL/PLATELET - Abnormal; Notable for the following:    Hemoglobin 12.1 (*)    HCT 37.9 (*)    RDW 18.0 (*)    All other components within normal limits  COMPREHENSIVE METABOLIC PANEL - Abnormal; Notable for the following:    Glucose, Bld 244 (*)    Albumin 3.1 (*)    GFR calc non Af Amer 87 (*)    All  other components within normal limits  LIPASE, BLOOD  URINALYSIS, ROUTINE W REFLEX MICROSCOPIC    Imaging Review No results found.   EKG Interpretation None      MDM   Final diagnoses:  Fecal impaction    Patient here for evaluation of constipation. Patient is has been mainly disimpacted twice in the emergency department following disimpaction and enema patient had a large successful bowel movement and has improvement in the symptoms. Presentation is not consistent with bowel obstruction, prostatitis. Discussed with patient and care for constipation and PCP follow-up as well as return precautions.    Quintella Reichert, MD 09/17/14 [place], MD 09/17/14 [phone]

## 2014-09-17 NOTE — ED Notes (Signed)
This RN Presents for Disimpaction by Dr. Ralene Bathe. Pt tolerated well.

## 2014-09-18 ENCOUNTER — Ambulatory Visit: Payer: Medicare Other

## 2014-09-18 ENCOUNTER — Ambulatory Visit (HOSPITAL_COMMUNITY)
Admission: RE | Admit: 2014-09-18 | Discharge: 2014-09-18 | Disposition: A | Discharge status: Home / Self Care | Payer: Medicare Other | Source: Ambulatory Visit | Attending: Radiation Oncology | Admitting: Radiation Oncology

## 2014-09-18 DIAGNOSIS — Z9889 Other specified postprocedural states: Secondary | ICD-10-CM | POA: Insufficient documentation

## 2014-09-18 DIAGNOSIS — Z9221 Personal history of antineoplastic chemotherapy: Secondary | ICD-10-CM | POA: Insufficient documentation

## 2014-09-18 DIAGNOSIS — C7931 Secondary malignant neoplasm of brain: Secondary | ICD-10-CM | POA: Diagnosis not present

## 2014-09-18 DIAGNOSIS — C61 Malignant neoplasm of prostate: Secondary | ICD-10-CM | POA: Insufficient documentation

## 2014-09-18 DIAGNOSIS — Z923 Personal history of irradiation: Secondary | ICD-10-CM | POA: Insufficient documentation

## 2014-09-18 DIAGNOSIS — Z08 Encounter for follow-up examination after completed treatment for malignant neoplasm: Secondary | ICD-10-CM | POA: Diagnosis not present

## 2014-09-18 MED ORDER — GADOBENATE DIMEGLUMINE 529 MG/ML IV SOLN
20.0000 mL | Freq: Once | INTRAVENOUS | Status: AC | PRN
  Administered 2014-09-18: 17 mL via INTRAVENOUS

## 2014-09-19 ENCOUNTER — Ambulatory Visit: Payer: Medicare Other | Attending: Physical Medicine & Rehabilitation

## 2014-09-19 ENCOUNTER — Ambulatory Visit: Payer: Medicare Other

## 2014-09-19 DIAGNOSIS — G8194 Hemiplegia, unspecified affecting left nondominant side: Secondary | ICD-10-CM | POA: Insufficient documentation

## 2014-09-19 DIAGNOSIS — I129 Hypertensive chronic kidney disease with stage 1 through stage 4 chronic kidney disease, or unspecified chronic kidney disease: Secondary | ICD-10-CM | POA: Diagnosis not present

## 2014-09-19 DIAGNOSIS — E1122 Type 2 diabetes mellitus with diabetic chronic kidney disease: Secondary | ICD-10-CM | POA: Insufficient documentation

## 2014-09-19 DIAGNOSIS — N181 Chronic kidney disease, stage 1: Secondary | ICD-10-CM | POA: Insufficient documentation

## 2014-09-19 DIAGNOSIS — K219 Gastro-esophageal reflux disease without esophagitis: Secondary | ICD-10-CM | POA: Insufficient documentation

## 2014-09-19 DIAGNOSIS — C7931 Secondary malignant neoplasm of brain: Secondary | ICD-10-CM | POA: Insufficient documentation

## 2014-09-19 DIAGNOSIS — E559 Vitamin D deficiency, unspecified: Secondary | ICD-10-CM | POA: Diagnosis not present

## 2014-09-19 DIAGNOSIS — C61 Malignant neoplasm of prostate: Secondary | ICD-10-CM | POA: Insufficient documentation

## 2014-09-19 DIAGNOSIS — E785 Hyperlipidemia, unspecified: Secondary | ICD-10-CM | POA: Insufficient documentation

## 2014-09-19 DIAGNOSIS — R269 Unspecified abnormalities of gait and mobility: Secondary | ICD-10-CM | POA: Insufficient documentation

## 2014-09-19 NOTE — Patient Instructions (Signed)
Perform in corner with chair in front of you. Also, continue to perform all other balance exercises as prescribed.  Feet Together (Compliant Surface) Varied Arm Positions - Eyes Open   With eyes open, standing on compliant surface: ___pillow_____, feet together and arms at your side, look at a stationary object. Hold _30___ seconds. Repeat __3__ times per session. Do __1__ sessions per day.  Copyright  VHI. All rights reserved.

## 2014-09-19 NOTE — Therapy (Signed)
[facility] [address] [place], [place], [address] Phone: [phone]   Fax:  [phone]  Physical Therapy Treatment  Patient Details  Name: Ivan Stark MRN: [id number] Date of Birth: 1946-05-24 Referring Provider:  Unk Pinto, MD  Encounter Date: 09/19/2014      PT End of Session - 09/19/14 1158    Visit Number 12   Number of Visits 17   Date for PT Re-Evaluation 10/14/14  original POC 09/09/14.   Authorization Type G-code every 10th visit.   PT Start Time 1104   PT Stop Time 1145   PT Time Calculation (min) 41 min   Equipment Utilized During Treatment Gait belt   Activity Tolerance Patient tolerated treatment well   Behavior During Therapy WFL for tasks assessed/performed      Past Medical History  Diagnosis Date  . Hypertension   . Hyperlipidemia   . GERD (gastroesophageal reflux disease)   . Vitamin D deficiency   . History of colon polyps   . Complication of anesthesia     Difficult to wake up; and severe nausea and vomiting  . PONV (postoperative nausea and vomiting)   . Diabetes mellitus without complication     metformin  . Cancer 03/2014    prostate ca    Past Surgical History  Procedure Laterality Date  . Tonsillectomy    . Colonoscopy w/ polypectomy    . Esophagus surgery      Had esophagus stretched due to food getting trapped in his throat  . Craniotomy Bilateral 04/06/2014    Procedure: Bilateral Fronto-parietal craniotomy for resection of tumors with Curve;  Surgeon: Consuella Lose, MD;  Location: [facility];  Service: Neurosurgery;  Laterality: Bilateral;  Bilateral Fronto-parietal craniotomy for resection of tumors with Curve  . Colonoscopy N/A 04/19/2014    Procedure: COLONOSCOPY;  Surgeon: Winfield Cunas., MD;  Location: [facility] ENDOSCOPY;  Service: Endoscopy;  Laterality: N/A;    There were no vitals filed for this visit.  Visit Diagnosis:  Abnormality of gait      Subjective Assessment - 09/19/14 1109    Subjective Session started 4 minutes late, as pt was using restroom. Pt denied falls since last visit. Pt reported he was constipated and went to the hospital Monday (did not stay overnight as he had a BM). Pt's chemo treatment cancelled this week due to constipation.   Pertinent History Active prostate cancer with brain metastasis   Patient Stated Goals Walk without RW   Currently in Pain? No/denies      Neuro re-ed: Reviewed HEP. Balance activities performed in corner with chair in front of pt for safety; B LE's, 1-2 sets with 10-30 second holds: feet apart/together with and without head turns on compliant and non-compliant surface, feet together with eyes open/closed, modified tandem, single leg stance (with UE support). All with supervision to min guard. Pt experienced 3 LOB episodes.                 [facility] Adult PT Treatment/Exercise - 09/19/14 1111    Transfers   Transfers Sit to Stand;Stand to Sit   Sit to Stand 5: Supervision;With upper extremity assist   Sit to Stand Details (indicate cue type and reason) x5.    Stand to Sit 5: Supervision;4: Min assist;With upper extremity assist   Stand to Sit Details x5. Pt required min A to maintain balance during 2 LOB episodes. Cues for safety and technique.   Ambulation/Gait  Ambulation/Gait Yes   Ambulation/Gait Assistance 4: Min guard   Ambulation/Gait Assistance Details VCs to improve upright posture, L heel strike, R step length, and sequencing. 6 LOB episodes and min A required for five LOB episodes and pt self corrected one with stepping strategy.  Pt required seated rest breaks after each bout amb. 2/2 fatigue.   Ambulation Distance (Feet) --  117', 230', 250' with SPC and 29' with RW   Assistive device Straight cane;Rolling walker   Gait Pattern Step-through pattern;Decreased dorsiflexion - left;Left steppage;Right flexed knee in stance;Left flexed knee in stance;Decreased  trunk rotation;Decreased stride length;Decreased step length - right   Ambulation Surface Level;Indoor;Outdoor;Unlevel                PT Education - 09/19/14 1158    Education provided Yes   Education Details Reviewed balance HEP, as pt reported he has not been performing as prescribed. Provided progressed balance HEP.   Person(s) Educated Patient   Methods Explanation;Verbal cues;Handout   Comprehension Verbalized understanding;Returned demonstration          PT Short Term Goals - 08/08/14 0953    PT SHORT TERM GOAL #1   Title Pt will be independent in HEP to improve functional mobility. Target date: 08/08/14.   Status Partially Met   PT SHORT TERM GOAL #2   Title Asses BERG and write appropriate STG and LTG. Target date: 08/08/14.   Status Achieved   PT SHORT TERM GOAL #3   Title Pt will ambulated 300', with LRAD, over even/uneven terrain with supervision and no LOB to improve functional mobillity. Target date: 08/08/14.   Status Achieved   PT SHORT TERM GOAL #4   Title Pt will report zero falls in the last 4 weeks to improve safety during functional mobility. Target date: 08/08/14.   Baseline No falls in last 4 weeks.   Status Achieved   PT SHORT TERM GOAL #5   Title Pt will improve BERG balance score to >/=41/56 to decrease falls risk. Target date: 08/08/14.   Status Achieved           PT Long Term Goals - 09/06/14 1305    PT LONG TERM GOAL #1   Title Pt will verbalize understanding of falls prevention strategies to decrease falls risk. Target date: 09/05/14.   Baseline PT is requesting additonal 5 weeks of physical therapy: all unmet goals will be carried over to 10/14/14.   Status Achieved   PT LONG TERM GOAL #2   Title Pt will ambulate 600', with LRAD, over even/uneven terrain at MOD I level in order to improve functional mobility at home and at work. Target date: 10/14/14.   Status On-going   PT LONG TERM GOAL #3   Title Have pt complete FOTO and write appropriate  goal. Target date: 09/05/14.   Status Achieved   PT LONG TERM GOAL #4   Title Pt will perform sit<>stand x10 with no UE support to improve safety during functional mobility. Target date: 10/14/14.   Status On-going   PT LONG TERM GOAL #5   Title Pt will improve gait speed, with LRAD, to >/=2.67f/sec. to ambulate safely in the community. Target date: 10/14/14.   Baseline --  1.66 ft/sec   Status On-going   PT LONG TERM GOAL #6   Title Pt will improve BERG balance score to >/=45/56 to decrease falls risk. Target date: 10/14/14.   Status On-going   PT LONG TERM GOAL #7   Title Pt will improve  ABC score by 10% to improve quality of life. Target date: 10/14/14.   Status On-going               Plan - 09/19/14 1201    Clinical Impression Statement Pt did not have PT for 2 weeks, due to shedule conflicts. Pt's endurance, strength and balance was impaired as he required rest best and experienced decreased B heel strike and several LOB episodes. Pt's sequencing with SPC and upright posture improved during last 2 bouts of ambulation. Pt wold continue to benefit from skilled PT to improve safety during functional mobility.   Pt will benefit from skilled therapeutic intervention in order to improve on the following deficits Abnormal gait;Impaired flexibility;Decreased range of motion;Decreased endurance;Decreased knowledge of use of DME;Decreased balance;Decreased mobility;Decreased strength   Rehab Potential Good   PT Frequency 2x / week   PT Duration Other (comment)  13 weeks (original POC was 8 weeks)   PT Treatment/Interventions ADLs/Self Care Home Management;Gait training;Neuromuscular re-education;Biofeedback;Functional mobility training;Patient/family education;Therapeutic activities;Therapeutic exercise;Manual techniques;Balance training;DME Instruction   PT Next Visit Plan Assess strengthening HEP and progress as tolerated. Dynamic gait with SPC and dynamic balance.   PT Home Exercise Plan  Balance and strength HEP.    Consulted and Agree with Plan of Care Patient        Problem List Patient Active Problem List   Diagnosis Date Noted  . 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 Hypotension 05/22/2014  . Hypoalbuminemia 05/22/2014  . Small cell carcinoma of prostate   . Vision disturbance   . Cerebral meningioma   . Left hemiparesis 04/09/2014  . Brain metastases 04/06/2014  . Poor compliance with F/U 02/04/2014  . 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 Medication management 08/20/2013  . Hypertension   . Hyperlipidemia   . GERD (gastroesophageal reflux disease)   . Vitamin D deficiency   . History of colon polyps     Arvis Zwahlen L 09/19/2014, 12:08 PM  [facility] [address] [place], [place], [address] Phone: [phone]   Fax:  [phone]    Geoffry Paradise, PT,DPT 09/19/2014 12:08 PM Phone: [phone] Fax: [phone]

## 2014-09-20 ENCOUNTER — Encounter: Payer: Self-pay | Admitting: Radiation Oncology

## 2014-09-20 ENCOUNTER — Ambulatory Visit
Admission: RE | Admit: 2014-09-20 | Discharge: 2014-09-20 | Disposition: A | Discharge status: Home / Self Care | Payer: Medicare Other | Source: Ambulatory Visit | Attending: Radiation Oncology | Admitting: Radiation Oncology

## 2014-09-20 ENCOUNTER — Ambulatory Visit: Payer: Medicare Other

## 2014-09-20 VITALS — BP 133/82 | HR 81 | Resp 16 | Wt 184.5 lb

## 2014-09-20 DIAGNOSIS — C7931 Secondary malignant neoplasm of brain: Secondary | ICD-10-CM

## 2014-09-20 DIAGNOSIS — C61 Malignant neoplasm of prostate: Secondary | ICD-10-CM

## 2014-09-20 NOTE — Progress Notes (Signed)
Radiation Oncology         [phone] Name: Ivan Stark   Date: 09/20/2014   MRN: [id number]  DOB: Nov 29, 1945    Multidisciplinary [facility] Clinic Follow-Up Visit Note  CC: Alesia Richards, MD  Ladell Pier, MD    ICD-9-CM ICD-10-CM   1. 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 Battling constipation related to taking Vicodin "to relax him enough to sleep." Provided patient with constipation management handouts and reviewed information. Recently, patient presented to ED and had to be evacuated digitally. Both patient and his wife verbalized understanding. Denies headache or dizziness. Denies nausea or vomiting. Vitals stable. Wife concerned about slow steady decline of patient's weight. Reports overall appetite has improved but, not back to pre treatment state. Denies diplopia or ringing in the ears. Reports hearing has improved after visiting PCP. Reports driving short distances. Using walker to ambulate. Working with outpatient PT twice a week and hoping to transition to cane soon. Describes a strong steady urine stream. Denies dysuria or hematuria. Reports increased difficulty with short term memory.   ALLERGIES:  is allergic to atenolol and lipitor.  Meds: Current Outpatient Prescriptions  Medication Sig Dispense Refill  . ALPRAZolam (XANAX) 0.5 MG tablet Take 1 tablet (0.5 mg total) by mouth 2 (two) times daily as needed for anxiety. 30 tablet 0  . Ascorbic Acid (VITAMIN C) 1000 MG tablet Take 1,000 mg by mouth daily.    Marland Kitchen aspirin EC 81 MG tablet Take 81 mg by mouth daily.    . calcium-vitamin D (OSCAL WITH D)  500-200 MG-UNIT per tablet Take 1 tablet by mouth 2 (two) times daily. 60 tablet 1  . Cholecalciferol (VITAMIN D) 2000 UNITS CAPS Take 4,000 Units by mouth daily.     Marland Kitchen glimepiride (AMARYL) 4 MG tablet Take 0.5 tablets (2 mg total) by mouth 2 (two) times daily. 60 tablet 1  . GLUCERNA (GLUCERNA) LIQD Take 237 mLs by mouth.    Marland Kitchen HYDROcodone-acetaminophen (NORCO/VICODIN) 5-325 MG per tablet Take 1-2 tablets by mouth every 6 (six) hours as needed for moderate pain. 45 tablet 0  . metFORMIN (GLUCOPHAGE-XR) 500 MG 24 hr tablet Take 1 tablet (500 mg total) by mouth 2 (two) times daily with breakfast and lunch. (Patient taking differently: Take 500 mg by mouth 2 (two) times daily with breakfast and lunch. Takes 1,000 mg with supper) 60 tablet 1  . Multiple Vitamin (MULTIVITAMIN WITH MINERALS) TABS Take 1 tablet by mouth daily.    . polyethylene glycol (MIRALAX / GLYCOLAX) packet Take 60 g by mouth 2 (two) times daily. 14 each 0  . senna (SENOKOT) 8.6 MG TABS tablet Take 2 tablets (17.2 mg total) by mouth 2 (two) times daily. For constipation. Adjust as  needed 120 each 0  . simethicone (MYLICON) 80 MG chewable tablet Chew 1 tablet (80 mg total) by mouth 4 (four) times daily as needed for flatulence. 120 tablet 0  . Amino Acids-Protein Hydrolys (FEEDING SUPPLEMENT, PRO-STAT SUGAR FREE 64,) LIQD Take 30 mLs by mouth 3 (three) times daily with meals. (Patient not taking: Reported on 09/20/2014) 900 mL 1  . docusate sodium (  COLACE) 100 MG capsule Take 1 capsule (100 mg total) by mouth every 12 (twelve) hours. (Patient not taking: Reported on 09/20/2014) 10 capsule 0  . OVER THE COUNTER MEDICATION Apply 1 application topically daily as needed (rash). Cream for rash on bottom    . prochlorperazine (COMPAZINE) 10 MG tablet Take 1 tablet (10 mg total) by mouth every 6 (six) hours as needed for nausea or vomiting. (Patient not taking: Reported on 09/17/2014) 30 tablet 0   No current facility-administered medications for  this encounter.   Facility-Administered Medications Ordered in Other Encounters  Medication Dose Route Frequency Provider Last Rate Last Dose  . topical emolient (BIAFINE) emulsion   Topical BID Tyler Pita, MD        Physical Findings: The patient is in no acute distress. Patient is alert and oriented.  weight is 184 lb 8 oz (83.689 kg). His blood pressure is 133/82 and his pulse is 81. His respiration is 16 and oxygen saturation is 100%. .  No significant changes.  Lab Findings: Lab Results  Component Value Date   WBC 7.0 09/17/2014   HGB 12.1* 09/17/2014   HCT 37.9* 09/17/2014   MCV 87.5 09/17/2014   PLT 308 09/17/2014   Impression:  The patient is recovering from the effects of radiation.  He has experienced palliation of his rectal obstructive symptoms from his enlarged prostate. Difficulty with short term memory is a know side-effect of radiation therapy. It's is typically worse 4 months after treatment. His generalized weakness may also be a side effect of radiation and chemo.                        Plan:  Follow-up with rad/onc prn. Continue f/u with med/onc. Continue f/u with rehab to build strength.  This document serves as a record of services personally performed by Tyler Pita, MD. It was created on his behalf by Pearlie Oyster, a trained medical scribe. The creation of this record is based on the scribe's personal observations and the provider's statements to them. This document has been checked and approved by the attending provider.       _____________________________________  Sheral Apley. Tammi Klippel, M.D.

## 2014-09-20 NOTE — Progress Notes (Signed)
Battling constipation related to taking Vicodin "to relax him enough to sleep." Provided patient with constipation management handouts and reviewed information. Recently, patient presented to ED and had to be evacuated digitally.  Both patient and his wife verbalized understanding. Denies headache or dizziness. Denies nausea or vomiting. Vitals stable. Wife concerned about slow steady decline of patient's weight. Reports overall appetite has improved but, not back to pre treatment state. Denies diplopia or ringing in the ears. Reports hearing has improved after visiting PCP. Reports driving short distances. Using walker to ambulate. Working with outpatient PT twice a week and hoping to transition to cane soon. Describes a strong steady urine stream. Denies dysuria or hematuria.

## 2014-09-21 ENCOUNTER — Ambulatory Visit: Payer: Medicare Other

## 2014-09-21 ENCOUNTER — Other Ambulatory Visit: Payer: Medicare Other

## 2014-09-21 ENCOUNTER — Telehealth: Payer: Self-pay | Admitting: Oncology

## 2014-09-21 ENCOUNTER — Ambulatory Visit (HOSPITAL_BASED_OUTPATIENT_CLINIC_OR_DEPARTMENT_OTHER): Payer: Medicare Other | Admitting: Nurse Practitioner

## 2014-09-21 VITALS — BP 124/72 | HR 87 | Temp 97.7°F | Resp 18 | Ht 75.0 in | Wt 186.5 lb

## 2014-09-21 DIAGNOSIS — C61 Malignant neoplasm of prostate: Secondary | ICD-10-CM | POA: Diagnosis not present

## 2014-09-21 DIAGNOSIS — C7931 Secondary malignant neoplasm of brain: Secondary | ICD-10-CM

## 2014-09-21 MED ORDER — ALPRAZOLAM 0.5 MG PO TABS: 0.5000 mg | ORAL_TABLET | Freq: Two times a day (BID) | ORAL | Status: DC | PRN

## 2014-09-21 NOTE — Telephone Encounter (Signed)
Gave wife avs report and appointments for April and May.

## 2014-09-21 NOTE — Progress Notes (Addendum)
[facility] OFFICE PROGRESS NOTE   Diagnosis:  Small cell carcinoma of the prostate  INTERVAL HISTORY:   Mr. Desroches returns as scheduled. He completed cycle 4 carboplatin/etoposide on 08/14/2014. Chemotherapy was held on 09/11/2014 and he was referred for an MRI of the brain. His wife notes that overall he is doing better. He seems stronger. She has noted improvement in his memory as well. She notes less problems with "dragging" the left leg. He denies nausea/vomiting. He notes less constipation since beginning Miralax. He reports a good appetite.  Objective:  Vital signs in last 24 hours:  Blood pressure 124/72, pulse 87, temperature 97.7 F (36.5 C), temperature source Oral, resp. rate 18, height 6\' 3"  (1.905 m), weight 186 lb 8 oz (84.596 kg), SpO2 100 %.    HEENT: No thrush or ulcers. Resp: Lungs clear bilaterally. Cardio: Regular rate and rhythm. GI: Abdomen soft and nontender. No hepatomegaly. Vascular: No leg edema. Neuro: Alert and oriented. Mild weakness at the left foot with dorsiflexion. Strength otherwise intact.  Skin: No rash.    Lab Results:  Lab Results  Component Value Date   WBC 7.0 09/17/2014   HGB 12.1* 09/17/2014   HCT 37.9* 09/17/2014   MCV 87.5 09/17/2014   PLT 308 09/17/2014   NEUTROABS 5.1 09/17/2014    Imaging:  No results found.  Medications: I have reviewed the patient's current medications.  Assessment/Plan: 1. 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, status post resection 04/06/2014  Staging CTs of the chest, abdomen, and pelvis 04/12/2014 confirmed a prostate mass  Prostate ultrasound 04/16/2014 confirmed a prostate mass with a biopsy consistent with small cell carcinoma  Extrinsic appearing cecal mass confirmed on colonoscopy 04/19/2014 with a biopsy revealing small cell carcinoma  Whole brain and prostate radiation 04/24/2014 through 05/16/2014  Cycle 1 carboplatin/etoposide beginning  05/22/2014 with Neulasta support  Cycle 2 carboplatin/etoposide 06/19/2014  Cycle 3 carboplatin/etoposide 07/17/2014  Restaging CT scans 08/10/2014 with near-complete resolution of the large mass involving the prostate gland; resolution of inferior perirectal and right ileocolonic mesenteric lymph nodes; progressive right internal iliac/presacral adenopathy and new small bilateral adrenal nodules; persistent soft tissue nodule involving the medial wall of the cecum.  Cycle 4 carboplatin/etoposide 08/14/2014  MRI brain 09/18/2014 with interval collapse of the bilateral posterior frontal resection cavities. Minimal enhancement at the right frontal resection site has decreased. Increased enhancement along the margins of the left frontal resection cavity favored to be postoperative. Small amount of new non-mass-like enhancement more posteriorly in the paramedian right frontoparietal region likely postoperative/post ischemic. Decreased size of 7 mm falcine mass. No evidence of new intracranial metastases. 2. Left frontal meningioma, WHO grade 1, status post resection 04/06/2014 3. Left leg weakness secondary to #1 4. 1 cmbrain lesion at the inferior falx noted on the MRI 03/21/2014, not resected 5. Diabetes  6. Hypertension  7. Hyperlipidemia 8. Neutropenia following cycle 1 carboplatin/etoposide.   Disposition: Mr. Blanchette appears improved. The brain MRI does not show evidence of disease progression. The plan is to proceed with cycle 5 carboplatin/etoposide as scheduled 09/24/2014. We will refer him for restaging CT scans a few weeks after cycle 5. He will return for a follow-up visit on 10/15/2014 to review the results. He will contact the office in the interim with any problems.  Patient seen with Dr. Benay Spice.   Ned Card ANP/GNP-BC   09/21/2014  12:59 PM This was a shared visit with Ned Card. The brain MRI shows no disease  progression. The plan is to proceed with another cycle of  chemotherapy 09/24/2014.  [name], M.D.

## 2014-09-24 ENCOUNTER — Ambulatory Visit (HOSPITAL_BASED_OUTPATIENT_CLINIC_OR_DEPARTMENT_OTHER): Payer: Medicare Other

## 2014-09-24 ENCOUNTER — Other Ambulatory Visit (HOSPITAL_BASED_OUTPATIENT_CLINIC_OR_DEPARTMENT_OTHER): Payer: Medicare Other

## 2014-09-24 VITALS — BP 134/82 | HR 73 | Temp 98.6°F | Resp 18

## 2014-09-24 DIAGNOSIS — C7931 Secondary malignant neoplasm of brain: Secondary | ICD-10-CM

## 2014-09-24 DIAGNOSIS — Z5111 Encounter for antineoplastic chemotherapy: Secondary | ICD-10-CM

## 2014-09-24 DIAGNOSIS — C61 Malignant neoplasm of prostate: Secondary | ICD-10-CM

## 2014-09-24 LAB — COMPREHENSIVE METABOLIC PANEL (CC13)
ALT: 9 U/L (ref 0–55)
ANION GAP: 12 meq/L — AB (ref 3–11)
AST: 13 U/L (ref 5–34)
Albumin: 3.1 g/dL — ABNORMAL LOW (ref 3.5–5.0)
Alkaline Phosphatase: 75 U/L (ref 40–150)
BUN: 9.8 mg/dL (ref 7.0–26.0)
CALCIUM: 9.3 mg/dL (ref 8.4–10.4)
CO2: 23 mEq/L (ref 22–29)
CREATININE: 0.8 mg/dL (ref 0.7–1.3)
Chloride: 106 mEq/L (ref 98–109)
Glucose: 181 mg/dl — ABNORMAL HIGH (ref 70–140)
Potassium: 4 mEq/L (ref 3.5–5.1)
Sodium: 141 mEq/L (ref 136–145)
Total Bilirubin: 0.36 mg/dL (ref 0.20–1.20)
Total Protein: 6 g/dL — ABNORMAL LOW (ref 6.4–8.3)

## 2014-09-24 LAB — CBC WITH DIFFERENTIAL/PLATELET
BASO%: 1 % (ref 0.0–2.0)
BASOS ABS: 0.1 10*3/uL (ref 0.0–0.1)
EOS ABS: 0.1 10*3/uL (ref 0.0–0.5)
EOS%: 1.2 % (ref 0.0–7.0)
HEMATOCRIT: 38.7 % (ref 38.4–49.9)
HGB: 12.1 g/dL — ABNORMAL LOW (ref 13.0–17.1)
LYMPH%: 14 % (ref 14.0–49.0)
MCH: 27.2 pg (ref 27.2–33.4)
MCHC: 31.3 g/dL — ABNORMAL LOW (ref 32.0–36.0)
MCV: 87 fL (ref 79.3–98.0)
MONO#: 0.7 10*3/uL (ref 0.1–0.9)
MONO%: 9.2 % (ref 0.0–14.0)
NEUT%: 74.6 % (ref 39.0–75.0)
NEUTROS ABS: 5.4 10*3/uL (ref 1.5–6.5)
PLATELETS: 291 10*3/uL (ref 140–400)
RBC: 4.45 10*6/uL (ref 4.20–5.82)
RDW: 20 % — ABNORMAL HIGH (ref 11.0–14.6)
WBC: 7.3 10*3/uL (ref 4.0–10.3)
lymph#: 1 10*3/uL (ref 0.9–3.3)

## 2014-09-24 MED ORDER — SODIUM CHLORIDE 0.9 % IV SOLN
100.0000 mg/m2 | Freq: Once | INTRAVENOUS | Status: AC
  Administered 2014-09-24: 220 mg via INTRAVENOUS
  Filled 2014-09-24: qty 11

## 2014-09-24 MED ORDER — SODIUM CHLORIDE 0.9 % IV SOLN
Freq: Once | INTRAVENOUS | Status: AC
  Administered 2014-09-24: 14:00:00 via INTRAVENOUS

## 2014-09-24 MED ORDER — SODIUM CHLORIDE 0.9 % IV SOLN
Freq: Once | INTRAVENOUS | Status: AC
  Administered 2014-09-24: 14:00:00 via INTRAVENOUS
  Filled 2014-09-24: qty 8

## 2014-09-24 MED ORDER — SODIUM CHLORIDE 0.9 % IV SOLN
570.0000 mg | Freq: Once | INTRAVENOUS | Status: AC
  Administered 2014-09-24: 570 mg via INTRAVENOUS
  Filled 2014-09-24: qty 57

## 2014-09-24 NOTE — Patient Instructions (Signed)
[facility] Discharge Instructions for Patients Receiving Chemotherapy  Today you received the following chemotherapy agents:  Carboplatin/Etoposide  To help prevent nausea and vomiting after your treatment, we encourage you to take your nausea medication as ordered per MD.   If you develop nausea and vomiting that is not controlled by your nausea medication, call the clinic.   BELOW ARE SYMPTOMS THAT SHOULD BE REPORTED IMMEDIATELY:  *FEVER GREATER THAN 100.5 F  *CHILLS WITH OR WITHOUT FEVER  NAUSEA AND VOMITING THAT IS NOT CONTROLLED WITH YOUR NAUSEA MEDICATION  *UNUSUAL SHORTNESS OF BREATH  *UNUSUAL BRUISING OR BLEEDING  TENDERNESS IN MOUTH AND THROAT WITH OR WITHOUT PRESENCE OF ULCERS  *URINARY PROBLEMS  *BOWEL PROBLEMS  UNUSUAL RASH Items with * indicate a potential emergency and should be followed up as soon as possible.  Feel free to call the clinic you have any questions or concerns. The clinic phone number is [phone].  Please show the [facility] at check-in to the Emergency Department and triage nurse.

## 2014-09-25 ENCOUNTER — Other Ambulatory Visit: Payer: Self-pay

## 2014-09-25 ENCOUNTER — Observation Stay (HOSPITAL_COMMUNITY)
Admission: EM | Admit: 2014-09-25 | Discharge: 2014-09-26 | Disposition: A | Discharge status: Home / Self Care | Payer: Medicare Other | Attending: Internal Medicine | Admitting: Internal Medicine

## 2014-09-25 ENCOUNTER — Other Ambulatory Visit: Payer: Self-pay | Admitting: Physical Medicine and Rehabilitation

## 2014-09-25 ENCOUNTER — Encounter (HOSPITAL_COMMUNITY): Payer: Self-pay | Admitting: Neurology

## 2014-09-25 ENCOUNTER — Emergency Department (HOSPITAL_COMMUNITY): Payer: Medicare Other

## 2014-09-25 ENCOUNTER — Ambulatory Visit (HOSPITAL_BASED_OUTPATIENT_CLINIC_OR_DEPARTMENT_OTHER): Payer: Medicare Other

## 2014-09-25 ENCOUNTER — Other Ambulatory Visit (HOSPITAL_COMMUNITY): Payer: Self-pay

## 2014-09-25 VITALS — BP 105/57 | HR 71 | Temp 98.5°F | Resp 18

## 2014-09-25 DIAGNOSIS — C7931 Secondary malignant neoplasm of brain: Secondary | ICD-10-CM

## 2014-09-25 DIAGNOSIS — C61 Malignant neoplasm of prostate: Secondary | ICD-10-CM | POA: Diagnosis present

## 2014-09-25 DIAGNOSIS — K219 Gastro-esophageal reflux disease without esophagitis: Secondary | ICD-10-CM | POA: Insufficient documentation

## 2014-09-25 DIAGNOSIS — G459 Transient cerebral ischemic attack, unspecified: Secondary | ICD-10-CM | POA: Diagnosis not present

## 2014-09-25 DIAGNOSIS — Z800 Family history of malignant neoplasm of digestive organs: Secondary | ICD-10-CM | POA: Insufficient documentation

## 2014-09-25 DIAGNOSIS — Z86010 Personal history of colonic polyps: Secondary | ICD-10-CM | POA: Insufficient documentation

## 2014-09-25 DIAGNOSIS — I10 Essential (primary) hypertension: Secondary | ICD-10-CM | POA: Diagnosis not present

## 2014-09-25 DIAGNOSIS — R208 Other disturbances of skin sensation: Secondary | ICD-10-CM | POA: Diagnosis not present

## 2014-09-25 DIAGNOSIS — E785 Hyperlipidemia, unspecified: Secondary | ICD-10-CM | POA: Diagnosis not present

## 2014-09-25 DIAGNOSIS — I639 Cerebral infarction, unspecified: Secondary | ICD-10-CM | POA: Diagnosis present

## 2014-09-25 DIAGNOSIS — R200 Anesthesia of skin: Secondary | ICD-10-CM | POA: Diagnosis present

## 2014-09-25 DIAGNOSIS — Z5111 Encounter for antineoplastic chemotherapy: Secondary | ICD-10-CM

## 2014-09-25 DIAGNOSIS — Z7982 Long term (current) use of aspirin: Secondary | ICD-10-CM | POA: Insufficient documentation

## 2014-09-25 DIAGNOSIS — E119 Type 2 diabetes mellitus without complications: Secondary | ICD-10-CM | POA: Diagnosis not present

## 2014-09-25 DIAGNOSIS — Z8546 Personal history of malignant neoplasm of prostate: Secondary | ICD-10-CM | POA: Diagnosis not present

## 2014-09-25 DIAGNOSIS — E559 Vitamin D deficiency, unspecified: Secondary | ICD-10-CM | POA: Insufficient documentation

## 2014-09-25 LAB — CBC
HCT: 35.2 % — ABNORMAL LOW (ref 39.0–52.0)
HEMOGLOBIN: 11.2 g/dL — AB (ref 13.0–17.0)
MCH: 27.7 pg (ref 26.0–34.0)
MCHC: 31.8 g/dL (ref 30.0–36.0)
MCV: 87.1 fL (ref 78.0–100.0)
Platelets: 290 10*3/uL (ref 150–400)
RBC: 4.04 MIL/uL — ABNORMAL LOW (ref 4.22–5.81)
RDW: 18.1 % — ABNORMAL HIGH (ref 11.5–15.5)
WBC: 7.6 10*3/uL (ref 4.0–10.5)

## 2014-09-25 LAB — I-STAT TROPONIN, ED: TROPONIN I, POC: 0 ng/mL (ref 0.00–0.08)

## 2014-09-25 LAB — COMPREHENSIVE METABOLIC PANEL
ALBUMIN: 3.1 g/dL — AB (ref 3.5–5.2)
ALT: 13 U/L (ref 0–53)
AST: 21 U/L (ref 0–37)
Alkaline Phosphatase: 64 U/L (ref 39–117)
Anion gap: 11 (ref 5–15)
BUN: 17 mg/dL (ref 6–23)
CALCIUM: 8.7 mg/dL (ref 8.4–10.5)
CO2: 22 mmol/L (ref 19–32)
CREATININE: 1.01 mg/dL (ref 0.50–1.35)
Chloride: 108 mmol/L (ref 96–112)
GFR calc Af Amer: 87 mL/min — ABNORMAL LOW (ref >90)
GFR calc non Af Amer: 75 mL/min — ABNORMAL LOW (ref >90)
Glucose, Bld: 71 mg/dL (ref 70–99)
Potassium: 3.5 mmol/L (ref 3.5–5.1)
Sodium: 141 mmol/L (ref 135–145)
Total Bilirubin: 0.7 mg/dL (ref 0.3–1.2)
Total Protein: 5.8 g/dL — ABNORMAL LOW (ref 6.0–8.3)

## 2014-09-25 LAB — I-STAT CHEM 8, ED
BUN: 21 mg/dL (ref 6–23)
CHLORIDE: 110 mmol/L (ref 96–112)
Calcium, Ion: 1.11 mmol/L — ABNORMAL LOW (ref 1.13–1.30)
Creatinine, Ser: 0.9 mg/dL (ref 0.50–1.35)
Glucose, Bld: 70 mg/dL (ref 70–99)
HCT: 37 % — ABNORMAL LOW (ref 39.0–52.0)
HEMOGLOBIN: 12.6 g/dL — AB (ref 13.0–17.0)
Potassium: 3.6 mmol/L (ref 3.5–5.1)
SODIUM: 142 mmol/L (ref 135–145)
TCO2: 18 mmol/L (ref 0–100)

## 2014-09-25 LAB — ETHANOL: Alcohol, Ethyl (B): <5 mg/dL (ref 0–9)

## 2014-09-25 LAB — DIFFERENTIAL
BASOS PCT: 0 % (ref 0–1)
Basophils Absolute: 0 10*3/uL (ref 0.0–0.1)
Eosinophils Absolute: 0.1 10*3/uL (ref 0.0–0.7)
Eosinophils Relative: 2 % (ref 0–5)
Lymphocytes Relative: 19 % (ref 12–46)
Lymphs Abs: 1.4 10*3/uL (ref 0.7–4.0)
MONOS PCT: 12 % (ref 3–12)
Monocytes Absolute: 0.9 10*3/uL (ref 0.1–1.0)
NEUTROS ABS: 5.1 10*3/uL (ref 1.7–7.7)
Neutrophils Relative %: 67 % (ref 43–77)

## 2014-09-25 LAB — PROTIME-INR
INR: 1.02 (ref 0.00–1.49)
PROTHROMBIN TIME: 13.5 s (ref 11.6–15.2)

## 2014-09-25 LAB — APTT: aPTT: 28 seconds (ref 24–37)

## 2014-09-25 MED ORDER — ASPIRIN EC 81 MG PO TBEC
81.0000 mg | DELAYED_RELEASE_TABLET | Freq: Every day | ORAL | Status: DC
  Administered 2014-09-26: 81 mg via ORAL
  Filled 2014-09-25: qty 1

## 2014-09-25 MED ORDER — SODIUM CHLORIDE 0.9 % IJ SOLN
3.0000 mL | Freq: Two times a day (BID) | INTRAMUSCULAR | Status: DC
  Administered 2014-09-25 – 2014-09-26 (×2): 3 mL via INTRAVENOUS

## 2014-09-25 MED ORDER — SODIUM CHLORIDE 0.9 % IV SOLN
Freq: Once | INTRAVENOUS | Status: AC
  Administered 2014-09-25: 14:00:00 via INTRAVENOUS

## 2014-09-25 MED ORDER — SODIUM CHLORIDE 0.9 % IV SOLN
100.0000 mg/m2 | Freq: Once | INTRAVENOUS | Status: AC
  Administered 2014-09-25: 220 mg via INTRAVENOUS
  Filled 2014-09-25: qty 11

## 2014-09-25 MED ORDER — STROKE: EARLY STAGES OF RECOVERY BOOK: Freq: Once | Status: DC

## 2014-09-25 MED ORDER — SODIUM CHLORIDE 0.9 % IV SOLN: 250.0000 mL | INTRAVENOUS | Status: DC | PRN

## 2014-09-25 MED ORDER — SODIUM CHLORIDE 0.9 % IV SOLN
Freq: Once | INTRAVENOUS | Status: AC
  Administered 2014-09-25: 14:00:00 via INTRAVENOUS
  Filled 2014-09-25: qty 4

## 2014-09-25 MED ORDER — SODIUM CHLORIDE 0.9 % IJ SOLN: 3.0000 mL | INTRAMUSCULAR | Status: DC | PRN

## 2014-09-25 NOTE — ED Notes (Signed)
Pt reports today at 1800 was standing in line at [employer] developed left arm numbness, reports is still there but has improved. Just got chemo infusion this afternoon. Has equal grips, no arm drift. At current feels left arm is tingling. Speech clear, no facial droop.

## 2014-09-25 NOTE — ED Notes (Signed)
Pt expressing concerns concerning possible need for MRI; pt request PCP be notified if MRI is needed.

## 2014-09-25 NOTE — ED Provider Notes (Signed)
CSN: [id number]     Arrival date & time 09/25/14  1810 History   First MD Initiated Contact with Patient 09/25/14 1822     Chief Complaint  Patient presents with  . Numbness     (Consider location/radiation/quality/duration/timing/severity/associated sxs/prior Treatment) HPI 69 year old male presents with transient left arm numbness and weakness. This started while he was in the car in the [place]. Last around 20 minutes. He chronically has fingertip numbness in both hands that comes when he has chemotherapy, most recently had chemotherapy today. However this is a different numbness and weakness in his arm. No headaches. No leg symptoms. 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 He had a frontoparietal craniotomy in 2015. Since that he's had some residual weakness that is giving stronger with physical therapy. However the weakness today was definitely new. An MRI last week that showed no acute or new pathology. Currently feels back to his baseline.  Past Medical History  Diagnosis Date  . Hypertension   . Hyperlipidemia   . GERD (gastroesophageal reflux disease)   . Vitamin D deficiency   . History of colon polyps   . Complication of anesthesia     Difficult to wake up; and severe nausea and vomiting  . PONV (postoperative nausea and vomiting)   . Diabetes mellitus without complication     metformin  . Cancer 03/2014    prostate ca   Past Surgical History  Procedure Laterality Date  . Tonsillectomy    . Colonoscopy w/ polypectomy    . Esophagus surgery      Had esophagus stretched due to food getting trapped in his throat  . Craniotomy Bilateral 04/06/2014    Procedure: Bilateral Fronto-parietal craniotomy for resection of tumors with Curve;  Surgeon: Consuella Lose, MD;  Location: [facility];  Service: Neurosurgery;  Laterality: Bilateral;  Bilateral Fronto-parietal craniotomy for resection of tumors with Curve   . Colonoscopy N/A 04/19/2014    Procedure: COLONOSCOPY;  Surgeon: Winfield Cunas., MD;  Location: [facility] ENDOSCOPY;  Service: Endoscopy;  Laterality: N/A;   Family History  Problem Relation Age of Onset  . Goiter Mother   . Hypertension Father   . Cancer Father     colon   History  Substance Use Topics  . Smoking status: Never Smoker   . Smokeless tobacco: Not on file  . Alcohol Use: No    Review of Systems  Respiratory: Negative for shortness of breath.   Cardiovascular: Negative for chest pain.  Gastrointestinal: Negative for vomiting.  Neurological: Positive for weakness and numbness. Negative for headaches.  All other systems reviewed and are negative.     Allergies  Atenolol and Lipitor  Home Medications   Prior to Admission medications   Medication Sig Start Date End Date Taking? Authorizing Provider  ALPRAZolam Duanne Moron) 0.5 MG tablet Take 1 tablet (0.5 mg total) by mouth 2 (two) times daily as needed for anxiety. 09/21/14   Owens Shark, NP  Amino Acids-Protein Hydrolys (FEEDING SUPPLEMENT, PRO-STAT SUGAR FREE 64,) LIQD Take 30 mLs by mouth 3 (three) times daily with meals. Patient not taking: Reported on 09/20/2014 04/27/14   Ivan Anchors Love, PA-C  Ascorbic Acid (VITAMIN C) 1000 MG tablet Take 1,000 mg by mouth daily.    Historical Provider, MD  aspirin EC 81 MG tablet Take 81 mg by mouth daily.    Historical Provider, MD  calcium-vitamin D (OSCAL WITH D) 500-200 MG-UNIT  per tablet Take 1 tablet by mouth 2 (two) times daily. 04/27/14   Bary Leriche, PA-C  Cholecalciferol (VITAMIN D) 2000 UNITS CAPS Take 4,000 Units by mouth daily.     Historical Provider, MD  docusate sodium (COLACE) 100 MG capsule Take 1 capsule (100 mg total) by mouth every 12 (twelve) hours. 09/17/14   Quintella Reichert, MD  glimepiride (AMARYL) 4 MG tablet Take 0.5 tablets (2 mg total) by mouth 2 (two) times daily. 04/27/14   Ivan Anchors Love, PA-C  GLUCERNA (GLUCERNA) LIQD Take 237 mLs by mouth.     Historical Provider, MD  HYDROcodone-acetaminophen (NORCO/VICODIN) 5-325 MG per tablet Take 1-2 tablets by mouth every 6 (six) hours as needed for moderate pain. 08/24/14   Susanne Borders, NP  metFORMIN (GLUCOPHAGE-XR) 500 MG 24 hr tablet Take 1 tablet (500 mg total) by mouth 2 (two) times daily with breakfast and lunch. Patient taking differently: Take 500 mg by mouth 2 (two) times daily with breakfast and lunch. Takes 1,000 mg with supper 04/27/14   Ivan Anchors Love, PA-C  Multiple Vitamin (MULTIVITAMIN WITH MINERALS) TABS Take 1 tablet by mouth daily.    Historical Provider, MD  OVER THE COUNTER MEDICATION Apply 1 application topically daily as needed (rash). Cream for rash on bottom    Historical Provider, MD  polyethylene glycol (MIRALAX / GLYCOLAX) packet Take 60 g by mouth 2 (two) times daily. 04/27/14   Bary Leriche, PA-C  prochlorperazine (COMPAZINE) 10 MG tablet Take 1 tablet (10 mg total) by mouth every 6 (six) hours as needed for nausea or vomiting. Patient not taking: Reported on 09/21/2014 05/16/14   Ladell Pier, MD  senna (SENOKOT) 8.6 MG TABS tablet Take 2 tablets (17.2 mg total) by mouth 2 (two) times daily. For constipation. Adjust as  needed 04/27/14   Ivan Anchors Love, PA-C  simethicone (MYLICON) 80 MG chewable tablet Chew 1 tablet (80 mg total) by mouth 4 (four) times daily as needed for flatulence. 04/27/14   Ivan Anchors Love, PA-C   BP 127/72 mmHg  Pulse 82  Temp(Src) 97.7 F (36.5 C) (Oral)  Resp 15  SpO2 96% Physical Exam  Constitutional: He is oriented to person, place, and time. He appears well-developed and well-nourished.  HENT:  Head: Normocephalic and atraumatic.  Right Ear: External ear normal.  Left Ear: External ear normal.  Nose: Nose normal.  Eyes: EOM are normal. Pupils are equal, round, and reactive to light. Right eye exhibits no discharge. Left eye exhibits no discharge.  Neck: Neck supple.  Cardiovascular: Normal rate, regular rhythm, normal heart sounds  and intact distal pulses.   Pulmonary/Chest: Effort normal and breath sounds normal.  Abdominal: Soft. There is no tenderness.  Musculoskeletal: He exhibits no edema.  Neurological: He is alert and oriented to person, place, and time.  CN II-12 grossly intact. 5/5 strength in upper extremities. Right lower strandy slightly stronger than left lower extremity but both are 5/5. Grossly normal sensation.  Skin: Skin is warm and dry.  Nursing note and vitals reviewed.   ED Course  Procedures (including critical care time) Labs Review Labs Reviewed  CBC - Abnormal; Notable for the following:    RBC 4.04 (*)    Hemoglobin 11.2 (*)    HCT 35.2 (*)    RDW 18.1 (*)    All other components within normal limits  COMPREHENSIVE METABOLIC PANEL - Abnormal; Notable for the following:    Total Protein 5.8 (*)    Albumin  3.1 (*)    GFR calc non Af Amer 75 (*)    GFR calc Af Amer 87 (*)    All other components within normal limits  I-STAT CHEM 8, ED - Abnormal; Notable for the following:    Calcium, Ion 1.11 (*)    Hemoglobin 12.6 (*)    HCT 37.0 (*)    All other components within normal limits  ETHANOL  PROTIME-INR  APTT  DIFFERENTIAL  URINE RAPID DRUG SCREEN (HOSP PERFORMED)  URINALYSIS, ROUTINE W REFLEX MICROSCOPIC  HEMOGLOBIN A1C  LIPID PANEL  I-STAT TROPOININ, ED  I-STAT TROPOININ, ED    Imaging Review Ct Head Wo Contrast  09/25/2014   CLINICAL DATA:  Worsening left-sided numbness after chemotherapy. Metastatic prostate cancer. History of left frontal meningioma.  EXAM: CT HEAD WITHOUT CONTRAST  TECHNIQUE: Contiguous axial images were obtained from the base of the skull through the vertex without intravenous contrast.  COMPARISON:  MRI 09/18/2014.  CT 02/08/2014.  FINDINGS: No evidence of acute infarction or intracranial hemorrhage. The patient has had previous craniotomy at the vertex. There is cortical and subcortical encephalomalacia in that region. No mass identifiable by CT.  Chronic small-vessel changes of the white matter appear the same. No hydrocephalus. No extra-axial collection. No acute calvarial finding. Fluid throughout the mastoid air cells on the right. Small amount of fluid on the left.  IMPRESSION: No acute finding by CT. Previous craniotomy at the vertex. Underlying areas of encephalomalacia. Chronic small vessel change of the white matter. Fluid throughout the mastoid air cells, right more than left.   Electronically Signed   By: Nelson Chimes M.D.   On: 09/25/2014 19:58     EKG Interpretation None      MDM   Final diagnoses:  Left arm numbness    Patient with transient left arm numbness and weakness is concerning for a TIA. CT and lab work unremarkable. Current neuro exam benign. Discussed with Dr. Doy Mince of neurology who requests MRI but will also need admission for possible seizure workup as well given his complex cranial history. Patient is very hasn't get an MRI and request that we talk to his oncologist, Dr. Benay Spice who is not on call at this hour. I have discussed multiple times the importance of MRI given his new symptoms, will hold for now given he is declining. Admit for obs.    Sherwood Gambler, MD 09/25/14 [phone]

## 2014-09-25 NOTE — Patient Instructions (Addendum)
[place] Discharge Instructions for Patients Receiving Chemotherapy  Today you received the following chemotherapy agents Etoposide.   To help prevent nausea and vomiting after your treatment, we encourage you to take your nausea medication as directed.    If you develop nausea and vomiting that is not controlled by your nausea medication, call the clinic.   BELOW ARE SYMPTOMS THAT SHOULD BE REPORTED IMMEDIATELY:  *FEVER GREATER THAN 100.5 F  *CHILLS WITH OR WITHOUT FEVER  NAUSEA AND VOMITING THAT IS NOT CONTROLLED WITH YOUR NAUSEA MEDICATION  *UNUSUAL SHORTNESS OF BREATH  *UNUSUAL BRUISING OR BLEEDING  TENDERNESS IN MOUTH AND THROAT WITH OR WITHOUT PRESENCE OF ULCERS  *URINARY PROBLEMS  *BOWEL PROBLEMS  UNUSUAL RASH Items with * indicate a potential emergency and should be followed up as soon as possible.  Feel free to call the clinic you have any questions or concerns. The clinic phone number is [phone].  Please show the [facility] at check-in to the Emergency Department and triage nurse.  Etoposide, VP-16 capsules What is this medicine? ETOPOSIDE, VP-16 (e toe POE side) is a chemotherapy drug. It is used to treat small cell lung cancer and other cancers. This medicine may be used for other purposes; ask your health care provider or pharmacist if you have questions. COMMON BRAND NAME(S): VePesid What should I tell my health care provider before I take this medicine? They need to know if you have any of these conditions: -infection -kidney disease -low blood counts, like low white cell, platelet, or red cell counts -an unusual or allergic reaction to etoposide, other chemotherapeutic agents, other medicines, foods, dyes, or preservatives -pregnant or trying to get pregnant -breast-feeding How should I use this medicine? Take this medicine by mouth with a glass of water. Follow the directions on the prescription label. Do not open, crush,  or chew the capsules. It is advisable to wear gloves when handling this medicine. Take your medicine at regular intervals. Do not take it more often than directed. Do not stop taking except on your doctor's advice. Talk to your pediatrician regarding the use of this medicine in children. Special care may be needed. Overdosage: If you think you have taken too much of this medicine contact a poison control center or emergency room at once. NOTE: This medicine is only for you. Do not share this medicine with others. What if I miss a dose? If you miss a dose, take it as soon as you can. If it is almost time for your next dose, take only that dose. Do not take double or extra doses. What may interact with this medicine? -cyclosporine -medicines to increase blood counts like filgrastim, pegfilgrastim, sargramostim -vaccines This list may not describe all possible interactions. Give your health care provider a list of all the medicines, herbs, non-prescription drugs, or dietary supplements you use. Also tell them if you smoke, drink alcohol, or use illegal drugs. Some items may interact with your medicine. What should I watch for while using this medicine? Visit your doctor for checks on your progress. This drug may make you feel generally unwell. This is not uncommon, as chemotherapy can affect healthy cells as well as cancer cells. Report any side effects. Continue your course of treatment even though you feel ill unless your doctor tells you to stop. In some cases, you may be given additional medicines to help with side effects. Follow all directions for their use. Call your doctor or health care professional for  advice if you get a fever, chills or sore throat, or other symptoms of a cold or flu. Do not treat yourself. This drug decreases your body's ability to fight infections. Try to avoid being around people who are sick. This medicine may increase your risk to bruise or bleed. Call your doctor or  health care professional if you notice any unusual bleeding. Be careful brushing and flossing your teeth or using a toothpick because you may get an infection or bleed more easily. If you have any dental work done, tell your dentist you are receiving this medicine. Avoid taking products that contain aspirin, acetaminophen, ibuprofen, naproxen, or ketoprofen unless instructed by your doctor. These medicines may hide a fever. Do not become pregnant while taking this medicine. Women should inform their doctor if they wish to become pregnant or think they might be pregnant. There is a potential for serious side effects to an unborn child. Talk to your health care professional or pharmacist for more information. Do not breast-feed an infant while taking this medicine. What side effects may I notice from receiving this medicine? Side effects that you should report to your doctor or health care professional as soon as possible: -allergic reactions like skin rash, itching or hives, swelling of the face, lips, or tongue -low blood counts - this medicine may decrease the number of white blood cells, red blood cells and platelets. You may be at increased risk for infections and bleeding. -signs of infection - fever or chills, cough, sore throat, pain or difficulty passing urine -signs of decreased platelets or bleeding - bruising, pinpoint red spots on the skin, black, tarry stools, blood in the urine -signs of decreased red blood cells - unusually weak or tired, fainting spells, lightheadedness -breathing problems -changes in vision -mouth or throat sores or ulcers -pain, tingling, numbness in the hands or feet -redness, blistering, peeling or loosening of the skin, including inside the mouth -seizures -vomiting Side effects that usually do not require medical attention (report to your doctor or health care professional if they continue or are bothersome): -change in taste -diarrhea -hair  loss -nausea -stomach pain This list may not describe all possible side effects. Call your doctor for medical advice about side effects. You may report side effects to FDA at 1-800-FDA-1088. Where should I keep my medicine? Keep out of the reach of children. Store in a refrigerator between 2 and 8 degrees C (36 and 46 degrees F). Do not freeze. Throw away any unused medicine after the expiration date. NOTE: This sheet is a summary. It may not cover all possible information. If you have questions about this medicine, talk to your doctor, pharmacist, or health care provider.  2015, Elsevier/Gold Standard. (2007-09-26 17:22:30)

## 2014-09-25 NOTE — H&P (Signed)
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  His mental state remained normal throughout this, did not experience any facial drooping, slurred speech, n/t in his face, or symptoms in his leg.  He was still able to move his left arm.  They came to the ED, by the time he arrived to the ED, (approx 20 minutes ) his symptoms had totally resolved and have not reoccurred since.  No recent illnesses other than his cancer related issues, no n/v/d.  No fevers.  referrred for admission for possible TIA, neurology team has been called for consult also.  Review of Systems:  Positive and negative as per HPI otherwise all other systems are negative  Past Medical History: Past Medical History  Diagnosis Date  . Hypertension   . Hyperlipidemia   . GERD (gastroesophageal reflux disease)   . Vitamin D deficiency   . History of colon polyps   . Complication of anesthesia     Difficult to wake up; and severe nausea and vomiting  . PONV (postoperative nausea and vomiting)   . Diabetes mellitus without complication     metformin  . Cancer 03/2014    prostate ca   Past Surgical History  Procedure Laterality Date  . Tonsillectomy    . Colonoscopy w/ polypectomy    . Esophagus surgery      Had esophagus stretched due to food getting trapped in his throat  . Craniotomy Bilateral 04/06/2014    Procedure: Bilateral Fronto-parietal craniotomy for resection of tumors with Curve;  Surgeon: Consuella Lose, MD;  Location: [facility];  Service: Neurosurgery;  Laterality: Bilateral;  Bilateral Fronto-parietal craniotomy for resection of tumors with Curve   . Colonoscopy N/A 04/19/2014    Procedure: COLONOSCOPY;  Surgeon: Winfield Cunas., MD;  Location: [facility];  Service: Endoscopy;  Laterality: N/A;    Medications: Prior to Admission medications   Medication Sig Start Date End Date Taking? Authorizing Provider  Ascorbic Acid (VITAMIN C) 1000 MG tablet Take 1,000 mg by mouth daily.   Yes Historical Provider, MD  aspirin EC 81 MG tablet Take 81 mg by mouth daily.   Yes Historical Provider, MD  Cholecalciferol (VITAMIN D) 2000 UNITS CAPS Take 4,000 Units by mouth daily.    Yes Historical Provider, MD  glimepiride (AMARYL) 4 MG tablet Take 0.5 tablets (2 mg total) by mouth 2 (two) times daily. 04/27/14  Yes Ivan Anchors Love, PA-C  GLUCERNA (GLUCERNA) LIQD Take 237 mLs by mouth 2 (two) times a week.    Yes Historical Provider, MD  HYDROcodone-acetaminophen (NORCO/VICODIN) 5-325 MG per tablet Take 1-2 tablets by mouth every 6 (six) hours as needed for moderate pain. 08/24/14  Yes Susanne Borders, NP  metFORMIN (GLUCOPHAGE-XR) 500 MG 24 hr tablet Take 1 tablet (500 mg total) by mouth 2 (two) times daily with breakfast and lunch. Patient taking differently: Take 500-1,000 mg by mouth 3 (three) times daily. Takes 585m in am, 5019maround lunch, and 100057mt dinner 04/27/14  Yes PamIvan Anchorsve, PA-C  Multiple Vitamin (MULTIVITAMIN WITH MINERALS) TABS Take 1 tablet by mouth daily.  Yes Historical Provider, MD  polyethylene glycol (MIRALAX / GLYCOLAX) packet Take 60 g by mouth 2 (two) times daily. Patient taking differently: Take 60 g by mouth every morning.  04/27/14  Yes Ivan Anchors Love, PA-C  polyethylene glycol (MIRALAX / GLYCOLAX) packet Take 17 g by mouth daily.   Yes Historical Provider, MD  prochlorperazine (COMPAZINE) 10 MG tablet Take 1 tablet (10 mg total) by mouth every 6 (six) hours as needed for nausea or vomiting. 05/16/14  Yes Ladell Pier, MD  senna (SENOKOT) 8.6 MG TABS tablet Take 2 tablets (17.2 mg total) by mouth 2 (two) times  daily. For constipation. Adjust as  needed Patient taking differently: Take 1 tablet by mouth every evening. For constipation. Adjust as  needed 04/27/14  Yes Ivan Anchors Love, PA-C  ALPRAZolam Duanne Moron) 0.5 MG tablet Take 1 tablet (0.5 mg total) by mouth 2 (two) times daily as needed for anxiety. 09/21/14   Owens Shark, NP  Amino Acids-Protein Hydrolys (FEEDING SUPPLEMENT, PRO-STAT SUGAR FREE 64,) LIQD Take 30 mLs by mouth 3 (three) times daily with meals. Patient not taking: Reported on 09/20/2014 04/27/14   Ivan Anchors Love, PA-C  calcium-vitamin D (OSCAL WITH D) 500-200 MG-UNIT per tablet Take 1 tablet by mouth 2 (two) times daily. Patient taking differently: Take 2 tablets by mouth daily with breakfast.  04/27/14   Ivan Anchors Love, PA-C  docusate sodium (COLACE) 100 MG capsule Take 1 capsule (100 mg total) by mouth every 12 (twelve) hours. 09/17/14   Quintella Reichert, MD  simethicone (MYLICON) 80 MG chewable tablet Chew 1 tablet (80 mg total) by mouth 4 (four) times daily as needed for flatulence. 04/27/14   Bary Leriche, PA-C    Allergies:   Allergies  Allergen Reactions  . Atenolol Shortness Of Breath  . Lipitor [Atorvastatin]     Itch    Social History:  reports that he has never smoked. He does not have any smokeless tobacco history on file. He reports that he does not drink alcohol or use illicit drugs.  Family History: Family History  Problem Relation Age of Onset  . Goiter Mother   . Hypertension Father   . Cancer Father     colon    Physical Exam: Filed Vitals:   09/25/14 1816 09/25/14 1838 09/25/14 1942  BP: 127/72 123/68 126/73  Pulse: 82  66  Temp: 97.7 F (36.5 C)  97.9 F (36.6 C)  TempSrc: Oral    Resp: 15 11 11   SpO2: 96%  100%   General appearance: alert, cooperative and no distress Head: Normocephalic, without obvious abnormality, atraumatic Eyes: negative Nose: Nares normal. Septum midline. Mucosa normal. No drainage or sinus tenderness. Neck: no JVD and  supple, symmetrical, trachea midline Lungs: clear to auscultation bilaterally Heart: regular rate and rhythm, S1, S2 normal, no murmur, click, rub or gallop Abdomen: soft, non-tender; bowel sounds normal; no masses,  no organomegaly Extremities: extremities normal, atraumatic, no cyanosis or edema Pulses: 2+ and symmetric Skin: Skin color, texture, turgor normal. No rashes or lesions Neurologic: Grossly normal  Labs on Admission:   Recent Labs  09/24/14 1200 09/25/14 1840 09/25/14 1853  NA 141 141 142  K 4.0 3.5 3.6  CL  --  108 110  CO2 23 22  --   GLUCOSE 181* 71 70  BUN 9.8 17 21   CREATININE 0.8 1.01 0.90  CALCIUM 9.3 8.7  --     Recent Labs  09/24/14 1200 09/25/14 1840  AST  13 21  ALT 9 13  ALKPHOS 75 64  BILITOT 0.36 0.7  PROT 6.0* 5.8*  ALBUMIN 3.1* 3.1*    Recent Labs  09/24/14 1200 09/25/14 1840 09/25/14 1853  WBC 7.3 7.6  --   NEUTROABS 5.4 5.1  --   HGB 12.1* 11.2* 12.6*  HCT 38.7 35.2* 37.0*  MCV 87.0 87.1  --   PLT 291 290  --     Radiological Exams on Admission: Ct Head Wo Contrast  09/25/2014   CLINICAL DATA:  Worsening left-sided numbness after chemotherapy. Metastatic prostate cancer. History of left frontal meningioma.  EXAM: CT HEAD WITHOUT CONTRAST  TECHNIQUE: Contiguous axial images were obtained from the base of the skull through the vertex without intravenous contrast.  COMPARISON:  MRI 09/18/2014.  CT 02/08/2014.  FINDINGS: No evidence of acute infarction or intracranial hemorrhage. The patient has had previous craniotomy at the vertex. There is cortical and subcortical encephalomalacia in that region. No mass identifiable by CT. Chronic small-vessel changes of the white matter appear the same. No hydrocephalus. No extra-axial collection. No acute calvarial finding. Fluid throughout the mastoid air cells on the right. Small amount of fluid on the left.  IMPRESSION: No acute finding by CT. Previous craniotomy at the vertex. Underlying areas  of encephalomalacia. Chronic small vessel change of the white matter. Fluid throughout the mastoid air cells, right more than left.   Electronically Signed   By: Nelson Chimes M.D.   On: 09/25/2014 19:58   [name] KZ Contrast  09/19/2014   CLINICAL DATA:  Metastatic small-cell carcinoma of the prostate. Whole-brain radiation 11- 05/2014. Last chemotherapy in 07/2014. Right frontal brain metastasis resected 04/06/2014. Left frontal meningioma also resected at the same time.  EXAM: MRI HEAD WITHOUT AND WITH CONTRAST  TECHNIQUE: Multiplanar, multiecho pulse sequences of the brain and surrounding structures were obtained without and with intravenous contrast.  CONTRAST:  16m MULTIHANCE GADOBENATE DIMEGLUMINE 529 MG/ML IV SOLN  COMPARISON:  04/07/2014  FINDINGS: There is no acute infarct or midline shift. Sequelae of prior bilateral parietal craniotomy are again identified. Extra-axial fluid collection subjacent to the craniotomy has slightly decreased in size, measuring 4.3 x 1.0 cm. Bilateral posterior frontal resection cavities have collapsed in the interim. A small amount of chronic blood products remain at both resections sites. There is minimal residual non masslike enhancement at the posterior right frontal resection site, decreased from the prior study.  There is a small amount of new non masslike enhancement more posteriorly in the paramedian right frontoparietal region corresponding to an area of restricted diffusion on the postoperative MRI (series 11, image 97 and series 13, image 19). Enhancement at the posterior left frontal resection site has increased in the interim and predominantly consists of thickened enhancement along the margins of the cavity, measuring in total approximately 1.5 x 1.4 cm (series 13, image 24). This largely corresponds to restricted diffusion on the postoperative MRI. There is a small amount of edema or gliosis about both resection sites.  7 mm enhancing mass associated with  the falx more anteriorly has decreased in size (series 11, image 89 and series 13, image 32, previously approximately 1 cm). A small right frontal developmental venous anomaly is noted. Patchy and confluent T2 hyperintensities elsewhere in the subcortical and deep cerebral white matter bilaterally have mildly progressed from the prior MRI and likely reflect a combination of chronic small vessel ischemia and posttherapy changes. There is mild global cerebral atrophy.  Orbits are unremarkable. Polyp/mucous  retention cyst in the right maxillary sinus is unchanged. There are large bilateral mastoid effusions. Major intracranial vascular flow voids are preserved.  IMPRESSION: 1. Interval collapse of the bilateral posterior frontal resection cavities. Minimal enhancement at the right frontal resection site has decreased. There is increased enhancement along the margins of the left frontal resection cavity, favored to be postoperative although attention is recommended on follow-up. 2. Small amount of new non masslike enhancement more posteriorly in the paramedian right frontoparietal region, likely postoperative/post ischemic. 3. Decreased size of 7 mm falcine mass. 4. No evidence of new intracranial metastases.   Electronically Signed   By: Logan Bores   On: 09/19/2014 08:33    Assessment/Plan  69 yo male with acute brief episode of left arm/hand numbness and tingling likely TIA  Principal Problem:   TIA (transient ischemic attack)-  Pt does not wish to have mri, and a massive w/u for his possible TIA due to cost issues.  i have encouraged him to speak to the neurologist about his options (they may want an EEG) , it is for this reason i have not ordered a large w/u.   Pt understands that he very well may have suffered a cerebral infarct despite the "normal" mri he had last week for his cancer follow up.  Place on aspirin.  W/u will need to be determined by neurology team.  obs on tele bed.  eeg reviewed nsr no  acute issues.   Active Problems:  Stable unless o/w noted   Hypertension   Brain metastases   Small cell carcinoma of prostate   Left arm numbness  Full code.  Akin Yi A 09/25/2014, 9:02 PM

## 2014-09-25 NOTE — Progress Notes (Signed)
Pt. Does have stroke book pharmacy will order. Ruben Gottron, RN 09/25/2014 11:09 PM

## 2014-09-26 ENCOUNTER — Ambulatory Visit: Payer: Medicare Other

## 2014-09-26 ENCOUNTER — Observation Stay (HOSPITAL_COMMUNITY): Payer: Medicare Other

## 2014-09-26 ENCOUNTER — Other Ambulatory Visit: Payer: Self-pay | Admitting: *Deleted

## 2014-09-26 DIAGNOSIS — C7931 Secondary malignant neoplasm of brain: Secondary | ICD-10-CM | POA: Diagnosis not present

## 2014-09-26 DIAGNOSIS — G459 Transient cerebral ischemic attack, unspecified: Secondary | ICD-10-CM | POA: Diagnosis not present

## 2014-09-26 DIAGNOSIS — R208 Other disturbances of skin sensation: Secondary | ICD-10-CM | POA: Diagnosis not present

## 2014-09-26 LAB — LIPID PANEL
CHOL/HDL RATIO: 6.1 ratio
Cholesterol: 182 mg/dL (ref 0–200)
HDL: 30 mg/dL — ABNORMAL LOW (ref >39)
LDL CALC: 129 mg/dL — AB (ref 0–99)
TRIGLYCERIDES: 114 mg/dL (ref <150)
VLDL: 23 mg/dL (ref 0–40)

## 2014-09-26 LAB — URINALYSIS, ROUTINE W REFLEX MICROSCOPIC
Bilirubin Urine: NEGATIVE
Glucose, UA: NEGATIVE mg/dL
HGB URINE DIPSTICK: NEGATIVE
Ketones, ur: NEGATIVE mg/dL
Leukocytes, UA: NEGATIVE
NITRITE: NEGATIVE
Protein, ur: NEGATIVE mg/dL
SPECIFIC GRAVITY, URINE: 1.009 (ref 1.005–1.030)
UROBILINOGEN UA: 0.2 mg/dL (ref 0.0–1.0)
pH: 5 (ref 5.0–8.0)

## 2014-09-26 LAB — RAPID URINE DRUG SCREEN, HOSP PERFORMED
Amphetamines: NOT DETECTED
Barbiturates: NOT DETECTED
Benzodiazepines: NOT DETECTED
Cocaine: NOT DETECTED
OPIATES: NOT DETECTED
Tetrahydrocannabinol: NOT DETECTED

## 2014-09-26 MED ORDER — METFORMIN HCL ER 500 MG PO TB24: 500.0000 mg | ORAL_TABLET | Freq: Two times a day (BID) | ORAL | Status: DC

## 2014-09-26 MED ORDER — ASPIRIN 325 MG PO TBEC: 325.0000 mg | DELAYED_RELEASE_TABLET | Freq: Every day | ORAL | Status: DC

## 2014-09-26 NOTE — Procedures (Signed)
History: 69 yo M with left arm numbness  Sedation: None  Technique: This is a 17 channel routine scalp EEG performed at the bedside with bipolar and monopolar montages arranged in accordance to the international 10/20 system of electrode placement. One channel was dedicated to EKG recording.    Background: The background consists of intermixed alpha and beta activities. There is a well defined posterior dominant rhythm of 9 Hz that attenuates with eye opening. There is drowsiness and sleep recorded with normal distribution of structures.   Photic stimulation: Physiologic driving is present  EEG Abnormalities: none  Clinical Interpretation: This normal EEG is recorded in the waking and sleep state. There was no seizure or seizure predisposition recorded on this study.   Roland Rack, MD [facility] [phone]  If 7pm- 7am, please page neurology on call as listed in [place].

## 2014-09-26 NOTE — Evaluation (Signed)
Physical Therapy Evaluation Patient Details Name: Ivan Stark MRN: [id number] DOB: 1946-05-23 Today's Date: 09/26/2014   History of Present Illness  Patient is a 69 y/o male with hx of prostate cancer stage 4 w/ mets to brain s/p resection 10/15 of brain met, getting radiation and chemo presents with tingling of LUE which resolved upon getting to ED. Head CT- negative.    Clinical Impression  Patient presents close to functional baseline and able to ambulate community distances without LOB or difficulty. Balance improved with increased distance. Encourage use of RW for safety and to minimize fall risk. Pt has 24/7 S at home and does not require further skilled therapy services. Discharge from therapy.    Follow Up Recommendations No PT follow up;Supervision/Assistance - 24 hour    Equipment Recommendations  None recommended by PT    Recommendations for Other Services       Precautions / Restrictions Precautions Precautions: Fall Restrictions Weight Bearing Restrictions: No      Mobility  Bed Mobility Overal bed mobility: Modified Independent                Transfers Overall transfer level: Needs assistance Equipment used: None Transfers: Sit to/from Stand Sit to Stand: Supervision         General transfer comment: Supervision for safety, first time OOB.  Ambulation/Gait Ambulation/Gait assistance: Supervision Ambulation Distance (Feet): 150 Feet Assistive device: Rolling walker (2 wheeled) Gait Pattern/deviations: Step-through pattern;Decreased stride length;Trunk flexed   Gait velocity interpretation: Below normal speed for age/gender General Gait Details: Pt with mostly steady gait. Cues to stay within RW during turns.   Stairs            Wheelchair Mobility    Modified Rankin (Stroke Patients Only)       Balance Overall balance assessment: Needs assistance Sitting-balance support: Feet supported;No upper extremity supported Sitting  balance-Leahy Scale: Good Sitting balance - Comments: Able to reach outside BoS and donn socks without difficulty.    Standing balance support: During functional activity Standing balance-Leahy Scale: Fair                               Pertinent Vitals/Pain Pain Assessment: No/denies pain    Home Living Family/patient expects to be discharged to:: Private residence Living Arrangements: Spouse/significant other Available Help at Discharge: Family;Friend(s);Available 24 hours/day Type of Home: House Home Access: Stairs to enter Entrance Stairs-Rails: None Entrance Stairs-Number of Steps: 3 Home Layout: One level Home Equipment: Walker - 2 wheels      Prior Function Level of Independence: Independent with assistive device(s)         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 pt using RW PTA however trying to wean off.     Hand Dominance   Dominant Hand: Right    Extremity/Trunk Assessment   Upper Extremity Assessment:  (Reports tingling in fingers.- premorbid)           Lower Extremity Assessment: Overall WFL for tasks assessed         Communication   Communication: No difficulties  Cognition Arousal/Alertness: Awake/alert Behavior During Therapy: WFL for tasks assessed/performed Overall Cognitive Status: Within Functional Limits for tasks assessed                      General Comments      Exercises        Assessment/Plan  PT Assessment Patent does not need any further PT services  PT Diagnosis Difficulty walking   PT Problem List Decreased balance;Impaired sensation  PT Treatment Interventions Balance training;Gait training;Patient/family education;Functional mobility training;Therapeutic activities;Therapeutic exercise;Stair training   PT Goals (Current goals can be found in the Care Plan section) Acute Rehab PT Goals Patient Stated Goal: to go home today PT Goal Formulation:  With patient Time For Goal Achievement: 10/10/14 Potential to Achieve Goals: Good    Frequency Min 3X/week   Barriers to discharge        Co-evaluation               End of Session Equipment Utilized During Treatment: Gait belt Activity Tolerance: Patient tolerated treatment well Patient left: in chair;with call bell/phone within reach;with chair alarm set Nurse Communication: Mobility status    Functional Assessment Tool Used: Clinical judgment Functional Limitation: Mobility: Walking and moving around Mobility: Walking and Moving Around Current Status [phone]): At least 1 percent but less than 20 percent impaired, limited or restricted Mobility: Walking and Moving Around Goal Status [phone]): At least 1 percent but less than 20 percent impaired, limited or restricted Mobility: Walking and Moving Around Discharge Status [phone]): At least 1 percent but less than 20 percent impaired, limited or restricted    Time: 1140-1157 PT Time Calculation (min) (ACUTE ONLY): 17 min   Charges:   PT Evaluation $Initial PT Evaluation Tier I: 1 Procedure     PT G Codes:   PT G-Codes **NOT FOR INPATIENT CLASS** Functional Assessment Tool Used: Clinical judgment Functional Limitation: Mobility: Walking and moving around Mobility: Walking and Moving Around Current Status [id number]): At least 1 percent but less than 20 percent impaired, limited or restricted Mobility: Walking and Moving Around Goal Status [phone]): At least 1 percent but less than 20 percent impaired, limited or restricted Mobility: Walking and Moving Around Discharge Status [phone]): At least 1 percent but less than 20 percent impaired, limited or restricted    Ivan Stark A 09/26/2014, 1:55 PM Ivan Stark, [place], DPT [phone]

## 2014-09-26 NOTE — Progress Notes (Signed)
Pt arrived to receive day 3 etoposide.  Pt made this RN aware of his admission to hospital last night, after day 2 VP due to numbness to left arm/leg.  VSS no other complaints.  Pt notified that inpatient and outpatient treatments will not be covered same day by insurance. Pt to return tomorrow for day 3, unless otherwise notified.  This RN made Ned Card, NP aware of situation, and Dr Benay Spice paged, but page was not returned.  Note left on Dr Gearldine Shown desk to let patient know tomorrow if day 3 VP is contraindicated due to admission.

## 2014-09-26 NOTE — Progress Notes (Signed)
  Echocardiogram 2D Echocardiogram has been performed.  Darlina Sicilian M 09/26/2014, 12:58 PM

## 2014-09-26 NOTE — Consult Note (Addendum)
Referring Physician: Shanon Brow    Chief Complaint: Left arm numbness  HPI: Ivan Stark is an 69 y.o. male with a history of prostate cancer undergoing chemotherapy who yesterday as at [facility] and had acute onset of numbness in his left arm.  The patient admits to numbness in his fingers after chemotherapy but this was more than usual.  He reports that it lasted only a few minutes before resolving spontaneously.   The patient has a history of a meningioma that was resected.  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    Date last known well: Date: 09/25/2014 Time last known well: Time: 18:00 tPA Given: No: Resolution of symptoms  Past Medical History  Diagnosis Date  . Hypertension   . Hyperlipidemia   . GERD (gastroesophageal reflux disease)   . Vitamin D deficiency   . History of colon polyps   . Complication of anesthesia     Difficult to wake up; and severe nausea and vomiting  . PONV (postoperative nausea and vomiting)   . Diabetes mellitus without complication     metformin  . Cancer 03/2014    prostate ca    Past Surgical History  Procedure Laterality Date  . Tonsillectomy    . Colonoscopy w/ polypectomy    . Esophagus surgery      Had esophagus stretched due to food getting trapped in his throat  . Craniotomy Bilateral 04/06/2014    Procedure: Bilateral Fronto-parietal craniotomy for resection of tumors with Curve;  Surgeon: Consuella Lose, MD;  Location: [facility];  Service: Neurosurgery;  Laterality: Bilateral;  Bilateral Fronto-parietal craniotomy for resection of tumors with Curve  . Colonoscopy N/A 04/19/2014    Procedure: COLONOSCOPY;  Surgeon: Winfield Cunas., MD;  Location: [facility];  Service: Endoscopy;  Laterality: N/A;    Family History  Problem Relation Age of Onset  . Goiter Mother   . Hypertension Father   . Cancer Father     colon   Social History:  reports that he has never smoked. He does not have any smokeless  tobacco history on file. He reports that he does not drink alcohol or use illicit drugs.  Allergies:  Allergies  Allergen Reactions  . Atenolol Shortness Of Breath  . Lipitor [Atorvastatin]     Itch    Medications:  I have reviewed the patient's current medications. Prior to Admission:  Prescriptions prior to admission  Medication Sig Dispense Refill Last Dose  . Ascorbic Acid (VITAMIN C) 1000 MG tablet Take 1,000 mg by mouth daily.   09/25/2014 at Unknown time  . aspirin EC 81 MG tablet Take 81 mg by mouth daily.   09/25/2014 at Unknown time  . Cholecalciferol (VITAMIN D) 2000 UNITS CAPS Take 4,000 Units by mouth daily.    09/25/2014 at Unknown time  . glimepiride (AMARYL) 4 MG tablet Take 0.5 tablets (2 mg total) by mouth 2 (two) times daily. 60 tablet 1 09/25/2014 at Unknown time  . GLUCERNA (GLUCERNA) LIQD Take 237 mLs by mouth 2 (two) times a week.    Past Week at Unknown time  . HYDROcodone-acetaminophen (NORCO/VICODIN) 5-325 MG per tablet Take 1-2 tablets by mouth every 6 (six) hours as needed for moderate pain. 45 tablet 0 Past Week at Unknown time  . metFORMIN (GLUCOPHAGE-XR) 500 MG 24 hr tablet Take 1 tablet (500 mg total) by mouth 2 (two) times daily with breakfast and lunch. (Patient taking differently: Take 500-1,000 mg by mouth  3 (three) times daily. Takes 500mg  in am, 500mg  around lunch, and 1000mg  at dinner) 60 tablet 1 09/25/2014 at Unknown time  . Multiple Vitamin (MULTIVITAMIN WITH MINERALS) TABS Take 1 tablet by mouth daily.   09/25/2014 at Unknown time  . polyethylene glycol (MIRALAX / GLYCOLAX) packet Take 60 g by mouth 2 (two) times daily. (Patient taking differently: Take 60 g by mouth every morning. ) 14 each 0 09/25/2014 at Unknown time  . polyethylene glycol (MIRALAX / GLYCOLAX) packet Take 17 g by mouth daily.   09/25/2014 at Unknown time  . prochlorperazine (COMPAZINE) 10 MG tablet Take 1 tablet (10 mg total) by mouth every 6 (six) hours as needed for nausea or vomiting.  30 tablet 0 Past Month at Unknown time  . senna (SENOKOT) 8.6 MG TABS tablet Take 2 tablets (17.2 mg total) by mouth 2 (two) times daily. For constipation. Adjust as  needed (Patient taking differently: Take 1 tablet by mouth every evening. For constipation. Adjust as  needed) 120 each 0 09/24/2014 at Unknown time  . ALPRAZolam (XANAX) 0.5 MG tablet Take 1 tablet (0.5 mg total) by mouth 2 (two) times daily as needed for anxiety. 30 tablet 0 not started  . Amino Acids-Protein Hydrolys (FEEDING SUPPLEMENT, PRO-STAT SUGAR FREE 64,) LIQD Take 30 mLs by mouth 3 (three) times daily with meals. (Patient not taking: Reported on 09/20/2014) 900 mL 1 Not Taking  . calcium-vitamin D (OSCAL WITH D) 500-200 MG-UNIT per tablet Take 1 tablet by mouth 2 (two) times daily. (Patient taking differently: Take 2 tablets by mouth daily with breakfast. ) 60 tablet 1 Taking  . docusate sodium (COLACE) 100 MG capsule Take 1 capsule (100 mg total) by mouth every 12 (twelve) hours. 10 capsule 0 Taking  . simethicone (MYLICON) 80 MG chewable tablet Chew 1 tablet (80 mg total) by mouth 4 (four) times daily as needed for flatulence. 120 tablet 0 Taking   Scheduled: .  stroke: mapping our early stages of recovery book   Does not apply Once  . aspirin EC  81 mg Oral Daily  . sodium chloride  3 mL Intravenous Q12H    ROS: History obtained from the patient  General ROS: negative for - chills, fatigue, fever, night sweats, weight gain or weight loss Psychological ROS: negative for - behavioral disorder, hallucinations, memory difficulties, mood swings or suicidal ideation Ophthalmic ROS: negative for - blurry vision, double vision, eye pain or loss of vision ENT ROS: negative for - epistaxis, nasal discharge, oral lesions, sore throat, tinnitus or vertigo Allergy and Immunology ROS: negative for - hives or itchy/watery eyes Hematological and Lymphatic ROS: negative for - bleeding problems, bruising or swollen lymph  nodes Endocrine ROS: negative for - galactorrhea, hair pattern changes, polydipsia/polyuria or temperature intolerance Respiratory ROS: negative for - cough, hemoptysis, shortness of breath or wheezing Cardiovascular ROS: negative for - chest pain, dyspnea on exertion, edema or irregular heartbeat Gastrointestinal ROS: negative for - abdominal pain, diarrhea, hematemesis, nausea/vomiting or stool incontinence Genito-Urinary ROS: negative for - dysuria, hematuria, incontinence or urinary frequency/urgency Musculoskeletal ROS: negative for - joint swelling or muscular weakness Neurological ROS: as noted in HPI Dermatological ROS: negative for rash and skin lesion changes  Physical Examination: Blood pressure 106/58, pulse 63, temperature 97.9 F (36.6 C), temperature source Oral, resp. rate 20, height 6\' 3"  (1.905 m), weight 83.28 kg (183 lb 9.6 oz), SpO2 95 %.  HEENT-  Normocephalic, no lesions, without obvious abnormality.  Normal external eye and conjunctiva.  Normal TM's bilaterally.  Normal auditory canals and external ears. Normal external nose, mucus membranes and septum.  Normal pharynx. Cardiovascular- S1, S2 normal, pulses palpable throughout   Lungs- chest clear, no wheezing, rales, normal symmetric air entry Abdomen- soft, non-tender; bowel sounds normal; no masses,  no organomegaly Extremities- no edema Lymph-no adenopathy palpable Musculoskeletal-no joint tenderness, deformity or swelling Skin-warm and dry, no hyperpigmentation, vitiligo, or suspicious lesions  Neurological Examination Mental Status: Alert, oriented, thought content appropriate.  Speech fluent without evidence of aphasia.  Able to follow 3 step commands without difficulty. Cranial Nerves: II: Discs flat bilaterally; Visual fields grossly normal, pupils equal, round, reactive to light and accommodation III,IV, VI: ptosis not present, extra-ocular motions intact bilaterally V,VII: decrease in left NLF, facial  light touch sensation normal bilaterally VIII: hearing normal bilaterally IX,X: gag reflex present XI: bilateral shoulder shrug XII: midline tongue extension Motor: Right : Upper extremity   5/5    Left:     Upper extremity   5/5  Lower extremity   5/5     Lower extremity   5-/5 Tone and bulk:normal tone throughout; no atrophy noted Sensory: Pinprick and light touch intact throughout, bilaterally Deep Tendon Reflexes: 2+ and symmetric with absent AJ's bilaterally. Plantars: Right: mute   Left: mute Cerebellar: normal finger-to-nose and normal heel-to-shin testing bilaterally Gait: not tested due to safety concerns    Laboratory Studies:  Basic Metabolic Panel:  Recent Labs Lab 09/24/14 1200 09/25/14 1840 09/25/14 1853  NA 141 141 142  K 4.0 3.5 3.6  CL  --  108 110  CO2 23 22  --   GLUCOSE 181* 71 70  BUN 9.8 17 21   CREATININE 0.8 1.01 0.90  CALCIUM 9.3 8.7  --     Liver Function Tests:  Recent Labs Lab 09/24/14 1200 09/25/14 1840  AST 13 21  ALT 9 13  ALKPHOS 75 64  BILITOT 0.36 0.7  PROT 6.0* 5.8*  ALBUMIN 3.1* 3.1*   No results for input(s): LIPASE, AMYLASE in the last 168 hours. No results for input(s): AMMONIA in the last 168 hours.  CBC:  Recent Labs Lab 09/24/14 1200 09/25/14 1840 09/25/14 1853  WBC 7.3 7.6  --   NEUTROABS 5.4 5.1  --   HGB 12.1* 11.2* 12.6*  HCT 38.7 35.2* 37.0*  MCV 87.0 87.1  --   PLT 291 290  --     Cardiac Enzymes: No results for input(s): CKTOTAL, CKMB, CKMBINDEX, TROPONINI in the last 168 hours.  BNP: Invalid input(s): POCBNP  CBG: No results for input(s): GLUCAP in the last 168 hours.  Microbiology: Results for orders placed or performed in visit on 05/29/14  Urine culture     Status: None   Collection Time: 05/29/14 11:02 AM  Result Value Ref Range Status   Colony Count 6,000 COLONIES/ML  Final   Organism ID, Bacteria Insignificant Growth  Final    Coagulation Studies:  Recent Labs   09/25/14 1840  LABPROT 13.5  INR 1.02    Urinalysis: No results for input(s): COLORURINE, LABSPEC, PHURINE, GLUCOSEU, HGBUR, BILIRUBINUR, KETONESUR, PROTEINUR, UROBILINOGEN, NITRITE, LEUKOCYTESUR in the last 168 hours.  Invalid input(s): APPERANCEUR  Lipid Panel:    Component Value Date/Time   CHOL 230* 09/05/2014 1232   TRIG 198* 09/05/2014 1232   HDL 34* 09/05/2014 1232   CHOLHDL 6.8 09/05/2014 1232   VLDL 40 09/05/2014 1232   LDLCALC 156* 09/05/2014 1232    HgbA1C:  Lab Results  Component Value Date  HGBA1C 8.5* 09/05/2014    Urine Drug Screen:  No results found for: LABOPIA, COCAINSCRNUR, LABBENZ, AMPHETMU, THCU, LABBARB  Alcohol Level:  Recent Labs Lab 09/25/14 [place] <5    Other results: EKG: sinus rhythm at 71 bpm.  Imaging: Ct Head Wo Contrast  09/25/2014   CLINICAL DATA:  Worsening left-sided numbness after chemotherapy. Metastatic prostate cancer. History of left frontal meningioma.  EXAM: CT HEAD WITHOUT CONTRAST  TECHNIQUE: Contiguous axial images were obtained from the base of the skull through the vertex without intravenous contrast.  COMPARISON:  MRI 09/18/2014.  CT 02/08/2014.  FINDINGS: No evidence of acute infarction or intracranial hemorrhage. The patient has had previous craniotomy at the vertex. There is cortical and subcortical encephalomalacia in that region. No mass identifiable by CT. Chronic small-vessel changes of the white matter appear the same. No hydrocephalus. No extra-axial collection. No acute calvarial finding. Fluid throughout the mastoid air cells on the right. Small amount of fluid on the left.  IMPRESSION: No acute finding by CT. Previous craniotomy at the vertex. Underlying areas of encephalomalacia. Chronic small vessel change of the white matter. Fluid throughout the mastoid air cells, right more than left.   Electronically Signed   By: Nelson Chimes M.D.   On: 09/25/2014 19:58    Assessment: 69 y.o. male presenting after an  episode of LUE numbness. Symptoms have now resolved and patient reports that he is back to baseline.  Head CT personally reviewed and shows no acute changes.  Patient had a follow up MRI of the brain a week ago that showed improvement in his previous cerebral abnormalities.  The patient refuses a repeat MRI at this time.  With patient being s/p surgery can not rule out seizure as well.    Stroke Risk Factors - diabetes mellitus, hyperlipidemia and hypertension  Plan: 1. HgbA1c, fasting lipid panel 2. EEG 3. Patient to remain on an aspirin a day.   4. Telemetry monitoring 5. Frequent neuro checks 6. Overnight observation 7. Seizure precautions 8. Echocardiogram 9. Carotid dopplers   Alexis Goodell, MD [facility] [phone] 09/26/2014, 12:40 AM

## 2014-09-26 NOTE — Progress Notes (Addendum)
PT Cancellation Note  Patient Details Name: Ivan Stark MRN: [id number] DOB: 18-May-1946   Cancelled Treatment:    Reason Eval/Treat Not Completed: Patient at procedure or test/unavailable Pt off floor at EEG. Will follow up next available time to perform PT evaluation.   [name] A 09/26/2014, 11:17 AM [name], PT, DPT [phone]

## 2014-09-26 NOTE — Progress Notes (Signed)
Pt. Arrived from ED with tech with VSS and reporting no pain. Will continue to monitor. Joaquin Bend E, RN 09/26/2014 2:22 AM

## 2014-09-26 NOTE — Progress Notes (Signed)
UR completed 

## 2014-09-26 NOTE — Progress Notes (Signed)
Discharge orders received.  Discharge instructions and follow-up appointments reviewed with the patient.  VSS upon discharge.  IV removed and education complete.  Transported out via wheelchair wife present. Cori Razor, RN

## 2014-09-26 NOTE — Progress Notes (Signed)
EEG completed, results pending. 

## 2014-09-26 NOTE — Discharge Summary (Signed)
PATIENT DETAILS Name: Ivan Stark Age: 69 y.o. Sex: male Date of Birth: [birth year] MRN: [id number]. Admitting Physician: Phillips Grout, MD WUJ:WJXBJYN,WGNFAOZ DAVID, MD  Admit Date: 09/25/2014 Discharge date: 09/26/2014  Recommendations for Outpatient Follow-up:  1. Echocardiogram and carotid Doppler pending-please follow. 2. Patient refused MRI brain for further evaluation 3. Ideally should be on statin as LDL not at goal-patient and family refused-claim her chemotherapy interferes with statin. Please reassess at next visit. 4. Hemoglobin A1c pending at the time of discharge-please follow and optimize her vitamin regimen accordingly.  PRIMARY DISCHARGE DIAGNOSIS:  Principal Problem:   TIA (transient ischemic attack) Active Problems:   Hypertension   Brain metastases   Small cell carcinoma of prostate   Left arm numbness      PAST MEDICAL HISTORY: Past Medical History  Diagnosis Date  . Hypertension   . Hyperlipidemia   . GERD (gastroesophageal reflux disease)   . Vitamin D deficiency   . History of colon polyps   . Complication of anesthesia     Difficult to wake up; and severe nausea and vomiting  . PONV (postoperative nausea and vomiting)   . Diabetes mellitus without complication     metformin  . Cancer 03/2014    prostate ca    DISCHARGE MEDICATIONS: Current Discharge Medication List    CONTINUE these medications which have CHANGED   Details  aspirin EC 325 MG EC tablet Take 1 tablet (325 mg total) by mouth daily. Qty: 30 tablet, Refills: 0      CONTINUE these medications which have NOT CHANGED   Details  Ascorbic Acid (VITAMIN C) 1000 MG tablet Take 1,000 mg by mouth daily.    Cholecalciferol (VITAMIN D) 2000 UNITS CAPS Take 4,000 Units by mouth daily.     glimepiride (AMARYL) 4 MG tablet Take 0.5 tablets (2 mg total) by mouth 2 (two) times daily. Qty: 60 tablet, Refills: 1    GLUCERNA (GLUCERNA) LIQD Take 237 mLs by mouth 2 (two) times a week.      HYDROcodone-acetaminophen (NORCO/VICODIN) 5-325 MG per tablet Take 1-2 tablets by mouth every 6 (six) hours as needed for moderate pain. Qty: 45 tablet, Refills: 0    metFORMIN (GLUCOPHAGE-XR) 500 MG 24 hr tablet Take 1 tablet (500 mg total) by mouth 2 (two) times daily with breakfast and lunch. Qty: 60 tablet, Refills: 1    Multiple Vitamin (MULTIVITAMIN WITH MINERALS) TABS Take 1 tablet by mouth daily.    !! polyethylene glycol (MIRALAX / GLYCOLAX) packet Take 60 g by mouth 2 (two) times daily. Qty: 14 each, Refills: 0    !! polyethylene glycol (MIRALAX / GLYCOLAX) packet Take 17 g by mouth daily.    prochlorperazine (COMPAZINE) 10 MG tablet Take 1 tablet (10 mg total) by mouth every 6 (six) hours as needed for nausea or vomiting. Qty: 30 tablet, Refills: 0    senna (SENOKOT) 8.6 MG TABS tablet Take 2 tablets (17.2 mg total) by mouth 2 (two) times daily. For constipation. Adjust as  needed Qty: 120 each, Refills: 0    ALPRAZolam (XANAX) 0.5 MG tablet Take 1 tablet (0.5 mg total) by mouth 2 (two) times daily as needed for anxiety. Qty: 30 tablet, Refills: 0   Associated Diagnoses: Small cell carcinoma of prostate; Brain metastases     !! - Potential duplicate medications found. Please discuss with provider.    STOP taking these medications     Amino Acids-Protein Hydrolys (FEEDING SUPPLEMENT, PRO-STAT SUGAR FREE 64,) LIQD  calcium-vitamin D (OSCAL WITH D) 500-200 MG-UNIT per tablet      docusate sodium (COLACE) 100 MG capsule      simethicone (MYLICON) 80 MG chewable tablet         ALLERGIES:   Allergies  Allergen Reactions  . Atenolol Shortness Of Breath  . Lipitor [Atorvastatin]     Itch    BRIEF HPI:  See H&P, Labs, Consult and Test reports for all details in brief, patient is a 69 year old male with history of prostate cancer with metastases to his brain status post resection, diabetes, hypertension who was admitted with left hand tingling and  numbness.  CONSULTATIONS:   neurology  PERTINENT RADIOLOGIC STUDIES: Ct Head Wo Contrast  09/25/2014   CLINICAL DATA:  Worsening left-sided numbness after chemotherapy. Metastatic prostate cancer. History of left frontal meningioma.  EXAM: CT HEAD WITHOUT CONTRAST  TECHNIQUE: Contiguous axial images were obtained from the base of the skull through the vertex without intravenous contrast.  COMPARISON:  MRI 09/18/2014.  CT 02/08/2014.  FINDINGS: No evidence of acute infarction or intracranial hemorrhage. The patient has had previous craniotomy at the vertex. There is cortical and subcortical encephalomalacia in that region. No mass identifiable by CT. Chronic small-vessel changes of the white matter appear the same. No hydrocephalus. No extra-axial collection. No acute calvarial finding. Fluid throughout the mastoid air cells on the right. Small amount of fluid on the left.  IMPRESSION: No acute finding by CT. Previous craniotomy at the vertex. Underlying areas of encephalomalacia. Chronic small vessel change of the white matter. Fluid throughout the mastoid air cells, right more than left.   Electronically Signed   By: Nelson Chimes M.D.   On: 09/25/2014 19:58   [name] MW Contrast  09/19/2014   CLINICAL DATA:  Metastatic small-cell carcinoma of the prostate. Whole-brain radiation 11- 05/2014. Last chemotherapy in 07/2014. Right frontal brain metastasis resected 04/06/2014. Left frontal meningioma also resected at the same time.  EXAM: MRI HEAD WITHOUT AND WITH CONTRAST  TECHNIQUE: Multiplanar, multiecho pulse sequences of the brain and surrounding structures were obtained without and with intravenous contrast.  CONTRAST:  52mL MULTIHANCE GADOBENATE DIMEGLUMINE 529 MG/ML IV SOLN  COMPARISON:  04/07/2014  FINDINGS: There is no acute infarct or midline shift. Sequelae of prior bilateral parietal craniotomy are again identified. Extra-axial fluid collection subjacent to the craniotomy has slightly decreased  in size, measuring 4.3 x 1.0 cm. Bilateral posterior frontal resection cavities have collapsed in the interim. A small amount of chronic blood products remain at both resections sites. There is minimal residual non masslike enhancement at the posterior right frontal resection site, decreased from the prior study.  There is a small amount of new non masslike enhancement more posteriorly in the paramedian right frontoparietal region corresponding to an area of restricted diffusion on the postoperative MRI (series 11, image 97 and series 13, image 19). Enhancement at the posterior left frontal resection site has increased in the interim and predominantly consists of thickened enhancement along the margins of the cavity, measuring in total approximately 1.5 x 1.4 cm (series 13, image 24). This largely corresponds to restricted diffusion on the postoperative MRI. There is a small amount of edema or gliosis about both resection sites.  7 mm enhancing mass associated with the falx more anteriorly has decreased in size (series 11, image 89 and series 13, image 32, previously approximately 1 cm). A small right frontal developmental venous anomaly is noted. Patchy and confluent T2 hyperintensities elsewhere in the  subcortical and deep cerebral white matter bilaterally have mildly progressed from the prior MRI and likely reflect a combination of chronic small vessel ischemia and posttherapy changes. There is mild global cerebral atrophy.  Orbits are unremarkable. Polyp/mucous retention cyst in the right maxillary sinus is unchanged. There are large bilateral mastoid effusions. Major intracranial vascular flow voids are preserved.  IMPRESSION: 1. Interval collapse of the bilateral posterior frontal resection cavities. Minimal enhancement at the right frontal resection site has decreased. There is increased enhancement along the margins of the left frontal resection cavity, favored to be postoperative although attention is  recommended on follow-up. 2. Small amount of new non masslike enhancement more posteriorly in the paramedian right frontoparietal region, likely postoperative/post ischemic. 3. Decreased size of 7 mm falcine mass. 4. No evidence of new intracranial metastases.   Electronically Signed   By: Logan Bores   On: 09/19/2014 08:33     PERTINENT LAB RESULTS: CBC:  Recent Labs  09/24/14 1200 09/25/14 1840 09/25/14 1853  WBC 7.3 7.6  --   HGB 12.1* 11.2* 12.6*  HCT 38.7 35.2* 37.0*  PLT 291 290  --    CMET CMP     Component Value Date/Time   NA 142 09/25/2014 1853   NA 141 09/24/2014 1200   K 3.6 09/25/2014 1853   K 4.0 09/24/2014 1200   CL 110 09/25/2014 1853   CO2 22 09/25/2014 1840   CO2 23 09/24/2014 1200   GLUCOSE 70 09/25/2014 1853   GLUCOSE 181* 09/24/2014 1200   BUN 21 09/25/2014 1853   BUN 9.8 09/24/2014 1200   CREATININE 0.90 09/25/2014 1853   CREATININE 0.8 09/24/2014 1200   CREATININE 0.81 09/05/2014 1232   CALCIUM 8.7 09/25/2014 1840   CALCIUM 9.3 09/24/2014 1200   PROT 5.8* 09/25/2014 1840   PROT 6.0* 09/24/2014 1200   ALBUMIN 3.1* 09/25/2014 1840   ALBUMIN 3.1* 09/24/2014 1200   AST 21 09/25/2014 1840   AST 13 09/24/2014 1200   ALT 13 09/25/2014 1840   ALT 9 09/24/2014 1200   ALKPHOS 64 09/25/2014 1840   ALKPHOS 75 09/24/2014 1200   BILITOT 0.7 09/25/2014 1840   BILITOT 0.36 09/24/2014 1200   GFRNONAA 75* 09/25/2014 1840   GFRNONAA >89 09/05/2014 1232   GFRAA 87* 09/25/2014 1840   GFRAA >89 09/05/2014 1232    GFR Estimated Creatinine Clearance: 93.8 mL/min (by C-G formula based on Cr of 0.9). No results for input(s): LIPASE, AMYLASE in the last 72 hours. No results for input(s): CKTOTAL, CKMB, CKMBINDEX, TROPONINI in the last 72 hours. Invalid input(s): POCBNP No results for input(s): DDIMER in the last 72 hours. No results for input(s): HGBA1C in the last 72 hours.  Recent Labs  09/26/14 0417  CHOL 182  HDL 30*  LDLCALC 129*  TRIG 114   CHOLHDL 6.1   No results for input(s): TSH, T4TOTAL, T3FREE, THYROIDAB in the last 72 hours.  Invalid input(s): FREET3 No results for input(s): VITAMINB12, FOLATE, FERRITIN, TIBC, IRON, RETICCTPCT in the last 72 hours. Coags:  Recent Labs  09/25/14 1840  INR 1.02   Microbiology: No results found for this or any previous visit (from the past 240 hour(s)).   BRIEF HOSPITAL COURSE:   Principal Problem:   Suspected TIA (transient ischemic attack): Patient was admitted with left hand numbness, per patient and family the has been having numbness and tingling of bilateral hands after chemotherapy for the past 2 months. However this numbness extended more proximally onto his forearm,  hence necessitating emergency room visit. Patient underwent a CT scan of the head which was negative for acute abnormalities. Since patient just had MRI of the brain a few weeks back, he did not want to repeat one. He also did not want further workup because of financial issues. Doing his brief stay here in the hospital, he underwent EEG that was negative. 2-D echocardiogram was also done but was pending at the time of discharge. Carotid Doppler was also done but pending at the time of discharge. This M.D. was notified by RN, that patient and patient's spouse were wanting to be discharge before 2:00 pm today so that they could go to the [facility] for chemotherapy. This M.D. then spoke with both patient and spouse, explained that there was some suspicion for TIA, explained that patient is at risk of having catastrophic CVA which can cause life-threatening and life disabling consequences. Both understood, and wanted to be discharged so that patient could get his last chemotherapy session today.. At this time our recommendation are to increase aspirin to 325 mg. Patient and spouse do not want to start on statin, as they claimed that it interferes with patient's chemotherapy. I subsequently contacted patient's primary  oncologist- Dr Learta Codding and informed him of patient's hospitalization. I have also asked patient and spouse to make sure to call the PCP office tomorrow and follow-up on results of echocardiogram and carotid Doppler.  Rest of the patient's medical issues were stable during this hospitalization   TODAY-DAY OF DISCHARGE:  Subjective:   Jonah Blue today has no headache,no chest abdominal pain,no new weakness tingling or numbness, feels much better wants to go home today.   Objective:   Blood pressure 115/72, pulse 63, temperature 98.2 F (36.8 C), temperature source Oral, resp. rate 20, height 6\' 3"  (1.905 m), weight 83.28 kg (183 lb 9.6 oz), SpO2 100 %.  Intake/Output Summary (Last 24 hours) at 09/26/14 1306 Last data filed at 09/26/14 0424  Gross per 24 hour  Intake      0 ml  Output    425 ml  Net   -425 ml   Filed Weights   09/25/14 2224  Weight: 83.28 kg (183 lb 9.6 oz)    Exam Awake Alert, Oriented *3, No new F.N deficits, Normal affect [place].AT,PERRAL Supple Neck,No JVD, No cervical lymphadenopathy appriciated.  Symmetrical Chest wall movement, Good air movement bilaterally, CTAB RRR,No Gallops,Rubs or new Murmurs, No Parasternal Heave +ve B.Sounds, Abd Soft, Non tender, No organomegaly appriciated, No rebound -guarding or rigidity. No Cyanosis, Clubbing or edema, No new Rash or bruise  DISCHARGE CONDITION: Stable  DISPOSITION: Home  DISCHARGE INSTRUCTIONS:    Activity:  As tolerated   Diet recommendation: Diabetic Diet Heart Healthy diet  Discharge Instructions    Call MD for:  persistant dizziness or light-headedness    Complete by:  As directed      Diet - low sodium heart healthy    Complete by:  As directed      Diet Carb Modified    Complete by:  As directed      Increase activity slowly    Complete by:  As directed            Follow-up Information    Follow up with Alesia Richards, MD. Schedule an appointment as soon as possible for a  visit in 2 days.   Specialty:  Internal Medicine   Why:  please ask your MD to follow up Carotid Doppler and Echocardiogram results-pending  at the time of discharge.   Contact information:   [address] [place] [address] [phone]       Go to Betsy Coder, MD.   Specialty:  Oncology   Why:  keep next appt   Contact information:   [address] [place] [address] [phone]       Total Time spent on discharge equals 25 minutes.  SignedOren Binet 09/26/2014 1:06 PM

## 2014-09-26 NOTE — Progress Notes (Signed)
VASCULAR LAB PRELIMINARY  PRELIMINARY  PRELIMINARY  PRELIMINARY  Carotid duplex completed.    Preliminary report:  Bilateral:  1-39% ICA stenosis.  Vertebral artery flow is antegrade.     Monice Lundy, RVS 09/26/2014, 4:27 PM

## 2014-09-27 ENCOUNTER — Ambulatory Visit (HOSPITAL_BASED_OUTPATIENT_CLINIC_OR_DEPARTMENT_OTHER): Payer: Medicare Other

## 2014-09-27 ENCOUNTER — Ambulatory Visit: Payer: Medicare Other

## 2014-09-27 VITALS — BP 127/69 | HR 66 | Temp 98.1°F | Resp 18

## 2014-09-27 DIAGNOSIS — C7931 Secondary malignant neoplasm of brain: Secondary | ICD-10-CM | POA: Diagnosis not present

## 2014-09-27 DIAGNOSIS — Z5111 Encounter for antineoplastic chemotherapy: Secondary | ICD-10-CM | POA: Diagnosis not present

## 2014-09-27 DIAGNOSIS — C61 Malignant neoplasm of prostate: Secondary | ICD-10-CM | POA: Diagnosis not present

## 2014-09-27 LAB — HEMOGLOBIN A1C
Hgb A1c MFr Bld: 8.4 % — ABNORMAL HIGH (ref 4.8–5.6)
Mean Plasma Glucose: 194 mg/dL

## 2014-09-27 MED ORDER — SODIUM CHLORIDE 0.9 % IV SOLN
Freq: Once | INTRAVENOUS | Status: AC
  Administered 2014-09-27: 09:00:00 via INTRAVENOUS

## 2014-09-27 MED ORDER — SODIUM CHLORIDE 0.9 % IV SOLN
Freq: Once | INTRAVENOUS | Status: AC
  Administered 2014-09-27: 09:00:00 via INTRAVENOUS
  Filled 2014-09-27: qty 4

## 2014-09-27 MED ORDER — SODIUM CHLORIDE 0.9 % IV SOLN
100.0000 mg/m2 | Freq: Once | INTRAVENOUS | Status: AC
  Administered 2014-09-27: 220 mg via INTRAVENOUS
  Filled 2014-09-27: qty 11

## 2014-09-27 NOTE — Patient Instructions (Signed)
[facility] Discharge Instructions for Patients Receiving Chemotherapy  Today you received the following chemotherapy agents: Etoposide   To help prevent nausea and vomiting after your treatment, we encourage you to take your nausea medication as directed.    If you develop nausea and vomiting that is not controlled by your nausea medication, call the clinic.   BELOW ARE SYMPTOMS THAT SHOULD BE REPORTED IMMEDIATELY:  *FEVER GREATER THAN 100.5 F  *CHILLS WITH OR WITHOUT FEVER  NAUSEA AND VOMITING THAT IS NOT CONTROLLED WITH YOUR NAUSEA MEDICATION  *UNUSUAL SHORTNESS OF BREATH  *UNUSUAL BRUISING OR BLEEDING  TENDERNESS IN MOUTH AND THROAT WITH OR WITHOUT PRESENCE OF ULCERS  *URINARY PROBLEMS  *BOWEL PROBLEMS  UNUSUAL RASH Items with * indicate a potential emergency and should be followed up as soon as possible.  Feel free to call the clinic you have any questions or concerns. The clinic phone number is [phone].  Please show the CHEMO ALERT CARD at check-in to the Emergency Department and triage nurse.   

## 2014-09-28 ENCOUNTER — Ambulatory Visit: Payer: Medicare Other

## 2014-09-28 ENCOUNTER — Ambulatory Visit (HOSPITAL_BASED_OUTPATIENT_CLINIC_OR_DEPARTMENT_OTHER): Payer: Medicare Other

## 2014-09-28 VITALS — BP 122/71 | HR 79 | Temp 98.2°F

## 2014-09-28 DIAGNOSIS — C61 Malignant neoplasm of prostate: Secondary | ICD-10-CM | POA: Diagnosis not present

## 2014-09-28 DIAGNOSIS — Z5189 Encounter for other specified aftercare: Secondary | ICD-10-CM | POA: Diagnosis not present

## 2014-09-28 MED ORDER — PEGFILGRASTIM INJECTION 6 MG/0.6ML ~~LOC~~
6.0000 mg | PREFILLED_SYRINGE | Freq: Once | SUBCUTANEOUS | Status: AC
  Administered 2014-09-28: 6 mg via SUBCUTANEOUS
  Filled 2014-09-28: qty 0.6

## 2014-10-02 ENCOUNTER — Ambulatory Visit (HOSPITAL_COMMUNITY): Payer: Medicare Other

## 2014-10-03 ENCOUNTER — Ambulatory Visit: Payer: Medicare Other

## 2014-10-03 ENCOUNTER — Ambulatory Visit: Payer: Self-pay | Admitting: Radiation Oncology

## 2014-10-03 ENCOUNTER — Telehealth: Payer: Self-pay | Admitting: *Deleted

## 2014-10-03 DIAGNOSIS — R269 Unspecified abnormalities of gait and mobility: Secondary | ICD-10-CM

## 2014-10-03 NOTE — Telephone Encounter (Signed)
Received message from pt requesting letter from Dr. Benay Spice; "I need a letter stating you have released me so I can have PT"  Note to Dr. Benay Spice.

## 2014-10-03 NOTE — Therapy (Signed)
[facility] [address] [place], [place], [address] Phone: [phone]   Fax:  [phone]  Patient Details  Name: Ivan Stark MRN: [id number] Date of Birth: [birth year] Referring Provider:  Unk Pinto, MD  Encounter Date: 10/03/2014  [id number]   Pt not seen, as he did not bring MD note clearing pt for PT after hospital admission for possible TIA. Pt will bring note for next visit. Pt not charged for visit.  Letta Cargile L 10/03/2014, 11:15 AM  [facility] [address] [place], [place], [address] Phone: [phone]   Fax:  [phone]

## 2014-10-04 NOTE — Telephone Encounter (Signed)
Notified pt that request for letter has been put on MD desk and will take 3-5 business days to process and office will call when available for p/u.  Pt verbalized understanding and expressed appreciation for call.

## 2014-10-05 ENCOUNTER — Ambulatory Visit: Payer: Medicare Other

## 2014-10-08 ENCOUNTER — Encounter: Payer: Self-pay | Admitting: Oncology

## 2014-10-08 ENCOUNTER — Telehealth: Payer: Self-pay | Admitting: *Deleted

## 2014-10-08 NOTE — Telephone Encounter (Signed)
Notified Ivan Stark in outpatient neuro-rehab that pt is OK to resume physical therapy. Dr. Benay Spice has dictated a letter to this regard. She will get this from [facility].

## 2014-10-10 ENCOUNTER — Ambulatory Visit (HOSPITAL_COMMUNITY)
Admission: RE | Admit: 2014-10-10 | Discharge: 2014-10-10 | Disposition: A | Discharge status: Home / Self Care | Payer: Medicare Other | Source: Ambulatory Visit | Attending: Nurse Practitioner | Admitting: Nurse Practitioner

## 2014-10-10 ENCOUNTER — Ambulatory Visit: Payer: Medicare Other

## 2014-10-10 ENCOUNTER — Encounter (HOSPITAL_COMMUNITY): Payer: Self-pay

## 2014-10-10 DIAGNOSIS — C7931 Secondary malignant neoplasm of brain: Secondary | ICD-10-CM

## 2014-10-10 DIAGNOSIS — C61 Malignant neoplasm of prostate: Secondary | ICD-10-CM | POA: Diagnosis not present

## 2014-10-10 MED ORDER — IOHEXOL 300 MG/ML  SOLN
100.0000 mL | Freq: Once | INTRAMUSCULAR | Status: AC | PRN
  Administered 2014-10-10: 100 mL via INTRAVENOUS

## 2014-10-12 ENCOUNTER — Ambulatory Visit: Payer: Medicare Other | Attending: Physical Medicine & Rehabilitation

## 2014-10-12 DIAGNOSIS — C61 Malignant neoplasm of prostate: Secondary | ICD-10-CM | POA: Diagnosis present

## 2014-10-12 DIAGNOSIS — K219 Gastro-esophageal reflux disease without esophagitis: Secondary | ICD-10-CM | POA: Insufficient documentation

## 2014-10-12 DIAGNOSIS — R269 Unspecified abnormalities of gait and mobility: Secondary | ICD-10-CM | POA: Diagnosis not present

## 2014-10-12 DIAGNOSIS — E785 Hyperlipidemia, unspecified: Secondary | ICD-10-CM | POA: Diagnosis not present

## 2014-10-12 DIAGNOSIS — E559 Vitamin D deficiency, unspecified: Secondary | ICD-10-CM | POA: Insufficient documentation

## 2014-10-12 DIAGNOSIS — C7931 Secondary malignant neoplasm of brain: Secondary | ICD-10-CM | POA: Insufficient documentation

## 2014-10-12 DIAGNOSIS — I129 Hypertensive chronic kidney disease with stage 1 through stage 4 chronic kidney disease, or unspecified chronic kidney disease: Secondary | ICD-10-CM | POA: Insufficient documentation

## 2014-10-12 DIAGNOSIS — G8194 Hemiplegia, unspecified affecting left nondominant side: Secondary | ICD-10-CM | POA: Diagnosis not present

## 2014-10-12 DIAGNOSIS — N181 Chronic kidney disease, stage 1: Secondary | ICD-10-CM | POA: Insufficient documentation

## 2014-10-12 DIAGNOSIS — E1122 Type 2 diabetes mellitus with diabetic chronic kidney disease: Secondary | ICD-10-CM | POA: Diagnosis not present

## 2014-10-12 NOTE — Therapy (Signed)
[facility] [address] [place], [place], [address] Phone: [phone]   Fax:  [phone]  Physical Therapy Treatment  Patient Details  Name: Ivan Stark MRN: [id number] Date of Birth: 12/17/1945 Referring Provider:  Lucky Cowboy, MD  Encounter Date: 10/12/2014      PT End of Session - 10/12/14 1232    Visit Number 13   Number of Visits 17   Date for PT Re-Evaluation 10/14/14  PT requesting additonal 5 weeks of PT. New POC will be until 11/16/14.   Authorization Type G-code every 10th visit.   PT Start Time 1107   PT Stop Time 1145   PT Time Calculation (min) 38 min   Equipment Utilized During Treatment Gait belt   Activity Tolerance Patient tolerated treatment well   Behavior During Therapy WFL for tasks assessed/performed      Past Medical History  Diagnosis Date  . Hypertension   . Hyperlipidemia   . GERD (gastroesophageal reflux disease)   . Vitamin D deficiency   . History of colon polyps   . Complication of anesthesia     Difficult to wake up; and severe nausea and vomiting  . PONV (postoperative nausea and vomiting)   . Diabetes mellitus without complication     metformin  . Cancer 03/2014    prostate ca    Past Surgical History  Procedure Laterality Date  . Tonsillectomy    . Colonoscopy w/ polypectomy    . Esophagus surgery      Had esophagus stretched due to food getting trapped in his throat  . Craniotomy Bilateral 04/06/2014    Procedure: Bilateral Fronto-parietal craniotomy for resection of tumors with Curve;  Surgeon: Lisbeth Renshaw, MD;  Location: [facility];  Service: Neurosurgery;  Laterality: Bilateral;  Bilateral Fronto-parietal craniotomy for resection of tumors with Curve  . Colonoscopy N/A 04/19/2014    Procedure: COLONOSCOPY;  Surgeon: Vertell Novak., MD;  Location: [facility] END[facility]COPY;  Service: Endoscopy;  Laterality: N/A;    There were no vitals filed for this  visit.  Visit Diagnosis:  Abnormality of gait      Subjective Assessment - 10/12/14 1111    Subjective Pt reported his L leg feels better and he's [place]. Pt denied falls since last visit. Pt also reported he is feeling better mentally, and really wants to perform HEP and walk several times a week at the mall.   Pertinent History Active prostate cancer with brain metastasis   Patient Stated Goals Walk without RW   Currently in Pain? No/denies                         [facility] Adult PT Treatment/Exercise - 10/12/14 1114    Transfers   Transfers Sit to Stand;Stand to Sit   Sit to Stand 5: Supervision;With upper extremity assist   Sit to Stand Details (indicate cue type and reason) x10. VC's to improve ant. weight shifting and to decrease use of UEs.   Stand to Sit 5: Supervision;With upper extremity assist;Without upper extremity assist;To chair/3-in-1   Stand to Sit Details x10. VC's to improve eccentric control.   Ambulation/Gait   Ambulation/Gait Yes   Ambulation/Gait Assistance 5: Supervision   Ambulation/Gait Assistance Details VC's to look straight ahead, improve L heel strike, and stride length; especially when traversing turns.    Ambulation Distance (Feet) --  75', 100', 230'   Assistive device Rolling walker  Gait Pattern Step-through pattern;Decreased dorsiflexion - left;Left steppage;Right flexed knee in stance;Left flexed knee in stance;Decreased trunk rotation;Decreased stride length;Decreased step length - right   Ambulation Surface Level;Indoor   Gait velocity 1.18ft/sec.   Standardized Balance Assessment   Standardized Balance Assessment Berg Balance Test   Berg Balance Test   Sit to Stand Able to stand  independently using hands   Standing Unsupported Able to stand safely 2 minutes   Sitting with Back Unsupported but Feet Supported on Floor or Stool Able to sit safely and securely 2 minutes   Stand to Sit Sits safely with minimal  use of hands   Transfers Able to transfer safely, definite need of hands   Standing Unsupported with Eyes Closed Able to stand 10 seconds safely   Standing Ubsupported with Feet Together Able to place feet together independently and stand for 1 minute with supervision   From Standing, Reach Forward with Outstretched Arm Can reach confidently >25 cm (10")   From Standing Position, Pick up Object from [place] to pick up shoe safely and easily   From Standing Position, Turn to Look Behind Over each Shoulder Looks behind from both sides and weight shifts well   Turn 360 Degrees Needs close supervision or verbal cueing   Standing Unsupported, Alternately Place Feet on Step/Stool Able to complete 4 steps without aid or supervision   Standing Unsupported, One Foot in Front Able to plae foot ahead of the other independently and hold 30 seconds   Standing on One Leg Tries to lift leg/unable to hold 3 seconds but remains standing independently   Total Score 44                PT Education - 10/12/14 1232    Education provided Yes   Education Details PT discussed renewing PT for addtional 5 weeks to improve balance, strength, endurance, and safety. PT also discussed BERG and gait speed results.   Person(s) Educated Patient   Methods Explanation   Comprehension Verbalized understanding          PT Short Term Goals - 08/08/14 0953    PT SHORT TERM GOAL #1   Title Pt will be independent in HEP to improve functional mobility. Target date: 08/08/14.   Status Partially Met   PT SHORT TERM GOAL #2   Title Asses BERG and write appropriate STG and LTG. Target date: 08/08/14.   Status Achieved   PT SHORT TERM GOAL #3   Title Pt will ambulated 300', with LRAD, over even/uneven terrain with supervision and no LOB to improve functional mobillity. Target date: 08/08/14.   Status Achieved   PT SHORT TERM GOAL #4   Title Pt will report zero falls in the last 4 weeks to improve safety during functional  mobility. Target date: 08/08/14.   Baseline No falls in last 4 weeks.   Status Achieved   PT SHORT TERM GOAL #5   Title Pt will improve BERG balance score to >/=41/56 to decrease falls risk. Target date: 08/08/14.   Status Achieved           PT Long Term Goals - 10/12/14 1235    PT LONG TERM GOAL #1   Title Pt will verbalize understanding of falls prevention strategies to decrease falls risk. Target date: 09/05/14.   Baseline PT is requesting another additonal 5 weeks of physical therapy: all unmet goals will be carried over to 11/16/14.   Status Achieved   PT LONG TERM GOAL #2  Title Pt will ambulate 600', with LRAD, over even/uneven terrain at MOD I level in order to improve functional mobility at home and at work. Target date: 11/16/14.   Status On-going   PT LONG TERM GOAL #3   Title Have pt complete FOTO and write appropriate goal. Target date: 09/05/14.   Status Achieved   PT LONG TERM GOAL #4   Title Pt will perform sit<>stand x10 with no UE support to improve safety during functional mobility. Target date: 11/16/14.   Status On-going   PT LONG TERM GOAL #5   Title Pt will improve gait speed, with LRAD, to >/=2.13f/sec. to ambulate safely in the community. Target date: 11/16/14.   Baseline --  1.66 ft/sec   Status On-going   PT LONG TERM GOAL #6   Title Pt will improve BERG balance score to >/=48/56 to decrease falls risk. Target date: 11/16/14.   Baseline Revised from 45/56 to 48/56 on 10/12/14.   Status Revised   PT LONG TERM GOAL #7   Title Pt will improve ABC score by 10% to improve quality of life. Target date: 11/16/14.   Status On-going               Plan - 10/12/14 1233    Clinical Impression Statement Pt demonstrated progress, as his gait speed with RW improve to >1.813fsec, decreasing his falls risk. Pt's BERG score of 44/56 still indicates he is at a significant risk for falls. PT is requesting additional 5 weeks of PT, due to pt missing appointments for medical  reasons. Pt would continue to benefit from skilled PT to improve safety during funcitonal mobillity.   Pt will benefit from skilled therapeutic intervention in order to improve on the following deficits Abnormal gait;Impaired flexibility;Decreased range of motion;Decreased endurance;Decreased knowledge of use of DME;Decreased balance;Decreased mobility;Decreased strength   Rehab Potential Good   PT Frequency 2x / week   PT Duration --  18weeks, original POC 8 weeks   PT Treatment/Interventions ADLs/Self Care Home Management;Gait training;Neuromuscular re-education;Biofeedback;Functional mobility training;Patient/family education;Therapeutic activities;Therapeutic exercise;Manual techniques;Balance training;DME Instruction   PT Next Visit Plan Dynamic gait with SPC/RW (outdoors) and dynamic balance.   PT Home Exercise Plan Balance and strength HEP.    Consulted and Agree with Plan of Care Patient        Problem List Patient Active Problem List   Diagnosis Date Noted  . TIA (transient ischemic attack) 09/25/2014  . Left arm numbness 09/25/2014  . 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 Hypotension 05/22/2014  . Hypoalbuminemia 05/22/2014  . Small cell carcinoma of prostate   . Vision disturbance   . Cerebral meningioma   . Left hemiparesis 04/09/2014  . Brain metastases 04/06/2014  . Poor compliance with F/U 02/04/2014  . 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 Medication management 08/20/2013  . Hypertension   . Hyperlipidemia   . GERD (gastroesophageal reflux disease)   . Vitamin D deficiency   . History of colon polyps     Kade Rickels L 10/12/2014, 12:38 PM  [address]one: [phone] Fax:  [phone]   JeGeoffry ParadisePT,DPT 10/12/2014 12:38 PM Phone: [phone]: [phone]

## 2014-10-15 ENCOUNTER — Ambulatory Visit (HOSPITAL_BASED_OUTPATIENT_CLINIC_OR_DEPARTMENT_OTHER): Payer: Medicare Other | Admitting: Oncology

## 2014-10-15 ENCOUNTER — Telehealth: Payer: Self-pay | Admitting: Oncology

## 2014-10-15 VITALS — BP 135/67 | HR 84 | Temp 97.7°F | Resp 18 | Ht 75.0 in | Wt 182.8 lb

## 2014-10-15 DIAGNOSIS — C7931 Secondary malignant neoplasm of brain: Secondary | ICD-10-CM

## 2014-10-15 DIAGNOSIS — C61 Malignant neoplasm of prostate: Secondary | ICD-10-CM

## 2014-10-15 NOTE — Telephone Encounter (Signed)
Gave and printed appt sched and avs for pt for JUNE °

## 2014-10-15 NOTE — Progress Notes (Signed)
Ivan Stark returns as scheduled. He completed cycle 5 etoposide/carboplatin beginning 09/24/2014. He reports feeling well. No pain. He is  Participating in physical therapy twice per week. He has to urinate frequently. No difficulty with bowel function. He is ambulate with a walker. Good appetite.  Objective:  Vital signs in last 24 hours:  Blood pressure 135/67, pulse 84, temperature 97.7 F (36.5 C), temperature source Oral, resp. rate 18, height 6\' 3"  (1.905 m), weight 182 lb 12.8 oz (82.918 kg), SpO2 99 %.    HEENT:  No thrush or ulcers Lymphatics:  No cervical, supraclavicular, or inguinal nodes Resp:  Lungs clear bilaterally Cardio:  Regular rate and rhythm GI:  No hepatosplenomegaly, nontender, no mass Vascular:  No leg edema Neuro: he is able to ambulate to the exam table. No significant weakness of the left arm or leg.       Lab Results:   none today  Imaging:  CTs of the chest, abdomen, and pelvis on 10/10/2014- bilateral adrenal nodules are larger , the polypoid lesion in the cecum is not visualized, no pathologically enlarged lymph nodes. A previously noted presacral lymph node has resolved. The prostate appears normal.   I reviewed the CT images with Ivan Stark  Medications: I have reviewed the patient's current medications.  Assessment/Plan: 1. 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, status post resection 04/06/2014  Staging CTs of the chest, abdomen, and pelvis 04/12/2014 confirmed a prostate mass  Prostate ultrasound 04/16/2014 confirmed a prostate mass with a biopsy consistent with small cell carcinoma  Extrinsic appearing cecal mass confirmed on colonoscopy 04/19/2014 with a biopsy revealing small cell carcinoma  Whole brain and prostate radiation 04/24/2014 through 05/16/2014  Cycle 1  carboplatin/etoposide beginning 05/22/2014 with Neulasta support  Cycle 2 carboplatin/etoposide 06/19/2014  Cycle 3 carboplatin/etoposide 07/17/2014  Restaging CT scans 08/10/2014 with near-complete resolution of the large mass involving the prostate gland; resolution of inferior perirectal and right ileocolonic mesenteric lymph nodes; progressive right internal iliac/presacral adenopathy and new small bilateral adrenal nodules; persistent soft tissue nodule involving the medial wall of the cecum.  Cycle 4 carboplatin/etoposide 08/14/2014  MRI brain 09/18/2014 with interval collapse of the bilateral posterior frontal resection cavities. Minimal enhancement at the right frontal resection site has decreased. Increased enhancement along the margins of the left frontal resection cavity favored to be postoperative. Small amount of new non-mass-like enhancement more posteriorly in the paramedian right frontoparietal region likely postoperative/post ischemic. Decreased size of 7 mm falcine mass. No evidence of new intracranial metastases.   cycle 5 carboplatin/etoposide 09/24/2014   CTs 10/10/2014 revealed enlargement of bilateral adrenal nodules, no other evidence of disease progression 2. Left frontal meningioma, WHO grade 1, status post resection 04/06/2014 3. Left leg weakness secondary to #1 4. 1 cmbrain lesion at the inferior falx noted on the MRI 03/21/2014, not resected 5. Diabetes  6. Hypertension  7. Hyperlipidemia 8. Neutropenia following cycle 1 carboplatin/etoposide.    Disposition:   Ivan Stark appears well. He completed 5 cycles of carboplatin /etoposide chemotherapy. Bilateral adrenal nodules are enlarging. There is no other evidence of  Progressive metastatic disease. I recommend discontinuing systemic therapy for now.   I reviewed the CT images with Ivan Stark and Ivan Stark. They understand the adrenal lesions likely represent metastases and will progress in the future. He  will take a treatment break  and we will plan a restaging CT of the abdomen/pelvis in a few months. We will consider salvage chemotherapy if there is further progression of the adrenal lesions or other evidence of metastatic disease.   I will contact Dr. Tammi Klippel to discuss the indication for  Radiation to the adrenal glands. I think it is  very unlikely this would impact on Ivan survival, but I will discuss this option with Dr. Tammi Klippel.  Betsy Coder, MD  10/15/2014  4:00 PM

## 2014-10-21 ENCOUNTER — Other Ambulatory Visit: Payer: Self-pay | Admitting: Oncology

## 2014-10-24 ENCOUNTER — Ambulatory Visit: Payer: Medicare Other | Admitting: Physical Therapy

## 2014-10-24 DIAGNOSIS — R269 Unspecified abnormalities of gait and mobility: Secondary | ICD-10-CM

## 2014-10-24 DIAGNOSIS — C61 Malignant neoplasm of prostate: Secondary | ICD-10-CM | POA: Diagnosis not present

## 2014-10-24 NOTE — Patient Instructions (Signed)
ANKLE: Dorsiflexion (Band)   Sit at edge of surface. Place band around top of foot. Keeping heel on floor, raise toes of banded foot. Hold 3 seconds. Use yellow band. 10 reps per set, 3 sets per day.  Repeat on other leg.  Copyright  VHI. All rights reserved.

## 2014-10-24 NOTE — Therapy (Signed)
[facility] [address] [place], [place], [address] Phone: [phone]   Fax:  [phone]  Physical Therapy Treatment  Patient Details  Name: Ivan Stark MRN: [id number] Date of Birth: [birth year] Referring Provider:  Unk Pinto, MD  Encounter Date: 10/24/2014      PT End of Session - 10/24/14 0825    Visit Number 14   Number of Visits 17   Date for PT Re-Evaluation 10/14/14   Authorization Type G-code every 10th visit.   Equipment Utilized During Treatment Gait belt   Activity Tolerance Patient tolerated treatment well   Behavior During Therapy WFL for tasks assessed/performed      Past Medical History  Diagnosis Date  . Hypertension   . Hyperlipidemia   . GERD (gastroesophageal reflux disease)   . Vitamin D deficiency   . History of colon polyps   . Complication of anesthesia     Difficult to wake up; and severe nausea and vomiting  . PONV (postoperative nausea and vomiting)   . Diabetes mellitus without complication     metformin  . Cancer 03/2014    prostate ca    Past Surgical History  Procedure Laterality Date  . Tonsillectomy    . Colonoscopy w/ polypectomy    . Esophagus surgery      Had esophagus stretched due to food getting trapped in his throat  . Craniotomy Bilateral 04/06/2014    Procedure: Bilateral Fronto-parietal craniotomy for resection of tumors with Curve;  Surgeon: Consuella Lose, MD;  Location: [facility];  Service: Neurosurgery;  Laterality: Bilateral;  Bilateral Fronto-parietal craniotomy for resection of tumors with Curve  . Colonoscopy N/A 04/19/2014    Procedure: COLONOSCOPY;  Surgeon: Winfield Cunas., MD;  Location: [facility]COPY;  Service: Endoscopy;  Laterality: N/A;    There were no vitals filed for this visit.  Visit Diagnosis:  Abnormality of gait      Subjective Assessment - 10/24/14 0812    Subjective Pt had a CT scan that was clear and so he  doesnt have to go back for 6 weeks.  Feeling bertter  Has been doing his exercises.   Pertinent History Active prostate cancer with brain metastasis   Patient Stated Goals Walk without RW   Currently in Pain? No/denies         Gait outdoors with SPC x >500'  With min guard assist with several bouts of LOB and needed min assist to recover.  L foot drags/catches during ambulation.   Called both Restaurant manager, fast food to find out who provided pt's leather toe cap while in Rehab.  It was provided by [employer].  Gave pt information on [employer] and how to obtain new toe cap.  Current one is worn and sole of shoe now showing thru which is decreasing foot clearance on the L.            PT Short Term Goals - 08/08/14 0953    PT SHORT TERM GOAL #1   Title Pt will be independent in HEP to improve functional mobility. Target date: 08/08/14.   Status Partially Met   PT SHORT TERM GOAL #2   Title Asses BERG and write appropriate STG and LTG. Target date: 08/08/14.   Status Achieved   PT SHORT TERM GOAL #3   Title Pt will ambulated 300', with LRAD, over even/uneven terrain with supervision and no LOB to improve functional mobillity. Target date: 08/08/14.   Status Achieved   PT  SHORT TERM GOAL #4   Title Pt will report zero falls in the last 4 weeks to improve safety during functional mobility. Target date: 08/08/14.   Baseline No falls in last 4 weeks.   Status Achieved   PT SHORT TERM GOAL #5   Title Pt will improve BERG balance score to >/=41/56 to decrease falls risk. Target date: 08/08/14.   Status Achieved           PT Long Term Goals - 10/12/14 1235    PT LONG TERM GOAL #1   Title Pt will verbalize understanding of falls prevention strategies to decrease falls risk. Target date: 09/05/14.   Baseline PT is requesting another additonal 5 weeks of physical therapy: all unmet goals will be carried over to 11/16/14.   Status Achieved   PT LONG TERM GOAL #2   Title Pt will ambulate 600', with LRAD,  over even/uneven terrain at MOD I level in order to improve functional mobility at home and at work. Target date: 11/16/14.   Status On-going   PT LONG TERM GOAL #3   Title Have pt complete FOTO and write appropriate goal. Target date: 09/05/14.   Status Achieved   PT LONG TERM GOAL #4   Title Pt will perform sit<>stand x10 with no UE support to improve safety during functional mobility. Target date: 11/16/14.   Status On-going   PT LONG TERM GOAL #5   Title Pt will improve gait speed, with LRAD, to >/=2.37f/sec. to ambulate safely in the community. Target date: 11/16/14.   Baseline --  1.66 ft/sec   Status On-going   PT LONG TERM GOAL #6   Title Pt will improve BERG balance score to >/=48/56 to decrease falls risk. Target date: 11/16/14.   Baseline Revised from 45/56 to 48/56 on 10/12/14.   Status Revised   PT LONG TERM GOAL #7   Title Pt will improve ABC score by 10% to improve quality of life. Target date: 11/16/14.   Status On-going               Problem List Patient Active Problem List   Diagnosis Date Noted  . TIA (transient ischemic attack) 09/25/2014  . Left arm numbness 09/25/2014  . 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 Hypotension 05/22/2014  . Hypoalbuminemia 05/22/2014  . Small cell carcinoma of prostate   . Vision disturbance   . Cerebral meningioma   . Left hemiparesis 04/09/2014  . Brain metastases 04/06/2014  . Poor compliance with F/U 02/04/2014  . 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 Medication management 08/20/2013  . Hypertension   . Hyperlipidemia   . GERD (gastroesophageal reflux disease)   . Vitamin D deficiency   . History of colon polyps     RNarda Bonds5/18/2016, 8:25 AM  [place] [address]Phone: [phone]  Fax:  3[place] PJay05/18/2016 1:35 PM Phone: [phone]: [phone]

## 2014-10-26 ENCOUNTER — Ambulatory Visit: Payer: Medicare Other

## 2014-10-26 DIAGNOSIS — C61 Malignant neoplasm of prostate: Secondary | ICD-10-CM | POA: Diagnosis not present

## 2014-10-26 DIAGNOSIS — R269 Unspecified abnormalities of gait and mobility: Secondary | ICD-10-CM

## 2014-10-26 NOTE — Patient Instructions (Signed)
Perform balance exercises in a corner with a chair in front of you for safety:   Feet Together, Varied Arm Positions - Eyes Closed   Stand with feet together and arms out. Close eyes and visualize upright position. Hold __15-30__ seconds. Repeat __3__ times per session. Do __1__ sessions per day.  Copyright  VHI. All rights reserved.  Feet Heel-Toe "Tandem", Varied Arm Positions - Eyes Open   With eyes open, right foot directly in front of the other, arms at your side, look straight ahead at a stationary object. Hold __15-30__ seconds. Repeat with your left foot in front. Repeat ___3_ times per session. Do __1__ sessions per day.  Copyright  VHI. All rights reserved.  Single Leg - Eyes Open   Holding support, lift right leg while maintaining balance over other leg. Progress to removing hands from support surface for longer periods of time. Repeat with left leg lifted. Hold___10-30_ seconds. Repeat __3__ times per session. Do __1__ sessions per day.  Copyright  VHI. All rights reserved.  Feet Apart (Compliant Surface) Head Motion - Eyes Open   With eyes open, standing on compliant surface: __pillow______, feet shoulder width apart, move head slowly: up and down and side to side for 30 seconds. Repeat __3__ times per session. Do _1___ sessions per day.  Copyright  VHI. All rights reserved.    **Use RED band for ankle dorsiflexion exercise.** ANKLE: Dorsiflexion (Band)   Sit at edge of surface. Place band around top of foot. Keeping heel on floor, raise toes of banded foot. Hold 3 seconds. Use red band. 10 reps per set, 3 sets per day. Repeat on other leg.  Bridge   Lying on back, legs bent 90, feet flat on floor. Press up hips and torso. Perform 2 sets of 10 each day. Make sure your hips are even (Left hip tends to dip down).  Copyright  VHI. All rights reserved.    Hamstring Curl: Resisted (Prone)   Lying on your stomach with leg straight, bend knee. Repeat  with other leg. Don't use weights right now. Repeat __10__ times per set. Do __2__ sets per session per leg. Do __3__ sessions per week. Progress by performing 3 sets of 10.

## 2014-10-26 NOTE — Therapy (Signed)
[facility] [address] [place], [place], [address] Phone: [phone]   Fax:  [phone]  Physical Therapy Treatment  Patient Details  Name: Ivan Stark MRN: [id number] Date of Birth: March 09, 1946 Referring Provider:  Unk Pinto, MD  Encounter Date: 10/26/2014      PT End of Session - 10/26/14 1618    Visit Number 16   Number of Visits 22   Date for PT Re-Evaluation 11/16/14   Authorization Type G-code every 10th visit.   PT Start Time 1530   PT Stop Time 1614   PT Time Calculation (min) 44 min   Equipment Utilized During Treatment Gait belt   Activity Tolerance Patient tolerated treatment well   Behavior During Therapy WFL for tasks assessed/performed      Past Medical History  Diagnosis Date  . Hypertension   . Hyperlipidemia   . GERD (gastroesophageal reflux disease)   . Vitamin D deficiency   . History of colon polyps   . Complication of anesthesia     Difficult to wake up; and severe nausea and vomiting  . PONV (postoperative nausea and vomiting)   . Diabetes mellitus without complication     metformin  . Cancer 03/2014    prostate ca    Past Surgical History  Procedure Laterality Date  . Tonsillectomy    . Colonoscopy w/ polypectomy    . Esophagus surgery      Had esophagus stretched due to food getting trapped in his throat  . Craniotomy Bilateral 04/06/2014    Procedure: Bilateral Fronto-parietal craniotomy for resection of tumors with Curve;  Surgeon: Consuella Lose, MD;  Location: [facility];  Service: Neurosurgery;  Laterality: Bilateral;  Bilateral Fronto-parietal craniotomy for resection of tumors with Curve  . Colonoscopy N/A 04/19/2014    Procedure: COLONOSCOPY;  Surgeon: Winfield Cunas., MD;  Location: [facility] ENDOS[facility];  Service: Endoscopy;  Laterality: N/A;    There were no vitals filed for this visit.  Visit Diagnosis:  Abnormality of gait      Subjective  Assessment - 10/26/14 1532    Subjective Pt reported he fell at his grandchild's program, he was walking in the bathroom and fell while trying to bend down to pick up something off the floor. His son-in-law helped him up. Pt reported it was more of an issue getting up and that he didn't really far. Pt denied hitting head and has no pain.   Pertinent History Active prostate cancer with brain metastasis   Patient Stated Goals Walk without RW   Currently in Pain? No/denies      Neuro re-ed and therex: reviewed HEP and progressed HEP.  Pt performed all exercises prescribed during session. Please see patient instructions for details.                           PT Education - 10/26/14 1616    Education provided Yes   Education Details Reviewed strengthening and balance HEP.   Person(s) Educated Patient   Methods Explanation;Demonstration;Tactile cues;Verbal cues;Handout   Comprehension Verbalized understanding;Returned demonstration          PT Short Term Goals - 08/08/14 0953    PT SHORT TERM GOAL #1   Title Pt will be independent in HEP to improve functional mobility. Target date: 08/08/14.   Status Partially Met   PT SHORT TERM GOAL #2   Title Asses BERG and write appropriate STG and LTG. Target  date: 08/08/14.   Status Achieved   PT SHORT TERM GOAL #3   Title Pt will ambulated 300', with LRAD, over even/uneven terrain with supervision and no LOB to improve functional mobillity. Target date: 08/08/14.   Status Achieved   PT SHORT TERM GOAL #4   Title Pt will report zero falls in the last 4 weeks to improve safety during functional mobility. Target date: 08/08/14.   Baseline No falls in last 4 weeks.   Status Achieved   PT SHORT TERM GOAL #5   Title Pt will improve BERG balance score to >/=41/56 to decrease falls risk. Target date: 08/08/14.   Status Achieved           PT Long Term Goals - 10/12/14 1235    PT LONG TERM GOAL #1   Title Pt will verbalize  understanding of falls prevention strategies to decrease falls risk. Target date: 09/05/14.   Baseline PT is requesting another additonal 5 weeks of physical therapy: all unmet goals will be carried over to 11/16/14.   Status Achieved   PT LONG TERM GOAL #2   Title Pt will ambulate 600', with LRAD, over even/uneven terrain at MOD I level in order to improve functional mobility at home and at work. Target date: 11/16/14.   Status On-going   PT LONG TERM GOAL #3   Title Have pt complete FOTO and write appropriate goal. Target date: 09/05/14.   Status Achieved   PT LONG TERM GOAL #4   Title Pt will perform sit<>stand x10 with no UE support to improve safety during functional mobility. Target date: 11/16/14.   Status On-going   PT LONG TERM GOAL #5   Title Pt will improve gait speed, with LRAD, to >/=2.47f/sec. to ambulate safely in the community. Target date: 11/16/14.   Baseline --  1.66 ft/sec   Status On-going   PT LONG TERM GOAL #6   Title Pt will improve BERG balance score to >/=48/56 to decrease falls risk. Target date: 11/16/14.   Baseline Revised from 45/56 to 48/56 on 10/12/14.   Status Revised   PT LONG TERM GOAL #7   Title Pt will improve ABC score by 10% to improve quality of life. Target date: 11/16/14.   Status On-going               Plan - 10/26/14 1618    Clinical Impression Statement Pt demonstrated progress as he was able to progress standing balance activities to compliant surfaces and increase reps and resistance during therex, indicating improved balance, endurance and strength. Continue with POC.   Pt will benefit from skilled therapeutic intervention in order to improve on the following deficits Abnormal gait;Impaired flexibility;Decreased range of motion;Decreased endurance;Decreased knowledge of use of DME;Decreased balance;Decreased mobility;Decreased strength   Rehab Potential Good   PT Frequency 2x / week   PT Duration Other (comment)  original 8 week POC, with  2 extended 5 weeks POC due to illness.   PT Next Visit Plan Review clamshells and hip ext (prone) HEP and progress as tolerated. Dynamic gait/balance. F/u on leather shoe cap as pt forgot about it.   PT Home Exercise Plan Balance and strength HEP.    Consulted and Agree with Plan of Care Patient        Problem List Patient Active Problem List   Diagnosis Date Noted  . TIA (transient ischemic attack) 09/25/2014  . Left arm numbness 09/25/2014  . 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 Hypotension 05/22/2014  .  Hypoalbuminemia 05/22/2014  . Small cell carcinoma of prostate   . Vision disturbance   . Cerebral meningioma   . Left hemiparesis 04/09/2014  . Brain metastases 04/06/2014  . Poor compliance with F/U 02/04/2014  . 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 Medication management 08/20/2013  . Hypertension   . Hyperlipidemia   . GERD (gastroesophageal reflux disease)   . Vitamin D deficiency   . History of colon polyps     Vanderbilt Ranieri L 10/26/2014, 4:29 PM  [facility] [address] [place], [place], [address] Phone: [phone]   Fax:  [phone]     Geoffry Paradise, PT,DPT 10/26/2014 4:29 PM Phone: [phone] Fax: [phone]

## 2014-10-31 ENCOUNTER — Ambulatory Visit: Payer: Medicare Other

## 2014-10-31 VITALS — BP 109/71 | HR 80

## 2014-10-31 DIAGNOSIS — C61 Malignant neoplasm of prostate: Secondary | ICD-10-CM | POA: Diagnosis not present

## 2014-10-31 DIAGNOSIS — R269 Unspecified abnormalities of gait and mobility: Secondary | ICD-10-CM

## 2014-10-31 NOTE — Therapy (Signed)
[facility] [address] [place], [place], [address] Phone: [phone]   Fax:  [phone]  Physical Therapy Treatment  Patient Details  Name: Ivan Stark MRN: [id number] Date of Birth: 11-Feb-1946 Referring Provider:  Unk Pinto, MD  Encounter Date: 10/31/2014      PT End of Session - 10/31/14 1120    Visit Number 17   Number of Visits 22   Date for PT Re-Evaluation 11/16/14   Authorization Type G-code every 10th visit.   PT Start Time 0803   PT Stop Time 0841   PT Time Calculation (min) 38 min   Equipment Utilized During Treatment Gait belt   Activity Tolerance Patient tolerated treatment well   Behavior During Therapy WFL for tasks assessed/performed      Past Medical History  Diagnosis Date  . Hypertension   . Hyperlipidemia   . GERD (gastroesophageal reflux disease)   . Vitamin D deficiency   . History of colon polyps   . Complication of anesthesia     Difficult to wake up; and severe nausea and vomiting  . PONV (postoperative nausea and vomiting)   . Diabetes mellitus without complication     metformin  . Cancer 03/2014    prostate ca    Past Surgical History  Procedure Laterality Date  . Tonsillectomy    . Colonoscopy w/ polypectomy    . Esophagus surgery      Had esophagus stretched due to food getting trapped in his throat  . Craniotomy Bilateral 04/06/2014    Procedure: Bilateral Fronto-parietal craniotomy for resection of tumors with Curve;  Surgeon: Consuella Lose, MD;  Location: [facility];  Service: Neurosurgery;  Laterality: Bilateral;  Bilateral Fronto-parietal craniotomy for resection of tumors with Curve  . Colonoscopy N/A 04/19/2014    Procedure: COLONOSCOPY;  Surgeon: Winfield Cunas., MD;  Location: [facility];  Service: Endoscopy;  Laterality: N/A;    Filed Vitals:   10/31/14 0807  BP: 109/71  Pulse: 80    Visit Diagnosis:  Abnormality of gait       Subjective Assessment - 10/31/14 0807    Subjective Pt reported he does not feel well this morning, he had a headache last night and felt nauseated. He's not sure how much he can do today but he'd like to try. Pt has not taken BP meds yet this morning. Pt reported he felt very tired after long day yesterday. Pt reported he will f/u on leather shoe cap on Friday.   Pertinent History Active prostate cancer with brain metastasis   Patient Stated Goals Walk without RW   Currently in Pain? Yes   Pain Score 1    Pain Location --  headache   Pain Descriptors / Indicators Aching   Pain Type Acute pain   Pain Onset Yesterday   Pain Frequency Intermittent   Aggravating Factors  none   Pain Relieving Factors medication      Therex: -Prone B hip extension 2x10/LE. Cues to keep hips on mat. -Sidelying B clamshells 2x10/LE. Cues to keep hips forward. Pt required rest breaks due to fatigue.                   [facility] Adult PT Treatment/Exercise - 10/31/14 0823    Ambulation/Gait   Ambulation/Gait Yes   Ambulation/Gait Assistance 5: Supervision   Ambulation/Gait Assistance Details Cues to improve upright posture and B heel strike and stride length. Pt did not require cues for sequencing  with SPC. Pt required seated rest breaks after each bout of ambulation 2/2 fatigue.   Ambulation Distance (Feet) --  75', 230 and 125' with RW; 150' with [facility]   Assistive device Rolling walker;Straight cane   Gait Pattern Step-through pattern;Decreased dorsiflexion - left;Left steppage;Right flexed knee in stance;Left flexed knee in stance;Decreased trunk rotation;Decreased stride length;Decreased step length - right   Ambulation Surface Level;Indoor                PT Education - 10/31/14 1119    Education provided Yes   Education Details Reviewed strengthening HEP. PT educated pt on energy conservation and the importance of staying hydrated during hot weather, as pt reported he was nauseated  and had a headache last night after running errands all day in the heat.   Person(s) Educated Patient   Methods Explanation   Comprehension Verbalized understanding          PT Short Term Goals - 08/08/14 0953    PT SHORT TERM GOAL #1   Title Pt will be independent in HEP to improve functional mobility. Target date: 08/08/14.   Status Partially Met   PT SHORT TERM GOAL #2   Title Asses BERG and write appropriate STG and LTG. Target date: 08/08/14.   Status Achieved   PT SHORT TERM GOAL #3   Title Pt will ambulated 300', with LRAD, over even/uneven terrain with supervision and no LOB to improve functional mobillity. Target date: 08/08/14.   Status Achieved   PT SHORT TERM GOAL #4   Title Pt will report zero falls in the last 4 weeks to improve safety during functional mobility. Target date: 08/08/14.   Baseline No falls in last 4 weeks.   Status Achieved   PT SHORT TERM GOAL #5   Title Pt will improve BERG balance score to >/=41/56 to decrease falls risk. Target date: 08/08/14.   Status Achieved           PT Long Term Goals - 10/12/14 1235    PT LONG TERM GOAL #1   Title Pt will verbalize understanding of falls prevention strategies to decrease falls risk. Target date: 09/05/14.   Baseline PT is requesting another additonal 5 weeks of physical therapy: all unmet goals will be carried over to 11/16/14.   Status Achieved   PT LONG TERM GOAL #2   Title Pt will ambulate 600', with LRAD, over even/uneven terrain at MOD I level in order to improve functional mobility at home and at work. Target date: 11/16/14.   Status On-going   PT LONG TERM GOAL #3   Title Have pt complete FOTO and write appropriate goal. Target date: 09/05/14.   Status Achieved   PT LONG TERM GOAL #4   Title Pt will perform sit<>stand x10 with no UE support to improve safety during functional mobility. Target date: 11/16/14.   Status On-going   PT LONG TERM GOAL #5   Title Pt will improve gait speed, with LRAD, to  >/=2.61f/sec. to ambulate safely in the community. Target date: 11/16/14.   Baseline --  1.66 ft/sec   Status On-going   PT LONG TERM GOAL #6   Title Pt will improve BERG balance score to >/=48/56 to decrease falls risk. Target date: 11/16/14.   Baseline Revised from 45/56 to 48/56 on 10/12/14.   Status Revised   PT LONG TERM GOAL #7   Title Pt will improve ABC score by 10% to improve quality of life. Target date: 11/16/14.  Status On-going               Plan - 10/31/14 1121    Clinical Impression Statement Pt demonstrated progress as he was able to tolerate ambulating with SPC with min guard, and did not require cues for sequencing. Pt was limited today by fatigue, as he required seated rest breaks. Therabands not added to hip abd/ext mat therex, as pt required breaks to decrease compensation while performing therex against gravity. Continue with POC.   Pt will benefit from skilled therapeutic intervention in order to improve on the following deficits Abnormal gait;Impaired flexibility;Decreased range of motion;Decreased endurance;Decreased knowledge of use of DME;Decreased balance;Decreased mobility;Decreased strength   Rehab Potential Good   PT Frequency 2x / week   PT Duration Other (comment)  18 weeks   PT Treatment/Interventions ADLs/Self Care Home Management;Gait training;Neuromuscular re-education;Biofeedback;Functional mobility training;Patient/family education;Therapeutic activities;Therapeutic exercise;Manual techniques;Balance training;DME Instruction   PT Next Visit Plan Dynamic gait/balance training with SPC. Remind pt to go to [employer] for leather shoe cap, as he stated he would go on Friday (11/02/14).   PT Home Exercise Plan Balance and strength HEP.    Consulted and Agree with Plan of Care Patient        Problem List Patient Active Problem List   Diagnosis Date Noted  . TIA (transient ischemic attack) 09/25/2014  . Left arm numbness 09/25/2014  . T2_NIDDM w/  peripheral sensory neuropathy 09/05/2014  . Hypotension 05/22/2014  . Hypoalbuminemia 05/22/2014  . Small cell carcinoma of prostate   . Vision disturbance   . Cerebral meningioma   . Left hemiparesis 04/09/2014  . Brain metastases 04/06/2014  . Poor compliance with F/U 02/04/2014  . 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 Medication management 08/20/2013  . Hypertension   . Hyperlipidemia   . GERD (gastroesophageal reflux disease)   . Vitamin D deficiency   . History of colon polyps     Fortunato Nordin L 10/31/2014, 11:24 AM  [facility] [address] [place], [place], [address] Phone: [phone]   Fax:  [phone]     Geoffry Paradise, PT,DPT 10/31/2014 11:24 AM Phone: [phone] Fax: [phone]

## 2014-11-07 ENCOUNTER — Ambulatory Visit: Payer: Medicare Other

## 2014-11-09 ENCOUNTER — Telehealth: Payer: Self-pay

## 2014-11-09 DIAGNOSIS — E1140 Type 2 diabetes mellitus with diabetic neuropathy, unspecified: Secondary | ICD-10-CM

## 2014-11-09 DIAGNOSIS — G8194 Hemiplegia, unspecified affecting left nondominant side: Secondary | ICD-10-CM

## 2014-11-09 DIAGNOSIS — C7931 Secondary malignant neoplasm of brain: Secondary | ICD-10-CM

## 2014-11-09 NOTE — Telephone Encounter (Signed)
Order placed

## 2014-11-09 NOTE — Telephone Encounter (Signed)
Dr. Naaman Plummer  [name]'s original L leather shoe cap is worn and no longer effective. I feel that he would benefit from another L leather shoe cap in order to decrease falls risk due to decreased L ankle dorsiflexion/decreased L toe clearance during ambulation. Please fax [place] an order for the shoe cap if you agree.  Thank you, Geoffry Paradise, PT, DPT

## 2014-11-09 NOTE — Addendum Note (Signed)
Addended by: Alger Simons T on: 11/09/2014 01:33 PM   Modules accepted: Orders

## 2014-11-13 ENCOUNTER — Ambulatory Visit: Payer: Medicare Other

## 2014-11-16 ENCOUNTER — Ambulatory Visit: Payer: Medicare Other | Attending: Physical Medicine & Rehabilitation

## 2014-11-16 DIAGNOSIS — G8194 Hemiplegia, unspecified affecting left nondominant side: Secondary | ICD-10-CM

## 2014-11-16 DIAGNOSIS — G8190 Hemiplegia, unspecified affecting unspecified side: Secondary | ICD-10-CM | POA: Diagnosis present

## 2014-11-16 DIAGNOSIS — R269 Unspecified abnormalities of gait and mobility: Secondary | ICD-10-CM | POA: Diagnosis present

## 2014-11-16 NOTE — Therapy (Signed)
[facility] [address] [place], [place], [address] Phone: [phone]   Fax:  [phone]  Physical Therapy Treatment  Patient Details  Name: Ivan Stark MRN: [id number] Date of Birth: 1945/08/23 Referring Provider:  Unk Pinto, MD  Encounter Date: 11/16/2014      PT End of Session - 11/16/14 1404    Visit Number 18   Number of Visits 30   Date for PT Re-Evaluation 01/15/15  pt requesting additional 1x/week for 8 weeks   Authorization Type G-code every 10th visit.   PT Start Time 1316   PT Stop Time 1359   PT Time Calculation (min) 43 min   Equipment Utilized During Treatment Gait belt   Activity Tolerance Patient tolerated treatment well   Behavior During Therapy WFL for tasks assessed/performed      Past Medical History  Diagnosis Date  . Hypertension   . Hyperlipidemia   . GERD (gastroesophageal reflux disease)   . Vitamin D deficiency   . History of colon polyps   . Complication of anesthesia     Difficult to wake up; and severe nausea and vomiting  . PONV (postoperative nausea and vomiting)   . Diabetes mellitus without complication     metformin  . Cancer 03/2014    prostate ca    Past Surgical History  Procedure Laterality Date  . Tonsillectomy    . Colonoscopy w/ polypectomy    . Esophagus surgery      Had esophagus stretched due to food getting trapped in his throat  . Craniotomy Bilateral 04/06/2014    Procedure: Bilateral Fronto-parietal craniotomy for resection of tumors with Curve;  Surgeon: Consuella Lose, MD;  Location: [facility];  Service: Neurosurgery;  Laterality: Bilateral;  Bilateral Fronto-parietal craniotomy for resection of tumors with Curve  . Colonoscopy N/A 04/19/2014    Procedure: COLONOSCOPY;  Surgeon: Winfield Cunas., MD;  Location: [facility];  Service: Endoscopy;  Laterality: N/A;    There were no vitals filed for this visit.  Visit Diagnosis:   Abnormality of gait - Plan: PT plan of care cert/re-cert  Left hemiparesis - Plan: PT plan of care cert/re-cert      Subjective Assessment - 11/16/14 1324    Subjective Pt reported he had one fall when at the hotel in the [employer] last weekend, when he got up to the bathroom at night. He reported he experienced another fall in the living room last week,while putting suit coat on and a few "stumbles" when he was able to catch himself.    Pertinent History Active prostate cancer with brain metastasis   Patient Stated Goals Walk without RW   Currently in Pain? No/denies                         [facility] Adult PT Treatment/Exercise - 11/16/14 0001    Ambulation/Gait   Ambulation/Gait Yes   Ambulation/Gait Assistance 5: Supervision   Gait velocity 1.107f/sec.  with RW   Standardized Balance Assessment   Standardized Balance Assessment Berg Balance Test;Timed Up and Go Test   Berg Balance Test   Sit to Stand Able to stand  independently using hands   Standing Unsupported Able to stand safely 2 minutes   Sitting with Back Unsupported but Feet Supported on Floor or Stool Able to sit safely and securely 2 minutes   Stand to Sit Sits safely with minimal use of hands   Transfers Able  to transfer safely, definite need of hands   Standing Unsupported with Eyes Closed Able to stand 10 seconds with supervision   Standing Ubsupported with Feet Together Able to place feet together independently and stand for 1 minute with supervision   From Standing, Reach Forward with Outstretched Arm Can reach confidently >25 cm (10")   From Standing Position, Pick up Object from [place] to pick up shoe safely and easily   From Standing Position, Turn to Look Behind Over each Shoulder Looks behind one side only/other side shows less weight shift   Turn 360 Degrees Needs assistance while turning  able to turn to L side, but min-mod A during R side turn   Standing Unsupported, Alternately Place  Feet on Step/Stool Able to complete >2 steps/needs minimal assist   Standing Unsupported, One Foot in Front Able to take small step independently and hold 30 seconds   Standing on One Leg Tries to lift leg/unable to hold 3 seconds but remains standing independently   Total Score 39   Timed Up and Go Test   TUG Normal TUG   Normal TUG (seconds) 20.18  with RW                PT Education - 11/16/14 1401    Education provided Yes   Education Details PT discussed goal progress and the importance of performing HEP at home. PT explained outcome measure results. PT encouraged pt to continue PT for 8 additional weeks, 1x/week to improve balance, strength, and endurance.    Person(s) Educated Patient   Methods Explanation   Comprehension Verbalized understanding          PT Short Term Goals - 08/08/14 0953    PT SHORT TERM GOAL #1   Title Pt will be independent in HEP to improve functional mobility. Target date: 08/08/14.   Status Partially Met   PT SHORT TERM GOAL #2   Title Asses BERG and write appropriate STG and LTG. Target date: 08/08/14.   Status Achieved   PT SHORT TERM GOAL #3   Title Pt will ambulated 300', with LRAD, over even/uneven terrain with supervision and no LOB to improve functional mobillity. Target date: 08/08/14.   Status Achieved   PT SHORT TERM GOAL #4   Title Pt will report zero falls in the last 4 weeks to improve safety during functional mobility. Target date: 08/08/14.   Baseline No falls in last 4 weeks.   Status Achieved   PT SHORT TERM GOAL #5   Title Pt will improve BERG balance score to >/=41/56 to decrease falls risk. Target date: 08/08/14.   Status Achieved           PT Long Term Goals - 11/16/14 1409    PT LONG TERM GOAL #1   Title Pt will verbalize understanding of falls prevention strategies to decrease falls risk. Target date: 09/05/14.   Baseline PT is requesting another additonal 8 weeks of physical therapy: all unmet goals will be carried  over to 01/15/15.   Status Achieved   PT LONG TERM GOAL #2   Title Pt will ambulate 600', with LRAD, over even/uneven terrain at MOD I level in order to improve functional mobility at home and at work. Target date: 11/16/14.   Status Not Met   PT LONG TERM GOAL #3   Title Have pt complete FOTO and write appropriate goal. Target date: 09/05/14.   Status Achieved   PT LONG TERM GOAL #4   Title  Pt will perform sit<>stand x10 with no UE support to improve safety during functional mobility. Target date: 11/16/14.   Status On-going   PT LONG TERM GOAL #5   Title Pt will improve gait speed, with LRAD, to >/=2.81f/sec. to ambulate safely in the community. Target date: 11/16/14.   Baseline --  1.66 ft/sec   Status Not Met   PT LONG TERM GOAL #6   Title Pt will improve BERG balance score to >/=48/56 to decrease falls risk. Target date: 11/16/14.   Status Not Met   PT LONG TERM GOAL #7   Title Pt will improve ABC score by 10% to improve quality of life. Target date: 11/16/14.   Status On-going               Plan - 11/16/14 1404    Clinical Impression Statement Pt did not meet LTG 2,4,5, 6; this could be due to pt missing last 2 weeks of PT and is fearful of falling due to recent falls. BERG score, gait speed, and TUG time all indicate pt is at risk for falls. Pt would continue to benefit from skilled PT to improve balance, strength, balance, and safety during functional mobility. PT requesting additional 1x/week for 8 weeks of PT. PT provided pt with leather shoe cap prescription from MD.   Pt will benefit from skilled therapeutic intervention in order to improve on the following deficits Abnormal gait;Impaired flexibility;Decreased range of motion;Decreased endurance;Decreased knowledge of use of DME;Decreased balance;Decreased mobility;Decreased strength   Rehab Potential Good   PT Frequency 1x / week  decreased from 2x/week to 1x/week for new POC   PT Duration Other (comment)  26 weeks   PT  Treatment/Interventions ADLs/Self Care Home Management;Gait training;Neuromuscular re-education;Biofeedback;Functional mobility training;Patient/family education;Therapeutic activities;Therapeutic exercise;Manual techniques;Balance training;DME Instruction   PT Next Visit Plan Dynamic gait/balance training with SPC and RW.    PT Home Exercise Plan Balance and strength HEP.    Consulted and Agree with Plan of Care Patient        Problem List Patient Active Problem List   Diagnosis Date Noted  . TIA (transient ischemic attack) 09/25/2014  . Left arm numbness 09/25/2014  . 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 Hypotension 05/22/2014  . Hypoalbuminemia 05/22/2014  . Small cell carcinoma of prostate   . Vision disturbance   . Cerebral meningioma   . Left hemiparesis 04/09/2014  . Brain metastases 04/06/2014  . Poor compliance with F/U 02/04/2014  . 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 Medication management 08/20/2013  . Hypertension   . Hyperlipidemia   . GERD (gastroesophageal reflux disease)   . Vitamin D deficiency   . History of colon polyps     Miller,Jennifer L 11/16/2014, 2:17 PM  [place] [place] [address]Phone: [phone]  Fax:  [phone]    JGeoffry Paradise PT,DPT 11/16/2014 2:17 PM Phone: [phone]: [phone]

## 2014-11-19 ENCOUNTER — Telehealth: Payer: Self-pay | Admitting: *Deleted

## 2014-11-19 DIAGNOSIS — D320 Benign neoplasm of cerebral meninges: Secondary | ICD-10-CM

## 2014-11-19 DIAGNOSIS — G8194 Hemiplegia, unspecified affecting left nondominant side: Secondary | ICD-10-CM

## 2014-11-19 NOTE — Telephone Encounter (Signed)
Order written

## 2014-11-19 NOTE — Telephone Encounter (Signed)
Called patient to let him know that the order was placed today for the right shoe adjustment.

## 2014-11-19 NOTE — Telephone Encounter (Signed)
Patient called and left message stating that he had his appointment at [facility].  They told him that he needs to have his right shoe adjusted as well, but the need an RX for that. Called [facility] and spoke to [place] - needed a toe cap for left foot because of foot drop.  The right shoe also had excessive wear on the distal end of the shoe and [facility] feels it would benefit the patient to have the right shoe done as well.  Needs RX for the right shoe.

## 2014-11-20 ENCOUNTER — Telehealth: Payer: Self-pay | Admitting: Oncology

## 2014-11-20 ENCOUNTER — Telehealth: Payer: Self-pay

## 2014-11-20 ENCOUNTER — Ambulatory Visit (HOSPITAL_BASED_OUTPATIENT_CLINIC_OR_DEPARTMENT_OTHER): Payer: Medicare Other | Admitting: Nurse Practitioner

## 2014-11-20 ENCOUNTER — Ambulatory Visit: Payer: Medicare Other | Admitting: Physical Therapy

## 2014-11-20 VITALS — BP 141/82 | HR 72 | Temp 97.5°F | Resp 18 | Ht 75.0 in | Wt 182.3 lb

## 2014-11-20 DIAGNOSIS — C7931 Secondary malignant neoplasm of brain: Secondary | ICD-10-CM

## 2014-11-20 DIAGNOSIS — C61 Malignant neoplasm of prostate: Secondary | ICD-10-CM | POA: Diagnosis not present

## 2014-11-20 NOTE — Progress Notes (Addendum)
[facility] OFFICE PROGRESS NOTE   Diagnosis:  Small cell carcinoma of the prostate  INTERVAL HISTORY:   Ivan Stark returns as scheduled. He feels well. He reports a good appetite. He continues working with physical therapy. He is ambulatory with a walker. He has had a few falls. He denies any pain. No nausea or vomiting. Bowels moving regularly. No urinary symptoms. He denies fever. No shortness of breath. He continues to note decreased involving the right ear.  Objective:  Vital signs in last 24 hours:  Blood pressure 141/82, pulse 72, temperature 97.5 F (36.4 C), temperature source Oral, resp. rate 18, height 6\' 3"  (1.905 m), weight 182 lb 4.8 oz (82.691 kg), SpO2 100 %.    HEENT: No thrush or ulcers. Lymphatics: No palpable cervical, supra clavicular, axillary or inguinal lymph nodes. Resp: Lungs clear bilaterally. Cardio: Regular rate and rhythm. GI: Abdomen soft and nontender. No hepatomegaly. No mass. Vascular: Trace edema left lower leg/ankle. Trace right ankle edema. Calves soft and nontender. Neuro: Alert and oriented. Mild weakness left arm and leg.  Skin: No rash.    Lab Results:  Lab Results  Component Value Date   WBC 7.6 09/25/2014   HGB 12.6* 09/25/2014   HCT 37.0* 09/25/2014   MCV 87.1 09/25/2014   PLT 290 09/25/2014   NEUTROABS 5.1 09/25/2014    Imaging:  No results found.  Medications: I have reviewed the patient's current medications.  Assessment/Plan: 1. 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, status post resection 04/06/2014  Staging CTs of the chest, abdomen, and pelvis 04/12/2014 confirmed a prostate mass  Prostate ultrasound 04/16/2014 confirmed a prostate mass with a biopsy consistent with small cell carcinoma  Extrinsic appearing cecal mass confirmed on colonoscopy 04/19/2014 with a biopsy revealing small cell carcinoma  Whole brain and prostate radiation 04/24/2014 through  05/16/2014  Cycle 1 carboplatin/etoposide beginning 05/22/2014 with Neulasta support  Cycle 2 carboplatin/etoposide 06/19/2014  Cycle 3 carboplatin/etoposide 07/17/2014  Restaging CT scans 08/10/2014 with near-complete resolution of the large mass involving the prostate gland; resolution of inferior perirectal and right ileocolonic mesenteric lymph nodes; progressive right internal iliac/presacral adenopathy and new small bilateral adrenal nodules; persistent soft tissue nodule involving the medial wall of the cecum.  Cycle 4 carboplatin/etoposide 08/14/2014  MRI brain 09/18/2014 with interval collapse of the bilateral posterior frontal resection cavities. Minimal enhancement at the right frontal resection site has decreased. Increased enhancement along the margins of the left frontal resection cavity favored to be postoperative. Small amount of new non-mass-like enhancement more posteriorly in the paramedian right frontoparietal region likely postoperative/post ischemic. Decreased size of 7 mm falcine mass. No evidence of new intracranial metastases.  cycle 5 carboplatin/etoposide 09/24/2014  CTs 10/10/2014 revealed enlargement of bilateral adrenal nodules, no other evidence of disease progression 2. Left frontal meningioma, WHO grade 1, status post resection 04/06/2014 3. Left leg weakness secondary to #1 4. 1 cmbrain lesion at the inferior falx noted on the MRI 03/21/2014, not resected 5. Diabetes  6. Hypertension  7. Hyperlipidemia 8. Neutropenia following cycle 1 carboplatin/etoposide.   Disposition: Ivan Stark appears stable. There is no clinical evidence of disease progression. We will obtain restaging CT scans in approximately one month which will be at a three-month interval from the previous. He will return for a follow-up visit a few days after the scans to review the results. He will contact the office in the interim with any problems.  We made a referral to ENT for  evaluation of right-sided hearing loss.  Patient seen with Dr. Benay Spice.    Ned Card ANP/GNP-BC   11/20/2014  10:16 AM  This was a shared visit with Ned Card. Ivan Stark was interviewed and examined.  He will return for an office visit and restaging CT in approximately one month.  [name], M.D.

## 2014-11-20 NOTE — Telephone Encounter (Signed)
-----   Message from Wallace Cullens sent at 11/20/2014 12:05 PM EDT ----- Pt has called twice today, wants you to call him. [phone]

## 2014-11-20 NOTE — Telephone Encounter (Signed)
PT returned pt's call: pt did not answer, PT left message for pt to call back.

## 2014-11-20 NOTE — Telephone Encounter (Signed)
Gave and printed appt scheda dn avs fo rpt for June and July.Marland KitchenMarland KitchenMarland KitchenPt sched to see Dr. Warren Danes on 6.15.16 @ 1:20pm

## 2014-11-23 ENCOUNTER — Ambulatory Visit: Payer: Medicare Other

## 2014-11-23 DIAGNOSIS — R269 Unspecified abnormalities of gait and mobility: Secondary | ICD-10-CM

## 2014-11-23 DIAGNOSIS — G8194 Hemiplegia, unspecified affecting left nondominant side: Secondary | ICD-10-CM

## 2014-11-23 NOTE — Therapy (Signed)
[facility] [address] [place], [place], [address] Phone: [phone]   Fax:  [phone]  Physical Therapy Treatment  Patient Details  Name: Ivan Stark MRN: [id number] Date of Birth: [birth year] Referring Provider:  Unk Pinto, MD  Encounter Date: 11/23/2014      PT End of Session - 11/23/14 1052    Visit Number 19   Number of Visits 30   Date for PT Re-Evaluation 01/15/15   Authorization Type G-code every 10th visit.   PT Start Time 0932   PT Stop Time 1013   PT Time Calculation (min) 41 min   Equipment Utilized During Treatment Gait belt   Activity Tolerance Patient tolerated treatment well   Behavior During Therapy WFL for tasks assessed/performed      Past Medical History  Diagnosis Date  . Hypertension   . Hyperlipidemia   . GERD (gastroesophageal reflux disease)   . Vitamin D deficiency   . History of colon polyps   . Complication of anesthesia     Difficult to wake up; and severe nausea and vomiting  . PONV (postoperative nausea and vomiting)   . Diabetes mellitus without complication     metformin  . Cancer 03/2014    prostate ca    Past Surgical History  Procedure Laterality Date  . Tonsillectomy    . Colonoscopy w/ polypectomy    . Esophagus surgery      Had esophagus stretched due to food getting trapped in his throat  . Craniotomy Bilateral 04/06/2014    Procedure: Bilateral Fronto-parietal craniotomy for resection of tumors with Curve;  Surgeon: Consuella Lose, MD;  Location: [facility];  Service: Neurosurgery;  Laterality: Bilateral;  Bilateral Fronto-parietal craniotomy for resection of tumors with Curve  . Colonoscopy N/A 04/19/2014    Procedure: COLONOSCOPY;  Surgeon: Winfield Cunas., MD;  Location: [facility] ENDOSCOPY;  Service: Endoscopy;  Laterality: N/A;    There were no vitals filed for this visit.  Visit Diagnosis:  Abnormality of gait  Left hemiparesis      Subjective Assessment - 11/23/14 0936    Subjective Pt reported he went to ENT this week due to ears "stopping up" and MD wrote a prescription for pt. Pt reported no falls since last visit.    Pertinent History Active prostate cancer with brain metastasis   Patient Stated Goals Walk without RW   Currently in Pain? No/denies                         [facility] Adult PT Treatment/Exercise - 11/23/14 6384    Ambulation/Gait   Ambulation/Gait Yes   Ambulation/Gait Assistance 5: Supervision;4: Min guard;4: Min assist   Ambulation/Gait Assistance Details Cues to stay within RW, improve B heel strike, improve stride length. Pt required min A to maintain balance  during 3 LOB episodes (while turning with SPC). Performed head turns with RW and SPC. Pt required 3 seated rest breaks 2/2 fatigue.   Ambulation Distance (Feet) --  117'x4 with RW, 10'x7 with SPC, 300' RW outside, 50'x2 RW in   Assistive device Rolling walker;Straight cane   Gait Pattern Step-through pattern;Decreased dorsiflexion - left;Left steppage;Right flexed knee in stance;Left flexed knee in stance;Decreased trunk rotation;Decreased stride length;Decreased step length - right   Ambulation Surface Level;Unlevel;Indoor;Outdoor;Paved   Balance   Balance Assessed Yes   Dynamic Standing Balance   Dynamic Standing - Balance Support Bilateral upper extremity supported;Right upper extremity supported  Dynamic Standing - Level of Assistance Other (comment);5: Stand by assistance  min guard   Dynamic Standing - Balance Activities Forward lean/weight shifting   Dynamic Standing - Comments Performed at counter and in //bars 2x15/LE with 1-2 UE support: pt stepped over 2" beam to improve ant. wt. shifting. Cues to improve ant/post weight shifting, look straight ahead, and to extend ant. stance knee vs. knee flexion.                PT Education - 11/23/14 1051    Education provided Yes   Education Details PT reiterated  the importance of using RW during amb. for safety vs. rollator or SPC.   Person(s) Educated Patient   Methods Explanation   Comprehension Verbalized understanding          PT Short Term Goals - 08/08/14 0953    PT SHORT TERM GOAL #1   Title Pt will be independent in HEP to improve functional mobility. Target date: 08/08/14.   Status Partially Met   PT SHORT TERM GOAL #2   Title Asses BERG and write appropriate STG and LTG. Target date: 08/08/14.   Status Achieved   PT SHORT TERM GOAL #3   Title Pt will ambulated 300', with LRAD, over even/uneven terrain with supervision and no LOB to improve functional mobillity. Target date: 08/08/14.   Status Achieved   PT SHORT TERM GOAL #4   Title Pt will report zero falls in the last 4 weeks to improve safety during functional mobility. Target date: 08/08/14.   Baseline No falls in last 4 weeks.   Status Achieved   PT SHORT TERM GOAL #5   Title Pt will improve BERG balance score to >/=41/56 to decrease falls risk. Target date: 08/08/14.   Status Achieved           PT Long Term Goals - 11/23/14 1053    PT LONG TERM GOAL #1   Title Pt will verbalize understanding of falls prevention strategies to decrease falls risk. Target date: 09/05/14   Baseline Check LTGs after 30 days on 12/14/14 and continue unmet goals to new POC: 01/15/15.   Status Achieved   PT LONG TERM GOAL #2   Title Pt will ambulate 600', with LRAD, over even/uneven terrain at MOD I level in order to improve functional mobility at home and at work. Target date: 01/15/15.   Status On-going   PT LONG TERM GOAL #3   Title Have pt complete FOTO and write appropriate goal. Target date: 09/05/14.   Status Achieved   PT LONG TERM GOAL #4   Title Pt will perform sit<>stand x10 with no UE support to improve safety during functional mobility. Target date: 01/15/15.   Status On-going   PT LONG TERM GOAL #5   Title Pt will improve gait speed, with LRAD, to >/=2.18f/sec. to ambulate safely in the  community. Target date: 01/15/15.   Baseline --  1.66 ft/sec   Status On-going   PT LONG TERM GOAL #6   Title Pt will improve BERG balance score to >/=48/56 to decrease falls risk. Target date: 01/15/15.   Status Not Met   PT LONG TERM GOAL #7   Title Pt will improve ABC score by 10% to improve quality of life. Target date: 01/15/15.   Status On-going               Plan - 11/23/14 1052    Clinical Impression Statement Pt demonstrated progress, as he was able to ambulate  longer distances today (in and outdoors). Pt continues to require cues to improve B heel strike, R step length, and to stay within RW. Pt noted to experience improved heel strike during amb., after performing balance weight shifting in // bars. Pt would continue to benefit from skilled PT to improve safety during functional mobility.   Pt will benefit from skilled therapeutic intervention in order to improve on the following deficits Abnormal gait;Impaired flexibility;Decreased range of motion;Decreased endurance;Decreased knowledge of use of DME;Decreased balance;Decreased mobility;Decreased strength   Rehab Potential Good   PT Frequency 1x / week   PT Duration 8 weeks   PT Treatment/Interventions ADLs/Self Care Home Management;Gait training;Neuromuscular re-education;Biofeedback;Functional mobility training;Patient/family education;Therapeutic activities;Therapeutic exercise;Manual techniques;Balance training;DME Instruction   PT Next Visit Plan Dynamic gait/balance training with SPC and RW.    PT Home Exercise Plan Balance and strength HEP.    Consulted and Agree with Plan of Care Patient        Problem List Patient Active Problem List   Diagnosis Date Noted  . TIA (transient ischemic attack) 09/25/2014  . Left arm numbness 09/25/2014  . 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 Hypotension 05/22/2014  . Hypoalbuminemia 05/22/2014  . Small cell carcinoma of prostate   . Vision disturbance   .  Cerebral meningioma   . Left hemiparesis 04/09/2014  . Brain metastases 04/06/2014  . Poor compliance with F/U 02/04/2014  . 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 Medication management 08/20/2013  . Hypertension   . Hyperlipidemia   . GERD (gastroesophageal reflux disease)   . Vitamin D deficiency   . History of colon polyps     Zackery Brine L 11/23/2014, 10:56 AM  [facility] [address] [place], [place], [address] Phone: [phone]   Fax:  [phone]     Geoffry Paradise, PT,DPT 11/23/2014 10:56 AM Phone: [phone] Fax: [phone]

## 2014-11-27 ENCOUNTER — Ambulatory Visit: Payer: Medicare Other

## 2014-11-27 DIAGNOSIS — R269 Unspecified abnormalities of gait and mobility: Secondary | ICD-10-CM

## 2014-11-27 DIAGNOSIS — G8194 Hemiplegia, unspecified affecting left nondominant side: Secondary | ICD-10-CM

## 2014-11-27 NOTE — Therapy (Addendum)
[facility] [address] [place], [place], [address] Phone: [phone]   Fax:  [phone]  Physical Therapy Treatment  Patient Details  Name: Ivan Stark MRN: [id number] Date of Birth: [birth year] Referring Provider:  Unk Pinto, MD  Encounter Date: 11/27/2014  Physical Therapy Treatment  Patient Details  Name: Ivan Stark MRN: [id number] Date of Birth: 1945/10/02 Referring Provider: Unk Pinto, MD  Encounter Date: 11/27/2014      PT End of Session - 11/27/14 1011    Visit Number 20   Number of Visits 30   Date for PT Re-Evaluation 01/15/15   Authorization Type G-code every 10th visit.   PT Start Time [phone]   PT Stop Time 1006   PT Time Calculation (min) 44 min   Equipment Utilized During Treatment Gait belt   Activity Tolerance Patient tolerated treatment well   Behavior During Therapy WFL for tasks assessed/performed      Past Medical History  Diagnosis Date  . Hypertension   . Hyperlipidemia   . GERD (gastroesophageal reflux disease)   . Vitamin D deficiency   . History of colon polyps   . Complication of anesthesia     Difficult to wake up; and severe nausea and vomiting  . PONV (postoperative nausea and vomiting)   . Diabetes mellitus without complication     metformin  . Cancer 03/2014    prostate ca    Past Surgical History  Procedure Laterality Date  . Tonsillectomy    . Colonoscopy w/ polypectomy    . Esophagus surgery      Had esophagus stretched due to food getting trapped in his throat  . Craniotomy Bilateral 04/06/2014    Procedure: Bilateral Fronto-parietal craniotomy for resection of tumors with Curve; Surgeon: Consuella Lose, MD; Location: [facility]; Service: Neurosurgery; Laterality: Bilateral; Bilateral Fronto-parietal craniotomy for resection of  tumors with Curve  . Colonoscopy N/A 04/19/2014    Procedure: COLONOSCOPY; Surgeon: Winfield Cunas., MD; Location: [facility]; Service: Endoscopy; Laterality: N/A;    There were no vitals filed for this visit.  Visit Diagnosis: Abnormality of gait  Left hemiparesis      Subjective Assessment - 11/27/14 0927    Subjective Pt reported he started nasal spray last week and began to get headaches, so he ceased nasal spray and headaches ceased. Pt denied falls since last visit.   Pertinent History Active prostate cancer with brain metastasis   Patient Stated Goals Walk without RW   Currently in Pain? No/denies                         [facility] Adult PT Treatment/Exercise - 11/27/14 0930    Ambulation/Gait   Ambulation/Gait Yes   Ambulation/Gait Assistance 5: Supervision;4: Min guard;4: Min assist   Ambulation/Gait Assistance Details Cues for sequencing with SPC and to improve upright posture, R step length, and B heel strike. Min A during turns with SPC.   Ambulation Distance (Feet) --  50x2, 75'x2with RW, 50'x2 with SPC   Assistive device Rolling walker;Straight cane   Gait Pattern Step-through pattern;Decreased dorsiflexion - left;Left steppage;Right flexed knee in stance;Left flexed knee in stance;Decreased trunk rotation;Decreased stride length;Decreased step length - right   Ambulation Surface Level;Indoor   Gait velocity 1.60ft/sec.  with RW and 1.59ft/sec. with [facility]   Standardized Balance Assessment   Standardized Balance Assessment Berg Balance Test;Timed Up and Go Test   [employer] Test   Sit  to Stand Able to stand independently using hands   Standing Unsupported Able to stand safely 2 minutes   Sitting with Back Unsupported but Feet Supported on Floor or Stool Able to sit safely and securely 2 minutes   Stand to Sit Sits safely with minimal use of hands   Transfers Able to  transfer safely, definite need of hands   Standing Unsupported with Eyes Closed Able to stand 10 seconds with supervision   Standing Ubsupported with Feet Together Needs help to attain position but able to stand for 30 seconds with feet together   From Standing, Reach Forward with Outstretched Arm Can reach confidently >25 cm (10")   From Standing Position, Pick up Object from Floor Able to pick up shoe safely and easily   From Standing Position, Turn to Look Behind Over each Shoulder Looks behind one side only/other side shows less weight shift   Turn 360 Degrees Needs close supervision or verbal cueing   Standing Unsupported, Alternately Place Feet on Step/Stool Able to complete >2 steps/needs minimal assist   Standing Unsupported, One Foot in Front Able to plae foot ahead of the other independently and hold 30 seconds   Standing on One Leg Tries to lift leg/unable to hold 3 seconds but remains standing independently   Total Score 39   Timed Up and Go Test   TUG Normal TUG   Normal TUG (seconds) 26.05  with RW and 25.03sec. with [facility]                PT Education - 11/27/14 1010    Education provided Yes   Education Details PT encouraged pt to notify MD of headaches and that pt ceased nasal spray. PT reiterated the importance of performing HEP as prescribed to improve strength and balance. PT also encouraged pt to use RW at all times, based on BERG score.   Person(s) Educated Patient   Methods Explanation   Comprehension Verbalized understanding          PT Short Term Goals - 08/08/14 0953    PT SHORT TERM GOAL #1   Title Pt will be independent in HEP to improve functional mobility. Target date: 08/08/14.   Status Partially Met   PT SHORT TERM GOAL #2   Title Asses BERG and write appropriate STG and LTG. Target date: 08/08/14.   Status Achieved   PT SHORT TERM GOAL #3   Title Pt will  ambulated 300', with LRAD, over even/uneven terrain with supervision and no LOB to improve functional mobillity. Target date: 08/08/14.   Status Achieved   PT SHORT TERM GOAL #4   Title Pt will report zero falls in the last 4 weeks to improve safety during functional mobility. Target date: 08/08/14.   Baseline No falls in last 4 weeks.   Status Achieved   PT SHORT TERM GOAL #5   Title Pt will improve BERG balance score to >/=41/56 to decrease falls risk. Target date: 08/08/14.   Status Achieved           PT Long Term Goals - 11/23/14 1053    PT LONG TERM GOAL #1   Title Pt will verbalize understanding of falls prevention strategies to decrease falls risk. Target date: 09/05/14   Baseline Check LTGs after 30 days on 12/14/14 and continue unmet goals to new POC: 01/15/15.   Status Achieved   PT LONG TERM GOAL #2   Title Pt will ambulate 600', with LRAD, over even/uneven terrain at MOD I  level in order to improve functional mobility at home and at work. Target date: 01/15/15.   Status On-going   PT LONG TERM GOAL #3   Title Have pt complete FOTO and write appropriate goal. Target date: 09/05/14.   Status Achieved   PT LONG TERM GOAL #4   Title Pt will perform sit<>stand x10 with no UE support to improve safety during functional mobility. Target date: 01/15/15.   Status On-going   PT LONG TERM GOAL #5   Title Pt will improve gait speed, with LRAD, to >/=2.44ft/sec. to ambulate safely in the community. Target date: 01/15/15.   Baseline --  1.66 ft/sec   Status On-going   PT LONG TERM GOAL #6   Title Pt will improve BERG balance score to >/=48/56 to decrease falls risk. Target date: 01/15/15.   Status Not Met   PT LONG TERM GOAL #7   Title Pt will improve ABC score by 10% to improve quality of life. Target date: 01/15/15.   Status On-going               Plan - 11/30/2014 1011    Clinical Impression  Statement Pt's BERG score, TUG time and gait speed still indicate pt is at risk for falls. PT discussed at length, that pt need to perform HEP in order to improve balance and strength. This is especially true since pt decreased frequency to 1x/week due to co-pay amount. Continue with POC.   Pt will benefit from skilled therapeutic intervention in order to improve on the following deficits Abnormal gait;Impaired flexibility;Decreased range of motion;Decreased endurance;Decreased knowledge of use of DME;Decreased balance;Decreased mobility;Decreased strength   Rehab Potential Good   PT Frequency 1x / week   PT Duration 8 weeks   PT Treatment/Interventions ADLs/Self Care Home Management;Gait training;Neuromuscular re-education;Biofeedback;Functional mobility training;Patient/family education;Therapeutic activities;Therapeutic exercise;Manual techniques;Balance training;DME Instruction   PT Next Visit Plan Dynamic gait/balance training with SPC and RW.    PT Home Exercise Plan Balance and strength HEP.    Consulted and Agree with Plan of Care Patient          G-Codes - 2014-11-30 1012    Functional Assessment Tool Used Gait speed 1.47 ft/sec with RW and 1.22ft/sec. with SPC, TUG with RW: 26.05 and 25.03 with SPC; BERG 39/56   Functional Limitation Mobility: Walking and moving around   Mobility: Walking and Moving Around Current Status [phone]) At least 20 percent but less than 40 percent impaired, limited or restricted   Mobility: Walking and Moving Around Goal Status [phone]) At least 1 percent but less than 20 percent impaired, limited or restricted      Problem List Patient Active Problem List   Diagnosis Date Noted  . TIA (transient ischemic attack) 09/25/2014  . Left arm numbness 09/25/2014  . 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 Hypotension 05/22/2014  . Hypoalbuminemia 05/22/2014  . Small cell carcinoma  of prostate   . Vision disturbance   . Cerebral meningioma   . Left hemiparesis 04/09/2014  . Brain metastases 04/06/2014  . Poor compliance with F/U 02/04/2014  . 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 Medication management 08/20/2013  . Hypertension   . Hyperlipidemia   . GERD (gastroesophageal reflux disease)   . Vitamin D deficiency   . History of colon polyps     Tomislav Micale L November 30, 2014, 10:13 AM  [facility] [address] [place], [place], [address] Phone: [phone] Fax: [phone]     Geoffry Paradise,  PT,DPT 11/27/2014 10:13 AM Phone: [phone] Fax: [phone]       Physical Therapy Progress Note  Dates of Reporting Period: 07/11/14 to 11/27/14  Objective Reports of Subjective Statement: Pt missed multiple sessions due to vacation and medical treatment for cancer. Therefore, pt had progressed to using [facility] for gait but now it is recommended he use RW for safety and is now. However, pt has demonstrated good sequencing and safety when using SPC over even terrain.  Objective Measurements: see above note   Goal Update: see above note  Plan: see above note.  Reason Skilled Services are Required: To improve safety during functional mobility, improve strength and balance.  Geoffry Paradise, PT,DPT 12/07/2014 10:03 AM Phone: [phone] Fax: [phone]

## 2014-11-28 ENCOUNTER — Ambulatory Visit: Payer: Medicare Other

## 2014-12-03 ENCOUNTER — Other Ambulatory Visit: Payer: Self-pay

## 2014-12-05 ENCOUNTER — Ambulatory Visit: Payer: Medicare Other

## 2014-12-05 DIAGNOSIS — R269 Unspecified abnormalities of gait and mobility: Secondary | ICD-10-CM | POA: Diagnosis not present

## 2014-12-05 DIAGNOSIS — G8194 Hemiplegia, unspecified affecting left nondominant side: Secondary | ICD-10-CM

## 2014-12-05 NOTE — Therapy (Signed)
[facility] [address] [place], [place], [address] Phone: [phone]   Fax:  [phone]  Physical Therapy Treatment  Patient Details  Name: Ivan Stark MRN: [id number] Date of Birth: 24-Aug-1945 Referring Provider:  Unk Pinto, MD  Encounter Date: 12/05/2014      PT End of Session - 12/05/14 1015    Visit Number 21   Number of Visits 30   Date for PT Re-Evaluation 01/15/15   Authorization Type G-code every 10th visit.   PT Start Time [phone]   PT Stop Time 1012   PT Time Calculation (min) 39 min   Equipment Utilized During Treatment Gait belt   Activity Tolerance Patient tolerated treatment well   Behavior During Therapy WFL for tasks assessed/performed      Past Medical History  Diagnosis Date  . Hypertension   . Hyperlipidemia   . GERD (gastroesophageal reflux disease)   . Vitamin D deficiency   . History of colon polyps   . Complication of anesthesia     Difficult to wake up; and severe nausea and vomiting  . PONV (postoperative nausea and vomiting)   . Diabetes mellitus without complication     metformin  . Cancer 03/2014    prostate ca    Past Surgical History  Procedure Laterality Date  . Tonsillectomy    . Colonoscopy w/ polypectomy    . Esophagus surgery      Had esophagus stretched due to food getting trapped in his throat  . Craniotomy Bilateral 04/06/2014    Procedure: Bilateral Fronto-parietal craniotomy for resection of tumors with Curve;  Surgeon: Consuella Lose, MD;  Location: [facility];  Service: Neurosurgery;  Laterality: Bilateral;  Bilateral Fronto-parietal craniotomy for resection of tumors with Curve  . Colonoscopy N/A 04/19/2014    Procedure: COLONOSCOPY;  Surgeon: Winfield Cunas., MD;  Location: [facility] ENDOSCOPY;  Service: Endoscopy;  Laterality: N/A;    There were no vitals filed for this visit.  Visit Diagnosis:  Abnormality of gait  Left hemiparesis      Subjective Assessment - 12/05/14 0938    Subjective Pt reported he had a near fall yesterday when at the park (it was hot outside) and sitting in a big swing, his legs gave way when he went to get up. Pt was able to control the fall and sit on the ground, his wife and another couple assisted pt in getting.  Pt denied hitting head or any other injuries.    Pertinent History Active prostate cancer with brain metastasis   Patient Stated Goals Walk without RW   Currently in Pain? No/denies                         [facility] Adult PT Treatment/Exercise - 12/05/14 0943    Ambulation/Gait   Ambulation/Gait Yes   Ambulation/Gait Assistance 5: Supervision;4: Min guard;4: Min assist   Ambulation/Gait Assistance Details VC's to stay within RW and to improve stride length, avoid obstacles, and B heel strike. Pt required one rest break after amb. with RW 2/2 fatigue. Pt required two rest breaks during amb. with SPC due to fatigue and L knee buckling.   Ambulation Distance (Feet) --  59' and 400' with RW, 117' and 100' with [facility]   Assistive device Rolling walker;Straight cane   Gait Pattern Step-through pattern;Decreased dorsiflexion - left;Left steppage;Right flexed knee in stance;Left flexed knee in stance;Decreased trunk rotation;Decreased stride length;Decreased step length - right  Ambulation Surface Level;Indoor   Gait velocity --                PT Education - 12/05/14 1013    Education provided Yes   Education Details PT again reiterated the importance of performing HEP as prescribed. Pt also educated pt on staying hydrated and drinking plenty of water, especially when outdoors during the summer months. PT discouraged pt from drinking too many diet lemonades and reiterated the importance of consuing water with electrolytes. PT also reiterated the importance of always using RW for safety and to reduce falls risk, as pt reported he sometimes does not use any AD.   Person(s)  Educated Patient   Methods Explanation   Comprehension Verbalized understanding          PT Short Term Goals - 08/08/14 0953    PT SHORT TERM GOAL #1   Title Pt will be independent in HEP to improve functional mobility. Target date: 08/08/14.   Status Partially Met   PT SHORT TERM GOAL #2   Title Asses BERG and write appropriate STG and LTG. Target date: 08/08/14.   Status Achieved   PT SHORT TERM GOAL #3   Title Pt will ambulated 300', with LRAD, over even/uneven terrain with supervision and no LOB to improve functional mobillity. Target date: 08/08/14.   Status Achieved   PT SHORT TERM GOAL #4   Title Pt will report zero falls in the last 4 weeks to improve safety during functional mobility. Target date: 08/08/14.   Baseline No falls in last 4 weeks.   Status Achieved   PT SHORT TERM GOAL #5   Title Pt will improve BERG balance score to >/=41/56 to decrease falls risk. Target date: 08/08/14.   Status Achieved           PT Long Term Goals - 11/23/14 1053    PT LONG TERM GOAL #1   Title Pt will verbalize understanding of falls prevention strategies to decrease falls risk. Target date: 09/05/14   Baseline Check LTGs after 30 days on 12/14/14 and continue unmet goals to new POC: 01/15/15.   Status Achieved   PT LONG TERM GOAL #2   Title Pt will ambulate 600', with LRAD, over even/uneven terrain at MOD I level in order to improve functional mobility at home and at work. Target date: 01/15/15.   Status On-going   PT LONG TERM GOAL #3   Title Have pt complete FOTO and write appropriate goal. Target date: 09/05/14.   Status Achieved   PT LONG TERM GOAL #4   Title Pt will perform sit<>stand x10 with no UE support to improve safety during functional mobility. Target date: 01/15/15.   Status On-going   PT LONG TERM GOAL #5   Title Pt will improve gait speed, with LRAD, to >/=2.54f/sec. to ambulate safely in the community. Target date: 01/15/15.   Baseline --  1.66 ft/sec   Status On-going   PT  LONG TERM GOAL #6   Title Pt will improve BERG balance score to >/=48/56 to decrease falls risk. Target date: 01/15/15.   Status Not Met   PT LONG TERM GOAL #7   Title Pt will improve ABC score by 10% to improve quality of life. Target date: 01/15/15.   Status On-going               Plan - 12/05/14 1015    Clinical Impression Statement Pt continues to demonstrate decreased R step lenght, decr. lateral weight shifitng  onto L LE, and decreased B heel strike due to LE weakness and fatigue. Pt demonstrated improved heel strike when ambulating with SPC, as he could not rely on B UE support as he does when using RW. Continue with POC.   Pt will benefit from skilled therapeutic intervention in order to improve on the following deficits Abnormal gait;Impaired flexibility;Decreased range of motion;Decreased endurance;Decreased knowledge of use of DME;Decreased balance;Decreased mobility;Decreased strength   Rehab Potential Good   PT Frequency 1x / week   PT Duration 8 weeks   PT Treatment/Interventions ADLs/Self Care Home Management;Gait training;Neuromuscular re-education;Biofeedback;Functional mobility training;Patient/family education;Therapeutic activities;Therapeutic exercise;Manual techniques;Balance training;DME Instruction   PT Next Visit Plan Dynamic gait/balance training with SPC and RW.    PT Home Exercise Plan Balance and strength HEP.    Consulted and Agree with Plan of Care Patient        Problem List Patient Active Problem List   Diagnosis Date Noted  . TIA (transient ischemic attack) 09/25/2014  . Left arm numbness 09/25/2014  . 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 Hypotension 05/22/2014  . Hypoalbuminemia 05/22/2014  . Small cell carcinoma of prostate   . Vision disturbance   . Cerebral meningioma   . Left hemiparesis 04/09/2014  . Brain metastases 04/06/2014  . Poor compliance with F/U 02/04/2014  . 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 Medication  management 08/20/2013  . Hypertension   . Hyperlipidemia   . GERD (gastroesophageal reflux disease)   . Vitamin D deficiency   . History of colon polyps     Miller,Jennifer L 12/05/2014, 10:17 AM  [facility] [address] [place], [place], [address] Phone: [phone]   Fax:  [phone]     Geoffry Paradise, PT,DPT 12/05/2014 10:17 AM Phone: [phone] Fax: [phone]

## 2014-12-06 ENCOUNTER — Other Ambulatory Visit: Payer: Self-pay | Admitting: Internal Medicine

## 2014-12-08 ENCOUNTER — Other Ambulatory Visit: Payer: Self-pay | Admitting: Internal Medicine

## 2014-12-11 ENCOUNTER — Other Ambulatory Visit: Payer: Self-pay | Admitting: Internal Medicine

## 2014-12-13 ENCOUNTER — Ambulatory Visit: Payer: Medicare Other | Attending: Physical Medicine & Rehabilitation

## 2014-12-13 ENCOUNTER — Telehealth: Payer: Self-pay

## 2014-12-13 DIAGNOSIS — R269 Unspecified abnormalities of gait and mobility: Secondary | ICD-10-CM | POA: Insufficient documentation

## 2014-12-13 NOTE — Telephone Encounter (Signed)
Called pt regarding no-show to 12/13/14 PT appt. Pt reported he forgot. PT asked pt to come tomorrow 7/8 at 1:15pm, and pt will call us back to let us know if he can make appt. Tomorrow.

## 2014-12-18 ENCOUNTER — Ambulatory Visit: Payer: Self-pay | Admitting: Internal Medicine

## 2014-12-27 ENCOUNTER — Other Ambulatory Visit (HOSPITAL_BASED_OUTPATIENT_CLINIC_OR_DEPARTMENT_OTHER): Payer: Medicare Other

## 2014-12-27 ENCOUNTER — Ambulatory Visit (HOSPITAL_COMMUNITY)
Admission: RE | Admit: 2014-12-27 | Discharge: 2014-12-27 | Disposition: A | Discharge status: Home / Self Care | Payer: Medicare Other | Source: Ambulatory Visit | Attending: Nurse Practitioner | Admitting: Nurse Practitioner

## 2014-12-27 ENCOUNTER — Encounter (HOSPITAL_COMMUNITY): Payer: Self-pay

## 2014-12-27 ENCOUNTER — Ambulatory Visit: Payer: Medicare Other

## 2014-12-27 DIAGNOSIS — C61 Malignant neoplasm of prostate: Secondary | ICD-10-CM

## 2014-12-27 DIAGNOSIS — C7951 Secondary malignant neoplasm of bone: Secondary | ICD-10-CM

## 2014-12-27 DIAGNOSIS — I2510 Atherosclerotic heart disease of native coronary artery without angina pectoris: Secondary | ICD-10-CM | POA: Insufficient documentation

## 2014-12-27 DIAGNOSIS — C7931 Secondary malignant neoplasm of brain: Secondary | ICD-10-CM | POA: Diagnosis not present

## 2014-12-27 DIAGNOSIS — Z8546 Personal history of malignant neoplasm of prostate: Secondary | ICD-10-CM | POA: Diagnosis not present

## 2014-12-27 DIAGNOSIS — R911 Solitary pulmonary nodule: Secondary | ICD-10-CM | POA: Insufficient documentation

## 2014-12-27 LAB — CBC WITH DIFFERENTIAL/PLATELET
BASO%: 0.4 % (ref 0.0–2.0)
Basophils Absolute: 0 10*3/uL (ref 0.0–0.1)
EOS ABS: 0 10*3/uL (ref 0.0–0.5)
EOS%: 0.8 % (ref 0.0–7.0)
HCT: 43 % (ref 38.4–49.9)
HGB: 14.2 g/dL (ref 13.0–17.1)
LYMPH%: 24 % (ref 14.0–49.0)
MCH: 30 pg (ref 27.2–33.4)
MCHC: 33 g/dL (ref 32.0–36.0)
MCV: 90.9 fL (ref 79.3–98.0)
MONO#: 0.5 10*3/uL (ref 0.1–0.9)
MONO%: 11.1 % (ref 0.0–14.0)
NEUT#: 3.1 10*3/uL (ref 1.5–6.5)
NEUT%: 63.7 % (ref 39.0–75.0)
Platelets: 220 10*3/uL (ref 140–400)
RBC: 4.73 10*6/uL (ref 4.20–5.82)
RDW: 15.5 % — ABNORMAL HIGH (ref 11.0–14.6)
WBC: 4.9 10*3/uL (ref 4.0–10.3)
lymph#: 1.2 10*3/uL (ref 0.9–3.3)

## 2014-12-27 LAB — COMPREHENSIVE METABOLIC PANEL (CC13)
ALBUMIN: 3.5 g/dL (ref 3.5–5.0)
ALT: 48 U/L (ref 0–55)
AST: 30 U/L (ref 5–34)
Alkaline Phosphatase: 85 U/L (ref 40–150)
Anion Gap: 7 mEq/L (ref 3–11)
BILIRUBIN TOTAL: 0.56 mg/dL (ref 0.20–1.20)
BUN: 12.6 mg/dL (ref 7.0–26.0)
CO2: 30 meq/L — AB (ref 22–29)
Calcium: 9.5 mg/dL (ref 8.4–10.4)
Chloride: 106 mEq/L (ref 98–109)
Creatinine: 0.9 mg/dL (ref 0.7–1.3)
EGFR: 90 mL/min/{1.73_m2} — AB (ref >90)
Glucose: 101 mg/dl (ref 70–140)
Potassium: 3.9 mEq/L (ref 3.5–5.1)
SODIUM: 144 meq/L (ref 136–145)
TOTAL PROTEIN: 6.5 g/dL (ref 6.4–8.3)

## 2014-12-27 MED ORDER — IOHEXOL 300 MG/ML  SOLN
100.0000 mL | Freq: Once | INTRAMUSCULAR | Status: AC | PRN
  Administered 2014-12-27: 100 mL via INTRAVENOUS

## 2014-12-31 ENCOUNTER — Telehealth: Payer: Self-pay | Admitting: Oncology

## 2014-12-31 ENCOUNTER — Other Ambulatory Visit: Payer: Self-pay | Admitting: *Deleted

## 2014-12-31 ENCOUNTER — Ambulatory Visit (HOSPITAL_BASED_OUTPATIENT_CLINIC_OR_DEPARTMENT_OTHER): Payer: Medicare Other | Admitting: Oncology

## 2014-12-31 VITALS — BP 132/81 | HR 84 | Temp 98.0°F | Resp 18 | Ht 75.0 in | Wt 178.2 lb

## 2014-12-31 DIAGNOSIS — C7931 Secondary malignant neoplasm of brain: Secondary | ICD-10-CM

## 2014-12-31 DIAGNOSIS — R531 Weakness: Secondary | ICD-10-CM | POA: Diagnosis not present

## 2014-12-31 DIAGNOSIS — C61 Malignant neoplasm of prostate: Secondary | ICD-10-CM | POA: Diagnosis not present

## 2014-12-31 MED ORDER — TEMOZOLOMIDE 140 MG PO CAPS: ORAL_CAPSULE | ORAL | Status: DC

## 2014-12-31 MED ORDER — ONDANSETRON HCL 8 MG PO TABS: 8.0000 mg | ORAL_TABLET | Freq: Every day | ORAL | Status: DC

## 2014-12-31 NOTE — Progress Notes (Signed)
[facility] OFFICE PROGRESS NOTE [name]  Diagnosis: Small cell carcinoma of the prostate  INTERVAL HISTORY:   [name] returns as scheduled. He has difficulty getting to the bathroom quick enough to urinate. He is now wearing depends. He is able to ambulate with a walker but has a problem with his balance and leg weakness. He is ambulatory and home and is able to get out of the house.  Objective:  Vital signs in last 24 hours:  Blood pressure 132/81, pulse 84, temperature 98 F (36.7 C), temperature source Oral, resp. rate 18, height 6\' 3"  (1.905 m), weight 178 lb 3.2 oz (80.831 kg), SpO2 98 %.   Resp: Lungs clear bilaterally Cardio: Regular rate and rhythm GI: No hepatomegaly, nontender, no mass Vascular: No leg edema Neuro: Alert and oriented, the motor exam appears intact in the upper and lower extremities bilaterally. Slight weakness of the left arm and leg compared to the right side. He is able to ambulate with a walker     Lab Results:  Lab Results  Component Value Date   WBC 4.9 12/27/2014   HGB 14.2 12/27/2014   HCT 43.0 12/27/2014   MCV 90.9 12/27/2014   PLT 220 12/27/2014   NEUTROABS 3.1 12/27/2014     Imaging:  Ct Chest W Contrast  12/27/2014   CLINICAL DATA:  Subsequent evaluation of a 69 year old male with history of prostate cancer with metastatic disease to the brain undergoing ongoing chemotherapy. Restaging examination.  EXAM: CT CHEST, ABDOMEN, AND PELVIS WITH CONTRAST  TECHNIQUE: Multidetector CT imaging of the chest, abdomen and pelvis was performed following the standard protocol during bolus administration of intravenous contrast.  CONTRAST:  148mL OMNIPAQUE IOHEXOL 300 MG/ML  SOLN  COMPARISON:  CT of the chest, abdomen and pelvis 10/10/2014.  FINDINGS: CT CHEST FINDINGS  Mediastinum/Lymph Nodes: Heart size is normal. Small amount of anterior pericardial fluid and/or thickening, unlikely to be of any hemodynamic significance at  this time. No associated pericardial calcification. There is atherosclerosis of the thoracic aorta, the great vessels of the mediastinum and the coronary arteries, including calcified atherosclerotic plaque in the left anterior descending, left circumflex and right coronary arteries. No pathologically enlarged mediastinal or hilar lymph nodes. Small hiatal hernia with small amount of herniated fat and fluid, similar to prior examination. No axillary lymphadenopathy. Thyroid gland is again markedly heterogeneous in appearance with multiple large nodules, largest of which is in the left lobe of the gland (partially obscured by beam hardening artifact on today's study) measuring approximately 2.0 cm in diameter (unchanged when measured in a similar fashion on prior study 10/10/2014).  Lungs/Pleura: 4 mm left upper lobe nodule (image 11 of series 4) is unchanged. Small areas of peripheral bronchiolectasis in the lateral aspect of the right lower lobe and lateral segment of the right middle lobe are both unchanged, most compatible with areas of mild post infectious or inflammatory scarring. No acute consolidative airspace disease. No pleural effusions. A few scattered subpleural nodules are similar to the prior examination. In addition, there is a new 5 mm subpleural nodule in the posterior aspect of the left lower lobe (image 29 of series 2 and 4).  Musculoskeletal/Soft Tissues: There are no aggressive appearing lytic or blastic lesions noted in the visualized portions of the skeleton.  CT ABDOMEN AND PELVIS FINDINGS  Hepatobiliary: No suspicious appearing cystic or solid hepatic lesions are identified. No intra or extrahepatic biliary ductal dilatation. Gallbladder is normal in appearance.  Pancreas: No  pancreatic mass. No pancreatic ductal dilatation. No pancreatic or peripancreatic fluid or inflammatory changes.  Spleen: Unremarkable.  Adrenals/Urinary Tract: Enlargement of previously noted bilateral adrenal lesions,  currently with a 3.9 x 2.2 cm right adrenal mass, and a 2.4 x 2.1 cm left adrenal nodule. Bilateral kidneys are normal in appearance. No hydroureteronephrosis. Urinary bladder is normal in appearance.  Stomach/Bowel: Normal appearance of the stomach. No pathologic dilatation of small bowel or colon. A few scattered colonic diverticulae are noted, without surrounding inflammatory changes to suggest an acute diverticulitis at this time. Normal appendix.  Vascular/Lymphatic: Atherosclerosis throughout the abdominal and pelvic vasculature, without evidence of aneurysm or dissection. New 1.2 x 1.7 cm soft tissue attenuation lesion in the left upper quadrant of the abdomen may represent an enlarged lymph node in the transverse mesocolon, or could simply represent a peritoneal implants. Recurrent enlarged right internal iliac lymph node which currently measures 1.4 cm in short axis (image 111 of series 2).  Reproductive: Prostate gland and seminal vesicles are unremarkable in appearance.  Other: No significant volume of ascites.  No pneumoperitoneum.  Musculoskeletal: 19 mm soft tissue nodule in the subcutaneous fat of the left lumbar region (image 83 of series 2) is unchanged, likely a sebaceous cyst. Small sclerotic lesion in the left ilium immediately lateral to the sacroiliac joint (image 110 of series 2) is unchanged. There are no other new aggressive appearing lytic or blastic lesions noted in the visualized portions of the skeleton.  IMPRESSION: 1. Today's study demonstrates progression of metastatic disease, with recurrent right internal iliac lymph node, and enlarging bilateral adrenal nodules/masses, as above. 2. New 5 mm subpleural nodule in the left lower lobe (image 29 of series 2 and 4) is nonspecific and may simply represent a subpleural lymph node. Attention on future follow-up imaging is recommended to ensure the stability or resolution of this finding. 3. Atherosclerosis, including 3 vessel coronary  artery disease. Please note that although the presence of coronary artery calcium documents the presence of coronary artery disease, the severity of this disease and any potential stenosis cannot be assessed on this non-gated CT examination. Assessment for potential risk factor modification, dietary therapy or pharmacologic therapy may be warranted, if clinically indicated. 4. Additional incidental findings, as above, similar prior examinations.   Electronically Signed   By: Vinnie Langton M.D.   On: 12/27/2014 14:19   Ct Abdomen Pelvis W Contrast  12/27/2014   CLINICAL DATA:  Subsequent evaluation of a 69 year old male with history of prostate cancer with metastatic disease to the brain undergoing ongoing chemotherapy. Restaging examination.  EXAM: CT CHEST, ABDOMEN, AND PELVIS WITH CONTRAST  TECHNIQUE: Multidetector CT imaging of the chest, abdomen and pelvis was performed following the standard protocol during bolus administration of intravenous contrast.  CONTRAST:  151mL OMNIPAQUE IOHEXOL 300 MG/ML  SOLN  COMPARISON:  CT of the chest, abdomen and pelvis 10/10/2014.  FINDINGS: CT CHEST FINDINGS  Mediastinum/Lymph Nodes: Heart size is normal. Small amount of anterior pericardial fluid and/or thickening, unlikely to be of any hemodynamic significance at this time. No associated pericardial calcification. There is atherosclerosis of the thoracic aorta, the great vessels of the mediastinum and the coronary arteries, including calcified atherosclerotic plaque in the left anterior descending, left circumflex and right coronary arteries. No pathologically enlarged mediastinal or hilar lymph nodes. Small hiatal hernia with small amount of herniated fat and fluid, similar to prior examination. No axillary lymphadenopathy. Thyroid gland is again markedly heterogeneous in appearance with multiple large nodules, largest  of which is in the left lobe of the gland (partially obscured by beam hardening artifact on today's  study) measuring approximately 2.0 cm in diameter (unchanged when measured in a similar fashion on prior study 10/10/2014).  Lungs/Pleura: 4 mm left upper lobe nodule (image 11 of series 4) is unchanged. Small areas of peripheral bronchiolectasis in the lateral aspect of the right lower lobe and lateral segment of the right middle lobe are both unchanged, most compatible with areas of mild post infectious or inflammatory scarring. No acute consolidative airspace disease. No pleural effusions. A few scattered subpleural nodules are similar to the prior examination. In addition, there is a new 5 mm subpleural nodule in the posterior aspect of the left lower lobe (image 29 of series 2 and 4).  Musculoskeletal/Soft Tissues: There are no aggressive appearing lytic or blastic lesions noted in the visualized portions of the skeleton.  CT ABDOMEN AND PELVIS FINDINGS  Hepatobiliary: No suspicious appearing cystic or solid hepatic lesions are identified. No intra or extrahepatic biliary ductal dilatation. Gallbladder is normal in appearance.  Pancreas: No pancreatic mass. No pancreatic ductal dilatation. No pancreatic or peripancreatic fluid or inflammatory changes.  Spleen: Unremarkable.  Adrenals/Urinary Tract: Enlargement of previously noted bilateral adrenal lesions, currently with a 3.9 x 2.2 cm right adrenal mass, and a 2.4 x 2.1 cm left adrenal nodule. Bilateral kidneys are normal in appearance. No hydroureteronephrosis. Urinary bladder is normal in appearance.  Stomach/Bowel: Normal appearance of the stomach. No pathologic dilatation of small bowel or colon. A few scattered colonic diverticulae are noted, without surrounding inflammatory changes to suggest an acute diverticulitis at this time. Normal appendix.  Vascular/Lymphatic: Atherosclerosis throughout the abdominal and pelvic vasculature, without evidence of aneurysm or dissection. New 1.2 x 1.7 cm soft tissue attenuation lesion in the left upper quadrant of the  abdomen may represent an enlarged lymph node in the transverse mesocolon, or could simply represent a peritoneal implants. Recurrent enlarged right internal iliac lymph node which currently measures 1.4 cm in short axis (image 111 of series 2).  Reproductive: Prostate gland and seminal vesicles are unremarkable in appearance.  Other: No significant volume of ascites.  No pneumoperitoneum.  Musculoskeletal: 19 mm soft tissue nodule in the subcutaneous fat of the left lumbar region (image 83 of series 2) is unchanged, likely a sebaceous cyst. Small sclerotic lesion in the left ilium immediately lateral to the sacroiliac joint (image 110 of series 2) is unchanged. There are no other new aggressive appearing lytic or blastic lesions noted in the visualized portions of the skeleton.  IMPRESSION: 1. Today's study demonstrates progression of metastatic disease, with recurrent right internal iliac lymph node, and enlarging bilateral adrenal nodules/masses, as above. 2. New 5 mm subpleural nodule in the left lower lobe (image 29 of series 2 and 4) is nonspecific and may simply represent a subpleural lymph node. Attention on future follow-up imaging is recommended to ensure the stability or resolution of this finding. 3. Atherosclerosis, including 3 vessel coronary artery disease. Please note that although the presence of coronary artery calcium documents the presence of coronary artery disease, the severity of this disease and any potential stenosis cannot be assessed on this non-gated CT examination. Assessment for potential risk factor modification, dietary therapy or pharmacologic therapy may be warranted, if clinically indicated. 4. Additional incidental findings, as above, similar prior examinations.   Electronically Signed   By: Vinnie Langton M.D.   On: 12/27/2014 14:19    Medications: I have reviewed the patient's  current medications.  Assessment/Plan: 1. 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  Staging CTs of the chest, abdomen, and pelvis 04/12/2014 confirmed a prostate mass  Prostate ultrasound 04/16/2014 confirmed a prostate mass with a biopsy consistent with small cell carcinoma  Extrinsic appearing cecal mass confirmed on colonoscopy 04/19/2014 with a biopsy revealing small cell carcinoma  Whole brain and prostate radiation 04/24/2014 through 05/16/2014  Cycle 1 carboplatin/etoposide beginning 05/22/2014 with Neulasta support  Cycle 2 carboplatin/etoposide 06/19/2014  Cycle 3 carboplatin/etoposide 07/17/2014  Restaging CT scans 08/10/2014 with near-complete resolution of the large mass involving the prostate gland; resolution of inferior perirectal and right ileocolonic mesenteric lymph nodes; progressive right internal iliac/presacral adenopathy and new small bilateral adrenal nodules; persistent soft tissue nodule involving the medial wall of the cecum.  Cycle 4 carboplatin/etoposide 08/14/2014  MRI brain 09/18/2014 with interval collapse of the bilateral posterior frontal resection cavities. Minimal enhancement at the right frontal resection site has decreased. Increased enhancement along the margins of the left frontal resection cavity favored to be postoperative. Small amount of new non-mass-like enhancement more posteriorly in the paramedian right frontoparietal region likely postoperative/post ischemic. Decreased size of 7 mm falcine mass. No evidence of new intracranial metastases.  cycle 5 carboplatin/etoposide 09/24/2014  CTs 10/10/2014 revealed enlargement of bilateral adrenal nodules, no other evidence of disease progression  CT 12/27/2014 with enlargement of adrenal metastases, a new peritoneal implant, and a recurrent iliac node 2. Left frontal meningioma, WHO grade 1, status post resection 04/06/2014 3. Left leg weakness secondary to #1 4. 1 cmbrain lesion at the inferior falx noted on the MRI  03/21/2014, not resected 5. Diabetes  6. Hypertension  7. Hyperlipidemia 8. Neutropenia following cycle 1 carboplatin/etoposide.    Disposition:  Ivan Stark has CT scan evidence for progression of the metastatic small cell carcinoma. I suspect the left-sided weakness and difficulty with ambulation are related to the brain surgery and radiation. I have a low clinical suspicion for CNS progression.  I discussed salvage treatment options with Ivan Stark and his wife. He does not appear to be a candidate for irinotecan/cisplatin. We decided to try temozolomide. I reviewed the potential toxicities associated with temozolomide including the chance for nausea, diarrhea, and hematologic toxicity. He agrees to proceed. He will begin a first cycle of temozolomide on 01/07/2015. Ivan Stark will return for an office visit and nadir CBC on 01/21/2015.  Betsy Coder, MD  12/31/2014  1:03 PM

## 2014-12-31 NOTE — Telephone Encounter (Signed)
Pt confirmed labs/ov per 07/25 POF, gave pt AVS and Calendar.... KJ °

## 2015-01-01 ENCOUNTER — Other Ambulatory Visit: Payer: Self-pay | Admitting: *Deleted

## 2015-01-01 ENCOUNTER — Telehealth: Payer: Self-pay | Admitting: *Deleted

## 2015-01-01 MED ORDER — METFORMIN HCL ER 500 MG PO TB24: ORAL_TABLET | ORAL | Status: DC

## 2015-01-01 NOTE — Telephone Encounter (Signed)
Patient called stating that he will be starting on a new drug (temozolomide). Patient states that his insurance plan will not cover this medication and would like to know an alternative. Message sent to financial advisor Raquel/ MD Sherrill/RN Amy H.

## 2015-01-01 NOTE — Telephone Encounter (Signed)
[facility]  faxed Prior authorization request for Ondansetron.  Request to Managed Care for review.

## 2015-01-01 NOTE — Telephone Encounter (Signed)
Let me know if we need to change therapy No good alternative at present

## 2015-01-02 ENCOUNTER — Encounter: Payer: Self-pay | Admitting: Oncology

## 2015-01-02 ENCOUNTER — Other Ambulatory Visit: Payer: Self-pay | Admitting: *Deleted

## 2015-01-02 MED ORDER — GLIMEPIRIDE 4 MG PO TABS: ORAL_TABLET | ORAL | Status: DC

## 2015-01-02 NOTE — Progress Notes (Signed)
I faxed prio auth request for zofran to [facility]

## 2015-01-02 NOTE — Progress Notes (Signed)
I sent req for zofran

## 2015-01-02 NOTE — Telephone Encounter (Signed)
Pt called again stating that the new medication prescribed by Dr Benay Spice is not covered by medicare. What other choices may be covered. Is there any financial assistance? Explained to pt that the message from yesterday was sent to managed care and Dr Benay Spice is also aware. If he does not hear from [place] by Friday to give [place] another call.

## 2015-01-03 ENCOUNTER — Encounter: Payer: Self-pay | Admitting: Oncology

## 2015-01-03 ENCOUNTER — Telehealth: Payer: Self-pay | Admitting: *Deleted

## 2015-01-03 ENCOUNTER — Ambulatory Visit: Payer: Medicare Other

## 2015-01-03 DIAGNOSIS — R269 Unspecified abnormalities of gait and mobility: Secondary | ICD-10-CM | POA: Diagnosis present

## 2015-01-03 MED ORDER — TEMOZOLOMIDE 140 MG PO CAPS: ORAL_CAPSULE | ORAL | Status: DC

## 2015-01-03 NOTE — Telephone Encounter (Signed)
Resent Script to [employer]

## 2015-01-03 NOTE — Therapy (Signed)
[facility] [address] [place], [place], [address] Phone: [phone]   Fax:  [phone]  Physical Therapy Treatment  Patient Details  Name: Ivan Stark MRN: [id number] Date of Birth: December 28, 1945 Referring Provider:  Unk Pinto, MD  Encounter Date: 01/03/2015      PT End of Session - 01/03/15 1211    Visit Number 22   Number of Visits 30   Date for PT Re-Evaluation 01/15/15   Authorization Type G-code every 10th visit.   PT Start Time 1108  pt in bathroom   PT Stop Time 1146   PT Time Calculation (min) 38 min   Equipment Utilized During Treatment Gait belt   Activity Tolerance Patient tolerated treatment well   Behavior During Therapy WFL for tasks assessed/performed      Past Medical History  Diagnosis Date  . Hypertension   . Hyperlipidemia   . GERD (gastroesophageal reflux disease)   . Vitamin D deficiency   . History of colon polyps   . Complication of anesthesia     Difficult to wake up; and severe nausea and vomiting  . PONV (postoperative nausea and vomiting)   . Diabetes mellitus without complication     metformin  . Cancer 03/2014    prostate ca    Past Surgical History  Procedure Laterality Date  . Tonsillectomy    . Colonoscopy w/ polypectomy    . Esophagus surgery      Had esophagus stretched due to food getting trapped in his throat  . Craniotomy Bilateral 04/06/2014    Procedure: Bilateral Fronto-parietal craniotomy for resection of tumors with Curve;  Surgeon: Consuella Lose, MD;  Location: [facility] NEURO ORS;  Service: Neurosurgery;  Laterality: Bilateral;  Bilateral Fronto-parietal craniotomy for resection of tumors with Curve  . Colonoscopy N/A 04/19/2014    Procedure: COLONOSCOPY;  Surgeon: Winfield Cunas., MD;  Location: [facility] ENDOSCOPY;  Service: Endoscopy;  Laterality: N/A;    There were no vitals filed for this visit.  Visit Diagnosis:  Abnormality of gait       Subjective Assessment - 01/03/15 1112    Subjective Pt in bathroom during first 8 minutes of session. Pt reported he feels that he's regressed, due to being on vacation and not performing HEP or coming to PT (vacation and MD appointments). Pt's MD told pt tumors on adrenal glands have increased in size, indicating CA spread to adrenal glands. Pt to start new chemo pill soon and MD reports it will have side effects (nausea). Pt has fallen too many times to count, as legs fatigue and knees buckle.  Pt denied hitting head or any other injuries during falls.   Pertinent History Active prostate cancer with brain metastasis   Patient Stated Goals Walk without RW   Currently in Pain? No/denies                         [facility] Adult PT Treatment/Exercise - 01/03/15 1117    Transfers   Transfers Sit to Stand;Stand to Sit   Sit to Stand 5: Supervision;With upper extremity assist;From chair/3-in-1   Sit to Stand Details Verbal cues for technique   Sit to Stand Details (indicate cue type and reason) Performed x5. Cues to improve ant. weight shifting.   Stand to Sit 5: Supervision;With upper extremity assist;To chair/3-in-1   Stand to Sit Details (indicate cue type and reason) Verbal cues for technique   Stand to Sit Details x5.  Cues to improve eccentric control.   Ambulation/Gait   Ambulation/Gait Yes   Ambulation/Gait Assistance 5: Supervision;4: Min guard   Ambulation/Gait Assistance Details VC's to improve stride length, B heel strike, and to stay within RW. Pt required min guard one time when ambulating around turn, as he was outside RW for support. Pt required seated rest break after amb.   Ambulation Distance (Feet) 300 Feet   Assistive device Rolling walker   Gait Pattern Step-through pattern;Decreased dorsiflexion - left;Left steppage;Right flexed knee in stance;Left flexed knee in stance;Decreased trunk rotation;Decreased stride length;Decreased step length - right   Ambulation  Surface Level;Indoor   Gait velocity 1.39 ft/sec with RW   Standardized Balance Assessment   Standardized Balance Assessment Berg Balance Test   Berg Balance Test   Sit to Stand Able to stand  independently using hands   Standing Unsupported Able to stand safely 2 minutes   Sitting with Back Unsupported but Feet Supported on Floor or Stool Able to sit safely and securely 2 minutes   Stand to Sit Sits safely with minimal use of hands   Transfers Able to transfer safely, definite need of hands   Standing Unsupported with Eyes Closed Able to stand 10 seconds with supervision   Standing Ubsupported with Feet Together Needs help to attain position but able to stand for 30 seconds with feet together   From Standing, Reach Forward with Outstretched Arm Can reach confidently >25 cm (10")   From Standing Position, Pick up Object from Floor Able to pick up shoe, needs supervision   From Standing Position, Turn to Look Behind Over each Shoulder Looks behind one side only/other side shows less weight shift   Turn 360 Degrees Needs assistance while turning   Standing Unsupported, Alternately Place Feet on Step/Stool Able to complete >2 steps/needs minimal assist   Standing Unsupported, One Foot in Front Able to plae foot ahead of the other independently and hold 30 seconds   Standing on One Leg Tries to lift leg/unable to hold 3 seconds but remains standing independently   Total Score 37                PT Education - 01/03/15 1209    Education provided Yes   Education Details PT discussed discharging from PT after POC is finished in 2 weeks, and to focus on performing HEP as pt is starting a new chemo drug soon and might have difficulties coming to PT. PT suggested pt start PT again once HEP is easy and pt feels that he can come to OPPT. PT reiterated the importance of performing HEP at  home.   Person(s) Educated Patient   Methods Explanation   Comprehension Verbalized understanding           PT Short Term Goals - 08/08/14 0953    PT SHORT TERM GOAL #1   Title Pt will be independent in HEP to improve functional mobility. Target date: 08/08/14.   Status Partially Met   PT SHORT TERM GOAL #2   Title Asses BERG and write appropriate STG and LTG. Target date: 08/08/14.   Status Achieved   PT SHORT TERM GOAL #3   Title Pt will ambulated 300', with LRAD, over even/uneven terrain with supervision and no LOB to improve functional mobillity. Target date: 08/08/14.   Status Achieved   PT SHORT TERM GOAL #4   Title Pt will report zero falls in the last 4 weeks to improve safety during functional mobility. Target date: 08/08/14.  Baseline No falls in last 4 weeks.   Status Achieved   PT SHORT TERM GOAL #5   Title Pt will improve BERG balance score to >/=41/56 to decrease falls risk. Target date: 08/08/14.   Status Achieved           PT Long Term Goals - 01/03/15 1213    PT LONG TERM GOAL #1   Title Pt will verbalize understanding of falls prevention strategies to decrease falls risk. Target date: 09/05/14   Baseline Check LTGs after 30 days on 12/14/14 and continue unmet goals to new POC: 01/15/15.   Status Achieved   PT LONG TERM GOAL #2   Title Pt will ambulate 600', with LRAD, over even/uneven terrain at MOD I level in order to improve functional mobility at home and at work. Target date: 01/15/15.   Baseline 01/03/15: pt ambulated 300' with RW and min guard-supervision   Status Not Met   PT LONG TERM GOAL #3   Title Have pt complete FOTO and write appropriate goal. Target date: 09/05/14.   Status Achieved   PT LONG TERM GOAL #4   Title Pt will perform sit<>stand x10 with no UE support to improve safety during functional mobility. Target date: 01/15/15.   Baseline 01/03/15: Pt required UE support   Status Not Met   PT LONG TERM GOAL #5   Title Pt will improve gait speed, with LRAD, to >/=2.78f/sec. to ambulate safely in the community. Target date: 01/15/15.   Baseline 01/03/15. 1.311fsec  with RW  1.66 ft/sec   Status Not Met   PT LONG TERM GOAL #6   Title Pt will improve BERG balance score to >/=48/56 to decrease falls risk. Target date: 01/15/15.   Baseline 01/03/15: 37/56   Status Not Met   PT LONG TERM GOAL #7   Title Pt will improve ABC score by 10% to improve quality of life. Target date: 01/15/15.   Status On-going               Plan - 01/03/15 1211    Clinical Impression Statement Pt's BERG score and gait speed indicate pt is still at a falls risk. Pt's BERG score has decreased since last assessed, this could be due to pt missing one month of PT due to medical appointments and vacation. Continue with POC.   Pt will benefit from skilled therapeutic intervention in order to improve on the following deficits Abnormal gait;Impaired flexibility;Decreased range of motion;Decreased endurance;Decreased knowledge of use of DME;Decreased balance;Decreased mobility;Decreased strength   Rehab Potential Good   PT Frequency 1x / week   PT Duration 8 weeks   PT Treatment/Interventions ADLs/Self Care Home Management;Gait training;Neuromuscular re-education;Biofeedback;Functional mobility training;Patient/family education;Therapeutic activities;Therapeutic exercise;Manual techniques;Balance training;DME Instruction   PT Next Visit Plan Review HEP and modify as needed.   PT Home Exercise Plan Balance and strength HEP.    Consulted and Agree with Plan of Care Patient        Problem List Patient Active Problem List   Diagnosis Date Noted  . TIA (transient ischemic attack) 09/25/2014  . Left arm numbness 09/25/2014  . 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 Hypotension 05/22/2014  . Hypoalbuminemia 05/22/2014  . Small cell carcinoma of prostate   . Vision disturbance   . Cerebral meningioma   . Left hemiparesis 04/09/2014  . Brain metastases 04/06/2014  . Poor compliance with F/U 02/04/2014  . 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 Medication management  08/20/2013  . Hypertension   .  Hyperlipidemia   . GERD (gastroesophageal reflux disease)   . Vitamin D deficiency   . History of colon polyps     Ella Guillotte L 01/03/2015, 12:16 PM  [facility] [address] [place], [place], [address] Phone: [phone]   Fax:  [phone]     Geoffry Paradise, PT,DPT 01/03/2015 12:17 PM Phone: [phone] Fax: [phone]

## 2015-01-03 NOTE — Progress Notes (Signed)
I faxed form from [employer] for prior auth for ondanstetron   [phone]

## 2015-01-03 NOTE — Telephone Encounter (Signed)
PT.'S WIFE CALLED CONCERNING TEMODAR. INFORMED PT.'S WIFE THAT THE TEMODAR PRESCRIPTION HAS BEEN SENT TO [facility]. SHE WILL BE CALLED BY [facility] WHEN TEMODAR IS READY TO BE SHIPPED TO PT.

## 2015-01-03 NOTE — Addendum Note (Signed)
Addended by: Domenic Schwab on: 01/03/2015 10:12 AM   Modules accepted: Orders

## 2015-01-03 NOTE — Progress Notes (Signed)
I called and left Ivan Stark a message in regards to ondansetron prior auth  [phone]. She had left me a message.

## 2015-01-03 NOTE — Telephone Encounter (Signed)
PT.'S TEMODAR PRESCRIPTION NEEDS TO BE SENT TO [facility]. IT WILL NEED A PRIOR AUTHORIZATION. [facility] WILL BEGIN WORK ON THE PRIOR AUTHORIZATION. MANAGED CARE WILL NEED TO CALL [facility] AFTER  THE PRESCRIPTION IS SENT TO [facility] SO SHE CAN GIVE [facility] THE AUTHORIZATION NUMBER.  [facility]'S PHONE NUMBER [phone].

## 2015-01-03 NOTE — Progress Notes (Signed)
I placed form from [employer] on desk of nurse for dr. Benay Spice for ondansetron

## 2015-01-07 ENCOUNTER — Telehealth: Payer: Self-pay | Admitting: *Deleted

## 2015-01-07 ENCOUNTER — Ambulatory Visit: Payer: Self-pay | Admitting: Internal Medicine

## 2015-01-07 NOTE — Telephone Encounter (Signed)
Message from pt to follow up on chemo prescription. Was told it would be shipped to home. Has not heard from pharmacy. Called [facility] to follow up, they were missing insurance and ICD10 information. ICD code given verbally. Insurance face-sheet faxed.

## 2015-01-08 ENCOUNTER — Encounter: Payer: Self-pay | Admitting: Oncology

## 2015-01-08 NOTE — Progress Notes (Signed)
Per [employer] ondansetron approved thru 01/02/16. I will send to medical records.

## 2015-01-08 NOTE — Telephone Encounter (Signed)
Called [facility], spoke with [place]. They received insurance and ICD code. Temodar prescription is still being processed. She will change priority to stat and call office with an update when she has one.  Called pt with above information. He voiced understanding and appreciation for call.

## 2015-01-09 ENCOUNTER — Telehealth: Payer: Self-pay | Admitting: *Deleted

## 2015-01-09 NOTE — Telephone Encounter (Signed)
Patient called requesting call from Dr. Benay Spice or his nurse.  "I just received a call from [facility] [phone]) in reference to Temodar.  This medication is too expensive.  They will call me with a medical alternative/substitution that cost a lot less.  Will you let Dr. Benay Spice and his nurse know."  Patient's return number is [phone].

## 2015-01-10 ENCOUNTER — Ambulatory Visit: Payer: Medicare Other

## 2015-01-10 NOTE — Telephone Encounter (Signed)
Spoke with Aldona Bar with [facility]. They are "still working on billing his Medicare." They have not received any denial from insurance at this point. They will call with an update when they have one. Spoke with pt's wife, she states they were given a co-pay amount last night and that [facility] will contact them today to set up delivery. Contact # for [facility] given to Mrs. Joya Gaskins. She will call if she does not hear from them soon.

## 2015-01-10 NOTE — Telephone Encounter (Signed)
Call from [place] with [facility] to clarify pt's Temodar cycle. Confirmed he will take Temodar days 1-5 of 28 day cycle. Pt's co-pay will be $161.08, they will call him to arrange delivery.

## 2015-01-11 ENCOUNTER — Telehealth: Payer: Self-pay | Admitting: *Deleted

## 2015-01-11 ENCOUNTER — Telehealth: Payer: Self-pay | Admitting: Oncology

## 2015-01-11 NOTE — Telephone Encounter (Signed)
Message from pt's wife asking when should he begin Temodar? Returned call. Pt to begin Temodar today. Take one every evening for 5 days. Will reschedule office visit to 8/22. Made Mrs. Wheatley aware. She reports there was a warning to avoid aspirin while taking Temodar. Pt takes 81 mg daily. Reviewed with Lattie Haw, NP: OK to continue low dose aspirin.

## 2015-01-11 NOTE — Telephone Encounter (Signed)
all three telephone numbers are busy.....mailed pt appt sched and letter

## 2015-01-15 ENCOUNTER — Encounter: Payer: Self-pay | Admitting: Oncology

## 2015-01-15 NOTE — Progress Notes (Signed)
Per sloane at [facility] the patient's temodar was delivered 01/11/15 and his copay is 161.06

## 2015-01-15 NOTE — Progress Notes (Signed)
Per [employer] temodar approved 01/09/15-01/09/16. I will send to medical records.

## 2015-01-16 ENCOUNTER — Telehealth: Payer: Self-pay | Admitting: *Deleted

## 2015-01-16 MED ORDER — LACTULOSE 20 GM/30ML PO SOLN: 15.0000 mL | Freq: Two times a day (BID) | ORAL | Status: AC

## 2015-01-16 NOTE — Telephone Encounter (Signed)
Call from pt reporting constipation. Has tried to remove stool digitally with little result. Has been taking Miralax BID and Senokot daily. Reviewed with Ned Card, NP: Order received for Lactulose BID. Instructions reviewed with pt and wife. Rx sent electronically.

## 2015-01-17 ENCOUNTER — Ambulatory Visit: Payer: Medicare Other | Attending: Physical Medicine & Rehabilitation

## 2015-01-17 ENCOUNTER — Telehealth: Payer: Self-pay

## 2015-01-17 NOTE — Telephone Encounter (Signed)
Mr. Silvestro not available but spoke with his wife regarding his no-show (PT appt 01/17/15). Pt's wife states it is difficult for pt to come to OPPT neuro and PT agrees and suggested HHPT until pt can tolerate getting to OPPT neuro, as his balance is still impaired. PT will d/c pt and forward note to MD.

## 2015-01-18 NOTE — Therapy (Signed)
[facility] [address] [place], [place], [address] Phone: [phone]   Fax:  [phone]  Patient Details  Name: Ivan Stark MRN: [id number] Date of Birth: 1945-11-24 Referring Provider:  No ref. provider found  Encounter Date: 01/18/2015   Ivan Stark 01/18/2015, 9:26 AM  [facility] [address] [place], [place], [address] Phone: [phone]   Fax:  [phone]   PHYSICAL THERAPY DISCHARGE SUMMARY  Visits from Start of Care: 22  Current functional level related to goals / functional outcomes:     PT Short Term Goals - 08/08/14 0953    PT SHORT TERM GOAL #1   Title Pt will be independent in HEP to improve functional mobility. Target date: 08/08/14.   Status Partially Met   PT SHORT TERM GOAL #2   Title Asses BERG and write appropriate STG and LTG. Target date: 08/08/14.   Status Achieved   PT SHORT TERM GOAL #3   Title Pt will ambulated 300', with LRAD, over even/uneven terrain with supervision and no LOB to improve functional mobillity. Target date: 08/08/14.   Status Achieved   PT SHORT TERM GOAL #4   Title Pt will report zero falls in the last 4 weeks to improve safety during functional mobility. Target date: 08/08/14.   Baseline No falls in last 4 weeks.   Status Achieved   PT SHORT TERM GOAL #5   Title Pt will improve BERG balance score to >/=41/56 to decrease falls risk. Target date: 08/08/14.   Status Achieved         PT Long Term Goals - 01/03/15 1213    PT LONG TERM GOAL #1   Title Pt will verbalize understanding of falls prevention strategies to decrease falls risk. Target date: 09/05/14   Baseline Check LTGs after 30 days on 12/14/14 and continue unmet goals to new POC: 01/15/15.   Status Achieved   PT LONG TERM GOAL #2   Title Pt will ambulate 600', with LRAD, over even/uneven terrain at MOD I level in order to improve functional  mobility at home and at work. Target date: 01/15/15.   Baseline 01/03/15: pt ambulated 300' with RW and min guard-supervision   Status Not Met   PT LONG TERM GOAL #3   Title Have pt complete FOTO and write appropriate goal. Target date: 09/05/14.   Status Achieved   PT LONG TERM GOAL #4   Title Pt will perform sit<>stand x10 with no UE support to improve safety during functional mobility. Target date: 01/15/15.   Baseline 01/03/15: Pt required UE support   Status Not Met   PT LONG TERM GOAL #5   Title Pt will improve gait speed, with LRAD, to >/=2.75f/sec. to ambulate safely in the community. Target date: 01/15/15.   Baseline 01/03/15. 1.329fsec with RW  1.66 ft/sec   Status Not Met   PT LONG TERM GOAL #6   Title Pt will improve BERG balance score to >/=48/56 to decrease falls risk. Target date: 01/15/15.   Baseline 01/03/15: 37/56   Status Not Met   PT LONG TERM GOAL #7   Title Pt will improve ABC score by 10% to improve quality of life. Target date: 01/15/15.   Status On-going        Remaining deficits: Pt had difficulty making OPPT neuro appointments due to cancer treatment and vacation over the last few months. Pt's cancer has spread and he is currently undergoing chemo therapy. Pt has  experienced decline in balance, strength, and safety during ambulation over the last few months. Pt would benefit from home health PT (HHPT) services until he is able to tolerate OPPT (outpatient PT) services. PT spoke with pt's wife, as pt no-showed to PT appointment yesterday and they would like HHPT. Pt's wife also reported pt has been experiencing memory impairments.   Education / Equipment: HEP  Plan: Patient agrees to discharge.  Patient goals were partially met. Patient is being discharged due to a change in medical status.  ?????       Ivan Stark, PT,DPT 01/18/2015 9:26 AM Phone: [phone] Fax: [phone]

## 2015-01-21 ENCOUNTER — Other Ambulatory Visit: Payer: Medicare Other

## 2015-01-21 ENCOUNTER — Ambulatory Visit: Payer: Medicare Other | Admitting: Nurse Practitioner

## 2015-01-26 ENCOUNTER — Observation Stay (HOSPITAL_COMMUNITY): Payer: Medicare Other

## 2015-01-26 ENCOUNTER — Encounter (HOSPITAL_COMMUNITY): Payer: Medicare Other

## 2015-01-26 ENCOUNTER — Emergency Department (HOSPITAL_COMMUNITY): Payer: Medicare Other

## 2015-01-26 ENCOUNTER — Encounter (HOSPITAL_COMMUNITY): Payer: Self-pay | Admitting: Emergency Medicine

## 2015-01-26 ENCOUNTER — Inpatient Hospital Stay (HOSPITAL_COMMUNITY)
Admission: EM | Admit: 2015-01-26 | Discharge: 2015-01-29 | DRG: 065 | Disposition: A | Discharge status: Skilled Nursing Facility | Payer: Medicare Other | Attending: Internal Medicine | Admitting: Internal Medicine

## 2015-01-26 DIAGNOSIS — I6340 Cerebral infarction due to embolism of unspecified cerebral artery: Secondary | ICD-10-CM

## 2015-01-26 DIAGNOSIS — R51 Headache: Secondary | ICD-10-CM

## 2015-01-26 DIAGNOSIS — Z923 Personal history of irradiation: Secondary | ICD-10-CM

## 2015-01-26 DIAGNOSIS — Z7982 Long term (current) use of aspirin: Secondary | ICD-10-CM

## 2015-01-26 DIAGNOSIS — R5381 Other malaise: Secondary | ICD-10-CM | POA: Diagnosis present

## 2015-01-26 DIAGNOSIS — Z9889 Other specified postprocedural states: Secondary | ICD-10-CM

## 2015-01-26 DIAGNOSIS — Z888 Allergy status to other drugs, medicaments and biological substances status: Secondary | ICD-10-CM

## 2015-01-26 DIAGNOSIS — G459 Transient cerebral ischemic attack, unspecified: Secondary | ICD-10-CM

## 2015-01-26 DIAGNOSIS — G608 Other hereditary and idiopathic neuropathies: Secondary | ICD-10-CM | POA: Diagnosis present

## 2015-01-26 DIAGNOSIS — I10 Essential (primary) hypertension: Secondary | ICD-10-CM | POA: Diagnosis present

## 2015-01-26 DIAGNOSIS — T451X5A Adverse effect of antineoplastic and immunosuppressive drugs, initial encounter: Secondary | ICD-10-CM | POA: Diagnosis present

## 2015-01-26 DIAGNOSIS — Z8249 Family history of ischemic heart disease and other diseases of the circulatory system: Secondary | ICD-10-CM

## 2015-01-26 DIAGNOSIS — I639 Cerebral infarction, unspecified: Principal | ICD-10-CM | POA: Diagnosis present

## 2015-01-26 DIAGNOSIS — E1140 Type 2 diabetes mellitus with diabetic neuropathy, unspecified: Secondary | ICD-10-CM | POA: Diagnosis present

## 2015-01-26 DIAGNOSIS — C7931 Secondary malignant neoplasm of brain: Secondary | ICD-10-CM

## 2015-01-26 DIAGNOSIS — R296 Repeated falls: Secondary | ICD-10-CM | POA: Diagnosis present

## 2015-01-26 DIAGNOSIS — E785 Hyperlipidemia, unspecified: Secondary | ICD-10-CM | POA: Diagnosis present

## 2015-01-26 DIAGNOSIS — R2689 Other abnormalities of gait and mobility: Secondary | ICD-10-CM | POA: Diagnosis present

## 2015-01-26 DIAGNOSIS — K5900 Constipation, unspecified: Secondary | ICD-10-CM | POA: Diagnosis present

## 2015-01-26 DIAGNOSIS — R531 Weakness: Secondary | ICD-10-CM | POA: Diagnosis present

## 2015-01-26 DIAGNOSIS — R29898 Other symptoms and signs involving the musculoskeletal system: Secondary | ICD-10-CM | POA: Diagnosis not present

## 2015-01-26 DIAGNOSIS — E559 Vitamin D deficiency, unspecified: Secondary | ICD-10-CM | POA: Diagnosis present

## 2015-01-26 DIAGNOSIS — Z79899 Other long term (current) drug therapy: Secondary | ICD-10-CM

## 2015-01-26 DIAGNOSIS — E1142 Type 2 diabetes mellitus with diabetic polyneuropathy: Secondary | ICD-10-CM | POA: Diagnosis present

## 2015-01-26 DIAGNOSIS — H538 Other visual disturbances: Secondary | ICD-10-CM | POA: Diagnosis present

## 2015-01-26 DIAGNOSIS — Z809 Family history of malignant neoplasm, unspecified: Secondary | ICD-10-CM

## 2015-01-26 DIAGNOSIS — K219 Gastro-esophageal reflux disease without esophagitis: Secondary | ICD-10-CM | POA: Diagnosis present

## 2015-01-26 DIAGNOSIS — C7970 Secondary malignant neoplasm of unspecified adrenal gland: Secondary | ICD-10-CM | POA: Diagnosis present

## 2015-01-26 DIAGNOSIS — R519 Headache, unspecified: Secondary | ICD-10-CM

## 2015-01-26 DIAGNOSIS — D320 Benign neoplasm of cerebral meninges: Secondary | ICD-10-CM | POA: Diagnosis present

## 2015-01-26 DIAGNOSIS — C61 Malignant neoplasm of prostate: Secondary | ICD-10-CM | POA: Diagnosis present

## 2015-01-26 DIAGNOSIS — R634 Abnormal weight loss: Secondary | ICD-10-CM | POA: Diagnosis present

## 2015-01-26 LAB — I-STAT CHEM 8, ED
BUN: 15 mg/dL (ref 6–20)
CALCIUM ION: 1.08 mmol/L — AB (ref 1.13–1.30)
Chloride: 107 mmol/L (ref 101–111)
Creatinine, Ser: 0.9 mg/dL (ref 0.61–1.24)
GLUCOSE: 137 mg/dL — AB (ref 65–99)
HCT: 43 % (ref 39.0–52.0)
Hemoglobin: 14.6 g/dL (ref 13.0–17.0)
Potassium: 3.8 mmol/L (ref 3.5–5.1)
SODIUM: 140 mmol/L (ref 135–145)
TCO2: 21 mmol/L (ref 0–100)

## 2015-01-26 LAB — CBC WITH DIFFERENTIAL/PLATELET
Basophils Absolute: 0 10*3/uL (ref 0.0–0.1)
Basophils Relative: 0 % (ref 0–1)
Eosinophils Absolute: 0.1 10*3/uL (ref 0.0–0.7)
Eosinophils Relative: 2 % (ref 0–5)
HCT: 41.7 % (ref 39.0–52.0)
HEMOGLOBIN: 13.5 g/dL (ref 13.0–17.0)
Lymphocytes Relative: 18 % (ref 12–46)
Lymphs Abs: 1 10*3/uL (ref 0.7–4.0)
MCH: 29 pg (ref 26.0–34.0)
MCHC: 32.4 g/dL (ref 30.0–36.0)
MCV: 89.7 fL (ref 78.0–100.0)
MONO ABS: 0.6 10*3/uL (ref 0.1–1.0)
Monocytes Relative: 10 % (ref 3–12)
NEUTROS ABS: 3.8 10*3/uL (ref 1.7–7.7)
NEUTROS PCT: 70 % (ref 43–77)
Platelets: 230 10*3/uL (ref 150–400)
RBC: 4.65 MIL/uL (ref 4.22–5.81)
RDW: 14.9 % (ref 11.5–15.5)
WBC: 5.5 10*3/uL (ref 4.0–10.5)

## 2015-01-26 LAB — COMPREHENSIVE METABOLIC PANEL
ALBUMIN: 3.3 g/dL — AB (ref 3.5–5.0)
ALK PHOS: 115 U/L (ref 38–126)
ALT: 19 U/L (ref 17–63)
ANION GAP: 5 (ref 5–15)
AST: 20 U/L (ref 15–41)
BUN: 16 mg/dL (ref 6–20)
CALCIUM: 9 mg/dL (ref 8.9–10.3)
CHLORIDE: 109 mmol/L (ref 101–111)
CO2: 24 mmol/L (ref 22–32)
Creatinine, Ser: 0.79 mg/dL (ref 0.61–1.24)
GFR calc non Af Amer: >60 mL/min (ref >60)
GLUCOSE: 179 mg/dL — AB (ref 65–99)
POTASSIUM: 4.1 mmol/L (ref 3.5–5.1)
SODIUM: 138 mmol/L (ref 135–145)
Total Bilirubin: 0.8 mg/dL (ref 0.3–1.2)
Total Protein: 6.4 g/dL — ABNORMAL LOW (ref 6.5–8.1)

## 2015-01-26 LAB — PROTIME-INR
INR: 0.95 (ref 0.00–1.49)
Prothrombin Time: 12.9 seconds (ref 11.6–15.2)

## 2015-01-26 LAB — URINALYSIS, ROUTINE W REFLEX MICROSCOPIC
BILIRUBIN URINE: NEGATIVE
Glucose, UA: NEGATIVE mg/dL
HGB URINE DIPSTICK: NEGATIVE
Ketones, ur: 40 mg/dL — AB
Leukocytes, UA: NEGATIVE
Nitrite: NEGATIVE
Protein, ur: NEGATIVE mg/dL
SPECIFIC GRAVITY, URINE: 1.031 — AB (ref 1.005–1.030)
UROBILINOGEN UA: 0.2 mg/dL (ref 0.0–1.0)
pH: 5 (ref 5.0–8.0)

## 2015-01-26 LAB — RAPID URINE DRUG SCREEN, HOSP PERFORMED
AMPHETAMINES: NOT DETECTED
BENZODIAZEPINES: NOT DETECTED
Barbiturates: NOT DETECTED
COCAINE: NOT DETECTED
OPIATES: NOT DETECTED
TETRAHYDROCANNABINOL: NOT DETECTED

## 2015-01-26 LAB — CBG MONITORING, ED: Glucose-Capillary: 173 mg/dL — ABNORMAL HIGH (ref 65–99)

## 2015-01-26 LAB — GLUCOSE, CAPILLARY
Glucose-Capillary: 150 mg/dL — ABNORMAL HIGH (ref 65–99)
Glucose-Capillary: 212 mg/dL — ABNORMAL HIGH (ref 65–99)

## 2015-01-26 MED ORDER — ASPIRIN 325 MG PO TABS
325.0000 mg | ORAL_TABLET | Freq: Every day | ORAL | Status: DC
  Administered 2015-01-26 – 2015-01-29 (×4): 325 mg via ORAL
  Filled 2015-01-26 (×4): qty 1

## 2015-01-26 MED ORDER — ENOXAPARIN SODIUM 40 MG/0.4ML ~~LOC~~ SOLN
40.0000 mg | SUBCUTANEOUS | Status: DC
  Administered 2015-01-26 – 2015-01-29 (×4): 40 mg via SUBCUTANEOUS
  Filled 2015-01-26 (×5): qty 0.4

## 2015-01-26 MED ORDER — ACETAMINOPHEN 325 MG PO TABS: 650.0000 mg | ORAL_TABLET | ORAL | Status: DC | PRN

## 2015-01-26 MED ORDER — KETOROLAC TROMETHAMINE 30 MG/ML IJ SOLN
30.0000 mg | Freq: Once | INTRAMUSCULAR | Status: AC
  Administered 2015-01-26: 30 mg via INTRAVENOUS
  Filled 2015-01-26: qty 1

## 2015-01-26 MED ORDER — ASPIRIN EC 325 MG PO TBEC: 325.0000 mg | DELAYED_RELEASE_TABLET | Freq: Every day | ORAL | Status: DC

## 2015-01-26 MED ORDER — ALPRAZOLAM 0.5 MG PO TABS: 0.5000 mg | ORAL_TABLET | Freq: Two times a day (BID) | ORAL | Status: DC | PRN

## 2015-01-26 MED ORDER — SENNA 8.6 MG PO TABS
2.0000 | ORAL_TABLET | Freq: Two times a day (BID) | ORAL | Status: DC
  Administered 2015-01-26 – 2015-01-29 (×6): 17.2 mg via ORAL
  Filled 2015-01-26 (×6): qty 2

## 2015-01-26 MED ORDER — ACETAMINOPHEN 650 MG RE SUPP: 650.0000 mg | RECTAL | Status: DC | PRN

## 2015-01-26 MED ORDER — INSULIN ASPART 100 UNIT/ML ~~LOC~~ SOLN
0.0000 [IU] | Freq: Three times a day (TID) | SUBCUTANEOUS | Status: DC
  Administered 2015-01-26: 2 [IU] via SUBCUTANEOUS
  Administered 2015-01-27 (×2): 5 [IU] via SUBCUTANEOUS
  Administered 2015-01-27 – 2015-01-28 (×2): 2 [IU] via SUBCUTANEOUS
  Administered 2015-01-29 (×2): 3 [IU] via SUBCUTANEOUS

## 2015-01-26 MED ORDER — DEXAMETHASONE SODIUM PHOSPHATE 10 MG/ML IJ SOLN
10.0000 mg | Freq: Once | INTRAMUSCULAR | Status: AC
  Administered 2015-01-26: 10 mg via INTRAVENOUS
  Filled 2015-01-26: qty 1

## 2015-01-26 MED ORDER — LORAZEPAM 2 MG/ML IJ SOLN
1.0000 mg | Freq: Once | INTRAMUSCULAR | Status: AC
  Administered 2015-01-26: 1 mg via INTRAVENOUS

## 2015-01-26 MED ORDER — STROKE: EARLY STAGES OF RECOVERY BOOK
Freq: Once | Status: AC
  Administered 2015-01-26: 09:00:00
  Filled 2015-01-26: qty 1

## 2015-01-26 MED ORDER — ASPIRIN 300 MG RE SUPP: 300.0000 mg | Freq: Every day | RECTAL | Status: DC

## 2015-01-26 MED ORDER — INSULIN ASPART 100 UNIT/ML ~~LOC~~ SOLN
0.0000 [IU] | Freq: Every day | SUBCUTANEOUS | Status: DC
  Administered 2015-01-26: 2 [IU] via SUBCUTANEOUS

## 2015-01-26 MED ORDER — LORAZEPAM 2 MG/ML IJ SOLN
INTRAMUSCULAR | Status: AC
  Administered 2015-01-26: 1 mg via INTRAVENOUS
  Filled 2015-01-26: qty 1

## 2015-01-26 NOTE — ED Provider Notes (Signed)
CSN: [id number]     Arrival date & time 01/26/15  2841 History  This chart was scribed for Navarre Diana, MD by Julien Nordmann, ED Scribe. This patient was seen in room RESA/RESA and the patient's care was started at 3:39 AM.    Chief Complaint  Patient presents with  . Headache   LEVEL 5 CAVEAT DUE TO MENTAL STATUS  Patient is a 69 y.o. male presenting with headaches. The history is provided by the patient. The history is limited by the condition of the patient. No language interpreter was used.  Headache Timing:  Constant Progression:  Improving Chronicity:  New Context: not coughing   Relieved by:  Nothing Worsened by:  Nothing Ineffective treatments:  None tried Associated symptoms: no diarrhea, no neck pain, no neck stiffness and no vomiting    HPI Comments: Ivan Stark is a 69 y.o. male who has hx of prostate cancer, brain cancer, HTN, hyperlipidemia and past surgical hx of bilateral craniotomy presents to the Emergency Department complaining of a sudden onset, gradual worsening left sided headache about 8 hours ago. Pt notes having associated constipation, dry heaves and being "foggy" in his brain. He states he has not had this happen before. He denies vomiting and diarrhea.  Past Medical History  Diagnosis Date  . Hypertension   . Hyperlipidemia   . GERD (gastroesophageal reflux disease)   . Vitamin D deficiency   . History of colon polyps   . Complication of anesthesia     Difficult to wake up; and severe nausea and vomiting  . PONV (postoperative nausea and vomiting)   . Diabetes mellitus without complication     metformin  . Cancer 03/2014    prostate ca   Past Surgical History  Procedure Laterality Date  . Tonsillectomy    . Colonoscopy w/ polypectomy    . Esophagus surgery      Had esophagus stretched due to food getting trapped in his throat  . Craniotomy Bilateral 04/06/2014    Procedure: Bilateral Fronto-parietal craniotomy for resection of tumors with  Curve;  Surgeon: Consuella Lose, MD;  Location: [facility];  Service: Neurosurgery;  Laterality: Bilateral;  Bilateral Fronto-parietal craniotomy for resection of tumors with Curve  . Colonoscopy N/A 04/19/2014    Procedure: COLONOSCOPY;  Surgeon: Winfield Cunas., MD;  Location: [facility] END[facility]COPY;  Service: Endoscopy;  Laterality: N/A;   Family History  Problem Relation Age of Onset  . Goiter Mother   . Hypertension Father   . Cancer Father     colon   Social History  Substance Use Topics  . Smoking status: Never Smoker   . Smokeless tobacco: Not on file  . Alcohol Use: No    Review of Systems  Unable to perform ROS Gastrointestinal: Positive for constipation. Negative for vomiting and diarrhea.  Musculoskeletal: Negative for neck pain and neck stiffness.  Neurological: Positive for headaches.      Allergies  Atenolol and Lipitor  Home Medications   Prior to Admission medications   Medication Sig Start Date End Date Taking? Authorizing Provider  ALPRAZolam Duanne Moron) 0.5 MG tablet Take 1 tablet (0.5 mg total) by mouth 2 (two) times daily as needed for anxiety. 09/21/14   Owens Shark, NP  Ascorbic Acid (VITAMIN C) 1000 MG tablet Take 1,000 mg by mouth daily.    Historical Provider, MD  aspirin EC 325 MG EC tablet Take 1 tablet (325 mg total) by mouth daily. 09/26/14   Shanker Kristeen Mans,  MD  Cholecalciferol (VITAMIN D) 2000 UNITS CAPS Take 4,000 Units by mouth daily.     Historical Provider, MD  enalapril (VASOTEC) 20 MG tablet  11/14/14   Historical Provider, MD  glimepiride (AMARYL) 4 MG tablet Take 1/2 to 1 tablet BID or as directed. 01/02/15   Unk Pinto, MD  GLUCERNA [facility]) LIQD Take 237 mLs by mouth 2 (two) times a week.     Historical Provider, MD  HYDROcodone-acetaminophen (NORCO/VICODIN) 5-325 MG per tablet Take 1-2 tablets by mouth every 6 (six) hours as needed for moderate pain. 08/24/14   Susanne Borders, NP  Lactulose 20 GM/30ML SOLN Take 15 mLs (10 g total)  by mouth 2 (two) times daily. 01/16/15   Owens Shark, NP  metFORMIN (GLUCOPHAGE-XR) 500 MG 24 hr tablet Take 1 tablet 2 x day with food & need Office Visit before refills. 12/06/14   Unk Pinto, MD  metFORMIN (GLUCOPHAGE-XR) 500 MG 24 hr tablet TAKE ONE TABLET BY MOUTH TWICE DAILY WITH BREAKFAST AND LUNCH 01/01/15   Unk Pinto, MD  Multiple Vitamin (MULTIVITAMIN WITH MINERALS) TABS Take 1 tablet by mouth daily.    Historical Provider, MD  ondansetron (ZOFRAN) 8 MG tablet Take 1 tablet (8 mg total) by mouth daily. Take prior to Temodar 12/31/14   Ladell Pier, MD  polyethylene glycol [facility] Floria Raveling) packet Take 17 g by mouth daily.    Historical Provider, MD  prochlorperazine (COMPAZINE) 10 MG tablet Take 1 tablet (10 mg total) by mouth every 6 (six) hours as needed for nausea or vomiting. 05/16/14   Ladell Pier, MD  senna (SENOKOT) 8.6 MG TABS tablet Take 2 tablets (17.2 mg total) by mouth 2 (two) times daily. For constipation. Adjust as  needed Patient taking differently: Take 1 tablet by mouth every evening. For constipation. Adjust as  needed 04/27/14   Bary Leriche, PA-C  temozolomide (TEMODAR) 140 MG capsule Take 2 tablets (=280mg ) daily for 5 days.  Start 01/07/15  May take on an empty stomach or at bedtime to decrease nausea & vomiting. 01/03/15   Ladell Pier, MD   Triage vitals: BP 141/86 mmHg  Pulse 81  Temp(Src) 97.9 F (36.6 C) (Oral)  Resp 20  Ht 6\' 4"  (1.93 m)  Wt 175 lb (79.379 kg)  BMI 21.31 kg/m2  SpO2 99% Physical Exam  Constitutional: He is oriented to person, place, and time. He appears well-developed and well-nourished. No distress.  HENT:  Head: Normocephalic.  Mouth/Throat: Oropharynx is clear and moist.  Eyes: Conjunctivae are normal. Pupils are equal, round, and reactive to light. No scleral icterus.  Neck: Normal range of motion. Neck supple. No tracheal deviation present. No thyromegaly present.  No meningismus  Cardiovascular: Normal rate  and regular rhythm.  Exam reveals no gallop and no friction rub.   No murmur heard. Pulmonary/Chest: Effort normal and breath sounds normal. No respiratory distress. He has no wheezes. He has no rales.  Lungs are clear  Abdominal: Soft. Bowel sounds are normal. He exhibits no distension and no mass. There is no tenderness. There is no rebound and no guarding.  Musculoskeletal: Normal range of motion. He exhibits no edema or tenderness.  Lymphadenopathy:    He has no cervical adenopathy.  Neurological: He is alert and oriented to person, place, and time. He has normal reflexes. He displays normal reflexes. No cranial nerve deficit.  Cranial nerves 2-12 intact, 5 out of 5 grip strength B, 5-/5 LLE strength  Skin:  Skin is warm and dry. No rash noted.  Psychiatric:  Slow thinking confusions   Nursing note and vitals reviewed.   ED Course  Procedures  DIAGNOSTIC STUDIES: Oxygen Saturation is 99% on RA, normal by my interpretation.  COORDINATION OF CARE:  3:43 AM Discussed treatment plan with pt at bedside and pt agreed to plan.  Labs Review Labs Reviewed - No data to display  Imaging Review No results found. I have personally reviewed and evaluated these images and lab results as part of my medical decision-making.   EKG Interpretation None      MDM   Final diagnoses:  None   Results for orders placed or performed during the hospital encounter of 01/26/15  CBC with Differential/Platelet  Result Value Ref Range   WBC 5.5 4.0 - 10.5 K/uL   RBC 4.65 4.22 - 5.81 MIL/uL   Hemoglobin 13.5 13.0 - 17.0 g/dL   HCT 41.7 39.0 - 52.0 %   MCV 89.7 78.0 - 100.0 fL   MCH 29.0 26.0 - 34.0 pg   MCHC 32.4 30.0 - 36.0 g/dL   RDW 14.9 11.5 - 15.5 %   Platelets 230 150 - 400 K/uL   Neutrophils Relative % 70 43 - 77 %   Neutro Abs 3.8 1.7 - 7.7 K/uL   Lymphocytes Relative 18 12 - 46 %   Lymphs Abs 1.0 0.7 - 4.0 K/uL   Monocytes Relative 10 3 - 12 %   Monocytes Absolute 0.6 0.1 - 1.0  K/uL   Eosinophils Relative 2 0 - 5 %   Eosinophils Absolute 0.1 0.0 - 0.7 K/uL   Basophils Relative 0 0 - 1 %   Basophils Absolute 0.0 0.0 - 0.1 K/uL  I-Stat Chem 8, ED  Result Value Ref Range   Sodium 140 135 - 145 mmol/L   Potassium 3.8 3.5 - 5.1 mmol/L   Chloride 107 101 - 111 mmol/L   BUN 15 6 - 20 mg/dL   Creatinine, Ser 0.90 0.61 - 1.24 mg/dL   Glucose, Bld 137 (H) 65 - 99 mg/dL   Calcium, Ion 1.08 (L) 1.13 - 1.30 mmol/L   TCO2 21 0 - 100 mmol/L   Hemoglobin 14.6 13.0 - 17.0 g/dL   HCT 43.0 39.0 - 52.0 %   Dg Chest 2 View  01/26/2015   CLINICAL DATA:  Sudden onset headache.  Weakness and cancer.  EXAM: CHEST  2 VIEW  COMPARISON:  Chest CT 12/27/2014  FINDINGS: Normal heart size and stable mediastinal contours, including lower mediastinal widening from hiatal hernia. There is no edema, consolidation, effusion, or pneumothorax.  IMPRESSION: No active cardiopulmonary disease.   Electronically Signed   By: Monte Fantasia M.D.   On: 01/26/2015 05:36   Ct Head Wo Contrast  01/26/2015   CLINICAL DATA:  Headache and history of brain cancer. Metastatic prostate cancer and history of left frontal meningioma.  EXAM: CT HEAD WITHOUT CONTRAST  TECHNIQUE: Contiguous axial images were obtained from the base of the skull through the vertex without intravenous contrast.  COMPARISON:  09/25/2014  FINDINGS: Skull and Sinuses:Stable appearance of craniotomy at the vertex.  Interval decrease in right mastoid effusion, now layering in the mastoid tip. Small effusion also present in the left mastoid tip.  Orbits: No acute abnormality.  Brain: No evidence of acute infarction, hemorrhage, hydrocephalus, or mass lesion/mass effect.  Stable parasagittal posterior bifrontal gliosis at resected masses. A 7 mm fallcine mass noted on brain MRI Bedie Dominey 2016 is not clearly  visible. No evidence of new intracranial metastatic disease.  Chronic small vessel disease with ischemic gliosis confluent throughout the bilateral  cerebral white matter. Generalized cortical atrophy.  IMPRESSION: 1. No acute intracranial finding. 2. Chronic small vessel disease. 3. Stable post treatment changes at the vertex related to meningioma and metastasis resections. 4. Bilateral mastoid effusions, decreased on the right since Evalyne Cortopassi 2016.   Electronically Signed   By: Monte Fantasia M.D.   On: 01/26/2015 04:16   Ct Chest W Contrast  12/27/2014   CLINICAL DATA:  Subsequent evaluation of a 69 year old male with history of prostate cancer with metastatic disease to the brain undergoing ongoing chemotherapy. Restaging examination.  EXAM: CT CHEST, ABDOMEN, AND PELVIS WITH CONTRAST  TECHNIQUE: Multidetector CT imaging of the chest, abdomen and pelvis was performed following the standard protocol during bolus administration of intravenous contrast.  CONTRAST:  153mL OMNIPAQUE IOHEXOL 300 MG/ML  SOLN  COMPARISON:  CT of the chest, abdomen and pelvis 10/10/2014.  FINDINGS: CT CHEST FINDINGS  Mediastinum/Lymph Nodes: Heart size is normal. Small amount of anterior pericardial fluid and/or thickening, unlikely to be of any hemodynamic significance at this time. No associated pericardial calcification. There is atherosclerosis of the thoracic aorta, the great vessels of the mediastinum and the coronary arteries, including calcified atherosclerotic plaque in the left anterior descending, left circumflex and right coronary arteries. No pathologically enlarged mediastinal or hilar lymph nodes. Small hiatal hernia with small amount of herniated fat and fluid, similar to prior examination. No axillary lymphadenopathy. Thyroid gland is again markedly heterogeneous in appearance with multiple large nodules, largest of which is in the left lobe of the gland (partially obscured by beam hardening artifact on today's study) measuring approximately 2.0 cm in diameter (unchanged when measured in a similar fashion on prior study 10/10/2014).  Lungs/Pleura: 4 mm left upper  lobe nodule (image 11 of series 4) is unchanged. Small areas of peripheral bronchiolectasis in the lateral aspect of the right lower lobe and lateral segment of the right middle lobe are both unchanged, most compatible with areas of mild post infectious or inflammatory scarring. No acute consolidative airspace disease. No pleural effusions. A few scattered subpleural nodules are similar to the prior examination. In addition, there is a new 5 mm subpleural nodule in the posterior aspect of the left lower lobe (image 29 of series 2 and 4).  Musculoskeletal/Soft Tissues: There are no aggressive appearing lytic or blastic lesions noted in the visualized portions of the skeleton.  CT ABDOMEN AND PELVIS FINDINGS  Hepatobiliary: No suspicious appearing cystic or solid hepatic lesions are identified. No intra or extrahepatic biliary ductal dilatation. Gallbladder is normal in appearance.  Pancreas: No pancreatic mass. No pancreatic ductal dilatation. No pancreatic or peripancreatic fluid or inflammatory changes.  Spleen: Unremarkable.  Adrenals/Urinary Tract: Enlargement of previously noted bilateral adrenal lesions, currently with a 3.9 x 2.2 cm right adrenal mass, and a 2.4 x 2.1 cm left adrenal nodule. Bilateral kidneys are normal in appearance. No hydroureteronephrosis. Urinary bladder is normal in appearance.  Stomach/Bowel: Normal appearance of the stomach. No pathologic dilatation of small bowel or colon. A few scattered colonic diverticulae are noted, without surrounding inflammatory changes to suggest an acute diverticulitis at this time. Normal appendix.  Vascular/Lymphatic: Atherosclerosis throughout the abdominal and pelvic vasculature, without evidence of aneurysm or dissection. New 1.2 x 1.7 cm soft tissue attenuation lesion in the left upper quadrant of the abdomen may represent an enlarged lymph node in the transverse mesocolon, or could simply  represent a peritoneal implants. Recurrent enlarged right  internal iliac lymph node which currently measures 1.4 cm in short axis (image 111 of series 2).  Reproductive: Prostate gland and seminal vesicles are unremarkable in appearance.  Other: No significant volume of ascites.  No pneumoperitoneum.  Musculoskeletal: 19 mm soft tissue nodule in the subcutaneous fat of the left lumbar region (image 83 of series 2) is unchanged, likely a sebaceous cyst. Small sclerotic lesion in the left ilium immediately lateral to the sacroiliac joint (image 110 of series 2) is unchanged. There are no other new aggressive appearing lytic or blastic lesions noted in the visualized portions of the skeleton.  IMPRESSION: 1. Today's study demonstrates progression of metastatic disease, with recurrent right internal iliac lymph node, and enlarging bilateral adrenal nodules/masses, as above. 2. New 5 mm subpleural nodule in the left lower lobe (image 29 of series 2 and 4) is nonspecific and may simply represent a subpleural lymph node. Attention on future follow-up imaging is recommended to ensure the stability or resolution of this finding. 3. Atherosclerosis, including 3 vessel coronary artery disease. Please note that although the presence of coronary artery calcium documents the presence of coronary artery disease, the severity of this disease and any potential stenosis cannot be assessed on this non-gated CT examination. Assessment for potential risk factor modification, dietary therapy or pharmacologic therapy may be warranted, if clinically indicated. 4. Additional incidental findings, as above, similar prior examinations.   Electronically Signed   By: Vinnie Langton M.D.   On: 12/27/2014 14:19   Ct Abdomen Pelvis W Contrast  12/27/2014   CLINICAL DATA:  Subsequent evaluation of a 69 year old male with history of prostate cancer with metastatic disease to the brain undergoing ongoing chemotherapy. Restaging examination.  EXAM: CT CHEST, ABDOMEN, AND PELVIS WITH CONTRAST  TECHNIQUE:  Multidetector CT imaging of the chest, abdomen and pelvis was performed following the standard protocol during bolus administration of intravenous contrast.  CONTRAST:  163mL OMNIPAQUE IOHEXOL 300 MG/ML  SOLN  COMPARISON:  CT of the chest, abdomen and pelvis 10/10/2014.  FINDINGS: CT CHEST FINDINGS  Mediastinum/Lymph Nodes: Heart size is normal. Small amount of anterior pericardial fluid and/or thickening, unlikely to be of any hemodynamic significance at this time. No associated pericardial calcification. There is atherosclerosis of the thoracic aorta, the great vessels of the mediastinum and the coronary arteries, including calcified atherosclerotic plaque in the left anterior descending, left circumflex and right coronary arteries. No pathologically enlarged mediastinal or hilar lymph nodes. Small hiatal hernia with small amount of herniated fat and fluid, similar to prior examination. No axillary lymphadenopathy. Thyroid gland is again markedly heterogeneous in appearance with multiple large nodules, largest of which is in the left lobe of the gland (partially obscured by beam hardening artifact on today's study) measuring approximately 2.0 cm in diameter (unchanged when measured in a similar fashion on prior study 10/10/2014).  Lungs/Pleura: 4 mm left upper lobe nodule (image 11 of series 4) is unchanged. Small areas of peripheral bronchiolectasis in the lateral aspect of the right lower lobe and lateral segment of the right middle lobe are both unchanged, most compatible with areas of mild post infectious or inflammatory scarring. No acute consolidative airspace disease. No pleural effusions. A few scattered subpleural nodules are similar to the prior examination. In addition, there is a new 5 mm subpleural nodule in the posterior aspect of the left lower lobe (image 29 of series 2 and 4).  Musculoskeletal/Soft Tissues: There are no aggressive appearing lytic  or blastic lesions noted in the visualized portions  of the skeleton.  CT ABDOMEN AND PELVIS FINDINGS  Hepatobiliary: No suspicious appearing cystic or solid hepatic lesions are identified. No intra or extrahepatic biliary ductal dilatation. Gallbladder is normal in appearance.  Pancreas: No pancreatic mass. No pancreatic ductal dilatation. No pancreatic or peripancreatic fluid or inflammatory changes.  Spleen: Unremarkable.  Adrenals/Urinary Tract: Enlargement of previously noted bilateral adrenal lesions, currently with a 3.9 x 2.2 cm right adrenal mass, and a 2.4 x 2.1 cm left adrenal nodule. Bilateral kidneys are normal in appearance. No hydroureteronephrosis. Urinary bladder is normal in appearance.  Stomach/Bowel: Normal appearance of the stomach. No pathologic dilatation of small bowel or colon. A few scattered colonic diverticulae are noted, without surrounding inflammatory changes to suggest an acute diverticulitis at this time. Normal appendix.  Vascular/Lymphatic: Atherosclerosis throughout the abdominal and pelvic vasculature, without evidence of aneurysm or dissection. New 1.2 x 1.7 cm soft tissue attenuation lesion in the left upper quadrant of the abdomen may represent an enlarged lymph node in the transverse mesocolon, or could simply represent a peritoneal implants. Recurrent enlarged right internal iliac lymph node which currently measures 1.4 cm in short axis (image 111 of series 2).  Reproductive: Prostate gland and seminal vesicles are unremarkable in appearance.  Other: No significant volume of ascites.  No pneumoperitoneum.  Musculoskeletal: 19 mm soft tissue nodule in the subcutaneous fat of the left lumbar region (image 83 of series 2) is unchanged, likely a sebaceous cyst. Small sclerotic lesion in the left ilium immediately lateral to the sacroiliac joint (image 110 of series 2) is unchanged. There are no other new aggressive appearing lytic or blastic lesions noted in the visualized portions of the skeleton.  IMPRESSION: 1. Today's study  demonstrates progression of metastatic disease, with recurrent right internal iliac lymph node, and enlarging bilateral adrenal nodules/masses, as above. 2. New 5 mm subpleural nodule in the left lower lobe (image 29 of series 2 and 4) is nonspecific and may simply represent a subpleural lymph node. Attention on future follow-up imaging is recommended to ensure the stability or resolution of this finding. 3. Atherosclerosis, including 3 vessel coronary artery disease. Please note that although the presence of coronary artery calcium documents the presence of coronary artery disease, the severity of this disease and any potential stenosis cannot be assessed on this non-gated CT examination. Assessment for potential risk factor modification, dietary therapy or pharmacologic therapy may be warranted, if clinically indicated. 4. Additional incidental findings, as above, similar prior examinations.   Electronically Signed   By: Vinnie Langton M.D.   On: 12/27/2014 14:19    Medications  ketorolac (TORADOL) 30 MG/ML injection 30 mg (30 mg Intravenous Given 01/26/15 0512)  dexamethasone (DECADRON) injection 10 mg (10 mg Intravenous Given 01/26/15 1916)    Wife states the main problem has been frequent falls and LLE weakness.  Inability even with walker to care for patient.  Has been too weak to be on IV chemotherapy.    Case d/w Dr. Armida Sans of neurology, transfer to [facility] for admission and further work up I personally performed the services described in this documentation, which was scribed in my presence. The recorded information has been reviewed and is accurate.    Veatrice Kells, MD 01/26/15 [phone]

## 2015-01-26 NOTE — ED Notes (Signed)
[phone], please call Ivan Stark with updates

## 2015-01-26 NOTE — Evaluation (Signed)
Clinical/Bedside Swallow Evaluation Patient Details  Name: Ivan Stark MRN: [id number] Date of Birth: 01-Aug-1945  Today's Date: 01/26/2015 Time: SLP Start Time (ACUTE ONLY): 4650 SLP Stop Time (ACUTE ONLY): 1624 SLP Time Calculation (min) (ACUTE ONLY): 19 min  Past Medical History:  Past Medical History  Diagnosis Date  . Hypertension   . Hyperlipidemia   . GERD (gastroesophageal reflux disease)   . Vitamin D deficiency   . History of colon polyps   . Complication of anesthesia     Difficult to wake up; and severe nausea and vomiting  . PONV (postoperative nausea and vomiting)   . Diabetes mellitus without complication     metformin  . Cancer 03/2014    prostate ca   Past Surgical History:  Past Surgical History  Procedure Laterality Date  . Tonsillectomy    . Colonoscopy w/ polypectomy    . Esophagus surgery      Had esophagus stretched due to food getting trapped in his throat  . Craniotomy Bilateral 04/06/2014    Procedure: Bilateral Fronto-parietal craniotomy for resection of tumors with Curve;  Surgeon: Consuella Lose, MD;  Location: [place] [facility];  Service: Neurosurgery;  Laterality: Bilateral;  Bilateral Fronto-parietal craniotomy for resection of tumors with Curve  . Colonoscopy N/A 04/19/2014    Procedure: COLONOSCOPY;  Surgeon: Winfield Cunas., MD;  Location: [facility] ENDOSCOPY;  Service: Endoscopy;  Laterality: N/A;   HPI:  69 y.o. male with Past medical history of hypertension, GERD, prostate cancer, cerebral meningioma, status post craniotomy, prior history of left leg weakness .   Assessment / Plan / Recommendation Clinical Impression   Pt with normal oropharyngeal swallow with various consistencies including thin-solids including timely swallow and oral/pharyngeal phase of swallow WFL; recommend regular/thin (carb-modified) diet with general swallowing precautions including slow rate/small bites/sips and upright 90 degrees during meals/snacks.  SLE pending  dated 01/27/15.    Aspiration Risk  None    Diet Recommendation Thin;Other (Comment) (Regular (carb-modified))   Medication Administration: Whole meds with liquid Compensations: Slow rate;Small sips/bites    Other  Recommendations Oral Care Recommendations: Oral care BID   Follow Up Recommendations    n/a   Frequency and Duration   n/a     Pertinent Vitals/Pain WDL    SLP Swallow Goals  n/a   Swallow Study Prior Functional Status   Independent at home    General Date of Onset: 01/26/15 Other Pertinent Information: 69 y.o. male with Past medical history of hypertension, GERD, prostate cancer, cerebral meningioma, status post craniotomy, prior history of left leg weakness . Type of Study: Bedside swallow evaluation Diet Prior to this Study: NPO Temperature Spikes Noted: No Respiratory Status: Room air History of Recent Intubation: No Behavior/Cognition: Alert;Cooperative Oral Cavity - Dentition: Adequate natural dentition/normal for age Self-Feeding Abilities: Able to feed self Patient Positioning: Upright in bed Baseline Vocal Quality: Normal Volitional Cough: Strong Volitional Swallow: Able to elicit    Oral/Motor/Sensory Function Overall Oral Motor/Sensory Function: Appears within functional limits for tasks assessed   Ice Chips Ice chips: Not tested   Thin Liquid Thin Liquid: Within functional limits Presentation: Cup;Straw    Nectar Thick Nectar Thick Liquid: Not tested   Honey Thick Honey Thick Liquid: Not tested   Puree Puree: Within functional limits Presentation: Spoon   Solid    Functional Assessment Tool Used: NOMS Functional Limitations: Swallowing Swallow Current Status (P5465): 0 percent impaired, limited or restricted Swallow Goal Status (K8127): 0 percent impaired, limited or restricted Swallow  Discharge Status [phone]): 0 percent impaired, limited or restricted  Solid: Within functional limits Presentation: Self Fed       ADAMS,PAT, M.S.,  CCC-SLP 01/26/2015,4:30 PM

## 2015-01-26 NOTE — ED Notes (Signed)
rn ajsa calling report on patient and will let me know what she needs lab wise in a minute

## 2015-01-26 NOTE — H&P (Signed)
[facility] History and Physical  Patient: Ivan Stark  MRN: [id number]  DOB: [birth year]  DOS: the patient was seen and examined on 01/26/2015 PCP: Alesia Richards, MD  Referring physician: Dr. Randal Buba Chief Complaint: Headache  HPI: Ivan Stark is a 69 y.o. male with Past medical history of hypertension, GERD, prostate cancer, cerebral meningioma, status post craniotomy, prior history of left leg weakness . The patient was brought in with the complaints also severe headache. As per the family the patient does not have any headache and his baseline. Even with his prior CNS surgery he did not have any significant pain. Today suddenly he woke up from sleep with severe pain across his head which was a continuous and sharp. The head ache resolved at the time of my evaluation. The patient did not have any blurry visions. Difficulty. Patient has chronic left-sided weakness. Patient also has chronic gait imbalance. As per patient's wife since last one week and his gait imbalance as well as left-sided weakness has been progressively worsening and the patient has multiple fall. Generally the patient has one fall a week but patient has been having 14 every day since last one week. The patient has recently completed a chemotherapy regimen for his prostate cancer. Patient denies having any nausea vomiting denies having any cough or fever or chills no diarrhea or constipation or active bleeding. Other than the new chemotherapy patient does not have any new changes in his medication.  The patient is coming from home.  At his baseline ambulates with walker And is independent for most of his ADL manages his medication on his own.  Review of Systems: as mentioned in the history of present illness.  A comprehensive review of the other systems is negative.  Past Medical History  Diagnosis Date  . Hypertension   . Hyperlipidemia   . GERD (gastroesophageal reflux disease)   . Vitamin  D deficiency   . History of colon polyps   . Complication of anesthesia     Difficult to wake up; and severe nausea and vomiting  . PONV (postoperative nausea and vomiting)   . Diabetes mellitus without complication     metformin  . Cancer 03/2014    prostate ca   Past Surgical History  Procedure Laterality Date  . Tonsillectomy    . Colonoscopy w/ polypectomy    . Esophagus surgery      Had esophagus stretched due to food getting trapped in his throat  . Craniotomy Bilateral 04/06/2014    Procedure: Bilateral Fronto-parietal craniotomy for resection of tumors with Curve;  Surgeon: Consuella Lose, MD;  Location: [place] [facility];  Service: Neurosurgery;  Laterality: Bilateral;  Bilateral Fronto-parietal craniotomy for resection of tumors with Curve  . Colonoscopy N/A 04/19/2014    Procedure: COLONOSCOPY;  Surgeon: Winfield Cunas., MD;  Location: [facility] END[facility];  Service: Endoscopy;  Laterality: N/A;   Social History:  reports that he has never smoked. He does not have any smokeless tobacco history on file. He reports that he does not drink alcohol or use illicit drugs.  Allergies  Allergen Reactions  . Atenolol Shortness Of Breath  . Lipitor [Atorvastatin]     Itch    Family History  Problem Relation Age of Onset  . Goiter Mother   . Hypertension Father   . Cancer Father     colon    Prior to Admission medications   Medication Sig Start Date End Date Taking? Authorizing Provider  Ascorbic Acid (VITAMIN  C) 1000 MG tablet Take 1,000 mg by mouth daily.   Yes Historical Provider, MD  aspirin EC 325 MG EC tablet Take 1 tablet (325 mg total) by mouth daily. 09/26/14  Yes Shanker Kristeen Mans, MD  Cholecalciferol (VITAMIN D) 2000 UNITS CAPS Take 4,000 Units by mouth daily.    Yes Historical Provider, MD  glimepiride (AMARYL) 4 MG tablet Take 1/2 to 1 tablet BID or as directed. Patient taking differently: Take 2 mg by mouth 2 (two) times daily.  01/02/15  Yes Unk Pinto, MD    GLUCERNA [facility]) LIQD Take 237 mLs by mouth 2 (two) times daily between meals.    Yes Historical Provider, MD  Lactulose 20 GM/30ML SOLN Take 15 mLs (10 g total) by mouth 2 (two) times daily. 01/16/15  Yes Owens Shark, NP  metFORMIN (GLUCOPHAGE-XR) 500 MG 24 hr tablet TAKE ONE TABLET BY MOUTH TWICE DAILY WITH BREAKFAST AND LUNCH 01/01/15  Yes Unk Pinto, MD  Multiple Vitamin (MULTIVITAMIN WITH MINERALS) TABS Take 1 tablet by mouth daily.   Yes Historical Provider, MD  ondansetron (ZOFRAN) 8 MG tablet Take 1 tablet (8 mg total) by mouth daily. Take prior to Temodar 12/31/14  Yes Ladell Pier, MD  polyethylene glycol [facility] / GLYCOLAX) packet Take 17 g by mouth daily.   Yes Historical Provider, MD  senna (SENOKOT) 8.6 MG TABS tablet Take 2 tablets (17.2 mg total) by mouth 2 (two) times daily. For constipation. Adjust as  needed Patient taking differently: Take 1 tablet by mouth every evening. For constipation. Adjust as  needed 04/27/14  Yes Ivan Anchors Love, PA-C  temozolomide (TEMODAR) 140 MG capsule Take 2 tablets (=280mg ) daily for 5 days.  Start 01/07/15  May take on an empty stomach or at bedtime to decrease nausea & vomiting. 01/03/15  Yes Ladell Pier, MD  ALPRAZolam Duanne Moron) 0.5 MG tablet Take 1 tablet (0.5 mg total) by mouth 2 (two) times daily as needed for anxiety. 09/21/14   Owens Shark, NP    Physical Exam: Filed Vitals:   01/26/15 0321 01/26/15 0650  BP: 141/86 133/81  Pulse: 81 70  Temp: 97.9 F (36.6 C) 98 F (36.7 C)  TempSrc: Oral Oral  Resp: 20 20  Height: 6\' 4"  (1.93 m)   Weight: 79.379 kg (175 lb)   SpO2: 99% 98%    General: Alert, Awake and Oriented to Time, Place and Person. Appear in moderate distress Eyes: PERRL ENT: Oral Mucosa clear moist. Neck: no JVD Cardiovascular: S1 and S2 Present, no Murmur, Peripheral Pulses Present Respiratory: Bilateral Air entry equal and Decreased,  Clear to Auscultation, no Crackles, no wheezes Abdomen: Bowel Sound  present, Soft and no tenderness Skin: no Rash Extremities: no Pedal edema, no calf tenderness Neurologic: Mental status AAOx3, speech normal, attention normal,  Cranial Nerves PERRL, EOM normal and present, facial sensation to light touch present,  Motor strength bilateral equal strength 5/5, in upper extremity. On lower extremity motor strength is 3 x 5 on left 5 out of 5 on right Sensation present to light touch,  Reflexes present knee and biceps, babinski negative,  Proprioception is limited bilaterally Cerebellar test abnormal finger nose finger on left.  Labs on Admission:  CBC:  Recent Labs Lab 01/26/15 0331 01/26/15 0335  WBC 5.5  --   NEUTROABS 3.8  --   HGB 13.5 14.6  HCT 41.7 43.0  MCV 89.7  --   PLT 230  --     CMP  Component Value Date/Time   NA 140 01/26/2015 0335   NA 144 12/27/2014 1204   K 3.8 01/26/2015 0335   K 3.9 12/27/2014 1204   CL 107 01/26/2015 0335   CO2 30* 12/27/2014 1204   CO2 22 09/25/2014 1840   GLUCOSE 137* 01/26/2015 0335   GLUCOSE 101 12/27/2014 1204   BUN 15 01/26/2015 0335   BUN 12.6 12/27/2014 1204   CREATININE 0.90 01/26/2015 0335   CREATININE 0.9 12/27/2014 1204   CREATININE 0.81 09/05/2014 1232   CALCIUM 9.5 12/27/2014 1204   CALCIUM 8.7 09/25/2014 1840   PROT 6.5 12/27/2014 1204   PROT 5.8* 09/25/2014 1840   ALBUMIN 3.5 12/27/2014 1204   ALBUMIN 3.1* 09/25/2014 1840   AST 30 12/27/2014 1204   AST 21 09/25/2014 1840   ALT 48 12/27/2014 1204   ALT 13 09/25/2014 1840   ALKPHOS 85 12/27/2014 1204   ALKPHOS 64 09/25/2014 1840   BILITOT 0.56 12/27/2014 1204   BILITOT 0.7 09/25/2014 1840   GFRNONAA 75* 09/25/2014 1840   GFRNONAA >89 09/05/2014 1232   GFRAA 87* 09/25/2014 1840   GFRAA >89 09/05/2014 1232    No results for input(s): LIPASE, AMYLASE in the last 168 hours.  No results for input(s): CKTOTAL, CKMB, CKMBINDEX, TROPONINI in the last 168 hours. BNP (last 3 results) No results for input(s): BNP in the last  8760 hours.  ProBNP (last 3 results) No results for input(s): PROBNP in the last 8760 hours.   Radiological Exams on Admission: Dg Chest 2 View  01/26/2015   CLINICAL DATA:  Sudden onset headache.  Weakness and cancer.  EXAM: CHEST  2 VIEW  COMPARISON:  Chest CT 12/27/2014  FINDINGS: Normal heart size and stable mediastinal contours, including lower mediastinal widening from hiatal hernia. There is no edema, consolidation, effusion, or pneumothorax.  IMPRESSION: No active cardiopulmonary disease.   Electronically Signed   By: Monte Fantasia M.D.   On: 01/26/2015 05:36   Ct Head Wo Contrast  01/26/2015   CLINICAL DATA:  Headache and history of brain cancer. Metastatic prostate cancer and history of left frontal meningioma.  EXAM: CT HEAD WITHOUT CONTRAST  TECHNIQUE: Contiguous axial images were obtained from the base of the skull through the vertex without intravenous contrast.  COMPARISON:  09/25/2014  FINDINGS: Skull and Sinuses:Stable appearance of craniotomy at the vertex.  Interval decrease in right mastoid effusion, now layering in the mastoid tip. Small effusion also present in the left mastoid tip.  Orbits: No acute abnormality.  Brain: No evidence of acute infarction, hemorrhage, hydrocephalus, or mass lesion/mass effect.  Stable parasagittal posterior bifrontal gliosis at resected masses. A 7 mm fallcine mass noted on brain MRI April 2016 is not clearly visible. No evidence of new intracranial metastatic disease.  Chronic small vessel disease with ischemic gliosis confluent throughout the bilateral cerebral white matter. Generalized cortical atrophy.  IMPRESSION: 1. No acute intracranial finding. 2. Chronic small vessel disease. 3. Stable post treatment changes at the vertex related to meningioma and metastasis resections. 4. Bilateral mastoid effusions, decreased on the right since April 2016.   Electronically Signed   By: Monte Fantasia M.D.   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 TIA (transient ischemic attack) The patient is presenting  with complaints of severe headache. CT scan of the head is not showing any acute intracranial abnormality. The patient does have left-sided weakness which is chronic. As per the family the left-sided weakness is progressively worsening as well as gait imbalance. The patient has multiple fall. The patient was discussed with neurology by ER physician and the neurology has recommended the patient to be transferred to [facility] cone for further workup. Patient will be admitted in [facility] on neurology floor. I would check MRI of his lumbar spine along with the MRI brain as well. Further workup depending on neurology recommendation. PTOT and speech therapy.  2. Generalized weakness and radiation. The patient has been having multiple fall and has been becoming difficult to be managed by family member. We will discuss with social worker in the morning.  3. Prostate cancer as well as cerebral meningioma. Patient's multiple complaints can be side effects of the recent chemotherapy regimen with Temodar. We will continue to closely monitor.  4. History of diabetes mellitus. Holding oral hypoglycemic agent and the placing the patient on sliding scale.  Advance goals of care discussion: Full code   Consults: Neurology  DVT Prophylaxis: subcutaneous Heparin Nutrition: Nothing by mouth pending stroke evaluation  Family Communication: family was present at bedside, opportunity was given to ask question and all questions were answered satisfactorily at the time of interview. Disposition: Admitted as observation, Telemetry unit. [facility]  Author: Berle Mull, MD [facility] Pager: [phone] 01/26/2015  If 7PM-7AM, please contact night-coverage [email or web address] Password [email or web address]

## 2015-01-26 NOTE — Consult Note (Signed)
Reason for Consult:Headache, progressive decrease in function Referring Physician: Posey Pronto  CC: Headache, progressive decrease in function  HPI: Ivan Stark is an 69 y.o. male s/p craniotomy for brain tumor in October of last year.  Most of the history is provided by the wife.  It appears that over the past few months the patient has been progressively declining.  After the surgery he was able to get around with a walker.  He has now progressed with weakness to the point that he is falling multiple times a day.  He does not report any sensory loss.  He has been losing weight despite a good appetite and has lost a lot of muscle mass with this weight loss as well.  Patient feels weakness on the left lower extremity but did have a residual left lower extremity weakness after his surgery.  Was initially in therapy but this was discontinued because he was not progressing.   Ppresented to the ED because he awakened with severe headache.  Describes the headache as being on the top of his head.  It lasted for a couple of hours until headache cocktail given in the ED.  He reports some intermittent pain today but it is not severe.  Patient with no previous history of headache.    Past Medical History  Diagnosis Date  . Hypertension   . Hyperlipidemia   . GERD (gastroesophageal reflux disease)   . Vitamin D deficiency   . History of colon polyps   . Complication of anesthesia     Difficult to wake up; and severe nausea and vomiting  . PONV (postoperative nausea and vomiting)   . Diabetes mellitus without complication     metformin  . Cancer 03/2014    prostate ca    Past Surgical History  Procedure Laterality Date  . Tonsillectomy    . Colonoscopy w/ polypectomy    . Esophagus surgery      Had esophagus stretched due to food getting trapped in his throat  . Craniotomy Bilateral 04/06/2014    Procedure: Bilateral Fronto-parietal craniotomy for resection of tumors with Curve;  Surgeon: Consuella Lose, MD;  Location: [facility];  Service: Neurosurgery;  Laterality: Bilateral;  Bilateral Fronto-parietal craniotomy for resection of tumors with Curve  . Colonoscopy N/A 04/19/2014    Procedure: COLONOSCOPY;  Surgeon: Winfield Cunas., MD;  Location: [facility];  Service: Endoscopy;  Laterality: N/A;    Family History  Problem Relation Age of Onset  . Goiter Mother   . Hypertension Father   . Cancer Father     colon    Social History:  reports that he has never smoked. He does not have any smokeless tobacco history on file. He reports that he does not drink alcohol or use illicit drugs.  Allergies  Allergen Reactions  . Atenolol Shortness Of Breath  . Lipitor [Atorvastatin]     Itch    Medications:  I have reviewed the patient's current medications. Prior to Admission:  Prescriptions prior to admission  Medication Sig Dispense Refill Last Dose  . Ascorbic Acid (VITAMIN C) 1000 MG tablet Take 1,000 mg by mouth daily.   01/25/2015 at Unknown time  . aspirin EC 325 MG EC tablet Take 1 tablet (325 mg total) by mouth daily. 30 tablet 0 01/25/2015 at Unknown time  . Cholecalciferol (VITAMIN D) 2000 UNITS CAPS Take 4,000 Units by mouth daily.    01/25/2015 at Unknown time  . glimepiride (AMARYL) 4 MG tablet  Take 1/2 to 1 tablet BID or as directed. (Patient taking differently: Take 2 mg by mouth 2 (two) times daily. ) 60 tablet 1 01/25/2015 at Unknown time  . GLUCERNA (GLUCERNA) LIQD Take 237 mLs by mouth 2 (two) times daily between meals.    01/25/2015 at Unknown time  . Lactulose 20 GM/30ML SOLN Take 15 mLs (10 g total) by mouth 2 (two) times daily. 946 mL 0 Past Week at Unknown time  . metFORMIN (GLUCOPHAGE-XR) 500 MG 24 hr tablet TAKE ONE TABLET BY MOUTH TWICE DAILY WITH BREAKFAST AND LUNCH 60 tablet 0 01/25/2015 at Unknown time  . Multiple Vitamin (MULTIVITAMIN WITH MINERALS) TABS Take 1 tablet by mouth daily.   01/25/2015 at Unknown time  . ondansetron (ZOFRAN) 8 MG tablet  Take 1 tablet (8 mg total) by mouth daily. Take prior to Temodar 20 tablet 0 Past Month at Unknown time  . polyethylene glycol (MIRALAX / GLYCOLAX) packet Take 17 g by mouth daily.   Past Month at Unknown time  . senna (SENOKOT) 8.6 MG TABS tablet Take 2 tablets (17.2 mg total) by mouth 2 (two) times daily. For constipation. Adjust as  needed (Patient taking differently: Take 1 tablet by mouth every evening. For constipation. Adjust as  needed) 120 each 0 Past Month at Unknown time  . temozolomide (TEMODAR) 140 MG capsule Take 2 tablets (=280mg ) daily for 5 days.  Start 01/07/15  May take on an empty stomach or at bedtime to decrease nausea & vomiting. 10 capsule 0 01/13/2015 at Unknown time  . ALPRAZolam (XANAX) 0.5 MG tablet Take 1 tablet (0.5 mg total) by mouth 2 (two) times daily as needed for anxiety. 30 tablet 0 PRN   Scheduled: . aspirin  300 mg Rectal Daily   Or  . aspirin  325 mg Oral Daily  . enoxaparin (LOVENOX) injection  40 mg Subcutaneous Q24H  . insulin aspart  0-15 Units Subcutaneous TID WC  . insulin aspart  0-5 Units Subcutaneous QHS  . senna  2 tablet Oral BID    ROS: History obtained from the patient  General ROS: as noted in HPI Psychological ROS: memory loss Ophthalmic ROS: negative for - blurry vision, double vision, eye pain or loss of vision ENT ROS: negative for - epistaxis, nasal discharge, oral lesions, sore throat, tinnitus or vertigo Allergy and Immunology ROS: negative for - hives or itchy/watery eyes Hematological and Lymphatic ROS: negative for - bleeding problems, bruising or swollen lymph nodes Endocrine ROS: negative for - galactorrhea, hair pattern changes, polydipsia/polyuria or temperature intolerance Respiratory ROS: negative for - cough, hemoptysis, shortness of breath or wheezing Cardiovascular ROS: negative for - chest pain, dyspnea on exertion, edema or irregular heartbeat Gastrointestinal ROS: negative for - abdominal pain, diarrhea, hematemesis,  nausea/vomiting or stool incontinence Genito-Urinary ROS: negative for - dysuria, hematuria, incontinence or urinary frequency/urgency Musculoskeletal ROS: as noted in HPI Neurological ROS: as noted in HPI Dermatological ROS: negative for rash and skin lesion changes  Physical Examination: Blood pressure 123/77, pulse 67, temperature 98.4 F (36.9 C), temperature source Oral, resp. rate 18, height 6\' 4"  (1.93 m), weight 79.379 kg (175 lb), SpO2 99 %.  HEENT-  Normocephalic, no lesions, without obvious abnormality.  Normal external eye and conjunctiva.  Normal TM's bilaterally.  Normal auditory canals and external ears. Normal external nose, mucus membranes and septum.  Normal pharynx. Cardiovascular- S1, S2 normal, pulses palpable throughout   Lungs- chest clear, no wheezing, rales, normal symmetric air entry Abdomen- soft, non-tender;  bowel sounds normal; no masses,  no organomegaly Extremities- no edema Lymph-no adenopathy palpable Musculoskeletal-no joint tenderness, deformity or swelling Skin-warm and dry, no hyperpigmentation, vitiligo, or suspicious lesions  Neurological Examination Mental Status: Alert, memory loss noted.  Speech fluent but some word finding difficulties noted-unclear if related to cognition.  Able to follow 3 step commands with some reinforcement. Cranial Nerves: II: Discs flat bilaterally; Visual fields grossly normal, pupils equal, round, reactive to light and accommodation III,IV, VI: ptosis not present, extra-ocular motions intact bilaterally V,VII: smile symmetric, facial light touch sensation normal bilaterally VIII: hearing normal bilaterally IX,X: gag reflex present XI: bilateral shoulder shrug XII: midline tongue extension Motor: Right : Upper extremity   5/5    Left:     Upper extremity   5/5  Lower extremity   4+/5    Lower extremity   2-3/5 Tone and bulk:normal tone throughout; no atrophy noted Sensory: Pinprick and light touch intact throughout,  bilaterally Deep Tendon Reflexes: 2+ in the upper extremities, 3+ left KJ, 2+ left AJ, 2+ right KJ, absent right AJ Plantars: Right: downgoing   Left: downgoing Cerebellar: normal finger-to-nose testing bilaterally, normal heel to shin on the right.  Unable to perform on the left due to weakness Gait: not tested due to safety concnerns   Laboratory Studies:   Basic Metabolic Panel:  Recent Labs Lab 01/26/15 0335 01/26/15 0832  NA 140 138  K 3.8 4.1  CL 107 109  CO2  --  24  GLUCOSE 137* 179*  BUN 15 16  CREATININE 0.90 0.79  CALCIUM  --  9.0    Liver Function Tests:  Recent Labs Lab 01/26/15 0832  AST 20  ALT 19  ALKPHOS 115  BILITOT 0.8  PROT 6.4*  ALBUMIN 3.3*   No results for input(s): LIPASE, AMYLASE in the last 168 hours. No results for input(s): AMMONIA in the last 168 hours.  CBC:  Recent Labs Lab 01/26/15 0331 01/26/15 0335  WBC 5.5  --   NEUTROABS 3.8  --   HGB 13.5 14.6  HCT 41.7 43.0  MCV 89.7  --   PLT 230  --     Cardiac Enzymes: No results for input(s): CKTOTAL, CKMB, CKMBINDEX, TROPONINI in the last 168 hours.  BNP: Invalid input(s): POCBNP  CBG:  Recent Labs Lab 01/26/15 0939  GLUCAP 173*    Microbiology: Results for orders placed or performed in visit on 05/29/14  Urine culture     Status: None   Collection Time: 05/29/14 11:02 AM  Result Value Ref Range Status   Colony Count 6,000 COLONIES/ML  Final   Organism ID, Bacteria Insignificant Growth  Final    Coagulation Studies:  Recent Labs  01/26/15 0832  LABPROT 12.9  INR 0.95    Urinalysis: No results for input(s): COLORURINE, LABSPEC, PHURINE, GLUCOSEU, HGBUR, BILIRUBINUR, KETONESUR, PROTEINUR, UROBILINOGEN, NITRITE, LEUKOCYTESUR in the last 168 hours.  Invalid input(s): APPERANCEUR  Lipid Panel:     Component Value Date/Time   CHOL 182 09/26/2014 0417   TRIG 114 09/26/2014 0417   HDL 30* 09/26/2014 0417   CHOLHDL 6.1 09/26/2014 0417   VLDL 23  09/26/2014 0417   LDLCALC 129* 09/26/2014 0417    HgbA1C:  Lab Results  Component Value Date   HGBA1C 8.4* 09/26/2014    Urine Drug Screen:     Component Value Date/Time   LABOPIA NONE DETECTED 09/26/2014 0239   COCAINSCRNUR NONE DETECTED 09/26/2014 0239   LABBENZ NONE DETECTED 09/26/2014 0239  AMPHETMU NONE DETECTED 09/26/2014 0239   THCU NONE DETECTED 09/26/2014 0239   LABBARB NONE DETECTED 09/26/2014 0239    Alcohol Level: No results for input(s): ETH in the last 168 hours.  Other results: EKG: sinus rhythm at 64 bpm.  Imaging: Dg Chest 2 View  01/26/2015   CLINICAL DATA:  Sudden onset headache.  Weakness and cancer.  EXAM: CHEST  2 VIEW  COMPARISON:  Chest CT 12/27/2014  FINDINGS: Normal heart size and stable mediastinal contours, including lower mediastinal widening from hiatal hernia. There is no edema, consolidation, effusion, or pneumothorax.  IMPRESSION: No active cardiopulmonary disease.   Electronically Signed   By: Monte Fantasia M.D.   On: 01/26/2015 05:36   Ct Head Wo Contrast  01/26/2015   CLINICAL DATA:  Headache and history of brain cancer. Metastatic prostate cancer and history of left frontal meningioma.  EXAM: CT HEAD WITHOUT CONTRAST  TECHNIQUE: Contiguous axial images were obtained from the base of the skull through the vertex without intravenous contrast.  COMPARISON:  09/25/2014  FINDINGS: Skull and Sinuses:Stable appearance of craniotomy at the vertex.  Interval decrease in right mastoid effusion, now layering in the mastoid tip. Small effusion also present in the left mastoid tip.  Orbits: No acute abnormality.  Brain: No evidence of acute infarction, hemorrhage, hydrocephalus, or mass lesion/mass effect.  Stable parasagittal posterior bifrontal gliosis at resected masses. A 7 mm fallcine mass noted on brain MRI April 2016 is not clearly visible. No evidence of new intracranial metastatic disease.  Chronic small vessel disease with ischemic gliosis confluent  throughout the bilateral cerebral white matter. Generalized cortical atrophy.  IMPRESSION: 1. No acute intracranial finding. 2. Chronic small vessel disease. 3. Stable post treatment changes at the vertex related to meningioma and metastasis resections. 4. Bilateral mastoid effusions, decreased on the right since April 2016.   Electronically Signed   By: Monte Fantasia M.D.   On: 01/26/2015 04:16   Mr Brain Wo Contrast  01/26/2015   CLINICAL DATA:  Headache. Metastatic prostate cancer. Left frontal meningioma.  EXAM: MRI HEAD WITHOUT CONTRAST  MRA HEAD WITHOUT CONTRAST  TECHNIQUE: Multiplanar, multiecho pulse sequences of the brain and surrounding structures were obtained without intravenous contrast. Angiographic images of the head were obtained using MRA technique without contrast.  COMPARISON:  Head CT same day.  MRI 09/18/2014.  FINDINGS: MRI HEAD FINDINGS  Diffusion imaging shows a punctate focus of restricted diffusion in the left frontal white matter consistent with a small white matter infarction. No mass effect or hemorrhage.  The brainstem and cerebellum are normal. The cerebral hemispheres again show atrophy with chronic small-vessel ischemic changes throughout the white matter. Focal encephalomalacia and gliosis is present in the medial posterior frontal region bilaterally related to the sites of previous meningioma resections. No evidence of residual or recurrent tumor at this site of the resections. Third small meningioma along the falx projecting more towards the right has enlarged, measuring 10 mm in diameter as opposed 8 mm previously. No evidence of hydrocephalus or extra-axial collection. Flow persists in the sagittal sinus. Fluid again noted in the mastoid air cells bilaterally.  MRA HEAD FINDINGS  Both internal carotid arteries are widely patent into the brain. The anterior and middle cerebral vessels are normal without proximal stenosis, aneurysm or vascular malformation. Both vertebral  arteries are widely patent to the basilar. No basilar stenosis. Posterior circulation branch vessels appear normal.  IMPRESSION: Punctate acute white matter infarction in the left frontal lobe. No swelling or  mass effect.  No evidence of residual or recurrent tumor at this site of meningioma resection bilaterally along the falx.  Interval growth of a third falcine meningioma, increased in size from 8 mm 10 mm over the last 4 months.  Normal intracranial MR angiography of the large and medium size vessels appear   Electronically Signed   By: Nelson Chimes M.D.   On: 01/26/2015 12:42   Mr Lumbar Spine Wo Contrast  01/26/2015   CLINICAL DATA:  Left sided leg weakness with progressively worsening with gait disturbance. Multiple falls.  EXAM: MRI LUMBAR SPINE WITHOUT CONTRAST  TECHNIQUE: Multiplanar, multisequence MR imaging of the lumbar spine was performed. No intravenous contrast was administered.  COMPARISON:  None.  FINDINGS: There is no abnormality at L3-4 or above. The discs are normal. The canal and foramina are widely patent. The distal cord and conus are normal with the conus tip at L1.  L4-5: Mild desiccation and bulging of the disc. Mild facet hypertrophy. No canal or foraminal stenosis.  L5-S1: Mild bulging of the disc. Mild facet hypertrophy. No canal or foraminal stenosis.  IMPRESSION: No significant finding. Mild degenerative disc disease and degenerative facet disease at L4-5 and L5-S1. The findings could be associated with back pain, but there is no stenosis or visible neural compression.   Electronically Signed   By: Nelson Chimes M.D.   On: 01/26/2015 13:33   Mr Jodene Nam Head/brain Wo Cm  01/26/2015   CLINICAL DATA:  Headache. Metastatic prostate cancer. Left frontal meningioma.  EXAM: MRI HEAD WITHOUT CONTRAST  MRA HEAD WITHOUT CONTRAST  TECHNIQUE: Multiplanar, multiecho pulse sequences of the brain and surrounding structures were obtained without intravenous contrast. Angiographic images of the head  were obtained using MRA technique without contrast.  COMPARISON:  Head CT same day.  MRI 09/18/2014.  FINDINGS: MRI HEAD FINDINGS  Diffusion imaging shows a punctate focus of restricted diffusion in the left frontal white matter consistent with a small white matter infarction. No mass effect or hemorrhage.  The brainstem and cerebellum are normal. The cerebral hemispheres again show atrophy with chronic small-vessel ischemic changes throughout the white matter. Focal encephalomalacia and gliosis is present in the medial posterior frontal region bilaterally related to the sites of previous meningioma resections. No evidence of residual or recurrent tumor at this site of the resections. Third small meningioma along the falx projecting more towards the right has enlarged, measuring 10 mm in diameter as opposed 8 mm previously. No evidence of hydrocephalus or extra-axial collection. Flow persists in the sagittal sinus. Fluid again noted in the mastoid air cells bilaterally.  MRA HEAD FINDINGS  Both internal carotid arteries are widely patent into the brain. The anterior and middle cerebral vessels are normal without proximal stenosis, aneurysm or vascular malformation. Both vertebral arteries are widely patent to the basilar. No basilar stenosis. Posterior circulation branch vessels appear normal.  IMPRESSION: Punctate acute white matter infarction in the left frontal lobe. No swelling or mass effect.  No evidence of residual or recurrent tumor at this site of meningioma resection bilaterally along the falx.  Interval growth of a third falcine meningioma, increased in size from 8 mm 10 mm over the last 4 months.  Normal intracranial MR angiography of the large and medium size vessels appear   Electronically Signed   By: Nelson Chimes M.D.   On: 01/26/2015 12:42     Assessment/Plan: 69 year old male s/p craniotomy for brain tumor who presents with headache and decreased function  over the past few months.  Patient now  falling multiple times a day due to weakness.  Work up has included MRI of the brain that has been reviewed .  MRI personally reviewed and shows an enlarged falcine meningioma now at 59mm from 28mm and more rightward.  Also present is a punctate acute left frontal lobe infarct.  Left frontal lobe infarct is not the cause of the progressive LLE weakness but may explain why the right leg is weak as well.  Although the enlarging meningioma is rightward, do not suspect that 66mm of growth would have caused this degree of debilitation without significant edema.  MRI of the lumbar spine performed as well and shows no cord compromise.  No upper extremity complaints noted.   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  Last imaging in July shows progression of disease that is widely metastatic.  Patient with quite a bit of weight and muscle mass loss.  Can not rule out decreased function related to debilitation from such.    Recommendations: 1.  MRI of the thoracic spine 2.  Oncology to follow up with patient  3. 1. HgbA1c, fasting lipid panel 4. PT consult, OT consult, Speech consult 4. Echocardiogram 5. Carotid dopplers 6. Prophylactic therapy-Continue ASA 7. NPO until RN stroke swallow screen 8. Telemetry monitoring 9. Frequent neuro checks  Alexis Goodell, MD [facility] [phone] 01/26/2015, 2:24 PM

## 2015-01-26 NOTE — ED Notes (Signed)
Pt transported from home with c/o sudden onset HA @ 2100, per wife pt finished oral chemo 5 days ago since that time, increased weakness with increased aphasia. #18 L AC

## 2015-01-26 NOTE — Progress Notes (Signed)
   01/26/15 1032  Vitals  Temp 97.8 F (36.6 C)  Temp Source Oral  BP 119/78 mmHg  BP Location Right Arm  BP Method Automatic  Patient Position (if appropriate) Sitting  Pulse Rate 67  Pulse Rate Source Monitor  Resp 20  Oxygen Therapy  SpO2 100 %  O2 Device Room Air  Pain Assessment  Pain Assessment 0-10  Pain Score 2  Pain Type Acute pain  Pain Location Head  Pain Orientation Medial;Mid (parietal midline)  Pain Descriptors / Indicators Headache  Pain Onset On-going  Patients Stated Pain Goal 0  Pain Intervention(s) RN made aware  Pt arrived to 5M14 at 1032.  Pt A&O x 4, c/o 2/10 superior parietal headache.  Pt V/S taken, pt without distress. Pt failed SSS in ED r/t Hx of dysphagia, SLP swallow eval ordered.  Pt updated about plan of care.  Will monitor.

## 2015-01-27 ENCOUNTER — Other Ambulatory Visit (HOSPITAL_COMMUNITY): Payer: Medicare Other

## 2015-01-27 ENCOUNTER — Encounter (HOSPITAL_COMMUNITY): Payer: Medicare Other

## 2015-01-27 ENCOUNTER — Observation Stay (HOSPITAL_COMMUNITY): Payer: Medicare Other

## 2015-01-27 DIAGNOSIS — I6350 Cerebral infarction due to unspecified occlusion or stenosis of unspecified cerebral artery: Secondary | ICD-10-CM | POA: Diagnosis not present

## 2015-01-27 DIAGNOSIS — R29898 Other symptoms and signs involving the musculoskeletal system: Secondary | ICD-10-CM | POA: Diagnosis not present

## 2015-01-27 DIAGNOSIS — D320 Benign neoplasm of cerebral meninges: Secondary | ICD-10-CM

## 2015-01-27 DIAGNOSIS — C61 Malignant neoplasm of prostate: Secondary | ICD-10-CM | POA: Diagnosis not present

## 2015-01-27 DIAGNOSIS — E1140 Type 2 diabetes mellitus with diabetic neuropathy, unspecified: Secondary | ICD-10-CM | POA: Diagnosis not present

## 2015-01-27 LAB — LIPID PANEL
CHOLESTEROL: 207 mg/dL — AB (ref 0–200)
HDL: 33 mg/dL — AB (ref >40)
LDL CALC: 155 mg/dL — AB (ref 0–99)
TRIGLYCERIDES: 97 mg/dL (ref <150)
Total CHOL/HDL Ratio: 6.3 RATIO
VLDL: 19 mg/dL (ref 0–40)

## 2015-01-27 LAB — GLUCOSE, CAPILLARY
GLUCOSE-CAPILLARY: 123 mg/dL — AB (ref 65–99)
GLUCOSE-CAPILLARY: 208 mg/dL — AB (ref 65–99)
Glucose-Capillary: 223 mg/dL — ABNORMAL HIGH (ref 65–99)

## 2015-01-27 MED ORDER — LACTULOSE 10 GM/15ML PO SOLN
10.0000 g | Freq: Two times a day (BID) | ORAL | Status: DC
  Administered 2015-01-27 – 2015-01-29 (×5): 10 g via ORAL
  Filled 2015-01-27 (×5): qty 15

## 2015-01-27 MED ORDER — LACTULOSE 20 GM/30ML PO SOLN: 15.0000 mL | Freq: Two times a day (BID) | ORAL | Status: DC

## 2015-01-27 MED ORDER — POLYETHYLENE GLYCOL 3350 17 G PO PACK
17.0000 g | PACK | Freq: Every day | ORAL | Status: DC
  Administered 2015-01-28 – 2015-01-29 (×2): 17 g via ORAL
  Filled 2015-01-27 (×2): qty 1

## 2015-01-27 NOTE — Progress Notes (Signed)
No family present this morning during PT eval. Spoke with MD regarding d/c plan. Spouse is unable to provide 24-hour min assist to pt. Recommend SNF for further therapy intervention. D/C plan updated.  Lorrin Goodell, PT  Office # [phone] Pager [phone]

## 2015-01-27 NOTE — Evaluation (Signed)
Physical Therapy Evaluation Patient Details Name: Ivan Stark MRN: [id number] DOB: 11/13/1945 Today's Date: 01/27/2015   History of Present Illness  Ivan Stark is a 69 y.o. male with past medical history of hypertension, GERD, prostate cancer, cerebral meningioma, status post craniotomy 03/2014, and prior history of left leg weakness. He was admitted for severe headache and progressive weakness.  Clinical Impression  Pt admitted with above diagnosis. Pt currently with functional limitations due to the deficits listed below (see PT Problem List).  Pt will benefit from skilled PT to increase their independence and safety with mobility to allow discharge to the venue listed below.  Recommending HHPT, if family is able to provide min assist level of care and 24-hour assist.     Follow Up Recommendations Home health PT;Supervision/Assistance - 24 hour    Equipment Recommendations  None recommended by PT    Recommendations for Other Services       Precautions / Restrictions Precautions Precautions: Fall      Mobility  Bed Mobility Overal bed mobility: Needs Assistance Bed Mobility: Supine to Sit     Supine to sit: Min assist     General bed mobility comments: verbal cues for seqencing  Transfers Overall transfer level: Needs assistance Equipment used: Rolling walker (2 wheeled) Transfers: Sit to/from [facility] Sit to Stand: Mod assist;+2 safety/equipment Stand pivot transfers: +2 safety/equipment;Min assist       General transfer comment: Mod assist for initial stance due to pt is retropulsive. Multi attempts required to maintain balance in initial stance with RW.  Ambulation/Gait Ambulation/Gait assistance: +2 safety/equipment;Min assist Ambulation Distance (Feet): 40 Feet Assistive device: Rolling walker (2 wheeled) Gait Pattern/deviations: Step-through pattern;Decreased stride length;Decreased step length - left Gait velocity: decreased Gait  velocity interpretation: <1.8 ft/sec, indicative of risk for recurrent falls General Gait Details: decreased foot clearance LLE  Stairs            Wheelchair Mobility    Modified Rankin (Stroke Patients Only)       Balance Overall balance assessment: Needs assistance;History of Falls Sitting-balance support: Feet supported;No upper extremity supported Sitting balance-Leahy Scale: Good     Standing balance support: During functional activity;Bilateral upper extremity supported Standing balance-Leahy Scale: Poor Standing balance comment: retropulsive with initial stance                             Pertinent Vitals/Pain Pain Assessment: No/denies pain    Home Living Family/patient expects to be discharged to:: Private residence Living Arrangements: Spouse/significant other Available Help at Discharge: Family;Friend(s);Available 24 hours/day Type of Home: House Home Access: Stairs to enter Entrance Stairs-Rails: None Entrance Stairs-Number of Steps: 3 Home Layout: One level Home Equipment: Walker - 2 wheels;Wheelchair - manual      Prior Function Level of Independence: Independent with assistive device(s)               Hand Dominance   Dominant Hand: Right    Extremity/Trunk Assessment   Upper Extremity Assessment: Defer to OT evaluation           Lower Extremity Assessment: Generalized weakness      Cervical / Trunk Assessment: Kyphotic  Communication   Communication: No difficulties  Cognition Arousal/Alertness: Awake/alert Behavior During Therapy: WFL for tasks assessed/performed Overall Cognitive Status: No family/caregiver present to determine baseline cognitive functioning  General Comments      Exercises        Assessment/Plan    PT Assessment Patient needs continued PT services  PT Diagnosis Difficulty walking;Generalized weakness   PT Problem List Decreased strength;Decreased  activity tolerance;Decreased balance;Decreased mobility;Decreased cognition;Decreased safety awareness  PT Treatment Interventions DME instruction;Gait training;Functional mobility training;Stair training;Therapeutic activities;Therapeutic exercise;Patient/family education;Balance training   PT Goals (Current goals can be found in the Care Plan section) Acute Rehab PT Goals Patient Stated Goal: home PT Goal Formulation: With patient Time For Goal Achievement: 02/10/15 Potential to Achieve Goals: Fair    Frequency Min 3X/week   Barriers to discharge        Co-evaluation PT/OT/SLP Co-Evaluation/Treatment: Yes Reason for Co-Treatment: Complexity of the patient's impairments (multi-system involvement) PT goals addressed during session: Mobility/safety with mobility;Balance         End of Session Equipment Utilized During Treatment: Gait belt Activity Tolerance: Patient tolerated treatment well Patient left: in chair;with call bell/phone within reach;with chair alarm set Nurse Communication: Mobility status;Precautions    Functional Assessment Tool Used: clinical judgement Functional Limitation: Mobility: Walking and moving around Mobility: Walking and Moving Around Current Status [phone]): At least 20 percent but less than 40 percent impaired, limited or restricted Mobility: Walking and Moving Around Goal Status [phone]): At least 1 percent but less than 20 percent impaired, limited or restricted    Time: 0942-1030 PT Time Calculation (min) (ACUTE ONLY): 48 min   Charges:   PT Evaluation $Initial PT Evaluation Tier I: 1 Procedure PT Treatments $Gait Training: 8-22 mins   PT G Codes:   PT G-Codes **NOT FOR INPATIENT CLASS** Functional Assessment Tool Used: clinical judgement Functional Limitation: Mobility: Walking and moving around Mobility: Walking and Moving Around Current Status (D2202): At least 20 percent but less than 40 percent impaired, limited or  restricted Mobility: Walking and Moving Around Goal Status [phone]): At least 1 percent but less than 20 percent impaired, limited or restricted    Lorriane Shire 01/27/2015, 12:44 PM

## 2015-01-27 NOTE — Progress Notes (Signed)
Occupational Therapy Evaluation Patient Details Name: Ivan Stark MRN: [id number] DOB: Nov 27, 1945 Today's Date: 01/27/2015    History of Present Illness Ivan Stark is a 69 y.o. male with past medical history of hypertension, GERD, prostate cancer, cerebral meningioma, status post craniotomy 03/2014, and prior history of left leg weakness. He was admitted for severe headache and progressive weakness.   Clinical Impression   Pt admitted with the above diagnoses and presents with below problem list. Pt will benefit from continued acute OT to address the below listed deficits and maximize independence with BADLs prior to d/c to venue below. PTA pt was mod I with ADLs though it is noted he was experiencing multiple falls per day recently. Pt is currently mod A +2 safety/equipment for LB ADLs and transfers. Noted PT f/u note regarding decreased assist available at home therefore recommending SNF at d/c. OT to continue to follow acutely.      Follow Up Recommendations  SNF;Supervision/Assistance - 24 hour    Equipment Recommendations  Other (comment) (TBD next venue)    Recommendations for Other Services       Precautions / Restrictions Precautions Precautions: Fall;Other (comment) (pt reports multiple falls per day recently) Restrictions Weight Bearing Restrictions: No      Mobility Bed Mobility Overal bed mobility: Needs Assistance Bed Mobility: Supine to Sit     Supine to sit: Min assist     General bed mobility comments: verbal cues for seqencing  Transfers Overall transfer level: Needs assistance Equipment used: Rolling walker (2 wheeled) Transfers: Sit to/from Stand Sit to Stand: Mod assist;+2 safety/equipment Stand pivot transfers: +2 safety/equipment;Min assist       General transfer comment: Pt with LOB upon initial stand. Subsequent transfers during session requiring less assist.     Balance Overall balance assessment: Needs assistance;History of  Falls Sitting-balance support: No upper extremity supported;Feet supported Sitting balance-Leahy Scale: Good     Standing balance support: Bilateral upper extremity supported;During functional activity Standing balance-Leahy Scale: Poor Standing balance comment: retropulsive with initial stance                            ADL Overall ADL's : Needs assistance/impaired Eating/Feeding: Set up;Sitting   Grooming: Min guard;Oral care;Standing Grooming Details (indicate cue type and reason): Pt stood with chair positioned behind him to complete oral care in standing. Pt utizlized external supports.  Upper Body Bathing: Set up;Sitting   Lower Body Bathing: Moderate assistance;+2 for safety/equipment   Upper Body Dressing : Set up;Sitting   Lower Body Dressing: Moderate assistance;+2 for safety/equipment;Sit to/from stand   Toilet Transfer: Minimal assistance;+2 for safety/equipment;Ambulation   Toileting- Clothing Manipulation and Hygiene: Minimal assistance;Sit to/from stand   Tub/ Shower Transfer: Minimal assistance;+2 for safety/equipment;Ambulation;Shower seat;Rolling walker   Functional mobility during ADLs: Minimal assistance;+2 for safety/equipment;Rolling walker General ADL Comments: Pt completed household distance ambulation with min A +2 safety level. Pt noted to fall back onto bed during initial attempt to stand form EOB. Pt with overall generalized wekaness and impaired balance impacting level of assist. Pt completed oral care in standing position as detailed above and bed mobiilty as detailed below. No family present during session.      Vision Additional Comments: Pt able to read therapist name badge and clock on wall. Ocular ROM appears WFL. Potentially some decreased vision in left superior field. Difficult to fully assess due to cognition.    Perception     Praxis  Pertinent Vitals/Pain Pain Assessment: No/denies pain     Hand Dominance Right    Extremity/Trunk Assessment Upper Extremity Assessment Upper Extremity Assessment: Overall WFL for tasks assessed;Generalized weakness   Lower Extremity Assessment Lower Extremity Assessment: Defer to PT evaluation   Cervical / Trunk Assessment Cervical / Trunk Assessment: Kyphotic   Communication Communication Communication: No difficulties   Cognition Arousal/Alertness: Awake/alert Behavior During Therapy: WFL for tasks assessed/performed Overall Cognitive Status: No family/caregiver present to determine baseline cognitive functioning       Memory: Decreased short-term memory             General Comments       Exercises       Shoulder Instructions      Home Living Family/patient expects to be discharged to:: Private residence Living Arrangements: Spouse/significant other Available Help at Discharge: Family;Friend(s);Available 24 hours/day Type of Home: House Home Access: Stairs to enter [employer] of Steps: 3 Entrance Stairs-Rails: None Home Layout: One level     Bathroom Shower/Tub: Tub/shower unit;Walk-in shower;Door;Curtain   Bathroom Toilet: Standard     Home Equipment: Environmental consultant - 2 wheels;Wheelchair - manual;Shower seat;Bedside commode          Prior Functioning/Environment Level of Independence: Independent with assistive device(s)             OT Diagnosis: Generalized weakness;Cognitive deficits   OT Problem List: Decreased strength;Decreased activity tolerance;Impaired balance (sitting and/or standing);Decreased cognition;Impaired vision/perception;Decreased safety awareness;Decreased knowledge of use of DME or AE;Decreased knowledge of precautions   OT Treatment/Interventions: Self-care/ADL training;Therapeutic exercise;DME and/or AE instruction;Therapeutic activities;Cognitive remediation/compensation;Patient/family education;Balance training;Visual/perceptual remediation/compensation    OT Goals(Current goals can be found in  the care plan section) Acute Rehab OT Goals Patient Stated Goal: home OT Goal Formulation: With patient Time For Goal Achievement: 02/03/15 Potential to Achieve Goals: Good ADL Goals Pt Will Perform Grooming: with modified independence;standing Pt Will Perform Lower Body Bathing: with min guard assist;with adaptive equipment;sit to/from stand Pt Will Perform Lower Body Dressing: with min guard assist;with adaptive equipment;sit to/from stand Pt Will Transfer to Toilet: with min guard assist;ambulating;bedside commode Pt Will Perform Toileting - Clothing Manipulation and hygiene: with min guard assist;sit to/from stand Pt Will Perform Tub/Shower Transfer: Tub transfer;ambulating;shower seat;rolling walker Pt/caregiver will Perform Home Exercise Program: Both right and left upper extremity;With written HEP provided;With theraband  OT Frequency: Min 2X/week   Barriers to D/C:            Co-evaluation PT/OT/SLP Co-Evaluation/Treatment: Yes Reason for Co-Treatment: Complexity of the patient's impairments (multi-system involvement) PT goals addressed during session: Mobility/safety with mobility;Balance OT goals addressed during session: ADL's and self-care      End of Session Equipment Utilized During Treatment: Gait belt;Rolling walker  Activity Tolerance: Patient tolerated treatment well Patient left: in chair;with call bell/phone within reach;with chair alarm set   Time: 5188-4166 OT Time Calculation (min): 46 min Charges:  OT General Charges $OT Visit: 1 Procedure OT Evaluation $Initial OT Evaluation Tier I: 1 Procedure OT Treatments $Self Care/Home Management : 8-22 mins G-Codes: OT G-codes **NOT FOR INPATIENT CLASS** Functional Assessment Tool Used: clinical judgement Functional Limitation: Self care Self Care Current Status (A6301): At least 40 percent but less than 60 percent impaired, limited or restricted Self Care Goal Status (S0109): At least 20 percent but less  than 40 percent impaired, limited or restricted  Hortencia Pilar 01/27/2015, 3:49 PM

## 2015-01-27 NOTE — Progress Notes (Signed)
Patient states he is tired, moved from the chair back to the bed.  No distress or pain. Wife at bedside.

## 2015-01-27 NOTE — Progress Notes (Signed)
VASCULAR LAB PRELIMINARY  PRELIMINARY  PRELIMINARY  PRELIMINARY  Carotid duplex  completed.    Preliminary report:  Bilateral:  1-39% ICA stenosis.  Vertebral artery flow is antegrade.      Ivan Stark, RVT 01/27/2015, 3:21 PM

## 2015-01-27 NOTE — Progress Notes (Signed)
Patient sitting up in chair, no distress.  Wife present in room.

## 2015-01-27 NOTE — Progress Notes (Signed)
Patient lying in bed, no distress, family at bedside

## 2015-01-27 NOTE — Progress Notes (Addendum)
Subjective: Patient OOB in chair today.  Continued weakness.  Objective: Current vital signs: BP 99/67 mmHg  Pulse 82  Temp(Src) 97.5 F (36.4 C) (Oral)  Resp 20  Ht 6\' 4"  (1.93 m)  Wt 79.379 kg (175 lb)  BMI 21.31 kg/m2  SpO2 99% Vital signs in last 24 hours: Temp:  [97.5 F (36.4 C)-98.4 F (36.9 C)] 97.5 F (36.4 C) (08/21 1033) Pulse Rate:  [60-82] 82 (08/21 1033) Resp:  [16-20] 20 (08/21 1033) BP: (99-162)/(62-91) 99/67 mmHg (08/21 1033) SpO2:  [99 %-100 %] 99 % (08/21 1033)  Intake/Output from previous day:   Intake/Output this shift: Total I/O In: 120 [P.O.:120] Out: -  Nutritional status: Diet Carb Modified Fluid consistency:: Thin; Room service appropriate?: Yes  Neurologic Exam: Mental Status: Alert, memory loss noted. Speech fluent but some word finding difficulties noted-unclear if related to cognition. Able to follow 3 step commands with some reinforcement. Cranial Nerves: II: Discs flat bilaterally; Visual fields grossly normal, pupils equal, round, reactive to light and accommodation III,IV, VI: ptosis not present, extra-ocular motions intact bilaterally V,VII: smile symmetric, facial light touch sensation normal bilaterally VIII: hearing normal bilaterally IX,X: gag reflex present XI: bilateral shoulder shrug XII: midline tongue extension Motor: Right :Upper extremity 5/5Left: Upper extremity 5/5 Lower extremity 4+/5Lower extremity 2/5 Tone and bulk:normal tone throughout; no atrophy noted Sensory: Pinprick and light touch intact throughout, bilaterally Deep Tendon Reflexes: 2+ in the upper extremities, 3+ left KJ, 2+ left AJ, 2+ right KJ, absent right AJ Plantars: Right: downgoingLeft: downgoing Cerebellar: normal finger-to-nose testing bilaterally, normal heel to shin on the right. Unable to perform on  the left due to weakness  Lab Results: Basic Metabolic Panel:  Recent Labs Lab 01/26/15 0335 01/26/15 0832  NA 140 138  K 3.8 4.1  CL 107 109  CO2  --  24  GLUCOSE 137* 179*  BUN 15 16  CREATININE 0.90 0.79  CALCIUM  --  9.0    Liver Function Tests:  Recent Labs Lab 01/26/15 0832  AST 20  ALT 19  ALKPHOS 115  BILITOT 0.8  PROT 6.4*  ALBUMIN 3.3*   No results for input(s): LIPASE, AMYLASE in the last 168 hours. No results for input(s): AMMONIA in the last 168 hours.  CBC:  Recent Labs Lab 01/26/15 0331 01/26/15 0335  WBC 5.5  --   NEUTROABS 3.8  --   HGB 13.5 14.6  HCT 41.7 43.0  MCV 89.7  --   PLT 230  --     Cardiac Enzymes: No results for input(s): CKTOTAL, CKMB, CKMBINDEX, TROPONINI in the last 168 hours.  Lipid Panel:  Recent Labs Lab 01/27/15 0656  CHOL 207*  TRIG 97  HDL 33*  CHOLHDL 6.3  VLDL 19  LDLCALC 155*    CBG:  Recent Labs Lab 01/26/15 0939 01/26/15 1858 01/26/15 2105 01/27/15 0632  GLUCAP 173* 150* 212* 123*    Microbiology: Results for orders placed or performed in visit on 05/29/14  Urine culture     Status: None   Collection Time: 05/29/14 11:02 AM  Result Value Ref Range Status   Colony Count 6,000 COLONIES/ML  Final   Organism ID, Bacteria Insignificant Growth  Final    Coagulation Studies:  Recent Labs  01/26/15 0832  LABPROT 12.9  INR 0.95    Imaging: Dg Chest 2 View  01/26/2015   CLINICAL DATA:  Sudden onset headache.  Weakness and cancer.  EXAM: CHEST  2 VIEW  COMPARISON:  Chest CT  12/27/2014  FINDINGS: Normal heart size and stable mediastinal contours, including lower mediastinal widening from hiatal hernia. There is no edema, consolidation, effusion, or pneumothorax.  IMPRESSION: No active cardiopulmonary disease.   Electronically Signed   By: Monte Fantasia M.D.   On: 01/26/2015 05:36   Ct Head Wo Contrast  01/26/2015   CLINICAL DATA:  Headache and history of brain cancer. Metastatic prostate  cancer and history of left frontal meningioma.  EXAM: CT HEAD WITHOUT CONTRAST  TECHNIQUE: Contiguous axial images were obtained from the base of the skull through the vertex without intravenous contrast.  COMPARISON:  09/25/2014  FINDINGS: Skull and Sinuses:Stable appearance of craniotomy at the vertex.  Interval decrease in right mastoid effusion, now layering in the mastoid tip. Small effusion also present in the left mastoid tip.  Orbits: No acute abnormality.  Brain: No evidence of acute infarction, hemorrhage, hydrocephalus, or mass lesion/mass effect.  Stable parasagittal posterior bifrontal gliosis at resected masses. A 7 mm fallcine mass noted on brain MRI April 2016 is not clearly visible. No evidence of new intracranial metastatic disease.  Chronic small vessel disease with ischemic gliosis confluent throughout the bilateral cerebral white matter. Generalized cortical atrophy.  IMPRESSION: 1. No acute intracranial finding. 2. Chronic small vessel disease. 3. Stable post treatment changes at the vertex related to meningioma and metastasis resections. 4. Bilateral mastoid effusions, decreased on the right since April 2016.   Electronically Signed   By: Monte Fantasia M.D.   On: 01/26/2015 04:16   Mr Brain Wo Contrast  01/26/2015   CLINICAL DATA:  Headache. Metastatic prostate cancer. Left frontal meningioma.  EXAM: MRI HEAD WITHOUT CONTRAST  MRA HEAD WITHOUT CONTRAST  TECHNIQUE: Multiplanar, multiecho pulse sequences of the brain and surrounding structures were obtained without intravenous contrast. Angiographic images of the head were obtained using MRA technique without contrast.  COMPARISON:  Head CT same day.  MRI 09/18/2014.  FINDINGS: MRI HEAD FINDINGS  Diffusion imaging shows a punctate focus of restricted diffusion in the left frontal white matter consistent with a small white matter infarction. No mass effect or hemorrhage.  The brainstem and cerebellum are normal. The cerebral hemispheres  again show atrophy with chronic small-vessel ischemic changes throughout the white matter. Focal encephalomalacia and gliosis is present in the medial posterior frontal region bilaterally related to the sites of previous meningioma resections. No evidence of residual or recurrent tumor at this site of the resections. Third small meningioma along the falx projecting more towards the right has enlarged, measuring 10 mm in diameter as opposed 8 mm previously. No evidence of hydrocephalus or extra-axial collection. Flow persists in the sagittal sinus. Fluid again noted in the mastoid air cells bilaterally.  MRA HEAD FINDINGS  Both internal carotid arteries are widely patent into the brain. The anterior and middle cerebral vessels are normal without proximal stenosis, aneurysm or vascular malformation. Both vertebral arteries are widely patent to the basilar. No basilar stenosis. Posterior circulation branch vessels appear normal.  IMPRESSION: Punctate acute white matter infarction in the left frontal lobe. No swelling or mass effect.  No evidence of residual or recurrent tumor at this site of meningioma resection bilaterally along the falx.  Interval growth of a third falcine meningioma, increased in size from 8 mm 10 mm over the last 4 months.  Normal intracranial MR angiography of the large and medium size vessels appear   Electronically Signed   By: Nelson Chimes M.D.   On: 01/26/2015 12:42   Mr Thoracic  Spine Wo Contrast  01/26/2015   CLINICAL DATA:  Progressive left lower extremity weakness, unexplained. Progressive worsening of symptoms. Multiple falls.  EXAM: MRI THORACIC SPINE WITHOUT CONTRAST  TECHNIQUE: Multiplanar, multisequence MR imaging of the thoracic spine was performed. No intravenous contrast was administered.  COMPARISON:  Lumbar exam same day.  CT chest 12/27/2014  FINDINGS: There is an acute compression fracture at T2 affecting the inferior endplate. There is loss of height anteriorly of 50%. No  retropulsed bone or traumatic disc herniation. Very minimal narrowing of the ventral subarachnoid space but no compression of the cord. No abnormal cord signal. No finding to suggest that this represents anything other than a benign osteoporotic fracture.  No other fracture seen in the thoracic region. No marrow space signal to suggest osseous metastatic disease elsewhere.  No abnormal cord signal anywhere in the region.  There are minor degenerative changes in the thoracic spine, none sufficient to result in neurological symptoms. Tiny central disc protrusion at T7-8 without neural compression.  Thyroid goiter and bilateral adrenal masses incidentally demonstrated, as seen on other more appropriate imaging modalities.  IMPRESSION: Acute inferior endplate T2 compression fracture with loss of height of 50% anteriorly. This was not present on a CT scan of the chest dated 07/21. No canal compromise. No abnormal cord signal. No finding to suggest that this represents anything other than a benign osteoporotic fracture. No other finding of significance in the thoracic region.   Electronically Signed   By: Nelson Chimes M.D.   On: 01/26/2015 19:13   Mr Lumbar Spine Wo Contrast  01/26/2015   CLINICAL DATA:  Left sided leg weakness with progressively worsening with gait disturbance. Multiple falls.  EXAM: MRI LUMBAR SPINE WITHOUT CONTRAST  TECHNIQUE: Multiplanar, multisequence MR imaging of the lumbar spine was performed. No intravenous contrast was administered.  COMPARISON:  None.  FINDINGS: There is no abnormality at L3-4 or above. The discs are normal. The canal and foramina are widely patent. The distal cord and conus are normal with the conus tip at L1.  L4-5: Mild desiccation and bulging of the disc. Mild facet hypertrophy. No canal or foraminal stenosis.  L5-S1: Mild bulging of the disc. Mild facet hypertrophy. No canal or foraminal stenosis.  IMPRESSION: No significant finding. Mild degenerative disc disease and  degenerative facet disease at L4-5 and L5-S1. The findings could be associated with back pain, but there is no stenosis or visible neural compression.   Electronically Signed   By: Nelson Chimes M.D.   On: 01/26/2015 13:33   Mr Jodene Nam Head/brain Wo Cm  01/26/2015   CLINICAL DATA:  Headache. Metastatic prostate cancer. Left frontal meningioma.  EXAM: MRI HEAD WITHOUT CONTRAST  MRA HEAD WITHOUT CONTRAST  TECHNIQUE: Multiplanar, multiecho pulse sequences of the brain and surrounding structures were obtained without intravenous contrast. Angiographic images of the head were obtained using MRA technique without contrast.  COMPARISON:  Head CT same day.  MRI 09/18/2014.  FINDINGS: MRI HEAD FINDINGS  Diffusion imaging shows a punctate focus of restricted diffusion in the left frontal white matter consistent with a small white matter infarction. No mass effect or hemorrhage.  The brainstem and cerebellum are normal. The cerebral hemispheres again show atrophy with chronic small-vessel ischemic changes throughout the white matter. Focal encephalomalacia and gliosis is present in the medial posterior frontal region bilaterally related to the sites of previous meningioma resections. No evidence of residual or recurrent tumor at this site of the resections. Third small meningioma along the  falx projecting more towards the right has enlarged, measuring 10 mm in diameter as opposed 8 mm previously. No evidence of hydrocephalus or extra-axial collection. Flow persists in the sagittal sinus. Fluid again noted in the mastoid air cells bilaterally.  MRA HEAD FINDINGS  Both internal carotid arteries are widely patent into the brain. The anterior and middle cerebral vessels are normal without proximal stenosis, aneurysm or vascular malformation. Both vertebral arteries are widely patent to the basilar. No basilar stenosis. Posterior circulation branch vessels appear normal.  IMPRESSION: Punctate acute white matter infarction in the left  frontal lobe. No swelling or mass effect.  No evidence of residual or recurrent tumor at this site of meningioma resection bilaterally along the falx.  Interval growth of a third falcine meningioma, increased in size from 8 mm 10 mm over the last 4 months.  Normal intracranial MR angiography of the large and medium size vessels appear   Electronically Signed   By: Nelson Chimes M.D.   On: 01/26/2015 12:42    Medications:  I have reviewed the patient's current medications. Scheduled: . aspirin  300 mg Rectal Daily   Or  . aspirin  325 mg Oral Daily  . enoxaparin (LOVENOX) injection  40 mg Subcutaneous Q24H  . insulin aspart  0-15 Units Subcutaneous TID WC  . insulin aspart  0-5 Units Subcutaneous QHS  . senna  2 tablet Oral BID    Assessment/Plan: Patient unchanged.  Continued weakness.  MRI of the thoracic spine independently reviewed and shows no evidence of cord abnormalities.  No metastsis noted either.  Suspect progressive weakness is a consequence of his progressive cancer.  LDL 155.  A1c, echocardiogram and carotid dopplers are pending.    Recommendations: 1.  Will follow up stroke w/u 2.  PT/OT 3.  Oncology follow up   LOS: 1 day   Alexis Goodell, MD [facility] [phone] 01/27/2015  10:56 AM

## 2015-01-27 NOTE — Progress Notes (Addendum)
[facility] PROGRESS NOTE  Ivan Stark EHM:[id number] DOB: [birth year] DOA: 01/26/2015 PCP: Alesia Richards, MD  Assessment/Plan:  Principal Problem:   Acute ischemic stoke: workup underway.  Much of patients weakness may be related to progressive cancer and chemo. Will d/w Dr. Benay Spice in am. Wife requesting placement. D/w PT. Patient would be a candidate for SNF. 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 Some blurry vision  Objective: Filed Vitals:   01/27/15 1359  BP: 116/71  Pulse: 63  Temp: 97.6 F (36.4 C)  Resp: 20    Intake/Output Summary (Last 24 hours) at 01/27/15 1514 Last data filed at 01/27/15 1400  Gross per 24 hour  Intake    360 ml  Output    200 ml  Net    160 ml   Filed Weights   01/26/15 0321  Weight: 79.379 kg (175 lb)    Exam:   General:  A and o  Cardiovascular: RRR without MGR  Respiratory: CTA without WRR  Abdomen: S, NT, ND  Ext: no CCE  Neuro:  Motor 5/5  Basic Metabolic Panel:  Recent Labs Lab 01/26/15 0335 01/26/15 0832  NA 140 138  K 3.8 4.1  CL 107 109  CO2  --  24  GLUCOSE 137* 179*  BUN 15 16  CREATININE 0.90 0.79  CALCIUM  --  9.0   Liver Function Tests:  Recent Labs Lab 01/26/15 0832  AST 20  ALT 19  ALKPHOS 115  BILITOT 0.8  PROT 6.4*  ALBUMIN 3.3*   No results for input(s): LIPASE, AMYLASE in the last 168 hours. No results for input(s): AMMONIA in the last 168 hours. CBC:  Recent Labs Lab 01/26/15 0331 01/26/15 0335  WBC 5.5  --   NEUTROABS 3.8  --   HGB 13.5 14.6  HCT 41.7 43.0  MCV 89.7  --   PLT 230  --    Cardiac Enzymes: No results for input(s): CKTOTAL,  CKMB, CKMBINDEX, TROPONINI in the last 168 hours. BNP (last 3 results) No results for input(s): BNP in the last 8760 hours.  ProBNP (last 3 results) No results for input(s): PROBNP in the last 8760 hours.  CBG:  Recent Labs Lab 01/26/15 0939 01/26/15 1858 01/26/15 2105 01/27/15 0632 01/27/15 1221  GLUCAP 173* 150* 212* 123* 223*    No results found for this or any previous visit (from the past 240 hour(s)).   Studies: Dg Chest 2 View  01/26/2015   CLINICAL DATA:  Sudden onset headache.  Weakness and cancer.  EXAM: CHEST  2 VIEW  COMPARISON:  Chest CT 12/27/2014  FINDINGS: Normal heart size and stable mediastinal contours, including lower mediastinal widening from hiatal hernia. There is no edema, consolidation, effusion, or pneumothorax.  IMPRESSION: No active cardiopulmonary disease.   Electronically Signed   By: Monte Fantasia M.D.   On: 01/26/2015 05:36   Ct Head Wo Contrast  01/26/2015   CLINICAL DATA:  Headache and history of brain cancer. Metastatic prostate cancer and history of left frontal meningioma.  EXAM: CT HEAD WITHOUT CONTRAST  TECHNIQUE: Contiguous axial images were obtained from the base of the skull through the vertex without intravenous  contrast.  COMPARISON:  09/25/2014  FINDINGS: Skull and Sinuses:Stable appearance of craniotomy at the vertex.  Interval decrease in right mastoid effusion, now layering in the mastoid tip. Small effusion also present in the left mastoid tip.  Orbits: No acute abnormality.  Brain: No evidence of acute infarction, hemorrhage, hydrocephalus, or mass lesion/mass effect.  Stable parasagittal posterior bifrontal gliosis at resected masses. A 7 mm fallcine mass noted on brain MRI April 2016 is not clearly visible. No evidence of new intracranial metastatic disease.  Chronic small vessel disease with ischemic gliosis confluent throughout the bilateral cerebral white matter. Generalized cortical atrophy.  IMPRESSION: 1. No acute intracranial  finding. 2. Chronic small vessel disease. 3. Stable post treatment changes at the vertex related to meningioma and metastasis resections. 4. Bilateral mastoid effusions, decreased on the right since April 2016.   Electronically Signed   By: Monte Fantasia M.D.   On: 01/26/2015 04:16   Mr Brain Wo Contrast  01/26/2015   CLINICAL DATA:  Headache. Metastatic prostate cancer. Left frontal meningioma.  EXAM: MRI HEAD WITHOUT CONTRAST  MRA HEAD WITHOUT CONTRAST  TECHNIQUE: Multiplanar, multiecho pulse sequences of the brain and surrounding structures were obtained without intravenous contrast. Angiographic images of the head were obtained using MRA technique without contrast.  COMPARISON:  Head CT same day.  MRI 09/18/2014.  FINDINGS: MRI HEAD FINDINGS  Diffusion imaging shows a punctate focus of restricted diffusion in the left frontal white matter consistent with a small white matter infarction. No mass effect or hemorrhage.  The brainstem and cerebellum are normal. The cerebral hemispheres again show atrophy with chronic small-vessel ischemic changes throughout the white matter. Focal encephalomalacia and gliosis is present in the medial posterior frontal region bilaterally related to the sites of previous meningioma resections. No evidence of residual or recurrent tumor at this site of the resections. Third small meningioma along the falx projecting more towards the right has enlarged, measuring 10 mm in diameter as opposed 8 mm previously. No evidence of hydrocephalus or extra-axial collection. Flow persists in the sagittal sinus. Fluid again noted in the mastoid air cells bilaterally.  MRA HEAD FINDINGS  Both internal carotid arteries are widely patent into the brain. The anterior and middle cerebral vessels are normal without proximal stenosis, aneurysm or vascular malformation. Both vertebral arteries are widely patent to the basilar. No basilar stenosis. Posterior circulation branch vessels appear normal.   IMPRESSION: Punctate acute white matter infarction in the left frontal lobe. No swelling or mass effect.  No evidence of residual or recurrent tumor at this site of meningioma resection bilaterally along the falx.  Interval growth of a third falcine meningioma, increased in size from 8 mm 10 mm over the last 4 months.  Normal intracranial MR angiography of the large and medium size vessels appear   Electronically Signed   By: Nelson Chimes M.D.   On: 01/26/2015 12:42   Mr Thoracic Spine Wo Contrast  01/26/2015   CLINICAL DATA:  Progressive left lower extremity weakness, unexplained. Progressive worsening of symptoms. Multiple falls.  EXAM: MRI THORACIC SPINE WITHOUT CONTRAST  TECHNIQUE: Multiplanar, multisequence MR imaging of the thoracic spine was performed. No intravenous contrast was administered.  COMPARISON:  Lumbar exam same day.  CT chest 12/27/2014  FINDINGS: There is an acute compression fracture at T2 affecting the inferior endplate. There is loss of height anteriorly of 50%. No retropulsed bone or traumatic disc herniation. Very minimal narrowing of the ventral subarachnoid space but no compression of the  cord. No abnormal cord signal. No finding to suggest that this represents anything other than a benign osteoporotic fracture.  No other fracture seen in the thoracic region. No marrow space signal to suggest osseous metastatic disease elsewhere.  No abnormal cord signal anywhere in the region.  There are minor degenerative changes in the thoracic spine, none sufficient to result in neurological symptoms. Tiny central disc protrusion at T7-8 without neural compression.  Thyroid goiter and bilateral adrenal masses incidentally demonstrated, as seen on other more appropriate imaging modalities.  IMPRESSION: Acute inferior endplate T2 compression fracture with loss of height of 50% anteriorly. This was not present on a CT scan of the chest dated 07/21. No canal compromise. No abnormal cord signal. No  finding to suggest that this represents anything other than a benign osteoporotic fracture. No other finding of significance in the thoracic region.   Electronically Signed   By: Nelson Chimes M.D.   On: 01/26/2015 19:13   Mr Lumbar Spine Wo Contrast  01/26/2015   CLINICAL DATA:  Left sided leg weakness with progressively worsening with gait disturbance. Multiple falls.  EXAM: MRI LUMBAR SPINE WITHOUT CONTRAST  TECHNIQUE: Multiplanar, multisequence MR imaging of the lumbar spine was performed. No intravenous contrast was administered.  COMPARISON:  None.  FINDINGS: There is no abnormality at L3-4 or above. The discs are normal. The canal and foramina are widely patent. The distal cord and conus are normal with the conus tip at L1.  L4-5: Mild desiccation and bulging of the disc. Mild facet hypertrophy. No canal or foraminal stenosis.  L5-S1: Mild bulging of the disc. Mild facet hypertrophy. No canal or foraminal stenosis.  IMPRESSION: No significant finding. Mild degenerative disc disease and degenerative facet disease at L4-5 and L5-S1. The findings could be associated with back pain, but there is no stenosis or visible neural compression.   Electronically Signed   By: Nelson Chimes M.D.   On: 01/26/2015 13:33   Mr Jodene Nam Head/brain Wo Cm  01/26/2015   CLINICAL DATA:  Headache. Metastatic prostate cancer. Left frontal meningioma.  EXAM: MRI HEAD WITHOUT CONTRAST  MRA HEAD WITHOUT CONTRAST  TECHNIQUE: Multiplanar, multiecho pulse sequences of the brain and surrounding structures were obtained without intravenous contrast. Angiographic images of the head were obtained using MRA technique without contrast.  COMPARISON:  Head CT same day.  MRI 09/18/2014.  FINDINGS: MRI HEAD FINDINGS  Diffusion imaging shows a punctate focus of restricted diffusion in the left frontal white matter consistent with a small white matter infarction. No mass effect or hemorrhage.  The brainstem and cerebellum are normal. The cerebral  hemispheres again show atrophy with chronic small-vessel ischemic changes throughout the white matter. Focal encephalomalacia and gliosis is present in the medial posterior frontal region bilaterally related to the sites of previous meningioma resections. No evidence of residual or recurrent tumor at this site of the resections. Third small meningioma along the falx projecting more towards the right has enlarged, measuring 10 mm in diameter as opposed 8 mm previously. No evidence of hydrocephalus or extra-axial collection. Flow persists in the sagittal sinus. Fluid again noted in the mastoid air cells bilaterally.  MRA HEAD FINDINGS  Both internal carotid arteries are widely patent into the brain. The anterior and middle cerebral vessels are normal without proximal stenosis, aneurysm or vascular malformation. Both vertebral arteries are widely patent to the basilar. No basilar stenosis. Posterior circulation branch vessels appear normal.  IMPRESSION: Punctate acute white matter infarction in the left frontal  lobe. No swelling or mass effect.  No evidence of residual or recurrent tumor at this site of meningioma resection bilaterally along the falx.  Interval growth of a third falcine meningioma, increased in size from 8 mm 10 mm over the last 4 months.  Normal intracranial MR angiography of the large and medium size vessels appear   Electronically Signed   By: Nelson Chimes M.D.   On: 01/26/2015 12:42    Scheduled Meds: . aspirin  300 mg Rectal Daily   Or  . aspirin  325 mg Oral Daily  . enoxaparin (LOVENOX) injection  40 mg Subcutaneous Q24H  . insulin aspart  0-15 Units Subcutaneous TID WC  . insulin aspart  0-5 Units Subcutaneous QHS  . senna  2 tablet Oral BID   Continuous Infusions:   Time spent: 25 minutes  [facility]  [email or web address], password [facility] 01/27/2015, 3:14 PM  LOS: 1 day

## 2015-01-28 ENCOUNTER — Other Ambulatory Visit: Payer: Medicare Other

## 2015-01-28 ENCOUNTER — Ambulatory Visit (HOSPITAL_COMMUNITY): Payer: Medicare Other

## 2015-01-28 ENCOUNTER — Ambulatory Visit: Payer: Medicare Other | Admitting: Oncology

## 2015-01-28 DIAGNOSIS — Z923 Personal history of irradiation: Secondary | ICD-10-CM | POA: Diagnosis not present

## 2015-01-28 DIAGNOSIS — R29898 Other symptoms and signs involving the musculoskeletal system: Secondary | ICD-10-CM | POA: Diagnosis not present

## 2015-01-28 DIAGNOSIS — D320 Benign neoplasm of cerebral meninges: Secondary | ICD-10-CM | POA: Diagnosis not present

## 2015-01-28 DIAGNOSIS — C7931 Secondary malignant neoplasm of brain: Secondary | ICD-10-CM

## 2015-01-28 DIAGNOSIS — H538 Other visual disturbances: Secondary | ICD-10-CM | POA: Diagnosis present

## 2015-01-28 DIAGNOSIS — I6350 Cerebral infarction due to unspecified occlusion or stenosis of unspecified cerebral artery: Secondary | ICD-10-CM | POA: Diagnosis not present

## 2015-01-28 DIAGNOSIS — G459 Transient cerebral ischemic attack, unspecified: Secondary | ICD-10-CM | POA: Diagnosis not present

## 2015-01-28 DIAGNOSIS — E785 Hyperlipidemia, unspecified: Secondary | ICD-10-CM | POA: Diagnosis present

## 2015-01-28 DIAGNOSIS — G608 Other hereditary and idiopathic neuropathies: Secondary | ICD-10-CM | POA: Diagnosis present

## 2015-01-28 DIAGNOSIS — I639 Cerebral infarction, unspecified: Secondary | ICD-10-CM | POA: Diagnosis present

## 2015-01-28 DIAGNOSIS — E1140 Type 2 diabetes mellitus with diabetic neuropathy, unspecified: Secondary | ICD-10-CM | POA: Diagnosis not present

## 2015-01-28 DIAGNOSIS — T451X5A Adverse effect of antineoplastic and immunosuppressive drugs, initial encounter: Secondary | ICD-10-CM | POA: Diagnosis present

## 2015-01-28 DIAGNOSIS — Z7982 Long term (current) use of aspirin: Secondary | ICD-10-CM | POA: Diagnosis not present

## 2015-01-28 DIAGNOSIS — R2689 Other abnormalities of gait and mobility: Secondary | ICD-10-CM | POA: Diagnosis present

## 2015-01-28 DIAGNOSIS — I10 Essential (primary) hypertension: Secondary | ICD-10-CM | POA: Diagnosis present

## 2015-01-28 DIAGNOSIS — K219 Gastro-esophageal reflux disease without esophagitis: Secondary | ICD-10-CM | POA: Diagnosis present

## 2015-01-28 DIAGNOSIS — Z809 Family history of malignant neoplasm, unspecified: Secondary | ICD-10-CM | POA: Diagnosis not present

## 2015-01-28 DIAGNOSIS — C61 Malignant neoplasm of prostate: Secondary | ICD-10-CM | POA: Diagnosis present

## 2015-01-28 DIAGNOSIS — Z79899 Other long term (current) drug therapy: Secondary | ICD-10-CM | POA: Diagnosis not present

## 2015-01-28 DIAGNOSIS — E559 Vitamin D deficiency, unspecified: Secondary | ICD-10-CM | POA: Diagnosis present

## 2015-01-28 DIAGNOSIS — E1142 Type 2 diabetes mellitus with diabetic polyneuropathy: Secondary | ICD-10-CM | POA: Diagnosis present

## 2015-01-28 DIAGNOSIS — C7970 Secondary malignant neoplasm of unspecified adrenal gland: Secondary | ICD-10-CM | POA: Diagnosis present

## 2015-01-28 DIAGNOSIS — K5900 Constipation, unspecified: Secondary | ICD-10-CM | POA: Diagnosis present

## 2015-01-28 DIAGNOSIS — R51 Headache: Secondary | ICD-10-CM | POA: Diagnosis present

## 2015-01-28 DIAGNOSIS — R5381 Other malaise: Secondary | ICD-10-CM | POA: Diagnosis present

## 2015-01-28 DIAGNOSIS — R531 Weakness: Secondary | ICD-10-CM | POA: Diagnosis present

## 2015-01-28 DIAGNOSIS — Z8249 Family history of ischemic heart disease and other diseases of the circulatory system: Secondary | ICD-10-CM | POA: Diagnosis not present

## 2015-01-28 DIAGNOSIS — Z888 Allergy status to other drugs, medicaments and biological substances status: Secondary | ICD-10-CM | POA: Diagnosis not present

## 2015-01-28 DIAGNOSIS — R296 Repeated falls: Secondary | ICD-10-CM | POA: Diagnosis present

## 2015-01-28 DIAGNOSIS — R634 Abnormal weight loss: Secondary | ICD-10-CM | POA: Diagnosis present

## 2015-01-28 LAB — GLUCOSE, CAPILLARY
GLUCOSE-CAPILLARY: 149 mg/dL — AB (ref 65–99)
GLUCOSE-CAPILLARY: 120 mg/dL — AB (ref 65–99)
GLUCOSE-CAPILLARY: 165 mg/dL — AB (ref 65–99)
Glucose-Capillary: 71 mg/dL (ref 65–99)
Glucose-Capillary: 118 mg/dL — ABNORMAL HIGH (ref 65–99)

## 2015-01-28 LAB — HEMOGLOBIN A1C
HEMOGLOBIN A1C: 7 % — AB (ref 4.8–5.6)
MEAN PLASMA GLUCOSE: 154 mg/dL

## 2015-01-28 NOTE — Clinical Social Work Placement (Signed)
   CLINICAL SOCIAL WORK PLACEMENT  NOTE  Date:  01/28/2015  Patient Details  Name: Ivan Stark MRN: [id number] Date of Birth: Nov 26, 1945  Clinical Social Work is seeking post-discharge placement for this patient at the [place] level of care (*CSW will initial, date and re-position this form in  chart as items are completed):  Yes   Patient/family provided with [facility] Work Department's list of facilities offering this level of care within the geographic area requested by the patient (or if unable, by the patient's family).  Yes   Patient/family informed of their freedom to choose among providers that offer the needed level of care, that participate in Medicare, Medicaid or managed care program needed by the patient, have an available bed and are willing to accept the patient.  Yes   Patient/family informed of [facility]'s ownership interest in [facility] and [facility], as well as of the fact that they are under no obligation to receive care at these facilities.  PASRR submitted to EDS on 01/28/15     PASRR number received on 01/28/15     Existing PASRR number confirmed on       FL2 transmitted to all facilities in geographic area requested by pt/family on 01/28/15     FL2 transmitted to all facilities within larger geographic area on       Patient informed that his/her managed care company has contracts with or will negotiate with certain facilities, including the following:            Patient/family informed of bed offers received.  Patient chooses bed at       Physician recommends and patient chooses bed at      Patient to be transferred to   on  .  Patient to be transferred to facility by       Patient family notified on   of transfer.  Name of family member notified:        PHYSICIAN       Additional Comment:    _______________________________________________ Greta Doom, LCSW 01/28/2015, 1:52 PM

## 2015-01-28 NOTE — Progress Notes (Signed)
Subjective: Improved LE weakness.   Exam: Filed Vitals:   01/28/15 0958  BP: 110/50  Pulse: 67  Temp: 98 F (36.7 C)  Resp: 20    HEENT-  Normocephalic, no lesions, without obvious abnormality.  Normal external eye and conjunctiva.  Normal TM's bilaterally.  Normal auditory canals and external ears. Normal external nose, mucus membranes and septum.  Normal pharynx. Cardiovascular- S1, S2 normal, pulses palpable throughout   Lungs- chest clear, no wheezing, rales, normal symmetric air entry Abdomen- normal findings: bowel sounds normal Extremities- no edema     Gen: In bed, NAD MS: Alert and oriented to hospital, month and year. No significant word finding difficulties.  CN: EOMI, TML, Face symmetric, sensation intact, EOMI Motor: bilateral UE 5/5, right LE 5/5 left LE 3/5 Sensory:intact throughout DTR:2+ in bilateral UE, 2+ bilateral KJ, no AJ noted.   Pertinent Labs: Carotid doppler 1-39% bilaterally 2 D echo 60-65% EF no PFO LDL 155 A1C pending  Etta Quill PA-C [facility] [phone]  Impression: 69 yo M with small cell cancer metastatic. He has had radiation and chemotherapy. I suspect that his weakness is related to his cnacer or treatment either through side effects or possibly paraneoplastic process. Either way, treatment would be treatment of the underlying malignancy.   Given the weakness and prognosis of SCC, I would not favor starting statin therapy.   Finally, though less likely, I do think that this area could represent a new met and if it would change management then a repeat scan could be obtained in a few weeks. If it is a met, then no ASA would be needed.    Recommendations: 1) continue asa 30m 2) could repeat scan in a few weeks to a month assess for normal evolution of infarct vs tumor growth.  3) No further recs at this time. Please call with any further questions or concerns.    MRoland Rack MD [facility] [phone] If 7pm- 7am, please page neurology on call as listed in [place]   01/28/2015, 10:05 AM

## 2015-01-28 NOTE — Clinical Social Work Note (Signed)
Clinical Social Worker presented bed offers to patient and spouse. Patient and family chooses bed at [facility], [facility].  [facility] Medicare authorization has been initiated.   CSW will continue to follow pt and pt's family for continued support and to facilitate pt's discharge needs once medically stable.   Glendon Axe, MSW, LCSWA [phone] 01/28/2015 3:39 PM

## 2015-01-28 NOTE — Progress Notes (Signed)
IP PROGRESS NOTE  Subjective:   Ivan Stark is known to me with a history of metastatic small cell carcinoma. He was admitted 01/26/2015 for evaluation of a headache. His family also reported progressive left-sided weakness and gait imbalance with multiple falls. 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  Ivan Stark appears confused this morning. No specific complaint.  He completed a first cycle of salvage chemotherapy with temozolomide beginning on 01/11/2015. He reports no nausea with the temozolomide.   Objective: Vital signs in last 24 hours: Blood pressure 110/50, pulse 67, temperature 98 F (36.7 C), temperature source Oral, resp. rate 20, height 6\' 4"  (1.93 m), weight 175 lb (79.379 kg), SpO2 99 %.  Intake/Output from previous day: 08/21 0701 - 08/22 0700 In: 360 [P.O.:360] Out: 200 [Urine:200]  Physical Exam:  HEENT: No thrush or ulcers Lungs: Clear bilaterally Cardiac: Regular rate and rhythm Abdomen: No hepatomegaly, nontender, no mass Extremities: No leg edema Neurologic: Alert, follows commands, the motor exam appears intact in the upper and lower extremities. I did not test the gait. He is oriented to place, but is unable to give a history Lab Results:  Recent Labs  01/26/15 0331 01/26/15 0335  WBC 5.5  --   HGB 13.5 14.6  HCT 41.7 43.0  PLT 230  --    ANC 3.8  BMET  Recent Labs  01/26/15 0335 01/26/15 0832  NA 140 138  K 3.8 4.1  CL 107 109  CO2  --  24  GLUCOSE 137* 179*  BUN 15 16  CREATININE 0.90 0.79  CALCIUM  --  9.0    Studies/Results: Mr Brain Wo Contrast  01/26/2015   CLINICAL DATA:  Headache. Metastatic prostate cancer. Left frontal meningioma.  EXAM: MRI HEAD WITHOUT CONTRAST  MRA HEAD WITHOUT CONTRAST  TECHNIQUE: Multiplanar, multiecho pulse sequences of the brain and surrounding structures were obtained without intravenous contrast. Angiographic images of the head were obtained using MRA technique without contrast.   COMPARISON:  Head CT same day.  MRI 09/18/2014.  FINDINGS: MRI HEAD FINDINGS  Diffusion imaging shows a punctate focus of restricted diffusion in the left frontal white matter consistent with a small white matter infarction. No mass effect or hemorrhage.  The brainstem and cerebellum are normal. The cerebral hemispheres again show atrophy with chronic small-vessel ischemic changes throughout the white matter. Focal encephalomalacia and gliosis is present in the medial posterior frontal region bilaterally related to the sites of previous meningioma resections. No evidence of residual or recurrent tumor at this site of the resections. Third small meningioma along the falx projecting more towards the right has enlarged, measuring 10 mm in diameter as opposed 8 mm previously. No evidence of hydrocephalus or extra-axial collection. Flow persists in the sagittal sinus. Fluid again noted in the mastoid air cells bilaterally.  MRA HEAD FINDINGS  Both internal carotid arteries are widely patent into the brain. The anterior and middle cerebral vessels are normal without proximal stenosis, aneurysm or vascular malformation. Both vertebral arteries are widely patent to the basilar. No basilar stenosis. Posterior circulation branch vessels appear normal.  IMPRESSION: Punctate acute white matter infarction in the left frontal lobe. No swelling or mass effect.  No evidence of residual or recurrent tumor at this site of meningioma resection bilaterally along the falx.  Interval growth of a third falcine meningioma, increased in size from 8 mm 10 mm over the last 4 months.  Normal intracranial MR angiography of the large and medium size  vessels appear   Electronically Signed   By: Nelson Chimes M.D.   On: 01/26/2015 12:42   Mr Thoracic Spine Wo Contrast  01/26/2015   CLINICAL DATA:  Progressive left lower extremity weakness, unexplained. Progressive worsening of symptoms. Multiple falls.  EXAM: MRI THORACIC SPINE WITHOUT CONTRAST   TECHNIQUE: Multiplanar, multisequence MR imaging of the thoracic spine was performed. No intravenous contrast was administered.  COMPARISON:  Lumbar exam same day.  CT chest 12/27/2014  FINDINGS: There is an acute compression fracture at T2 affecting the inferior endplate. There is loss of height anteriorly of 50%. No retropulsed bone or traumatic disc herniation. Very minimal narrowing of the ventral subarachnoid space but no compression of the cord. No abnormal cord signal. No finding to suggest that this represents anything other than a benign osteoporotic fracture.  No other fracture seen in the thoracic region. No marrow space signal to suggest osseous metastatic disease elsewhere.  No abnormal cord signal anywhere in the region.  There are minor degenerative changes in the thoracic spine, none sufficient to result in neurological symptoms. Tiny central disc protrusion at T7-8 without neural compression.  Thyroid goiter and bilateral adrenal masses incidentally demonstrated, as seen on other more appropriate imaging modalities.  IMPRESSION: Acute inferior endplate T2 compression fracture with loss of height of 50% anteriorly. This was not present on a CT scan of the chest dated 07/21. No canal compromise. No abnormal cord signal. No finding to suggest that this represents anything other than a benign osteoporotic fracture. No other finding of significance in the thoracic region.   Electronically Signed   By: Nelson Chimes M.D.   On: 01/26/2015 19:13   Mr Lumbar Spine Wo Contrast  01/26/2015   CLINICAL DATA:  Left sided leg weakness with progressively worsening with gait disturbance. Multiple falls.  EXAM: MRI LUMBAR SPINE WITHOUT CONTRAST  TECHNIQUE: Multiplanar, multisequence MR imaging of the lumbar spine was performed. No intravenous contrast was administered.  COMPARISON:  None.  FINDINGS: There is no abnormality at L3-4 or above. The discs are normal. The canal and foramina are widely patent. The distal  cord and conus are normal with the conus tip at L1.  L4-5: Mild desiccation and bulging of the disc. Mild facet hypertrophy. No canal or foraminal stenosis.  L5-S1: Mild bulging of the disc. Mild facet hypertrophy. No canal or foraminal stenosis.  IMPRESSION: No significant finding. Mild degenerative disc disease and degenerative facet disease at L4-5 and L5-S1. The findings could be associated with back pain, but there is no stenosis or visible neural compression.   Electronically Signed   By: Nelson Chimes M.D.   On: 01/26/2015 13:33   Mr Jodene Stark Head/brain Wo Cm  01/26/2015   CLINICAL DATA:  Headache. Metastatic prostate cancer. Left frontal meningioma.  EXAM: MRI HEAD WITHOUT CONTRAST  MRA HEAD WITHOUT CONTRAST  TECHNIQUE: Multiplanar, multiecho pulse sequences of the brain and surrounding structures were obtained without intravenous contrast. Angiographic images of the head were obtained using MRA technique without contrast.  COMPARISON:  Head CT same day.  MRI 09/18/2014.  FINDINGS: MRI HEAD FINDINGS  Diffusion imaging shows a punctate focus of restricted diffusion in the left frontal white matter consistent with a small white matter infarction. No mass effect or hemorrhage.  The brainstem and cerebellum are normal. The cerebral hemispheres again show atrophy with chronic small-vessel ischemic changes throughout the white matter. Focal encephalomalacia and gliosis is present in the medial posterior frontal region bilaterally related to the sites  of previous meningioma resections. No evidence of residual or recurrent tumor at this site of the resections. Third small meningioma along the falx projecting more towards the right has enlarged, measuring 10 mm in diameter as opposed 8 mm previously. No evidence of hydrocephalus or extra-axial collection. Flow persists in the sagittal sinus. Fluid again noted in the mastoid air cells bilaterally.  MRA HEAD FINDINGS  Both internal carotid arteries are widely patent into  the brain. The anterior and middle cerebral vessels are normal without proximal stenosis, aneurysm or vascular malformation. Both vertebral arteries are widely patent to the basilar. No basilar stenosis. Posterior circulation branch vessels appear normal.  IMPRESSION: Punctate acute white matter infarction in the left frontal lobe. No swelling or mass effect.  No evidence of residual or recurrent tumor at this site of meningioma resection bilaterally along the falx.  Interval growth of a third falcine meningioma, increased in size from 8 mm 10 mm over the last 4 months.  Normal intracranial MR angiography of the large and medium size vessels appear   Electronically Signed   By: Nelson Chimes M.D.   On: 01/26/2015 12:42    Medications: I have reviewed the patient's current medications.  Assessment/Plan:  1. 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, status post resection 04/06/2014  Staging CTs of the chest, abdomen, and pelvis 04/12/2014 confirmed a prostate mass  Prostate ultrasound 04/16/2014 confirmed a prostate mass with a biopsy consistent with small cell carcinoma  Extrinsic appearing cecal mass confirmed on colonoscopy 04/19/2014 with a biopsy revealing small cell carcinoma  Whole brain and prostate radiation 04/24/2014 through 05/16/2014  Cycle 1 carboplatin/etoposide beginning 05/22/2014 with Neulasta support  Cycle 2 carboplatin/etoposide 06/19/2014  Cycle 3 carboplatin/etoposide 07/17/2014  Restaging CT scans 08/10/2014 with near-complete resolution of the large mass involving the prostate gland; resolution of inferior perirectal and right ileocolonic mesenteric lymph nodes; progressive right internal iliac/presacral adenopathy and new small bilateral adrenal nodules; persistent soft tissue nodule involving the medial wall of the cecum.  Cycle 4 carboplatin/etoposide 08/14/2014  MRI brain 09/18/2014 with interval collapse of the bilateral posterior  frontal resection cavities. Minimal enhancement at the right frontal resection site has decreased. Increased enhancement along the margins of the left frontal resection cavity favored to be postoperative. Small amount of new non-mass-like enhancement more posteriorly in the paramedian right frontoparietal region likely postoperative/post ischemic. Decreased size of 7 mm falcine mass. No evidence of new intracranial metastases.  cycle 5 carboplatin/etoposide 09/24/2014  CTs 10/10/2014 revealed enlargement of bilateral adrenal nodules, no other evidence of disease progression  CT 12/27/2014 with enlargement of adrenal metastases, a new peritoneal implant, and a recurrent iliac node   Cycle 1 temozolomide 01/11/2015 2. Left frontal meningioma, WHO grade 1, status post resection 04/06/2014 3. Left leg weakness secondary to #1 4. 1 cmbrain lesion at the inferior falx noted on the MRI 03/21/2014, not resected 5. Diabetes  6. Hypertension  7. Hyperlipidemia 8. Neutropenia following cycle 1 carboplatin/etoposide.  Ivan Stark was admitted with a headache and progressive "weakness ". His family is not present this morning and he is unable to give detailed history. He appears comfortable and there is no clinical or radiologic evidence of disease progression. However I suspect the decline in his performance status may be in part secondary to progression of the metastatic small cell carcinoma. It is also possible his clinical decline is related to brain surgery and radiation.  It is too early to judge the effectiveness of the  temozolomide. He appears to have tolerated the temozolomide well.  Recommendations: 1. Physical therapy evaluation and decide on the need for skilled nursing facility placement 2. We will arrange for outpatient follow-up at the [facility] on 02/07/2015 prior to the next scheduled cycle of temozolomide. 3. I will check on him again 01/29/2015   LOS: 2 days   Ivan Stark,  Dominica Severin  01/28/2015, 12:11 PM

## 2015-01-28 NOTE — Progress Notes (Signed)
  Echocardiogram 2D Echocardiogram has been performed.  Ivan Stark 01/28/2015, 5:11 PM

## 2015-01-28 NOTE — Progress Notes (Signed)
[facility] PROGRESS NOTE  Ivan Stark BTD:[id number] DOB: Feb 07, 1946 DOA: 01/26/2015 PCP: Alesia Richards, MD  Assessment/Plan:  Principal Problem:   Acute ischemic stoke: carotid doppler without significant stenosis. Echo pending. To SNF tomorrow if stable. Patient agreeable.  Weakness in large part due to progressive cancer.  Discussed with Dr. Benay Spice.  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   Objective: Filed Vitals:   01/28/15 0958  BP: 110/50  Pulse: 67  Temp: 98 F (36.7 C)  Resp: 20    Intake/Output Summary (Last 24 hours) at 01/28/15 1320 Last data filed at 01/28/15 1300  Gross per 24 hour  Intake    240 ml  Output   1550 ml  Net  -1310 ml   Filed Weights   01/26/15 0321  Weight: 79.379 kg (175 lb)    Exam:   General:  A and o  Cardiovascular: RRR without MGR  Respiratory: CTA without WRR  Abdomen: S, NT, ND  Ext: no CCE  Neuro:  Motor 5/5  Basic Metabolic Panel:  Recent Labs Lab 01/26/15 0335 01/26/15 0832  NA 140 138  K 3.8 4.1  CL 107 109  CO2  --  24  GLUCOSE 137* 179*  BUN 15 16  CREATININE 0.90 0.79  CALCIUM  --  9.0   Liver Function Tests:  Recent Labs Lab 01/26/15 0832  AST 20  ALT 19  ALKPHOS 115  BILITOT 0.8  PROT 6.4*  ALBUMIN 3.3*   No results for input(s): LIPASE, AMYLASE in the last 168 hours. No results for input(s): AMMONIA in the last 168 hours. CBC:  Recent Labs Lab 01/26/15 0331 01/26/15 0335  WBC 5.5  --   NEUTROABS 3.8  --   HGB 13.5 14.6  HCT 41.7 43.0  MCV 89.7  --   PLT 230  --    Cardiac Enzymes: No results for input(s): CKTOTAL, CKMB, CKMBINDEX, TROPONINI in the last 168 hours. BNP (last 3 results) No results for input(s): BNP in  the last 8760 hours.  ProBNP (last 3 results) No results for input(s): PROBNP in the last 8760 hours.  CBG:  Recent Labs Lab 01/27/15 1221 01/27/15 1630 01/27/15 2115 01/28/15 0638 01/28/15 1109  GLUCAP 223* 208* 71 118* 149*    No results found for this or any previous visit (from the past 240 hour(s)).   Studies: Mr Thoracic Spine Wo Contrast  01/26/2015   CLINICAL DATA:  Progressive left lower extremity weakness, unexplained. Progressive worsening of symptoms. Multiple falls.  EXAM: MRI THORACIC SPINE WITHOUT CONTRAST  TECHNIQUE: Multiplanar, multisequence MR imaging of the thoracic spine was performed. No intravenous contrast was administered.  COMPARISON:  Lumbar exam same day.  CT chest 12/27/2014  FINDINGS: There is an acute compression fracture at T2 affecting the inferior endplate. There is loss of height anteriorly of 50%. No retropulsed bone or traumatic disc herniation. Very minimal narrowing of the ventral subarachnoid space but no compression of the cord. No abnormal cord signal. No finding to suggest that this represents anything other than a benign osteoporotic fracture.  No other fracture seen in the thoracic region. No marrow space signal to suggest osseous metastatic disease elsewhere.  No abnormal cord signal anywhere in the region.  There are  minor degenerative changes in the thoracic spine, none sufficient to result in neurological symptoms. Tiny central disc protrusion at T7-8 without neural compression.  Thyroid goiter and bilateral adrenal masses incidentally demonstrated, as seen on other more appropriate imaging modalities.  IMPRESSION: Acute inferior endplate T2 compression fracture with loss of height of 50% anteriorly. This was not present on a CT scan of the chest dated 07/21. No canal compromise. No abnormal cord signal. No finding to suggest that this represents anything other than a benign osteoporotic fracture. No other finding of significance in the thoracic  region.   Electronically Signed   By: Nelson Chimes M.D.   On: 01/26/2015 19:13   Mr Lumbar Spine Wo Contrast  01/26/2015   CLINICAL DATA:  Left sided leg weakness with progressively worsening with gait disturbance. Multiple falls.  EXAM: MRI LUMBAR SPINE WITHOUT CONTRAST  TECHNIQUE: Multiplanar, multisequence MR imaging of the lumbar spine was performed. No intravenous contrast was administered.  COMPARISON:  None.  FINDINGS: There is no abnormality at L3-4 or above. The discs are normal. The canal and foramina are widely patent. The distal cord and conus are normal with the conus tip at L1.  L4-5: Mild desiccation and bulging of the disc. Mild facet hypertrophy. No canal or foraminal stenosis.  L5-S1: Mild bulging of the disc. Mild facet hypertrophy. No canal or foraminal stenosis.  IMPRESSION: No significant finding. Mild degenerative disc disease and degenerative facet disease at L4-5 and L5-S1. The findings could be associated with back pain, but there is no stenosis or visible neural compression.   Electronically Signed   By: Nelson Chimes M.D.   On: 01/26/2015 13:33    Scheduled Meds: . aspirin  300 mg Rectal Daily   Or  . aspirin  325 mg Oral Daily  . enoxaparin (LOVENOX) injection  40 mg Subcutaneous Q24H  . insulin aspart  0-15 Units Subcutaneous TID WC  . insulin aspart  0-5 Units Subcutaneous QHS  . lactulose  10 g Oral BID  . polyethylene glycol  17 g Oral Daily  . senna  2 tablet Oral BID   Continuous Infusions:   Time spent: 25 minutes  [facility]  [email or web address], password [facility] 01/28/2015, 1:20 PM  LOS: 2 days

## 2015-01-28 NOTE — Clinical Social Work Note (Signed)
Clinical Social Work Assessment  Patient Details  Name: Ivan Stark MRN: [id number] Date of Birth: 1946-01-22  Date of referral:  01/28/15               Reason for consult:  Facility Placement                Permission sought to share information with:  Family Supports Permission granted to share information::  Yes, Verbal Permission Granted  Name::     Ivan Stark  Relationship::  wife   Contact Information:  [phone]  Housing/Transportation Living arrangements for the past 2 months:  Single Family Home Source of Information:  Patient, Spouse Patient Interpreter Needed:  None Criminal Activity/Legal Involvement Pertinent to Current Situation/Hospitalization:  No - Comment as needed Significant Relationships:  Spouse Lives with:  Spouse Do you feel safe going back to the place where you live?  No Need for family participation in patient care:  Yes (Comment)  Care giving concerns: Ivan Stark expressed concerns with the pt's ablility to walk independently.   Social Worker assessment / plan: CSW met the pt and the pt's wife Ivan Stark at bedside. CSW introduced self and purpose of the visit. CSW discussed SNF rehab. CSW explained the SNF process. CSW explained insurance and its relation to SNF placement. CSW provided the pt with a SNF list. Ivan Stark requested to speak with the MD. CSW was able to inform the MD about Ivan Stark requested. CSW answered all questions in which the pt/[name] inquired about. CSW will continue to follow this pt and assist with discharge as needed.   Insurance information:  Managed Medicare PT Recommendations:  [name] / Referral to community resources:  [name]  Patient/Family's Response to care:  The pt denied any complaint about the care in which he has received.   Patient/Family's Understanding of and Emotional Response to Diagnosis, Current Treatment, and Prognosis: Ivan Stark reported being confused about the  pt's medical status. CSW paged the MD regarding the wife confusion.    Emotional Assessment Appearance:  Appears stated age Attitude/Demeanor/Rapport:   (Calm ) Affect (typically observed):  Accepting Orientation:  Oriented to Self, Oriented to Place, Oriented to  Time, Oriented to Situation Alcohol / Substance use:  Not Applicable Psych involvement (Current and /or in the community):  No (Comment)  Discharge Needs  Concerns to be addressed:  Denies Needs/Concerns at this time Readmission within the last 30 days:  No Current discharge risk:  None Barriers to Discharge:  No Barriers Identified   Ivan Cuadros, LCSW 01/28/2015, 1:09 PM

## 2015-01-29 ENCOUNTER — Other Ambulatory Visit: Payer: Self-pay | Admitting: *Deleted

## 2015-01-29 DIAGNOSIS — E1140 Type 2 diabetes mellitus with diabetic neuropathy, unspecified: Secondary | ICD-10-CM

## 2015-01-29 LAB — GLUCOSE, CAPILLARY
GLUCOSE-CAPILLARY: 157 mg/dL — AB (ref 65–99)
Glucose-Capillary: 164 mg/dL — ABNORMAL HIGH (ref 65–99)

## 2015-01-29 MED ORDER — LISINOPRIL 2.5 MG PO TABS: 2.5000 mg | ORAL_TABLET | Freq: Every day | ORAL | Status: DC

## 2015-01-29 MED ORDER — ALPRAZOLAM 0.5 MG PO TABS: 0.5000 mg | ORAL_TABLET | Freq: Two times a day (BID) | ORAL | Status: DC | PRN

## 2015-01-29 MED ORDER — GLIMEPIRIDE 4 MG PO TABS: 2.0000 mg | ORAL_TABLET | Freq: Two times a day (BID) | ORAL | Status: DC

## 2015-01-29 MED ORDER — ACETAMINOPHEN 325 MG PO TABS: 650.0000 mg | ORAL_TABLET | ORAL | Status: DC | PRN

## 2015-01-29 MED ORDER — ONDANSETRON HCL 4 MG PO TABS: 4.0000 mg | ORAL_TABLET | Freq: Three times a day (TID) | ORAL | Status: AC | PRN

## 2015-01-29 NOTE — Discharge Summary (Signed)
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 male with Past medical history of hypertension, GERD, prostate cancer, cerebral meningioma, status post craniotomy, prior history of left leg weakness . The patient was brought in with the complaints also severe headache. As per the family the patient does not have any headache and his baseline. Even with his prior CNS surgery he did not have any significant pain. Today suddenly he woke up from sleep with severe pain across his head which was a continuous and sharp. The head ache resolved at the time of my evaluation. The patient did not have any blurry visions. Difficulty. Patient has chronic left-sided weakness. Patient also has chronic gait imbalance. As per patient's wife since last one week and his gait imbalance as well as left-sided weakness has been progressively worsening and the patient has multiple fall. Generally the patient has one fall a week but patient has been having 14 every day  since last one week. The patient has recently completed a chemotherapy regimen for his prostate cancer. Patient denies having any nausea vomiting denies having any cough or fever or chills no diarrhea or constipation or active bleeding. Other than the new chemotherapy patient does not have any new changes in his medication.  Hospital Course:  Admitted to telemetry. Neurology consulted. MRI showed Punctate acute white matter infarction in the left frontal lobe. No swelling or mass effect.  No evidence of residual or recurrent tumor at this site of meningioma resection bilaterally along the falx.  Interval growth of a third falcine meningioma, increased in size from 8 mm 10 mm over the last 4 months. Stroke workup was undertaken, though patient's weakness was felt to be related to progressive cancer or treatment, possibly paraneoplastic process. Patient was on aspirin prior to admission which will be continued. Carotid Dopplers showed no evidence of critical stenosis. Echocardiogram showed no source of embolus. However, ejection fraction was 45-50%. In April, ejection fraction was normal. No evidence of heart failure, but patient was placed on low-dose lisinopril given the slightly low ejection fraction as well as diabetes. He may resume his oral hypoglycemic agents. LDL was found to be elevated at 155. However, patient is intolerant to Lipitor. Given overall weakness, guarded prognosis, neurology recommends against starting an alternate statin as it could worsen his weakness. Hemoglobin A1c is 7.0 and I have not adjusted his diabetic regimen. He has worked with physical therapy occupational therapy and skilled nursing facility is recommended. Dr. Benay Spice also made a social visit and agrees that weakness could be related to progressive cancer. He has scheduled a visit for September 1 prior to his next  round of chemotherapy. Prognosis was discussed multiple times with patient's wife who had a lot of questions.  All consultants as well as myself answered her many questions. Neurology recommends considering a repeat MRI with and without contrast of the brain in 2-4 weeks to see whether  evolution of stroke is present versus progression of tumor.  Procedures:  none  Consultations:  Neurology  oncology  Discharge Exam: Filed Vitals:   01/29/15 0546  BP: 120/64  Pulse: 56  Temp: 98.6 F (37 C)  Resp: 18    General: alert in chair. Bright and comfortable. Cardiovascular: regular rate rhythm without murmurs gallops rubs Respiratory: clear to auscultation bilaterally without wheezes rhonchi or rales Neurologic: Cranial nerves intact. Oriented. Motor 5 out of 5.  Discharge Instructions    Current Discharge Medication List    START taking these medications   Details  acetaminophen (TYLENOL) 325 MG tablet Take 2 tablets (650 mg total) by mouth every 4 (four) hours as needed for mild pain (or temp >/= 99.5 F).    lisinopril (ZESTRIL) 2.5 MG tablet Take 1 tablet (2.5 mg total) by mouth daily.      CONTINUE these medications which have CHANGED   Details  ALPRAZolam (XANAX) 0.5 MG tablet Take 1 tablet (0.5 mg total) by mouth 2 (two) times daily as needed for anxiety. Qty: 10 tablet, Refills: 0   Associated Diagnoses: Small cell carcinoma of prostate; Brain metastases    glimepiride (AMARYL) 4 MG tablet Take 0.5 tablets (2 mg total) by mouth 2 (two) times daily. Qty: 60 tablet, Refills: 1    ondansetron (ZOFRAN) 4 MG tablet Take 1 tablet (4 mg total) by mouth every 8 (eight) hours as needed for nausea or vomiting. Take prior to Temodar      CONTINUE these medications which have NOT CHANGED   Details  Ascorbic Acid (VITAMIN C) 1000 MG tablet Take 1,000 mg by mouth daily.    aspirin EC 325 MG EC tablet Take 1 tablet (325 mg total) by mouth daily. Qty: 30 tablet, Refills: 0    Cholecalciferol (VITAMIN D) 2000 UNITS CAPS Take 4,000 Units by mouth daily.     GLUCERNA (GLUCERNA) LIQD  Take 237 mLs by mouth 2 (two) times daily between meals.     Lactulose 20 GM/30ML SOLN Take 15 mLs (10 g total) by mouth 2 (two) times daily. Qty: 946 mL, Refills: 0    metFORMIN (GLUCOPHAGE-XR) 500 MG 24 hr tablet TAKE ONE TABLET BY MOUTH TWICE DAILY WITH BREAKFAST AND LUNCH Qty: 60 tablet, Refills: 0    Multiple Vitamin (MULTIVITAMIN WITH MINERALS) TABS Take 1 tablet by mouth daily.    polyethylene glycol (MIRALAX / GLYCOLAX) packet Take 17 g by mouth daily.    senna (SENOKOT) 8.6 MG TABS tablet Take 2 tablets (17.2 mg total) by mouth 2 (two) times daily. For constipation. Adjust as  needed Qty: 120 each, Refills: 0      STOP taking these medications     temozolomide (TEMODAR) 140 MG capsule        Allergies  Allergen Reactions  . Atenolol Shortness Of Breath  . Lipitor [Atorvastatin]     Itch   Follow-up Information    Follow up with Alesia Richards, MD.   Specialty:  Internal Medicine   Contact information:   [address] [place] [address] [phone]       Follow up with Betsy Coder, MD.   Specialty:  Oncology   Contact information:  [place] [address] [phone]        The results of significant diagnostics from this hospitalization (including imaging, microbiology, ancillary and laboratory) are listed below for reference.    Significant Diagnostic Studies: Dg Chest 2 View  01/26/2015   CLINICAL DATA:  Sudden onset headache.  Weakness and cancer.  EXAM: CHEST  2 VIEW  COMPARISON:  Chest CT 12/27/2014  FINDINGS: Normal heart size and stable mediastinal contours, including lower mediastinal widening from hiatal hernia. There is no edema, consolidation, effusion, or pneumothorax.  IMPRESSION: No active cardiopulmonary disease.   Electronically Signed   By: Monte Fantasia M.D.   On: 01/26/2015 05:36   Ct Head Wo Contrast  01/26/2015   CLINICAL DATA:  Headache and history of brain cancer. Metastatic  prostate cancer and history of left frontal meningioma.  EXAM: CT HEAD WITHOUT CONTRAST  TECHNIQUE: Contiguous axial images were obtained from the base of the skull through the vertex without intravenous contrast.  COMPARISON:  09/25/2014  FINDINGS: Skull and Sinuses:Stable appearance of craniotomy at the vertex.  Interval decrease in right mastoid effusion, now layering in the mastoid tip. Small effusion also present in the left mastoid tip.  Orbits: No acute abnormality.  Brain: No evidence of acute infarction, hemorrhage, hydrocephalus, or mass lesion/mass effect.  Stable parasagittal posterior bifrontal gliosis at resected masses. A 7 mm fallcine mass noted on brain MRI April 2016 is not clearly visible. No evidence of new intracranial metastatic disease.  Chronic small vessel disease with ischemic gliosis confluent throughout the bilateral cerebral white matter. Generalized cortical atrophy.  IMPRESSION: 1. No acute intracranial finding. 2. Chronic small vessel disease. 3. Stable post treatment changes at the vertex related to meningioma and metastasis resections. 4. Bilateral mastoid effusions, decreased on the right since April 2016.   Electronically Signed   By: Monte Fantasia M.D.   On: 01/26/2015 04:16   Mr Brain Wo Contrast  01/26/2015   CLINICAL DATA:  Headache. Metastatic prostate cancer. Left frontal meningioma.  EXAM: MRI HEAD WITHOUT CONTRAST  MRA HEAD WITHOUT CONTRAST  TECHNIQUE: Multiplanar, multiecho pulse sequences of the brain and surrounding structures were obtained without intravenous contrast. Angiographic images of the head were obtained using MRA technique without contrast.  COMPARISON:  Head CT same day.  MRI 09/18/2014.  FINDINGS: MRI HEAD FINDINGS  Diffusion imaging shows a punctate focus of restricted diffusion in the left frontal white matter consistent with a small white matter infarction. No mass effect or hemorrhage.  The brainstem and cerebellum are normal. The cerebral  hemispheres again show atrophy with chronic small-vessel ischemic changes throughout the white matter. Focal encephalomalacia and gliosis is present in the medial posterior frontal region bilaterally related to the sites of previous meningioma resections. No evidence of residual or recurrent tumor at this site of the resections. Third small meningioma along the falx projecting more towards the right has enlarged, measuring 10 mm in diameter as opposed 8 mm previously. No evidence of hydrocephalus or extra-axial collection. Flow persists in the sagittal sinus. Fluid again noted in the mastoid air cells bilaterally.  MRA HEAD FINDINGS  Both internal carotid arteries are widely patent into the brain. The anterior and middle cerebral vessels are normal without proximal stenosis, aneurysm or vascular malformation. Both vertebral arteries are widely patent to the basilar. No basilar stenosis. Posterior circulation branch vessels appear normal.  IMPRESSION: Punctate acute white matter infarction in the left frontal lobe. No swelling or mass effect.  No evidence  of residual or recurrent tumor at this site of meningioma resection bilaterally along the falx.  Interval growth of a third falcine meningioma, increased in size from 8 mm 10 mm over the last 4 months.  Normal intracranial MR angiography of the large and medium size vessels appear   Electronically Signed   By: Nelson Chimes M.D.   On: 01/26/2015 12:42   Mr Thoracic Spine Wo Contrast  01/26/2015   CLINICAL DATA:  Progressive left lower extremity weakness, unexplained. Progressive worsening of symptoms. Multiple falls.  EXAM: MRI THORACIC SPINE WITHOUT CONTRAST  TECHNIQUE: Multiplanar, multisequence MR imaging of the thoracic spine was performed. No intravenous contrast was administered.  COMPARISON:  Lumbar exam same day.  CT chest 12/27/2014  FINDINGS: There is an acute compression fracture at T2 affecting the inferior endplate. There is loss of height anteriorly  of 50%. No retropulsed bone or traumatic disc herniation. Very minimal narrowing of the ventral subarachnoid space but no compression of the cord. No abnormal cord signal. No finding to suggest that this represents anything other than a benign osteoporotic fracture.  No other fracture seen in the thoracic region. No marrow space signal to suggest osseous metastatic disease elsewhere.  No abnormal cord signal anywhere in the region.  There are minor degenerative changes in the thoracic spine, none sufficient to result in neurological symptoms. Tiny central disc protrusion at T7-8 without neural compression.  Thyroid goiter and bilateral adrenal masses incidentally demonstrated, as seen on other more appropriate imaging modalities.  IMPRESSION: Acute inferior endplate T2 compression fracture with loss of height of 50% anteriorly. This was not present on a CT scan of the chest dated 07/21. No canal compromise. No abnormal cord signal. No finding to suggest that this represents anything other than a benign osteoporotic fracture. No other finding of significance in the thoracic region.   Electronically Signed   By: Nelson Chimes M.D.   On: 01/26/2015 19:13   Mr Lumbar Spine Wo Contrast  01/26/2015   CLINICAL DATA:  Left sided leg weakness with progressively worsening with gait disturbance. Multiple falls.  EXAM: MRI LUMBAR SPINE WITHOUT CONTRAST  TECHNIQUE: Multiplanar, multisequence MR imaging of the lumbar spine was performed. No intravenous contrast was administered.  COMPARISON:  None.  FINDINGS: There is no abnormality at L3-4 or above. The discs are normal. The canal and foramina are widely patent. The distal cord and conus are normal with the conus tip at L1.  L4-5: Mild desiccation and bulging of the disc. Mild facet hypertrophy. No canal or foraminal stenosis.  L5-S1: Mild bulging of the disc. Mild facet hypertrophy. No canal or foraminal stenosis.  IMPRESSION: No significant finding. Mild degenerative disc  disease and degenerative facet disease at L4-5 and L5-S1. The findings could be associated with back pain, but there is no stenosis or visible neural compression.   Electronically Signed   By: Nelson Chimes M.D.   On: 01/26/2015 13:33   Mr Jodene Nam Head/brain Wo Cm  01/26/2015   CLINICAL DATA:  Headache. Metastatic prostate cancer. Left frontal meningioma.  EXAM: MRI HEAD WITHOUT CONTRAST  MRA HEAD WITHOUT CONTRAST  TECHNIQUE: Multiplanar, multiecho pulse sequences of the brain and surrounding structures were obtained without intravenous contrast. Angiographic images of the head were obtained using MRA technique without contrast.  COMPARISON:  Head CT same day.  MRI 09/18/2014.  FINDINGS: MRI HEAD FINDINGS  Diffusion imaging shows a punctate focus of restricted diffusion in the left frontal white matter consistent with a small  white matter infarction. No mass effect or hemorrhage.  The brainstem and cerebellum are normal. The cerebral hemispheres again show atrophy with chronic small-vessel ischemic changes throughout the white matter. Focal encephalomalacia and gliosis is present in the medial posterior frontal region bilaterally related to the sites of previous meningioma resections. No evidence of residual or recurrent tumor at this site of the resections. Third small meningioma along the falx projecting more towards the right has enlarged, measuring 10 mm in diameter as opposed 8 mm previously. No evidence of hydrocephalus or extra-axial collection. Flow persists in the sagittal sinus. Fluid again noted in the mastoid air cells bilaterally.  MRA HEAD FINDINGS  Both internal carotid arteries are widely patent into the brain. The anterior and middle cerebral vessels are normal without proximal stenosis, aneurysm or vascular malformation. Both vertebral arteries are widely patent to the basilar. No basilar stenosis. Posterior circulation branch vessels appear normal.  IMPRESSION: Punctate acute white matter infarction  in the left frontal lobe. No swelling or mass effect.  No evidence of residual or recurrent tumor at this site of meningioma resection bilaterally along the falx.  Interval growth of a third falcine meningioma, increased in size from 8 mm 10 mm over the last 4 months.  Normal intracranial MR angiography of the large and medium size vessels appear   Electronically Signed   By: Nelson Chimes M.D.   On: 01/26/2015 12:42   Transthoracic echocardiogram Left ventricle: Systolic function was mildly reduced. The estimated ejection fraction was in the range of 45% to 50%. Although no diagnostic regional wall motion abnormality was identified, this possibility cannot be completely excluded on the basis of this study. Doppler parameters are consistent with abnormal left ventricular relaxation (grade 1 diastolic dysfunction).  Carotid Dopplers The vertebral arteries appear patent with antegrade flow. - Findings consistent with 1-39 percent stenosis involving the right internal carotid artery and the left internal carotid artery  Microbiology: No results found for this or any previous visit (from the past 240 hour(s)).   Labs: Basic Metabolic Panel:  Recent Labs Lab 01/26/15 0335 01/26/15 0832  NA 140 138  K 3.8 4.1  CL 107 109  CO2  --  24  GLUCOSE 137* 179*  BUN 15 16  CREATININE 0.90 0.79  CALCIUM  --  9.0   Liver Function Tests:  Recent Labs Lab 01/26/15 0832  AST 20  ALT 19  ALKPHOS 115  BILITOT 0.8  PROT 6.4*  ALBUMIN 3.3*   No results for input(s): LIPASE, AMYLASE in the last 168 hours. No results for input(s): AMMONIA in the last 168 hours. CBC:  Recent Labs Lab 01/26/15 0331 01/26/15 0335  WBC 5.5  --   NEUTROABS 3.8  --   HGB 13.5 14.6  HCT 41.7 43.0  MCV 89.7  --   PLT 230  --    Cardiac Enzymes: No results for input(s): CKTOTAL, CKMB, CKMBINDEX, TROPONINI in the last 168 hours. BNP: BNP (last 3 results) No results for input(s): BNP in  the last 8760 hours.  ProBNP (last 3 results) No results for input(s): PROBNP in the last 8760 hours.  CBG:  Recent Labs Lab 01/28/15 0638 01/28/15 1109 01/28/15 1630 01/28/15 2121 01/29/15 0635  GLUCAP 118* 149* 120* 165* 164*       Signed:  Hanley Woerner L  [facility] 01/29/2015, 8:29 AM

## 2015-01-29 NOTE — Clinical Social Work Note (Signed)
Clinical Social Worker facilitated patient discharge including contacting patient family and facility to confirm patient discharge plans.  Clinical information faxed to facility and family agreeable with plan.  CSW arranged ambulance transport via [facility] to [facility].  RN to call report prior to discharge.  DC packet prepared and on chart for transport with number for report.  [employer] authorization obtained [phone]  Clinical Social Worker will sign off for now as social work intervention is no longer needed. Please consult [place] again if new need arises.  Glendon Axe, MSW, LCSWA [phone] 01/29/2015 10:07 AM

## 2015-01-29 NOTE — Progress Notes (Signed)
Physical Therapy Treatment Patient Details Name: Ivan Stark MRN: [id number] DOB: 1945-09-12 Today's Date: 01/29/2015    History of Present Illness TATEN MERROW is a 69 y.o. male with past medical history of hypertension, GERD, prostate cancer, cerebral meningioma, status post craniotomy 03/2014, and prior history of left leg weakness. He was admitted for severe headache and progressive weakness.    PT Comments    Patient progressing well with ambulation this session. Chair follow is helpful as he tends to need a sitting rest break. Patient discussed his prior falls and concerns over those. He is planning to go to SNF for ongoing rehab prior to returning home.   Follow Up Recommendations  Supervision/Assistance - 24 hour;SNF     Equipment Recommendations  None recommended by PT    Recommendations for Other Services       Precautions / Restrictions Precautions Precautions: Fall;Other (comment) Precaution Comments: Patient report multiple falls per day pta    Mobility  Bed Mobility               General bed mobility comments: Patient up in recliner before and after session  Transfers Overall transfer level: Needs assistance Equipment used: Rolling walker (2 wheeled)   Sit to Stand: Mod assist         General transfer comment: Mod A to power up into standing. Cues for safe hand placement and technique  Ambulation/Gait Ambulation/Gait assistance: Min assist;+2 safety/equipment Ambulation Distance (Feet): 75 Feet (x2) Assistive device: Rolling walker (2 wheeled) Gait Pattern/deviations: Step-through pattern;Decreased stride length Gait velocity: decreased   General Gait Details: decreased foot clearance LLE   Stairs            Wheelchair Mobility    Modified Rankin (Stroke Patients Only)       Balance Overall balance assessment: Needs assistance;History of Falls   Sitting balance-Leahy Scale: Good       Standing balance-Leahy Scale:  Poor                      Cognition Arousal/Alertness: Awake/alert Behavior During Therapy: WFL for tasks assessed/performed Overall Cognitive Status: No family/caregiver present to determine baseline cognitive functioning       Memory: Decreased short-term memory              Exercises      General Comments        Pertinent Vitals/Pain Pain Assessment: No/denies pain    Home Living                      Prior Function            PT Goals (current goals can now be found in the care plan section) Progress towards PT goals: Progressing toward goals    Frequency  Min 3X/week    PT Plan Current plan remains appropriate    Co-evaluation             End of Session   Activity Tolerance: Patient tolerated treatment well Patient left: in chair;with call bell/phone within reach;with chair alarm set     Time: 3338-3291 PT Time Calculation (min) (ACUTE ONLY): 23 min  Charges:  $Gait Training: 8-22 mins $Therapeutic Activity: 8-22 mins                    G Codes:      Jacqualyn Posey 01/29/2015, 10:41 AM  01/29/2015 Jacqualyn Posey PTA [phone] pager [phone] office

## 2015-01-29 NOTE — Clinical Social Work Placement (Signed)
   CLINICAL SOCIAL WORK PLACEMENT  NOTE  Date:  01/29/2015  Patient Details  Name: Ivan Stark MRN: [id number] Date of Birth: 1945-06-19  Clinical Social Work is seeking post-discharge placement for this patient at the [place] level of care (*CSW will initial, date and re-position this form in  chart as items are completed):  Yes   Patient/family provided with [employer] Work Department's list of facilities offering this level of care within the geographic area requested by the patient (or if unable, by the patient's family).  Yes   Patient/family informed of their freedom to choose among providers that offer the needed level of care, that participate in Medicare, Medicaid or managed care program needed by the patient, have an available bed and are willing to accept the patient.  Yes   Patient/family informed of [facility]'s ownership interest in [facility] and [facility], as well as of the fact that they are under no obligation to receive care at these facilities.  PASRR submitted to EDS on 01/28/15     PASRR number received on 01/28/15     Existing PASRR number confirmed on       FL2 transmitted to all facilities in geographic area requested by pt/family on 01/28/15     FL2 transmitted to all facilities within larger geographic area on       Patient informed that his/her managed care company has contracts with or will negotiate with certain facilities, including the following:        Yes   Patient/family informed of bed offers received.  Patient chooses bed at  [facility] )     Physician recommends and patient chooses bed at      Patient to be transferred to  [facility] ) on 01/29/15.  Patient to be transferred to facility by  [facility] )     Patient family notified on 01/29/15 of transfer.  Name of family member notified:   (Pt's spouse, Ivan Stark )     PHYSICIAN Please prepare  prescriptions     Additional Comment:    _______________________________________________ Glendon Axe, MSW, [place] [phone] 01/29/2015 9:06 AM

## 2015-01-29 NOTE — Progress Notes (Signed)
Report given to Nurse Olivia Mackie at [facility]. Family aware. All belonging sent with family. Awaiting for transport.   Ave Filter, RN

## 2015-02-01 ENCOUNTER — Other Ambulatory Visit: Payer: Self-pay | Admitting: Nurse Practitioner

## 2015-02-01 ENCOUNTER — Encounter (HOSPITAL_COMMUNITY): Payer: Self-pay | Admitting: Emergency Medicine

## 2015-02-01 ENCOUNTER — Emergency Department (HOSPITAL_COMMUNITY)
Admission: EM | Admit: 2015-02-01 | Discharge: 2015-02-01 | Disposition: A | Discharge status: Home / Self Care | Payer: Medicare Other | Attending: Emergency Medicine | Admitting: Emergency Medicine

## 2015-02-01 ENCOUNTER — Emergency Department (HOSPITAL_COMMUNITY): Payer: Medicare Other

## 2015-02-01 DIAGNOSIS — E119 Type 2 diabetes mellitus without complications: Secondary | ICD-10-CM | POA: Diagnosis not present

## 2015-02-01 DIAGNOSIS — R110 Nausea: Secondary | ICD-10-CM | POA: Insufficient documentation

## 2015-02-01 DIAGNOSIS — R51 Headache: Secondary | ICD-10-CM | POA: Insufficient documentation

## 2015-02-01 DIAGNOSIS — I10 Essential (primary) hypertension: Secondary | ICD-10-CM | POA: Diagnosis not present

## 2015-02-01 DIAGNOSIS — Z79899 Other long term (current) drug therapy: Secondary | ICD-10-CM | POA: Diagnosis not present

## 2015-02-01 DIAGNOSIS — Z9889 Other specified postprocedural states: Secondary | ICD-10-CM | POA: Diagnosis not present

## 2015-02-01 DIAGNOSIS — Z7982 Long term (current) use of aspirin: Secondary | ICD-10-CM | POA: Diagnosis not present

## 2015-02-01 DIAGNOSIS — Z86010 Personal history of colonic polyps: Secondary | ICD-10-CM | POA: Diagnosis not present

## 2015-02-01 DIAGNOSIS — Z8546 Personal history of malignant neoplasm of prostate: Secondary | ICD-10-CM | POA: Insufficient documentation

## 2015-02-01 DIAGNOSIS — Z8719 Personal history of other diseases of the digestive system: Secondary | ICD-10-CM | POA: Insufficient documentation

## 2015-02-01 DIAGNOSIS — R519 Headache, unspecified: Secondary | ICD-10-CM

## 2015-02-01 DIAGNOSIS — E559 Vitamin D deficiency, unspecified: Secondary | ICD-10-CM | POA: Diagnosis not present

## 2015-02-01 LAB — BASIC METABOLIC PANEL
ANION GAP: 7 (ref 5–15)
BUN: 13 mg/dL (ref 6–20)
CALCIUM: 9.3 mg/dL (ref 8.9–10.3)
CO2: 25 mmol/L (ref 22–32)
Chloride: 106 mmol/L (ref 101–111)
Creatinine, Ser: 0.87 mg/dL (ref 0.61–1.24)
GFR calc Af Amer: >60 mL/min (ref >60)
GFR calc non Af Amer: >60 mL/min (ref >60)
GLUCOSE: 109 mg/dL — AB (ref 65–99)
Potassium: 4.1 mmol/L (ref 3.5–5.1)
Sodium: 138 mmol/L (ref 135–145)

## 2015-02-01 LAB — CBC WITH DIFFERENTIAL/PLATELET
BASOS ABS: 0 10*3/uL (ref 0.0–0.1)
Basophils Relative: 0 % (ref 0–1)
Eosinophils Absolute: 0.1 10*3/uL (ref 0.0–0.7)
Eosinophils Relative: 1 % (ref 0–5)
HEMATOCRIT: 42.7 % (ref 39.0–52.0)
Hemoglobin: 13.9 g/dL (ref 13.0–17.0)
LYMPHS PCT: 15 % (ref 12–46)
Lymphs Abs: 1 10*3/uL (ref 0.7–4.0)
MCH: 29.6 pg (ref 26.0–34.0)
MCHC: 32.6 g/dL (ref 30.0–36.0)
MCV: 90.9 fL (ref 78.0–100.0)
MONO ABS: 0.4 10*3/uL (ref 0.1–1.0)
MONOS PCT: 6 % (ref 3–12)
NEUTROS ABS: 5.4 10*3/uL (ref 1.7–7.7)
Neutrophils Relative %: 78 % — ABNORMAL HIGH (ref 43–77)
Platelets: 232 10*3/uL (ref 150–400)
RBC: 4.7 MIL/uL (ref 4.22–5.81)
RDW: 15 % (ref 11.5–15.5)
WBC: 6.9 10*3/uL (ref 4.0–10.5)

## 2015-02-01 MED ORDER — HYDROCODONE-ACETAMINOPHEN 5-325 MG PO TABS
1.0000 | ORAL_TABLET | Freq: Once | ORAL | Status: AC
  Administered 2015-02-01: 1 via ORAL
  Filled 2015-02-01: qty 1

## 2015-02-01 MED ORDER — MORPHINE SULFATE (PF) 4 MG/ML IV SOLN
4.0000 mg | Freq: Once | INTRAVENOUS | Status: AC
  Administered 2015-02-01: 4 mg via INTRAVENOUS
  Filled 2015-02-01: qty 1

## 2015-02-01 MED ORDER — METOCLOPRAMIDE HCL 5 MG/ML IJ SOLN
10.0000 mg | Freq: Once | INTRAMUSCULAR | Status: AC
  Administered 2015-02-01: 10 mg via INTRAVENOUS
  Filled 2015-02-01: qty 2

## 2015-02-01 MED ORDER — HYDROCODONE-ACETAMINOPHEN 5-325 MG PO TABS: 1.0000 | ORAL_TABLET | ORAL | Status: AC | PRN

## 2015-02-01 MED ORDER — SODIUM CHLORIDE 0.9 % IV BOLUS (SEPSIS)
1000.0000 mL | Freq: Once | INTRAVENOUS | Status: AC
  Administered 2015-02-01: 1000 mL via INTRAVENOUS

## 2015-02-01 NOTE — ED Notes (Addendum)
Coming from [facility] center with complaint of headache. Per EMS pt had an MRI last week with no worsening/new changes, hx of brain cancer. Per EMS they were unable to receive any type of report on the patient from the center. Headache coming from behind left eye, severe, per EMS headaches are new.  Per pt he was admitted to his facility last Friday because they believed he had had a stroke. States he began with this headache last night and it's continuing into today, so they brought him here.

## 2015-02-01 NOTE — ED Notes (Signed)
Called [facility] for estimated arrival time, they stated they would probably get here in another 45 minutes

## 2015-02-01 NOTE — ED Notes (Signed)
[facility] here to take patient at this time.

## 2015-02-01 NOTE — ED Notes (Addendum)
Discharge instructions explained to wife, [facility] called, report given to nurse Mia at facility.

## 2015-02-01 NOTE — ED Provider Notes (Signed)
CSN: [id number]     Arrival date & time 02/01/15  1109 History   First MD Initiated Contact with Patient 02/01/15 1112     Chief Complaint  Patient presents with  . Headache  . Emesis  . Nausea      HPI Patient is currently at a rehabilitation facility aftera history of stroke.  Today he reports increasing headache.  He has no history of significant headaches.  He denies new weakness of his arms or legs.  No fevers or chills.  No neck pain.  Nausea without vomiting.  Denies diarrhea.  Reports the headache is behind both of his eyes.  No history of migraines.  Pain is moderate to severe in severity  Past Medical History  Diagnosis Date  . Hypertension   . Hyperlipidemia   . GERD (gastroesophageal reflux disease)   . Vitamin D deficiency   . History of colon polyps   . Complication of anesthesia     Difficult to wake up; and severe nausea and vomiting  . PONV (postoperative nausea and vomiting)   . Diabetes mellitus without complication     metformin  . Cancer 03/2014    prostate ca   Past Surgical History  Procedure Laterality Date  . Tonsillectomy    . Colonoscopy w/ polypectomy    . Esophagus surgery      Had esophagus stretched due to food getting trapped in his throat  . Craniotomy Bilateral 04/06/2014    Procedure: Bilateral Fronto-parietal craniotomy for resection of tumors with Curve;  Surgeon: Consuella Lose, MD;  Location: [facility];  Service: Neurosurgery;  Laterality: Bilateral;  Bilateral Fronto-parietal craniotomy for resection of tumors with Curve  . Colonoscopy N/A 04/19/2014    Procedure: COLONOSCOPY;  Surgeon: Winfield Cunas., MD;  Location: [facility];  Service: Endoscopy;  Laterality: N/A;   Family History  Problem Relation Age of Onset  . Goiter Mother   . Hypertension Father   . Cancer Father     colon   Social History  Substance Use Topics  . Smoking status: Never Smoker   . Smokeless tobacco: None  . Alcohol Use: No    Review of  Systems  All other systems reviewed and are negative.     Allergies  Atenolol and Lipitor  Home Medications   Prior to Admission medications   Medication Sig Start Date End Date Taking? Authorizing Provider  acetaminophen (TYLENOL) 325 MG tablet Take 2 tablets (650 mg total) by mouth every 4 (four) hours as needed for mild pain (or temp >/= 99.5 F). 01/29/15  Yes Delfina Redwood, MD  ALPRAZolam Duanne Moron) 0.5 MG tablet Take 1 tablet (0.5 mg total) by mouth 2 (two) times daily as needed for anxiety. 01/29/15  Yes Delfina Redwood, MD  Ascorbic Acid (VITAMIN C) 1000 MG tablet Take 1,000 mg by mouth daily.   Yes Historical Provider, MD  aspirin EC 325 MG EC tablet Take 1 tablet (325 mg total) by mouth daily. 09/26/14  Yes Shanker Kristeen Mans, MD  Cholecalciferol (VITAMIN D) 2000 UNITS CAPS Take 4,000 Units by mouth daily.    Yes Historical Provider, MD  glimepiride (AMARYL) 4 MG tablet Take 0.5 tablets (2 mg total) by mouth 2 (two) times daily. 01/29/15  Yes Delfina Redwood, MD  GLUCERNA (GLUCERNA) LIQD Take 237 mLs by mouth 2 (two) times daily between meals.    Yes Historical Provider, MD  Lactulose 20 GM/30ML SOLN Take 15 mLs (10 g total) by  mouth 2 (two) times daily. 01/16/15  Yes Owens Shark, NP  lisinopril (ZESTRIL) 2.5 MG tablet Take 1 tablet (2.5 mg total) by mouth daily. 01/29/15  Yes Delfina Redwood, MD  metFORMIN (GLUCOPHAGE-XR) 500 MG 24 hr tablet TAKE ONE TABLET BY MOUTH TWICE DAILY WITH BREAKFAST AND LUNCH Patient taking differently: Take 500 mg by mouth 2 (two) times daily.  01/01/15  Yes Unk Pinto, MD  Multiple Vitamin (MULTIVITAMIN WITH MINERALS) TABS Take 1 tablet by mouth daily.   Yes Historical Provider, MD  ondansetron (ZOFRAN) 4 MG tablet Take 1 tablet (4 mg total) by mouth every 8 (eight) hours as needed for nausea or vomiting. Take prior to Temodar 01/29/15  Yes Delfina Redwood, MD  polyethylene glycol (MIRALAX / GLYCOLAX) packet Take 17 g by mouth daily.   Yes  Historical Provider, MD  senna (SENOKOT) 8.6 MG TABS tablet Take 2 tablets (17.2 mg total) by mouth 2 (two) times daily. For constipation. Adjust as  needed Patient taking differently: Take 1 tablet by mouth 2 (two) times daily. For constipation. Adjust as  needed 04/27/14  Yes Ivan Anchors Love, PA-C  HYDROcodone-acetaminophen (NORCO/VICODIN) 5-325 MG per tablet Take 1 tablet by mouth every 4 (four) hours as needed for moderate pain. 02/01/15   Jola Schmidt, MD   BP 119/67 mmHg  Pulse 60  Temp(Src) 98.1 F (36.7 C) (Oral)  Resp 16  SpO2 99% Physical Exam  Constitutional: He is oriented to person, place, and time. He appears well-developed and well-nourished.  HENT:  Head: Normocephalic and atraumatic.  Eyes: EOM are normal. Pupils are equal, round, and reactive to light.  Neck: Normal range of motion.  Cardiovascular: Normal rate, regular rhythm, normal heart sounds and intact distal pulses.   Pulmonary/Chest: Effort normal and breath sounds normal. No respiratory distress.  Abdominal: Soft. He exhibits no distension. There is no tenderness.  Musculoskeletal: Normal range of motion.  Neurological: He is alert and oriented to person, place, and time.  5/5 strength in major muscle groups of  bilateral upper and lower extremities. Speech normal. No facial asymetry.   Skin: Skin is warm and dry.  Psychiatric: He has a normal mood and affect. Judgment normal.  Nursing note and vitals reviewed.   ED Course  Procedures (including critical care time) Labs Review Labs Reviewed  CBC WITH DIFFERENTIAL/PLATELET - Abnormal; Notable for the following:    Neutrophils Relative % 78 (*)    All other components within normal limits  BASIC METABOLIC PANEL - Abnormal; Notable for the following:    Glucose, Bld 109 (*)    All other components within normal limits    Imaging Review Ct Head Wo Contrast  02/01/2015   CLINICAL DATA:  69 year old male with acute headache for 1 day. History of left  frontal meningioma resection.  EXAM: CT HEAD WITHOUT CONTRAST  TECHNIQUE: Contiguous axial images were obtained from the base of the skull through the vertex without intravenous contrast.  COMPARISON:  01/26/2015 and prior studies  FINDINGS: Chronic small-vessel white matter ischemic changes and cerebral volume loss again noted. Mild high left frontal encephalomalacia again noted.  No acute intracranial abnormalities are identified, including mass lesion or mass effect, hydrocephalus, extra-axial fluid collection, midline shift, hemorrhage, or acute infarction.  The visualized bony calvarium is unremarkable except for craniotomy changes at the vertex again noted.  IMPRESSION: No evidence of acute intracranial abnormality.  Atrophy, chronic small-vessel white matter ischemic changes and mild high left frontal encephalomalacia.   Electronically  Signed   By: Margarette Canada M.D.   On: 02/01/2015 12:11   I have personally reviewed and evaluated these images and lab results as part of my medical decision-making.   EKG Interpretation None      MDM   Final diagnoses:  Headache, unspecified headache type    Patient feels much better after IV medication emergency department.  CT scan of the head is without acute abnormality.  Discharge home in good condition.  Primary care follow-up.    Jola Schmidt, MD 02/01/15 [phone]

## 2015-02-07 ENCOUNTER — Telehealth: Payer: Self-pay | Admitting: *Deleted

## 2015-02-07 NOTE — Telephone Encounter (Signed)
Message from [place] to follow up on Temodar Rx. Spoke with Becky at [facility], pt will be seen in office on 9/2. MD will reorder then as appropriate.

## 2015-02-08 ENCOUNTER — Ambulatory Visit (HOSPITAL_BASED_OUTPATIENT_CLINIC_OR_DEPARTMENT_OTHER): Payer: Medicare Other

## 2015-02-08 ENCOUNTER — Ambulatory Visit (HOSPITAL_BASED_OUTPATIENT_CLINIC_OR_DEPARTMENT_OTHER): Payer: Medicare Other | Admitting: Nurse Practitioner

## 2015-02-08 ENCOUNTER — Telehealth: Payer: Self-pay | Admitting: Nurse Practitioner

## 2015-02-08 ENCOUNTER — Telehealth: Payer: Self-pay | Admitting: Oncology

## 2015-02-08 VITALS — BP 134/76 | HR 90 | Temp 98.7°F | Resp 18 | Ht 76.0 in | Wt 171.0 lb

## 2015-02-08 DIAGNOSIS — C7951 Secondary malignant neoplasm of bone: Secondary | ICD-10-CM

## 2015-02-08 DIAGNOSIS — C61 Malignant neoplasm of prostate: Secondary | ICD-10-CM

## 2015-02-08 DIAGNOSIS — C7931 Secondary malignant neoplasm of brain: Secondary | ICD-10-CM | POA: Diagnosis not present

## 2015-02-08 DIAGNOSIS — R531 Weakness: Secondary | ICD-10-CM | POA: Diagnosis not present

## 2015-02-08 LAB — COMPREHENSIVE METABOLIC PANEL (CC13)
ALBUMIN: 3.2 g/dL — AB (ref 3.5–5.0)
ALK PHOS: 100 U/L (ref 40–150)
ALT: 17 U/L (ref 0–55)
AST: 14 U/L (ref 5–34)
Anion Gap: 9 mEq/L (ref 3–11)
BUN: 13.1 mg/dL (ref 7.0–26.0)
CALCIUM: 9.9 mg/dL (ref 8.4–10.4)
CO2: 26 mEq/L (ref 22–29)
Chloride: 107 mEq/L (ref 98–109)
Creatinine: 0.9 mg/dL (ref 0.7–1.3)
EGFR: 88 mL/min/{1.73_m2} — AB (ref >90)
GLUCOSE: 108 mg/dL (ref 70–140)
POTASSIUM: 3.9 meq/L (ref 3.5–5.1)
SODIUM: 143 meq/L (ref 136–145)
Total Bilirubin: 0.4 mg/dL (ref 0.20–1.20)
Total Protein: 6.7 g/dL (ref 6.4–8.3)

## 2015-02-08 LAB — CBC WITH DIFFERENTIAL/PLATELET
BASO%: 0.8 % (ref 0.0–2.0)
BASOS ABS: 0.1 10*3/uL (ref 0.0–0.1)
EOS ABS: 0.1 10*3/uL (ref 0.0–0.5)
EOS%: 1 % (ref 0.0–7.0)
HCT: 47.9 % (ref 38.4–49.9)
HEMOGLOBIN: 15.5 g/dL (ref 13.0–17.1)
LYMPH%: 14.7 % (ref 14.0–49.0)
MCH: 29.7 pg (ref 27.2–33.4)
MCHC: 32.4 g/dL (ref 32.0–36.0)
MCV: 91.7 fL (ref 79.3–98.0)
MONO#: 0.6 10*3/uL (ref 0.1–0.9)
MONO%: 8 % (ref 0.0–14.0)
NEUT#: 6.1 10*3/uL (ref 1.5–6.5)
NEUT%: 75.5 % — AB (ref 39.0–75.0)
Platelets: 212 10*3/uL (ref 140–400)
RBC: 5.22 10*6/uL (ref 4.20–5.82)
RDW: 15.8 % — AB (ref 11.0–14.6)
WBC: 8 10*3/uL (ref 4.0–10.3)
lymph#: 1.2 10*3/uL (ref 0.9–3.3)

## 2015-02-08 NOTE — Telephone Encounter (Signed)
Gave adn printed appt sched and avs for pt for SEpt  °

## 2015-02-08 NOTE — Telephone Encounter (Signed)
Call placed to Mrs. Handel to Confirm what Center Mr. Owensby was in she stated he was in the [facility] [phone]

## 2015-02-08 NOTE — Telephone Encounter (Signed)
Called Placed to [facility] to inquire if Nurse taking care of Mr. Overholser would give him Oral Chemo in that facility. Spoke with Marzetta Board Mr. Ileana Roup Nurse and Kieth Brightly from the business office to see if this was possible. Kieth Brightly the Environmental health practitioner stated they were not able to administer the Med it is out of their scope of practice.

## 2015-02-08 NOTE — Progress Notes (Addendum)
[place] OFFICE PROGRESS NOTE   Diagnosis:  Metastatic small cell carcinoma  INTERVAL HISTORY:   Mr. Schlafer returns as scheduled. He completed cycle 1 temozolomide beginning 01/11/2015. He denies nausea/vomiting. No mouth sores. No rash. A few days after he completed the course of temozolomide he began having headaches. His wife thinks the headaches were related to the temozolomide. He was hospitalized for evaluation of the headaches. 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 He was recently discharged to a nursing facility where he continues to reside.  He denies any recent headaches. He feels he is improving with regard to strength. He continues working with physical therapy. He has a good appetite. No nausea or vomiting. No mouth sores. No diarrhea. No skin rash.  His wife reports she has been giving him a dose of Xanax in the evenings before leaving the facility. She does not think the nursing staff is aware of this.  Objective:  Vital signs in last 24 hours:  Blood pressure 134/76, pulse 90, temperature 98.7 F (37.1 C), temperature source Oral, resp. rate 18, height 6\' 4"  (1.93 m), weight 171 lb (77.565 kg), SpO2 100 %.    HEENT: No thrush or ulcers. Lymphatics: No palpable cervical, supra clavicular or axillary lymph nodes. Resp: Lungs clear bilaterally. Cardio: Regular rate and rhythm. GI: Abdomen soft and nontender. No hepatomegaly. Vascular: No leg edema. Neuro: Alert and oriented. Follows commands. Decreased strength left side.     Lab Results:  Lab Results  Component Value Date   WBC 6.9 02/01/2015   HGB 13.9 02/01/2015   HCT 42.7 02/01/2015   MCV 90.9 02/01/2015   PLT 232 02/01/2015   NEUTROABS 5.4 02/01/2015    Imaging:  No results found.  Medications: I have reviewed the patient's current medications.  Assessment/Plan: 1. 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, status post resection  04/06/2014  Staging CTs of the chest, abdomen, and pelvis 04/12/2014 confirmed a prostate mass  Prostate ultrasound 04/16/2014 confirmed a prostate mass with a biopsy consistent with small cell carcinoma  Extrinsic appearing cecal mass confirmed on colonoscopy 04/19/2014 with a biopsy revealing small cell carcinoma  Whole brain and prostate radiation 04/24/2014 through 05/16/2014  Cycle 1 carboplatin/etoposide beginning 05/22/2014 with Neulasta support  Cycle 2 carboplatin/etoposide 06/19/2014  Cycle 3 carboplatin/etoposide 07/17/2014  Restaging CT scans 08/10/2014 with near-complete resolution of the large mass involving the prostate gland; resolution of inferior perirectal and right ileocolonic mesenteric lymph nodes; progressive right internal iliac/presacral adenopathy and new small bilateral adrenal nodules; persistent soft tissue nodule involving the medial wall of the cecum.  Cycle 4 carboplatin/etoposide 08/14/2014  MRI brain 09/18/2014 with interval collapse of the bilateral posterior frontal resection cavities. Minimal enhancement at the right frontal resection site has decreased. Increased enhancement along the margins of the left frontal resection cavity favored to be postoperative. Small amount of new non-mass-like enhancement more posteriorly in the paramedian right frontoparietal region likely postoperative/post ischemic. Decreased size of 7 mm falcine mass. No evidence of new intracranial metastases.  cycle 5 carboplatin/etoposide 09/24/2014  CTs 10/10/2014 revealed enlargement of bilateral adrenal nodules, no other evidence of disease progression  CT 12/27/2014 with enlargement of adrenal metastases, a new peritoneal implant, and a recurrent iliac node   Cycle 1 temozolomide 01/11/2015  Cycle 2 temozolomide 02/13/2015 2. Left frontal meningioma, WHO grade 1, status post resection 04/06/2014 3. Left leg weakness secondary to #1 4. 1 cmbrain lesion at the inferior  falx  noted on the MRI 03/21/2014, not resected 5. Diabetes  6. Hypertension  7. Hyperlipidemia 8. Neutropenia following cycle 1 carboplatin/etoposide. 9. Hospitalization with headache and weakness 01/26/2015 through 01/29/2015. 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    Disposition: Mr. Hilligoss overall appears stable. He has completed 1 cycle of monthly temozolomide. He was subsequently hospitalized with headache and weakness. We feel it is unlikely the headaches were related to temozolomide. The plan is to proceed with cycle 2 temozolomide beginning 02/13/2015. His wife understands to contact the office if he has recurrent headaches with cycle 2. The plan is to obtain restaging CT scans after he has completed 3 cycles.   He will return for a follow-up visit in 3-4 weeks. He will contact the office in the interim as outlined above or with any other problems.  At today's visit Ms. Sine informed me that she has been giving him Xanax in the evenings before leaving the facility. I instructed her to let the facility know she has been doing this and recommended that she not administer any medications without their knowledge.  Patient seen with Dr. Benay Spice. 25 minutes were spent face-to-face at today's visit with the majority of that time involved in counseling/coordination of care.  Ned Card ANP/GNP-BC   02/08/2015  11:32 AM  This was a shared visit with Ned Card. Mr. Neto was interviewed and examined. His performance status appears improved compared to when he was in the hospital last month. He appears to have tolerated the first cycle of temozolomide well. He will begin another cycle of temozolomide on 02/13/2015.  Mr. Ryser will return for an office and lab visit 03/08/2015.  [name], M.D.

## 2015-02-18 ENCOUNTER — Telehealth: Payer: Self-pay | Admitting: *Deleted

## 2015-02-18 NOTE — Telephone Encounter (Signed)
Spouse Santiago Glad called reporting [facility] notified her today they can administer Temodar and can order today.  They will order from their Pharm[facility].  Lyndel Pleasure says he is there because he can't walk and will have them order Temodar and touch base with [place] when medicine received.  Would like to know when to start Temodar.  This is pending receipt of Temodar.  Next scheduled F/U is 03-08-2015 with labs.  Per recent office note the plan was to begin this cycle on 02-13-2015.  This nurse called [facility] [phone]) at 1330 to determine if medicine can be administered if received from [facility].  Erline Levine RN is not aware of this but will notify the supervisor who Mrs. Sudano has spoken with about the Temodar.  [place] is the used.  Erline Levine doesn't know if they may obtain Temodar from [facility].  Expressed concern for receiving drug and at what cost.  Erline Levine will notify supervisor.

## 2015-02-21 ENCOUNTER — Other Ambulatory Visit: Payer: Self-pay | Admitting: *Deleted

## 2015-02-21 MED ORDER — TEMOZOLOMIDE 140 MG PO CAPS: 140.0000 mg | ORAL_CAPSULE | Freq: Every day | ORAL | Status: DC

## 2015-02-21 NOTE — Telephone Encounter (Signed)
Called to follow-up with Erline Levine, RN at [facility]; Erline Levine reports that pt did receive his Temodar 02/20/15.  States "he took his first dose last night 02/20/15"

## 2015-02-22 MED ORDER — TEMOZOLOMIDE 140 MG PO CAPS: 280.0000 mg | ORAL_CAPSULE | Freq: Every day | ORAL | Status: DC

## 2015-02-22 NOTE — Addendum Note (Signed)
Addended by: Domenic Schwab on: 02/22/2015 03:52 PM   Modules accepted: Orders

## 2015-02-26 NOTE — Telephone Encounter (Signed)
Ok to move next appt. And lab out 1 week

## 2015-02-28 ENCOUNTER — Telehealth: Payer: Self-pay | Admitting: Oncology

## 2015-02-28 NOTE — Telephone Encounter (Signed)
Spoke with patient wife and confirmed appointment moved from 9/30 to 10/07

## 2015-02-28 NOTE — Telephone Encounter (Signed)
P.O.F. Generated 

## 2015-03-01 ENCOUNTER — Encounter (HOSPITAL_COMMUNITY): Payer: Self-pay | Admitting: Emergency Medicine

## 2015-03-01 ENCOUNTER — Emergency Department (HOSPITAL_COMMUNITY)
Admission: EM | Admit: 2015-03-01 | Discharge: 2015-03-02 | Disposition: A | Discharge status: Home / Self Care | Payer: Medicare Other | Attending: Emergency Medicine | Admitting: Emergency Medicine

## 2015-03-01 DIAGNOSIS — R4182 Altered mental status, unspecified: Secondary | ICD-10-CM | POA: Diagnosis not present

## 2015-03-01 DIAGNOSIS — Z79899 Other long term (current) drug therapy: Secondary | ICD-10-CM | POA: Insufficient documentation

## 2015-03-01 DIAGNOSIS — E559 Vitamin D deficiency, unspecified: Secondary | ICD-10-CM | POA: Diagnosis not present

## 2015-03-01 DIAGNOSIS — E119 Type 2 diabetes mellitus without complications: Secondary | ICD-10-CM | POA: Diagnosis not present

## 2015-03-01 DIAGNOSIS — R443 Hallucinations, unspecified: Secondary | ICD-10-CM | POA: Diagnosis present

## 2015-03-01 DIAGNOSIS — Z86010 Personal history of colonic polyps: Secondary | ICD-10-CM | POA: Insufficient documentation

## 2015-03-01 DIAGNOSIS — Z8546 Personal history of malignant neoplasm of prostate: Secondary | ICD-10-CM | POA: Insufficient documentation

## 2015-03-01 DIAGNOSIS — Z8719 Personal history of other diseases of the digestive system: Secondary | ICD-10-CM | POA: Insufficient documentation

## 2015-03-01 DIAGNOSIS — M79662 Pain in left lower leg: Secondary | ICD-10-CM | POA: Diagnosis not present

## 2015-03-01 DIAGNOSIS — E785 Hyperlipidemia, unspecified: Secondary | ICD-10-CM | POA: Insufficient documentation

## 2015-03-01 DIAGNOSIS — Z7982 Long term (current) use of aspirin: Secondary | ICD-10-CM | POA: Diagnosis not present

## 2015-03-01 DIAGNOSIS — R531 Weakness: Secondary | ICD-10-CM | POA: Diagnosis not present

## 2015-03-01 DIAGNOSIS — I10 Essential (primary) hypertension: Secondary | ICD-10-CM | POA: Insufficient documentation

## 2015-03-01 NOTE — ED Notes (Signed)
Bed: WA03 Expected date:  Expected time:  Means of arrival:  Comments: EMS Ca pt/new chemo/?hallucinations

## 2015-03-01 NOTE — ED Provider Notes (Signed)
CSN: [id number]     Arrival date & time 03/01/15  2314 History  This chart was scribed for Virgel Manifold, MD by Rayna Sexton, ED scribe. This patient was seen in room WA03/WA03 and the patient's care was started at 12:01 AM.   Chief Complaint  Patient presents with  . Hallucinations  . Medication Reaction   The history is provided by the patient and the spouse. No language interpreter was used.    HPI Comments: Ivan Stark is a 69 y.o. male, with a hx of CA, who presents to the Emergency Department by [facility] from [facility] due to a possible medication reaction with onset 2 days ago. His wife notes that 8/23 he began experiencing a moderate HA after finishing his 1st round of temodar and was then sent to [facility]. Beginning 2 days ago after finishing his 2nd round of temodar 5 days ago, his wife further notes that he began experiencing hallucinations which worsened and became constant and describes his mood as "stark/terror". His wife notes that he believed he was being kidnapped and that the staff was attempting to hurt him. Pt's wife notes that his CA began in his brain and has recently spread to his adrenal glands.   Pt also notes associated weakness and experiencing a near fall episode earlier this week in which he was caught by his two sons. He denies any urinary issues.   Past Medical History  Diagnosis Date  . Hypertension   . Hyperlipidemia   . GERD (gastroesophageal reflux disease)   . Vitamin D deficiency   . History of colon polyps   . Complication of anesthesia     Difficult to wake up; and severe nausea and vomiting  . PONV (postoperative nausea and vomiting)   . Diabetes mellitus without complication     metformin  . Cancer 03/2014    prostate ca   Past Surgical History  Procedure Laterality Date  . Tonsillectomy    . Colonoscopy w/ polypectomy    . Esophagus surgery      Had esophagus stretched due to food getting trapped in his  throat  . Craniotomy Bilateral 04/06/2014    Procedure: Bilateral Fronto-parietal craniotomy for resection of tumors with Curve;  Surgeon: Consuella Lose, MD;  Location: [place] [facility];  Service: Neurosurgery;  Laterality: Bilateral;  Bilateral Fronto-parietal craniotomy for resection of tumors with Curve  . Colonoscopy N/A 04/19/2014    Procedure: COLONOSCOPY;  Surgeon: Winfield Cunas., MD;  Location: [facility];  Service: Endoscopy;  Laterality: N/A;   Family History  Problem Relation Age of Onset  . Goiter Mother   . Hypertension Father   . Cancer Father     colon   Social History  Substance Use Topics  . Smoking status: Never Smoker   . Smokeless tobacco: None  . Alcohol Use: No    Review of Systems  Neurological: Positive for weakness.  Psychiatric/Behavioral: Positive for hallucinations, confusion and dysphoric mood.  All other systems reviewed and are negative.  Allergies  Atenolol and Lipitor  Home Medications   Prior to Admission medications   Medication Sig Start Date End Date Taking? Authorizing Provider  acetaminophen (TYLENOL) 325 MG tablet Take 2 tablets (650 mg total) by mouth every 4 (four) hours as needed for mild pain (or temp >/= 99.5 F). 01/29/15   Delfina Redwood, MD  ALPRAZolam Duanne Moron) 0.5 MG tablet Take 1 tablet (0.5 mg total) by mouth 2 (two) times daily  as needed for anxiety. 01/29/15   Delfina Redwood, MD  Ascorbic Acid (VITAMIN C) 1000 MG tablet Take 1,000 mg by mouth daily.    Historical Provider, MD  aspirin EC 325 MG EC tablet Take 1 tablet (325 mg total) by mouth daily. 09/26/14   Shanker Kristeen Mans, MD  Cholecalciferol (VITAMIN D) 2000 UNITS CAPS Take 4,000 Units by mouth daily.     Historical Provider, MD  glimepiride (AMARYL) 4 MG tablet Take 0.5 tablets (2 mg total) by mouth 2 (two) times daily. 01/29/15   Delfina Redwood, MD  GLUCERNA (GLUCERNA) LIQD Take 237 mLs by mouth 2 (two) times daily between meals.     Historical Provider,  MD  HYDROcodone-acetaminophen (NORCO/VICODIN) 5-325 MG per tablet Take 1 tablet by mouth every 4 (four) hours as needed for moderate pain. 02/01/15   Jola Schmidt, MD  Lactulose 20 GM/30ML SOLN Take 15 mLs (10 g total) by mouth 2 (two) times daily. 01/16/15   Owens Shark, NP  lisinopril (ZESTRIL) 2.5 MG tablet Take 1 tablet (2.5 mg total) by mouth daily. 01/29/15   Delfina Redwood, MD  metFORMIN (GLUCOPHAGE-XR) 500 MG 24 hr tablet TAKE ONE TABLET BY MOUTH TWICE DAILY WITH BREAKFAST AND LUNCH Patient taking differently: Take 500 mg by mouth 2 (two) times daily.  01/01/15   Unk Pinto, MD  Multiple Vitamin (MULTIVITAMIN WITH MINERALS) TABS Take 1 tablet by mouth daily.    Historical Provider, MD  ondansetron (ZOFRAN) 4 MG tablet Take 1 tablet (4 mg total) by mouth every 8 (eight) hours as needed for nausea or vomiting. Take prior to Temodar 01/29/15   Delfina Redwood, MD  polyethylene glycol (MIRALAX / GLYCOLAX) packet Take 17 g by mouth daily.    Historical Provider, MD  senna (SENOKOT) 8.6 MG TABS tablet Take 2 tablets (17.2 mg total) by mouth 2 (two) times daily. For constipation. Adjust as  needed Patient taking differently: Take 1 tablet by mouth 2 (two) times daily. For constipation. Adjust as  needed 04/27/14   Bary Leriche, PA-C  temozolomide (TEMODAR) 140 MG capsule Take 2 capsules (280 mg total) by mouth daily. May take on an empty stomach or at bedtime to decrease nausea & vomiting. 02/22/15   Ladell Pier, MD   BP 108/81 mmHg  Pulse 91  Temp(Src) 98.7 F (37.1 C) (Oral)  Resp 17  SpO2 97% Physical Exam  Constitutional: He is oriented to person, place, and time. He appears well-developed and well-nourished.  HENT:  Head: Normocephalic and atraumatic.  Mouth/Throat: No oropharyngeal exudate.  Eyes: EOM are normal. Pupils are equal, round, and reactive to light.  Neck: Normal range of motion. No tracheal deviation present.  Cardiovascular: Normal rate.   Pulmonary/Chest:  Effort normal. No respiratory distress.  Abdominal: Soft. There is no tenderness.  Musculoskeletal: Normal range of motion.  Neurological: He is alert and oriented to person, place, and time. No cranial nerve deficit.  Delay in answering questions but answers appropriately; follows commands; strength 5/5 UE's; 5/5 RLE. Mild decreased strength LLE. Pt says baseline and wife confirms.   Skin: Skin is warm and dry. He is not diaphoretic.  Psychiatric: He has a normal mood and affect. His behavior is normal.  Nursing note and vitals reviewed.  ED Course  Procedures  DIAGNOSTIC STUDIES: Oxygen Saturation is 97% on RA, normal by my interpretation.    COORDINATION OF CARE: 12:15 AM Discussed treatment plan with pt at bedside and pt agreed to  plan.  Labs Review Labs Reviewed  BASIC METABOLIC PANEL - Abnormal; Notable for the following:    Glucose, Bld 190 (*)    All other components within normal limits  CBC WITH DIFFERENTIAL/PLATELET  URINALYSIS, ROUTINE W REFLEX MICROSCOPIC (NOT AT [facility])    Imaging Review No results found. I have personally reviewed and evaluated these images and lab results as part of my medical decision-making.   EKG Interpretation None      MDM   Final diagnoses:  Altered mental status, unspecified altered mental status type   I  68yM with weakness and mental status changes. Suspect worsening weakness is 2/2 underlying disease and deconditioning as opposed to an acute event. Waxing/waning mental status changes. Seems most consistent with delirium. Currently he is awake and alert. Answers questions somewhat slowly with a pause before responses but answering everything appropriately and following commands. Thought process is logical. Remembers behavior earlier today and understands it wasn't rational but cannot give explanation for it. Wife seems to think symptoms may be related to chemo. Mental status changes listed as possible side effect of temozolomide but I  am not familiar with this medication to know how often this is actually seen clinically. He is afebrile and HD stable. Tired appearing but nontoxic. Basic labs/UA unremarkable. Wife with concerns about continued care. Apparently his 30 days at facility ends this morning (9/24) and pt was going come home. Expresses significant concerns about her ability to take care of him. It was becoming increasingly difficult to take care of him at home even prior to hospitalization last month. Wife reports someone at current facility mentioning home health services but nothing has actually been arranged. At this time I do not have a clear indication for hospitalization. Will keep in ED for a few more hours until social work can speak with them in am with regards to home health needs and/or other placement options.   I personally preformed the services scribed in my presence. The recorded information has been reviewed is accurate. Virgel Manifold, MD.    Virgel Manifold, MD 03/09/15 1440

## 2015-03-01 NOTE — ED Notes (Signed)
[facility] presents with a 68 yo male from [facility] with a possible medication reaction with hallucinations.  Staff reported to [facility] that patient was on cancer medication that doctor placed pt on and had a (minor) stroke from this medication.  Pt was restarted on the same med by docotor for cancer treatment and pt began having hallucinations and thinking that someone was trying to kidnap him.  Hx of brain tumor removal.  CBG=231 mg/dl

## 2015-03-02 ENCOUNTER — Encounter (HOSPITAL_COMMUNITY): Payer: Self-pay

## 2015-03-02 ENCOUNTER — Emergency Department (HOSPITAL_COMMUNITY)
Admission: EM | Admit: 2015-03-02 | Discharge: 2015-03-03 | Disposition: A | Discharge status: Home / Self Care | Payer: Medicare Other | Attending: Emergency Medicine | Admitting: Emergency Medicine

## 2015-03-02 ENCOUNTER — Emergency Department (HOSPITAL_COMMUNITY): Payer: Medicare Other

## 2015-03-02 DIAGNOSIS — Z8546 Personal history of malignant neoplasm of prostate: Secondary | ICD-10-CM | POA: Insufficient documentation

## 2015-03-02 DIAGNOSIS — Z8719 Personal history of other diseases of the digestive system: Secondary | ICD-10-CM | POA: Diagnosis not present

## 2015-03-02 DIAGNOSIS — Z79899 Other long term (current) drug therapy: Secondary | ICD-10-CM | POA: Insufficient documentation

## 2015-03-02 DIAGNOSIS — Z7982 Long term (current) use of aspirin: Secondary | ICD-10-CM | POA: Insufficient documentation

## 2015-03-02 DIAGNOSIS — M79662 Pain in left lower leg: Secondary | ICD-10-CM | POA: Insufficient documentation

## 2015-03-02 DIAGNOSIS — E119 Type 2 diabetes mellitus without complications: Secondary | ICD-10-CM | POA: Insufficient documentation

## 2015-03-02 DIAGNOSIS — E559 Vitamin D deficiency, unspecified: Secondary | ICD-10-CM | POA: Insufficient documentation

## 2015-03-02 DIAGNOSIS — I10 Essential (primary) hypertension: Secondary | ICD-10-CM | POA: Insufficient documentation

## 2015-03-02 DIAGNOSIS — Z8673 Personal history of transient ischemic attack (TIA), and cerebral infarction without residual deficits: Secondary | ICD-10-CM | POA: Diagnosis not present

## 2015-03-02 DIAGNOSIS — Z87828 Personal history of other (healed) physical injury and trauma: Secondary | ICD-10-CM | POA: Diagnosis not present

## 2015-03-02 DIAGNOSIS — Z86010 Personal history of colonic polyps: Secondary | ICD-10-CM | POA: Insufficient documentation

## 2015-03-02 DIAGNOSIS — M79605 Pain in left leg: Secondary | ICD-10-CM

## 2015-03-02 HISTORY — DX: Cerebral infarction, unspecified: I63.9

## 2015-03-02 LAB — CBC WITH DIFFERENTIAL/PLATELET
Basophils Absolute: 0 10*3/uL (ref 0.0–0.1)
Basophils Relative: 1 %
Eosinophils Absolute: 0.1 10*3/uL (ref 0.0–0.7)
Eosinophils Relative: 1 %
HCT: 44.9 % (ref 39.0–52.0)
Hemoglobin: 14.9 g/dL (ref 13.0–17.0)
Lymphocytes Relative: 24 %
Lymphs Abs: 1.4 10*3/uL (ref 0.7–4.0)
MCH: 29.9 pg (ref 26.0–34.0)
MCHC: 33.2 g/dL (ref 30.0–36.0)
MCV: 90.2 fL (ref 78.0–100.0)
Monocytes Absolute: 0.7 10*3/uL (ref 0.1–1.0)
Monocytes Relative: 11 %
Neutro Abs: 3.8 10*3/uL (ref 1.7–7.7)
Neutrophils Relative %: 63 %
Platelets: 229 10*3/uL (ref 150–400)
RBC: 4.98 MIL/uL (ref 4.22–5.81)
RDW: 14.9 % (ref 11.5–15.5)
WBC: 6 10*3/uL (ref 4.0–10.5)

## 2015-03-02 LAB — BASIC METABOLIC PANEL
Anion gap: 6 (ref 5–15)
BUN: 18 mg/dL (ref 6–20)
CO2: 26 mmol/L (ref 22–32)
Calcium: 9.3 mg/dL (ref 8.9–10.3)
Chloride: 108 mmol/L (ref 101–111)
Creatinine, Ser: 0.86 mg/dL (ref 0.61–1.24)
GFR calc Af Amer: >60 mL/min (ref >60)
GFR calc non Af Amer: >60 mL/min (ref >60)
Glucose, Bld: 190 mg/dL — ABNORMAL HIGH (ref 65–99)
Potassium: 4.2 mmol/L (ref 3.5–5.1)
Sodium: 140 mmol/L (ref 135–145)

## 2015-03-02 LAB — URINALYSIS, ROUTINE W REFLEX MICROSCOPIC
Bilirubin Urine: NEGATIVE
Glucose, UA: NEGATIVE mg/dL
Hgb urine dipstick: NEGATIVE
Ketones, ur: NEGATIVE mg/dL
Leukocytes, UA: NEGATIVE
Nitrite: NEGATIVE
Protein, ur: NEGATIVE mg/dL
Specific Gravity, Urine: 1.022 (ref 1.005–1.030)
Urobilinogen, UA: 0.2 mg/dL (ref 0.0–1.0)
pH: 5 (ref 5.0–8.0)

## 2015-03-02 NOTE — Progress Notes (Signed)
Spoke with Wife who is agreeable to [employer]. They have had [facility]  in the past and would like to continue these services. Made the referral and spoke with Clint Lipps [phone] who is concerned that the family may need more than HH. This CM Hopeful that an in home visit will provide the wife with the support and resources she needs to provide the appropriate level of care. Will sign off for now.

## 2015-03-02 NOTE — Progress Notes (Signed)
CM received call re: pt has been at [facility] for 40 days and his "time has lapsed" wife is concerned that she can no longer care for him in the home. CM spoke with Billy Fischer, MD and made her aware that I would be covering as CM and gave her my phone number. Will attempt to call CSW and assess CM needs. Will continue to follow.

## 2015-03-02 NOTE — ED Provider Notes (Signed)
CSN: [id number]     Arrival date & time 03/02/15  2037 History   First MD Initiated Contact with Patient 03/02/15 2227     Chief Complaint  Patient presents with  . Fall     (Consider location/radiation/quality/duration/timing/severity/associated sxs/prior Treatment) HPI   Ivan Stark is a 69 y.o. male who presents for evaluation of pain. Family members are concerned that the leg was injured in a fall several days ago. The patient is unable to give any history. He was diagnosed with acute delirium, during her emergency department visit last night. Left emergency department about noon today, after talking to social services, and case management. Home health services have been arranged.  Level V caveat- altered mental status   Past Medical History  Diagnosis Date  . Hypertension   . Hyperlipidemia   . GERD (gastroesophageal reflux disease)   . Vitamin D deficiency   . History of colon polyps   . Complication of anesthesia     Difficult to wake up; and severe nausea and vomiting  . PONV (postoperative nausea and vomiting)   . Diabetes mellitus without complication     metformin  . Cancer 03/2014    prostate ca  . Stroke    Past Surgical History  Procedure Laterality Date  . Tonsillectomy    . Colonoscopy w/ polypectomy    . Esophagus surgery      Had esophagus stretched due to food getting trapped in his throat  . Craniotomy Bilateral 04/06/2014    Procedure: Bilateral Fronto-parietal craniotomy for resection of tumors with Curve;  Surgeon: Consuella Lose, MD;  Location: [place] [facility];  Service: Neurosurgery;  Laterality: Bilateral;  Bilateral Fronto-parietal craniotomy for resection of tumors with Curve  . Colonoscopy N/A 04/19/2014    Procedure: COLONOSCOPY;  Surgeon: Winfield Cunas., MD;  Location: [facility];  Service: Endoscopy;  Laterality: N/A;   Family History  Problem Relation Age of Onset  . Goiter Mother   . Hypertension Father   . Cancer Father      colon   Social History  Substance Use Topics  . Smoking status: Never Smoker   . Smokeless tobacco: None  . Alcohol Use: No    Review of Systems  Unable to perform ROS     Allergies  Atenolol and Lipitor  Home Medications   Prior to Admission medications   Medication Sig Start Date End Date Taking? Authorizing Meisha Salone  acetaminophen (TYLENOL) 325 MG tablet Take 2 tablets (650 mg total) by mouth every 4 (four) hours as needed for mild pain (or temp >/= 99.5 F). 01/29/15  Yes Delfina Redwood, MD  ALPRAZolam Duanne Moron) 0.5 MG tablet Take 1 tablet (0.5 mg total) by mouth 2 (two) times daily as needed for anxiety. 01/29/15  Yes Delfina Redwood, MD  Ascorbic Acid (VITAMIN C) 1000 MG tablet Take 1,000 mg by mouth daily.   Yes Historical Janeliz Prestwood, MD  aspirin EC 81 MG tablet Take 81 mg by mouth daily.   Yes Historical Washington Whedbee, MD  Cholecalciferol (VITAMIN D) 2000 UNITS CAPS Take 4,000 Units by mouth daily.    Yes Historical Ladonne Sharples, MD  glimepiride (AMARYL) 4 MG tablet Take 0.5 tablets (2 mg total) by mouth 2 (two) times daily. 01/29/15  Yes Delfina Redwood, MD  GLUCERNA (GLUCERNA) LIQD Take 237 mLs by mouth 2 (two) times daily between meals.    Yes Historical Krithik Mapel, MD  HYDROcodone-acetaminophen (NORCO/VICODIN) 5-325 MG per tablet Take 1 tablet by mouth every  4 (four) hours as needed for moderate pain. 02/01/15  Yes Jola Schmidt, MD  Lactulose 20 GM/30ML SOLN Take 15 mLs (10 g total) by mouth 2 (two) times daily. 01/16/15  Yes Owens Shark, NP  lisinopril (ZESTRIL) 2.5 MG tablet Take 1 tablet (2.5 mg total) by mouth daily. 01/29/15  Yes Delfina Redwood, MD  metFORMIN (GLUCOPHAGE-XR) 500 MG 24 hr tablet TAKE ONE TABLET BY MOUTH TWICE DAILY WITH BREAKFAST AND LUNCH Patient taking differently: Take 500 mg by mouth 2 (two) times daily.  01/01/15  Yes Unk Pinto, MD  Multiple Vitamin (MULTIVITAMIN WITH MINERALS) TABS Take 1 tablet by mouth daily.   Yes Historical Waylon Hershey, MD   ondansetron (ZOFRAN) 4 MG tablet Take 1 tablet (4 mg total) by mouth every 8 (eight) hours as needed for nausea or vomiting. Take prior to Temodar 01/29/15  Yes Delfina Redwood, MD  senna (SENOKOT) 8.6 MG TABS tablet Take 2 tablets (17.2 mg total) by mouth 2 (two) times daily. For constipation. Adjust as  needed Patient taking differently: Take 1 tablet by mouth 2 (two) times daily. For constipation. Adjust as  needed 04/27/14  Yes Ivan Anchors Love, PA-C  temozolomide (TEMODAR) 140 MG capsule Take 2 capsules (280 mg total) by mouth daily. May take on an empty stomach or at bedtime to decrease nausea & vomiting. Patient taking differently: Take 280 mg by mouth daily. May take on an empty stomach or at bedtime to decrease nausea & vomiting. Takes 2 tabs daily for 5 consecutive days, then stops for 28 days with no med, the repeats 5 days, then stops again (continuous) 02/22/15  Yes Ladell Pier, MD  aspirin EC 325 MG EC tablet Take 1 tablet (325 mg total) by mouth daily. 09/26/14   Shanker Kristeen Mans, MD   BP 103/72 mmHg  Pulse 84  Temp(Src) 98 F (36.7 C) (Oral)  Resp 12  Ht 6\' 4"  (1.93 m)  Wt 185 lb (83.915 kg)  BMI 22.53 kg/m2  SpO2 96% Physical Exam  Constitutional: He appears well-developed. No distress.  Elderly, frail  HENT:  Head: Normocephalic and atraumatic.  Right Ear: External ear normal.  Left Ear: External ear normal.  Eyes: Conjunctivae and EOM are normal. Pupils are equal, round, and reactive to light.  Neck: Normal range of motion and phonation normal. Neck supple.  Cardiovascular: Normal rate, regular rhythm and normal heart sounds.   Pulmonary/Chest: Effort normal and breath sounds normal. He exhibits no bony tenderness.  Abdominal: Soft. There is no tenderness.  Musculoskeletal:  Normal passive range of motion, arms and legs bilaterally. No deformity of the left leg, knee or ankle.  Neurological: He is alert. No cranial nerve deficit or sensory deficit. He exhibits  normal muscle tone.  Lethargic.  Skin: Skin is warm, dry and intact.  Psychiatric:  Flat affect  Nursing note and vitals reviewed.   ED Course  Procedures (including critical care time)  Findings discussed with patient's family member who is in the room with him. All questions were answered.  Labs Review Labs Reviewed - No data to display  Imaging Review Dg Knee 2 Views Left  03/02/2015   CLINICAL DATA:  69 year old male with history of trauma from a fall complaining of left-sided knee pain.  EXAM: LEFT KNEE - 1-2 VIEW  COMPARISON:  No priors.  FINDINGS: Multiple views of the left knee demonstrate no acute displaced fracture, subluxation, dislocation, or soft tissue abnormality.  IMPRESSION: No acute radiographic abnormality of the  left knee.   Electronically Signed   By: Vinnie Langton M.D.   On: 03/02/2015 21:53   Dg Hip Unilat With Pelvis 2-3 Views Left  03/02/2015   CLINICAL DATA:  [place] on 02/27/2015.  Left hip pain.  EXAM: DG HIP (WITH OR WITHOUT PELVIS) 2-3V LEFT  COMPARISON:  None.  FINDINGS: There is no evidence of hip fracture or dislocation. There is no evidence of arthropathy or other focal bone abnormality.  IMPRESSION: Negative.   Electronically Signed   By: Lucienne Capers M.D.   On: 03/02/2015 21:54   I have personally reviewed and evaluated these images and lab results as part of my medical decision-making.   EKG Interpretation None      MDM   Final diagnoses:  Pain of left leg    Nonspecific left leg pain, possibly injured in a fall. No evidence for fracture, ligamentous instability or lumbar radiculopathy. Patient is debilitated, possibly with delirium, has been recently evaluated comprehensively in the last 24 hours. He was diagnosed with delirium. He has follow-up care plan by home health services which have been enlisted to come into the home and give assistance.  Nursing Notes Reviewed/ Care Coordinated Applicable Imaging Reviewed Interpretation of  Laboratory Data incorporated into ED treatment  The patient appears reasonably screened and/or stabilized for discharge and I doubt any other medical condition or other [facility] requiring further screening, evaluation, or treatment in the ED at this time prior to discharge.  Plan: Home Medications- Tylenol; Home Treatments- rest; return here if the recommended treatment, does not improve the symptoms; Recommended follow up- PCP prn     Daleen Bo, MD 03/02/15 2316

## 2015-03-02 NOTE — ED Notes (Signed)
Pts wife questioning discharge delay.  Spoke with Dr Oliva Bustard, she would like to speak with patient and family before they are discharged. Advised patient's spouse that md would be in to speak with them shortly.

## 2015-03-02 NOTE — ED Notes (Signed)
Call [facility]

## 2015-03-02 NOTE — ED Notes (Signed)
Pt comes from home via [facility], pt was in nursing home last week for prior stoke, had several falls while there, fell on Wednesday, c/o left hip and knee pain, was not evaluated, pain is getting worse.

## 2015-03-02 NOTE — ED Provider Notes (Signed)
Received care of patient from Dr. Wilson Singer. 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  Per wife patient had similar symptoms of hallucinations after last chemotherapy treatment, and given patient back to baseline, without headache, without new neurologic symptoms, repeating imaging was not ordered.  Patient underwent thorough workup during past admission for similar symptoms.  Given family's concerns over his generalized weakness, inability to walk following his stroke, and their ability to take care of him at home, social work and case management consult and we're currently awaiting their evaluation.  Social work and case management arranged home care for patient and outpatient, face-to-face social work. Wife states understanding. Pt HDS, at baseline, no significant laboratory abnormalities or signs of infection. Patient discharged in stable condition with understanding of reasons to return.   Gareth Morgan, MD 03/03/15 1044

## 2015-03-02 NOTE — Discharge Instructions (Signed)
Take Tylenol as needed for pain.  The home health service, [facility], will be contacting you to arrange in-home health assessment and treatment.

## 2015-03-02 NOTE — Discharge Instructions (Signed)

## 2015-03-02 NOTE — ED Notes (Signed)
Safety sitter at bedside. Pt given breakfast. Pt is pleasantly confused, in NAD

## 2015-03-02 NOTE — Progress Notes (Signed)
10:05am. CSW received consult from medical team to discuss long term placement options with family.  CSW met with pt and wife at bedside. Family unable to afford private-pay at a nursing home. Long-term Medicaid is the only funding source that could pay for such care. They do not have Medicaid right now. CSW discussed Medicaid application process and spend down process; provided handout. CSW also provided a list of private duty sitters. CSW staffed case with CM. Home health recommended--RN, aide, and Education officer, museum. CM stated she would speak with pt and family.   CSW to sign off.  [place] Worker [facility] phone: [phone]

## 2015-03-03 NOTE — ED Notes (Signed)
PTAR here to get patient at this time. 

## 2015-03-04 ENCOUNTER — Telehealth: Payer: Self-pay | Admitting: *Deleted

## 2015-03-04 ENCOUNTER — Telehealth: Payer: Self-pay | Admitting: Oncology

## 2015-03-04 NOTE — Telephone Encounter (Signed)
spoke with patient and she is aware of his appointment

## 2015-03-04 NOTE — Telephone Encounter (Signed)
Message from [place] with hospice requesting an order to see pt for palliative care. Received request from pt's wife today.

## 2015-03-04 NOTE — Telephone Encounter (Signed)
Message from pt's wife pt's wife with an update on his visits to ED and mental status changes. Returned call: Wife reports his hallucinations have stopped. Seems more alert and no longer confused. Pt able to converse appropriately today and even asked for his walker. He is currently unable to bear any weight. Wearing Depends now, unable to transfer pt to bedside commode.  [place] is scheduled to come out 9/27. PT should be coming out to the home, plan is to apply a brace to LLE. Wife thinks he is too weak to participate in PT at this time. Wife does not feel comfortable with resuming Temodar since he has had an issue after both cycles of the drug. Ivan Stark reports she called Hospice to discuss palliative care.

## 2015-03-04 NOTE — Telephone Encounter (Signed)
Reviewed earlier notes with Dr. Benay Spice: Will see pt in office on 9/28.

## 2015-03-06 ENCOUNTER — Ambulatory Visit (HOSPITAL_BASED_OUTPATIENT_CLINIC_OR_DEPARTMENT_OTHER): Payer: Medicare Other | Admitting: Oncology

## 2015-03-06 ENCOUNTER — Telehealth: Payer: Self-pay | Admitting: *Deleted

## 2015-03-06 ENCOUNTER — Telehealth: Payer: Self-pay | Admitting: Oncology

## 2015-03-06 VITALS — BP 121/70 | HR 88 | Temp 98.3°F | Resp 18 | Ht 76.0 in

## 2015-03-06 DIAGNOSIS — C61 Malignant neoplasm of prostate: Secondary | ICD-10-CM

## 2015-03-06 DIAGNOSIS — C7931 Secondary malignant neoplasm of brain: Secondary | ICD-10-CM | POA: Diagnosis not present

## 2015-03-06 MED ORDER — ALPRAZOLAM 0.5 MG PO TABS: 0.5000 mg | ORAL_TABLET | Freq: Two times a day (BID) | ORAL | Status: DC | PRN

## 2015-03-06 NOTE — Telephone Encounter (Signed)
Called order to [facility] in hospice admissions. Verbal order given for palliative care consult/ admission. According to Leonard Downing, pt can still receive home health services while on palliative care. Home Health should handle any equipment needs. Palliative care team will call pt today to schedule a visit.  Message from [place] with [facility] requesting a return call. Unsure if pt is appropriate for home health. Returned call to [place] with [facility] to inform her of plan. Informed her pt will have scan next week. Requested they assist with equipment needs, nursing, PT and OT. Pt needs hospital bed and trapeze bar.

## 2015-03-06 NOTE — Progress Notes (Signed)
[place] OFFICE PROGRESS NOTE   Diagnosis: Small cell carcinoma the prostate  INTERVAL HISTORY:   Ivan Stark completed a second cycle of temozolomide beginning 02/20/2015. He developed hallucinations and agitation while at the nursing facility on 03/02/2015 and was evaluated in the emergency room. He was diagnosed with delirium. He continues to have intermittent confusion at home, but this has improved. He has returned home from the skilled nursing facility. His legs are "weaker ". He cannot ambulate. He is intermittently incontinent of urine. His wife had to get help getting him in the car today.  He reports discomfort at the left hip area when standing. A plain x-ray 03/02/2015 was negative.  No nausea, mouth sores, or diarrhea following temozolomide.  Objective:  Vital signs in last 24 hours:  Blood pressure 121/70, pulse 88, temperature 98.3 F (36.8 C), temperature source Oral, resp. rate 18, height 6\' 4"  (1.93 m), SpO2 100 %.    HEENT: No thrush or ulcers. Lymphatics: 1 cm right submandibular node Resp: Lungs clear bilaterally Cardio: Regular rate and rhythm GI: No hepatosplenomegaly Vascular: No leg edema Neuro: Alert, not oriented to diagnosis, month, or day. Follows commands. The motor exam appears intact in the upper extremities bilaterally. There is 3/5 strength throughout the left leg. He was unable to stand with a 2 person assist.  Lab Results:  Lab Results  Component Value Date   WBC 6.0 03/02/2015   HGB 14.9 03/02/2015   HCT 44.9 03/02/2015   MCV 90.2 03/02/2015   PLT 229 03/02/2015   NEUTROABS 3.8 03/02/2015      Imaging:  Dg Knee 2 Views Left  03/02/2015   CLINICAL DATA:  69 year old male with history of trauma from a fall complaining of left-sided knee pain.  EXAM: LEFT KNEE - 1-2 VIEW  COMPARISON:  No priors.  FINDINGS: Multiple views of the left knee demonstrate no acute displaced fracture, subluxation, dislocation, or soft tissue  abnormality.  IMPRESSION: No acute radiographic abnormality of the left knee.   Electronically Signed   By: Vinnie Langton M.D.   On: 03/02/2015 21:53   Dg Hip Unilat With Pelvis 2-3 Views Left  03/02/2015   CLINICAL DATA:  [place] on 02/27/2015.  Left hip pain.  EXAM: DG HIP (WITH OR WITHOUT PELVIS) 2-3V LEFT  COMPARISON:  None.  FINDINGS: There is no evidence of hip fracture or dislocation. There is no evidence of arthropathy or other focal bone abnormality.  IMPRESSION: Negative.   Electronically Signed   By: Lucienne Capers M.D.   On: 03/02/2015 21:54    Medications: I have reviewed the patient's current medications.  Assessment/Plan: 1. 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, status post resection 04/06/2014  Staging CTs of the chest, abdomen, and pelvis 04/12/2014 confirmed a prostate mass  Prostate ultrasound 04/16/2014 confirmed a prostate mass with a biopsy consistent with small cell carcinoma  Extrinsic appearing cecal mass confirmed on colonoscopy 04/19/2014 with a biopsy revealing small cell carcinoma  Whole brain and prostate radiation 04/24/2014 through 05/16/2014  Cycle 1 carboplatin/etoposide beginning 05/22/2014 with Neulasta support  Cycle 2 carboplatin/etoposide 06/19/2014  Cycle 3 carboplatin/etoposide 07/17/2014  Restaging CT scans 08/10/2014 with near-complete resolution of the large mass involving the prostate gland; resolution of inferior perirectal and right ileocolonic mesenteric lymph nodes; progressive right internal iliac/presacral adenopathy and new small bilateral adrenal nodules; persistent soft tissue nodule involving the medial wall of the cecum.  Cycle 4 carboplatin/etoposide 08/14/2014  MRI brain 09/18/2014 with  interval collapse of the bilateral posterior frontal resection cavities. Minimal enhancement at the right frontal resection site has decreased. Increased enhancement along the margins of the left frontal resection  cavity favored to be postoperative. Small amount of new non-mass-like enhancement more posteriorly in the paramedian right frontoparietal region likely postoperative/post ischemic. Decreased size of 7 mm falcine mass. No evidence of new intracranial metastases.  cycle 5 carboplatin/etoposide 09/24/2014  CTs 10/10/2014 revealed enlargement of bilateral adrenal nodules, no other evidence of disease progression  CT 12/27/2014 with enlargement of adrenal metastases, a new peritoneal implant, and a recurrent iliac node   Cycle 1 temozolomide 01/11/2015  Cycle 2 temozolomide 02/20/2015 2. Left frontal meningioma, WHO grade 1, status post resection 04/06/2014 3. Left leg weakness secondary to #1, progressive 4. 1 cmbrain lesion at the inferior falx noted on the MRI 03/21/2014, not resected 5. Diabetes  6. Hypertension  7. Hyperlipidemia 8. Neutropenia following cycle 1 carboplatin/etoposide. 9. Hospitalization with headache and weakness 01/26/2015 through 01/29/2015. 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 10. Altered mental status, most likely secondary to surgery/brain radiation   Disposition:  Ivan Stark has completed 2 cycles of salvage therapy with temozolomide. He has developed progressive left leg weakness and is now unable to ambulate. He is confused. The confusion and weakness may be sequelae of the brain surgery/radiation or signs of disease progression. He will be scheduled for a restaging CT evaluation prior to an office visit next week.  It is difficult to care for Ivan Stark in the home. We made a referral to the [facility] palliative care service. We will ask them to assess for equipment needs in the home. He may need skilled nursing facility placement.  Betsy Coder, MD  03/06/2015  10:17 AM

## 2015-03-06 NOTE — Telephone Encounter (Signed)
Gave and printed avs for pt and gv barium

## 2015-03-07 ENCOUNTER — Telehealth: Payer: Self-pay | Admitting: *Deleted

## 2015-03-07 DIAGNOSIS — D329 Benign neoplasm of meninges, unspecified: Secondary | ICD-10-CM | POA: Diagnosis not present

## 2015-03-07 DIAGNOSIS — E1142 Type 2 diabetes mellitus with diabetic polyneuropathy: Secondary | ICD-10-CM | POA: Diagnosis not present

## 2015-03-07 DIAGNOSIS — R296 Repeated falls: Secondary | ICD-10-CM | POA: Diagnosis not present

## 2015-03-07 DIAGNOSIS — F419 Anxiety disorder, unspecified: Secondary | ICD-10-CM | POA: Diagnosis not present

## 2015-03-07 DIAGNOSIS — C61 Malignant neoplasm of prostate: Secondary | ICD-10-CM | POA: Diagnosis not present

## 2015-03-07 DIAGNOSIS — M6281 Muscle weakness (generalized): Secondary | ICD-10-CM | POA: Diagnosis not present

## 2015-03-07 NOTE — Telephone Encounter (Signed)
[facility] called and requested an order to start care on patient with visits 2 x a week x 1 week, 3 x a week x 1 week, 2 x a week x 2 weeks.  Due to patient being incontinent, OK for condom cath and the visits per Dr Melford Aase.

## 2015-03-08 ENCOUNTER — Other Ambulatory Visit: Payer: Medicare Other

## 2015-03-08 ENCOUNTER — Ambulatory Visit: Payer: Medicare Other | Admitting: Nurse Practitioner

## 2015-03-11 ENCOUNTER — Ambulatory Visit (HOSPITAL_COMMUNITY)
Admission: RE | Admit: 2015-03-11 | Discharge: 2015-03-11 | Disposition: A | Discharge status: Home / Self Care | Payer: Medicare Other | Source: Ambulatory Visit | Attending: Oncology | Admitting: Oncology

## 2015-03-11 ENCOUNTER — Encounter: Payer: Self-pay | Admitting: Nurse Practitioner

## 2015-03-11 ENCOUNTER — Observation Stay (HOSPITAL_COMMUNITY)
Admission: AD | Admit: 2015-03-11 | Discharge: 2015-03-13 | Disposition: A | Discharge status: Home / Self Care | Payer: Medicare Other | Source: Ambulatory Visit | Attending: Internal Medicine | Admitting: Internal Medicine

## 2015-03-11 ENCOUNTER — Other Ambulatory Visit: Payer: Self-pay | Admitting: Oncology

## 2015-03-11 ENCOUNTER — Telehealth: Payer: Self-pay | Admitting: *Deleted

## 2015-03-11 ENCOUNTER — Ambulatory Visit (HOSPITAL_BASED_OUTPATIENT_CLINIC_OR_DEPARTMENT_OTHER): Payer: Medicare Other | Admitting: Nurse Practitioner

## 2015-03-11 ENCOUNTER — Other Ambulatory Visit (HOSPITAL_BASED_OUTPATIENT_CLINIC_OR_DEPARTMENT_OTHER): Payer: Medicare Other

## 2015-03-11 ENCOUNTER — Encounter (HOSPITAL_COMMUNITY): Payer: Self-pay | Admitting: *Deleted

## 2015-03-11 VITALS — BP 133/77 | HR 101 | Temp 97.6°F | Resp 20 | Ht 76.0 in | Wt 172.4 lb

## 2015-03-11 DIAGNOSIS — C7931 Secondary malignant neoplasm of brain: Secondary | ICD-10-CM

## 2015-03-11 DIAGNOSIS — C7951 Secondary malignant neoplasm of bone: Secondary | ICD-10-CM | POA: Diagnosis not present

## 2015-03-11 DIAGNOSIS — G9340 Encephalopathy, unspecified: Secondary | ICD-10-CM | POA: Diagnosis not present

## 2015-03-11 DIAGNOSIS — Z923 Personal history of irradiation: Secondary | ICD-10-CM | POA: Insufficient documentation

## 2015-03-11 DIAGNOSIS — C61 Malignant neoplasm of prostate: Secondary | ICD-10-CM

## 2015-03-11 DIAGNOSIS — Z86010 Personal history of colonic polyps: Secondary | ICD-10-CM | POA: Insufficient documentation

## 2015-03-11 DIAGNOSIS — R443 Hallucinations, unspecified: Principal | ICD-10-CM | POA: Insufficient documentation

## 2015-03-11 DIAGNOSIS — R627 Adult failure to thrive: Secondary | ICD-10-CM | POA: Diagnosis not present

## 2015-03-11 DIAGNOSIS — Z85841 Personal history of malignant neoplasm of brain: Secondary | ICD-10-CM | POA: Insufficient documentation

## 2015-03-11 DIAGNOSIS — C7800 Secondary malignant neoplasm of unspecified lung: Secondary | ICD-10-CM | POA: Diagnosis not present

## 2015-03-11 DIAGNOSIS — K219 Gastro-esophageal reflux disease without esophagitis: Secondary | ICD-10-CM | POA: Diagnosis not present

## 2015-03-11 DIAGNOSIS — E785 Hyperlipidemia, unspecified: Secondary | ICD-10-CM | POA: Diagnosis not present

## 2015-03-11 DIAGNOSIS — R4182 Altered mental status, unspecified: Secondary | ICD-10-CM | POA: Diagnosis not present

## 2015-03-11 DIAGNOSIS — E559 Vitamin D deficiency, unspecified: Secondary | ICD-10-CM | POA: Diagnosis not present

## 2015-03-11 DIAGNOSIS — R451 Restlessness and agitation: Secondary | ICD-10-CM | POA: Insufficient documentation

## 2015-03-11 DIAGNOSIS — Z8673 Personal history of transient ischemic attack (TIA), and cerebral infarction without residual deficits: Secondary | ICD-10-CM | POA: Diagnosis not present

## 2015-03-11 DIAGNOSIS — E119 Type 2 diabetes mellitus without complications: Secondary | ICD-10-CM | POA: Diagnosis not present

## 2015-03-11 DIAGNOSIS — I10 Essential (primary) hypertension: Secondary | ICD-10-CM | POA: Diagnosis not present

## 2015-03-11 DIAGNOSIS — C785 Secondary malignant neoplasm of large intestine and rectum: Secondary | ICD-10-CM | POA: Insufficient documentation

## 2015-03-11 LAB — COMPREHENSIVE METABOLIC PANEL (CC13)
ALT: 23 U/L (ref 0–55)
ANION GAP: 7 meq/L (ref 3–11)
AST: 18 U/L (ref 5–34)
Albumin: 2.8 g/dL — ABNORMAL LOW (ref 3.5–5.0)
Alkaline Phosphatase: 119 U/L (ref 40–150)
BILIRUBIN TOTAL: 0.52 mg/dL (ref 0.20–1.20)
BUN: 12.9 mg/dL (ref 7.0–26.0)
CHLORIDE: 104 meq/L (ref 98–109)
CO2: 26 meq/L (ref 22–29)
Calcium: 9.6 mg/dL (ref 8.4–10.4)
Creatinine: 0.8 mg/dL (ref 0.7–1.3)
Glucose: 128 mg/dl (ref 70–140)
Potassium: 4.2 mEq/L (ref 3.5–5.1)
Sodium: 138 mEq/L (ref 136–145)
TOTAL PROTEIN: 6.3 g/dL — AB (ref 6.4–8.3)

## 2015-03-11 LAB — CBC WITH DIFFERENTIAL/PLATELET
BASO%: 0.6 % (ref 0.0–2.0)
Basophils Absolute: 0 10*3/uL (ref 0.0–0.1)
EOS ABS: 0.1 10*3/uL (ref 0.0–0.5)
EOS%: 1.5 % (ref 0.0–7.0)
HCT: 45.9 % (ref 38.4–49.9)
HGB: 15 g/dL (ref 13.0–17.1)
LYMPH%: 15.3 % (ref 14.0–49.0)
MCH: 29.4 pg (ref 27.2–33.4)
MCHC: 32.7 g/dL (ref 32.0–36.0)
MCV: 90 fL (ref 79.3–98.0)
MONO#: 0.6 10*3/uL (ref 0.1–0.9)
MONO%: 9.2 % (ref 0.0–14.0)
NEUT#: 5.1 10*3/uL (ref 1.5–6.5)
NEUT%: 73.4 % (ref 39.0–75.0)
PLATELETS: 282 10*3/uL (ref 140–400)
RBC: 5.1 10*6/uL (ref 4.20–5.82)
RDW: 15.5 % — ABNORMAL HIGH (ref 11.0–14.6)
WBC: 6.9 10*3/uL (ref 4.0–10.3)
lymph#: 1.1 10*3/uL (ref 0.9–3.3)

## 2015-03-11 LAB — MAGNESIUM (CC13): Magnesium: 2 mg/dl (ref 1.5–2.5)

## 2015-03-11 MED ORDER — SENNA 8.6 MG PO TABS
1.0000 | ORAL_TABLET | Freq: Two times a day (BID) | ORAL | Status: DC
  Administered 2015-03-11 – 2015-03-13 (×4): 8.6 mg via ORAL
  Filled 2015-03-11 (×4): qty 1

## 2015-03-11 MED ORDER — LORAZEPAM 1 MG PO TABS
1.0000 mg | ORAL_TABLET | Freq: Four times a day (QID) | ORAL | Status: DC | PRN
  Administered 2015-03-11: 1 mg via ORAL
  Filled 2015-03-11: qty 1

## 2015-03-11 MED ORDER — DEXAMETHASONE 4 MG PO TABS
2.0000 mg | ORAL_TABLET | Freq: Every day | ORAL | Status: DC
  Administered 2015-03-12: 2 mg via ORAL
  Filled 2015-03-11: qty 1

## 2015-03-11 MED ORDER — GLUCERNA SHAKE PO LIQD
237.0000 mL | Freq: Two times a day (BID) | ORAL | Status: DC
  Administered 2015-03-12 – 2015-03-13 (×2): 237 mL via ORAL
  Filled 2015-03-11 (×4): qty 237

## 2015-03-11 MED ORDER — LORAZEPAM 0.5 MG PO TABS
0.5000 mg | ORAL_TABLET | Freq: Two times a day (BID) | ORAL | Status: DC
  Administered 2015-03-12 – 2015-03-13 (×3): 0.5 mg via ORAL
  Filled 2015-03-11 (×3): qty 1

## 2015-03-11 MED ORDER — MORPHINE SULFATE (PF) 2 MG/ML IV SOLN: 1.0000 mg | INTRAVENOUS | Status: DC | PRN

## 2015-03-11 MED ORDER — IOHEXOL 300 MG/ML  SOLN
100.0000 mL | Freq: Once | INTRAMUSCULAR | Status: AC | PRN
  Administered 2015-03-11: 100 mL via INTRAVENOUS

## 2015-03-11 MED ORDER — LACTULOSE 10 GM/15ML PO SOLN
10.0000 g | Freq: Two times a day (BID) | ORAL | Status: DC
  Administered 2015-03-11 – 2015-03-13 (×4): 10 g via ORAL
  Filled 2015-03-11 (×4): qty 15

## 2015-03-11 MED ORDER — HYDROCODONE-ACETAMINOPHEN 5-325 MG PO TABS: 1.0000 | ORAL_TABLET | ORAL | Status: DC | PRN

## 2015-03-11 NOTE — Telephone Encounter (Signed)
Page received "Wife called, pt very confused & out of it.  Has CT at 11:00, needs to talk to someone ASAP."

## 2015-03-11 NOTE — Assessment & Plan Note (Signed)
Patient completed cycle 2 of his Temodar oral chemotherapy on 02/20/2015. Patient was admitted to a skilled nursing facility for short time prior to presenting to the emergency department with diagnosis of delirium.  Patient was then discharged home to his wife's care.  Patient's wife states that patient has become more confused, more restless, and minimally sleeps.  Restaging CT of the head and chest/abdomen/pelvis reveal that previously diagnosed brain metastases is stable; but there is progression of disease throughout the patient's chest and abdomen.  Was also noted that patient has compression fracture of either T2 or T12 as well.  Unclear if the compression fracture is traumatic or bone metastasis.  Patient was advised would discontinue any further Temodar oral chemotherapy due to progression.  Due to patient's progressive confusion-patient will be admitted to the hospital this afternoon for further evaluation.  Dr. Benay Spice spoke at length with both the patient and his wife in regards to possibility of hospice/palliative care referral; and subsequent placement in either a skilled nursing facility or [facility].   Brief history and report was given to Dr. Aileen Fass hospitalist prior to transporting the patient to room number 1345, via wheelchair as a direct admit per the [facility].

## 2015-03-11 NOTE — Progress Notes (Signed)
SYMPTOM MANAGEMENT CLINIC   HPI: Ivan Stark 69 y.o. male diagnosed with prostate cancer with brain metastasis.  Currently undergoing Temodar oral therapy.  Patient completed cycle 2 of his Temodar oral chemotherapy on 02/20/2015. Patient was admitted to a skilled nursing facility for short time prior to presenting to the emergency department with diagnosis of delirium.  Patient was then discharged home to his wife's care.  Patient was given a trial of Xanax last week to see if that would help-but patient's wife states that this only allow the patient to rest and be calm for approximately 90 minutes.  Patient's wife states that patient has become more confused, more restless, and minimally sleeps.  Patient is essentially nonambulatory; requiring full assistance with all bathroom needs.  However, he continues to attempt getting up out of this wheelchair or out of the bed frequently.  Both patient and his wife deny any new/recent symptoms or fever/chills.  HPI  ROS  Past Medical History  Diagnosis Date  . Hypertension   . Hyperlipidemia   . GERD (gastroesophageal reflux disease)   . Vitamin D deficiency   . History of colon polyps   . Complication of anesthesia     Difficult to wake up; and severe nausea and vomiting  . PONV (postoperative nausea and vomiting)   . Diabetes mellitus without complication (HCC)     metformin  . Cancer ([place]) 03/2014    prostate ca  . Stroke [facility])     Past Surgical History  Procedure Laterality Date  . Tonsillectomy    . Colonoscopy w/ polypectomy    . Esophagus surgery      Had esophagus stretched due to food getting trapped in his throat  . Craniotomy Bilateral 04/06/2014    Procedure: Bilateral Fronto-parietal craniotomy for resection of tumors with Curve;  Surgeon: Consuella Lose, MD;  Location: [facility];  Service: Neurosurgery;  Laterality: Bilateral;  Bilateral Fronto-parietal craniotomy for resection of tumors with Curve  .  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    is allergic to atenolol and lipitor.    Medication List       This list is accurate as of: 03/11/15  5:13 PM.  Always use your most recent med list.               acetaminophen 325 MG tablet  Commonly known as:  TYLENOL  Take 2 tablets (650 mg total) by mouth every 4 (four) hours as needed for mild pain (or temp >/= 99.5 F).     ALPRAZolam 0.5 MG tablet  Commonly known as:  XANAX  Take 1 tablet (0.5 mg total) by mouth 2 (two) times daily as needed for anxiety.     aspirin EC 81 MG tablet  Take 81 mg by mouth daily.     dexamethasone 4 MG tablet  Commonly known as:  DECADRON     glimepiride 4 MG tablet  Commonly  known as:  AMARYL  Take 0.5 tablets (2 mg total) by mouth 2 (two) times daily.     GLUCERNA Liqd  Take 237 mLs by mouth 2 (two) times daily between meals.     HYDROcodone-acetaminophen 5-325 MG tablet  Commonly known as:  NORCO/VICODIN  Take 1 tablet by mouth every 4 (four) hours as needed for moderate pain.     Lactulose 20 GM/30ML Soln  Take 15 mLs (10 g total) by mouth 2 (two) times daily.     metFORMIN 500 MG 24 hr tablet  Commonly known as:  GLUCOPHAGE-XR  TAKE ONE TABLET BY MOUTH TWICE DAILY WITH BREAKFAST AND LUNCH      multivitamin with minerals Tabs tablet  Take 1 tablet by mouth daily.     ondansetron 4 MG tablet  Commonly known as:  ZOFRAN  Take 1 tablet (4 mg total) by mouth every 8 (eight) hours as needed for nausea or vomiting. Take prior to Temodar     senna 8.6 MG Tabs tablet  Commonly known as:  SENOKOT  Take 2 tablets (17.2 mg total) by mouth 2 (two) times daily. For constipation. Adjust as  needed     temozolomide 140 MG capsule  Commonly known as:  TEMODAR  Take 2 capsules (280 mg total) by mouth daily. May take on an empty stomach or at bedtime to decrease nausea & vomiting.     vitamin C 1000 MG tablet  Take 1,000 mg by mouth daily.     Vitamin D 2000 UNITS Caps  Take 4,000 Units by mouth daily.         PHYSICAL EXAMINATION  Oncology Vitals 03/11/2015 03/11/2015 03/06/2015 03/02/2015 03/02/2015 03/02/2015 03/02/2015  Height 193 cm 193 cm 193 cm - - - 193 cm  Weight 78.019 kg 78.2 kg (No Data) - - - 83.915 kg  Weight (lbs) 172 lbs 172 lbs 6 oz (No Data) - - - 185 lbs  BMI (kg/m2) 20.94 kg/m2 20.99 kg/m2 - - - - 22.52 kg/m2  Temp 97.9 97.6 98.3 98 - - 98  Pulse 96 101 88 86 84 88 91  Resp 20 20 18 16 12 22 24   SpO2 98 100 100 97 96 95 96  BSA (m2) 2.05 m2 2.05 m2 - - - - 2.12 m2   BP Readings from Last 3 Encounters:  03/11/15 116/74  03/11/15 133/77  03/06/15 121/70    Physical Exam  Constitutional:  Patient appears fatigued, weak, frail, and chronically ill.  HENT:  Head: Normocephalic and atraumatic.  Mouth/Throat: Oropharynx is clear and moist.  Eyes: Conjunctivae and EOM are normal. Pupils are equal, round, and reactive to light. Right eye exhibits no discharge. Left eye exhibits no discharge. No scleral icterus.  Neck: Normal range of motion.  Pulmonary/Chest: Effort normal. No respiratory distress.  Neurological:  Patient is able to move both his upper and lower extremities; but they all appear weak.  He will follow simple commands when asked.  He has bilateral equal  grips; although they are slightly weak.  He is unable to stand on his own; but is continually attempting to get out of the wheelchair.  He was observed with occasional angry outbursts as well.  Skin: Skin is warm and dry. There is pallor.  Psychiatric:  See previous neurological assessment note.  Nursing note and vitals reviewed.   LABORATORY DATA:. Appointment on 03/11/2015  Component Date Value Ref Range Status  . WBC 03/11/2015 6.9  4.0 - 10.3 10e3/uL Final  .  NEUT# 03/11/2015 5.1  1.5 - 6.5 10e3/uL Final  . HGB 03/11/2015 15.0  13.0 - 17.1 g/dL Final  . HCT 03/11/2015 45.9  38.4 - 49.9 % Final  . Platelets 03/11/2015 282  140 - 400 10e3/uL Final  . MCV 03/11/2015 90.0  79.3 - 98.0 fL Final  . MCH 03/11/2015 29.4  27.2 - 33.4 pg Final  . MCHC 03/11/2015 32.7  32.0 - 36.0 g/dL Final  . RBC 03/11/2015 5.10  4.20 - 5.82 10e6/uL Final  . RDW 03/11/2015 15.5* 11.0 - 14.6 % Final  . lymph# 03/11/2015 1.1  0.9 - 3.3 10e3/uL Final  . MONO# 03/11/2015 0.6  0.1 - 0.9 10e3/uL Final  . Eosinophils Absolute 03/11/2015 0.1  0.0 - 0.5 10e3/uL Final  . Basophils Absolute 03/11/2015 0.0  0.0 - 0.1 10e3/uL Final  . NEUT% 03/11/2015 73.4  39.0 - 75.0 % Final  . LYMPH% 03/11/2015 15.3  14.0 - 49.0 % Final  . MONO% 03/11/2015 9.2  0.0 - 14.0 % Final  . EOS% 03/11/2015 1.5  0.0 - 7.0 % Final  . BASO% 03/11/2015 0.6  0.0 - 2.0 % Final  . Sodium 03/11/2015 138  136 - 145 mEq/L Final  . Potassium 03/11/2015 4.2  3.5 - 5.1 mEq/L Final  . Chloride 03/11/2015 104  98 - 109 mEq/L Final  . CO2 03/11/2015 26  22 - 29 mEq/L Final  . Glucose 03/11/2015 128  70 - 140 mg/dl Final   Glucose reference range is for nonfasting patients. Fasting glucose reference range is 70- 100.  Marland Kitchen BUN 03/11/2015 12.9  7.0 - 26.0 mg/dL Final  . Creatinine 03/11/2015 0.8  0.7 - 1.3 mg/dL Final  . Total Bilirubin 03/11/2015 0.52  0.20 - 1.20 mg/dL Final  . Alkaline Phosphatase 03/11/2015 119  40 - 150 U/L Final  . AST 03/11/2015  18  5 - 34 U/L Final  . ALT 03/11/2015 23  0 - 55 U/L Final  . Total Protein 03/11/2015 6.3* 6.4 - 8.3 g/dL Final  . Albumin 03/11/2015 2.8* 3.5 - 5.0 g/dL Final  . Calcium 03/11/2015 9.6  8.4 - 10.4 mg/dL Final  . Anion Gap 03/11/2015 7  3 - 11 mEq/L Final  . EGFR 03/11/2015 >90  >90 ml/min/1.73 m2 Final   eGFR is calculated using the CKD-EPI Creatinine Equation (2009)  . Magnesium 03/11/2015 2.0  1.5 - 2.5 mg/dl Final     RADIOGRAPHIC STUDIES: Ct Head W Wo Contrast  03/11/2015   CLINICAL DATA:  69 year old hypertensive male with small cell carcinoma of the prostate. Right frontal metastatic lesion resected 04/06/2014. Left frontal meningioma resected at same time. Post whole-brain radiation therapy completed December 2015. Last chemotherapy 02/20/2015. Restaging. Subsequent encounter.  EXAM: CT HEAD WITHOUT AND WITH CONTRAST  TECHNIQUE: Contiguous axial images were obtained from the base of the skull through the vertex without and with intravenous contrast  CONTRAST:  156m OMNIPAQUE IOHEXOL 300 MG/ML  SOLN  COMPARISON:  02/01/2015 CT.  01/26/2015 MR.  FINDINGS: Postoperative changes superior frontal region bilaterally.  No new area of enhancement to suggest interval development of intracranial metastatic disease. If further delineation is clinically desired, MR imaging with contrast may be considered (more sensitive for subtle changes).  Similar appearance of 1 cm anterior falx calcified meningioma and plaque-like calcification.  No calvarial abnormality.  Prominent white matter changes may reflect result of treatment of tumor and/ or small vessel disease.  Mild global atrophy without hydrocephalus.  No CT evidence of large  acute infarct.  No intracranial hemorrhage.  Vascular calcifications.  Minimal partial opacification inferior mastoid air cells.  IMPRESSION: Postop changes frontal lobes appear stable.  No new area of enhancement to suggest interval development of intracranial metastatic disease.   Stable 1 cm calcified falx meningioma.  Prominent white matter changes which may reflect result treatment tumor and/ or small vessel disease.   Electronically Signed   By: Genia Del M.D.   On: 03/11/2015 13:59   Ct Chest W Contrast  03/11/2015   CLINICAL DATA:  Subsequent encounter for prostate cancer with brain metastases.  EXAM: CT CHEST, ABDOMEN AND PELVIS WITHOUT CONTRAST  TECHNIQUE: Multidetector CT imaging of the chest, abdomen and pelvis was performed following the standard protocol without IV contrast.  COMPARISON:  12/27/2014.  FINDINGS: CT CHEST FINDINGS  Mediastinum/Lymph Nodes: There is no axillary lymphadenopathy. No mediastinal lymphadenopathy. There is no hilar lymphadenopathy. The heart size is normal. No pericardial effusion. Coronary artery calcification is noted.  Bilateral thyroid nodules are again identified, left greater than right. A discrete right-sided nodule has progressed in the interval, measuring 19 mm today compared to 11 mm previously.  Lungs/Pleura: Architectural detail distorted by breathing motion. 4 mm left apical nodule seen previously is 3 mm today (image 9 series 7). No new pulmonary parenchymal lesions.  Musculoskeletal: A 2 cm nodule is seen in the posterior left chest wall between the seventh and eighth ribs (image 28 series 4). This is new in the interval. T2 compression fracture is new in the interval. No evidence for bony retropulsion of fragments into the spinal canal. No underlying lytic or sclerotic lesion is evident at this level.  CT ABDOMEN PELVIS FINDINGS  Hepatobiliary: No focal abnormality within the liver parenchyma. There is no evidence for gallstones, gallbladder wall thickening, or pericholecystic fluid. No intrahepatic or extrahepatic biliary dilation.  Pancreas: No focal mass lesion. No dilatation of the main duct. No intraparenchymal cyst. No peripancreatic edema.  Spleen: 2.1 cm focal hypo attenuating lesion is identified in the central spleen  (image 9 series 8).  Adrenals/Urinary Tract: Interval progression of right adrenal mass, now measuring 4.4 x 5.9 cm compared to 2.2 x 3.9 cm previously. Left adrenal mass has also progressed, measuring 3.2 x 3.6 cm today compared to 2.1 x 2.4 cm previously. No focal abnormality in either kidney. No hydroureteronephrosis. The urinary bladder appears normal for the degree of distention.  Stomach/Bowel: Small to moderate hiatal hernia noted. Stomach otherwise unremarkable. Duodenum is normally positioned as is the ligament of Treitz. No small bowel wall thickening. No small bowel dilatation. The terminal ileum is normal. The appendix is normal. Diverticular changes are noted in the left colon without evidence of diverticulitis. Prominent stool volume noted throughout the colon.  Vascular/Lymphatic: There is abdominal aortic atherosclerosis without aneurysm. 2.2 cm omental nodule on image 61 measured only 6 mm previously. 17 mm left upper quadrant soft tissue nodule seen previously has progressed, now measuring 3.8 x 3.9 cm. A new 2.2 cm nodule is seen anterior to the aortic bifurcation. 3.5 x 2.1 cm right internal iliac lymph node was 1.4 x 1.5 cm previously.  Reproductive: The prostate gland and seminal vesicles have normal imaging features.  Other: No intraperitoneal free fluid.  Musculoskeletal: Bone windows reveal no worrisome lytic or sclerotic osseous lesions.  IMPRESSION: 1. Interval development of 2 cm nodule in the posterior left chest wall compatible with metastatic involvement. 2. Enlarging right thyroid nodule, nonspecific but continued attention on follow-up recommended. 3. Interval progression  of bilateral adrenal metastases. 4. Interval progression of omental, mesenteric, and pelvic sidewall metastases. 5. Interval development of a 2.1 cm focal splenic lesion, compatible with metastatic disease. 6. New compression deformity of the T12 vertebral body without underlying bony lesion evident. No evidence for  posterior retropulsion of fragments into the spinal canal this level.   Electronically Signed   By: Misty Stanley M.D.   On: 03/11/2015 13:45   Ct Abdomen Pelvis W Contrast  03/11/2015   CLINICAL DATA:  Subsequent encounter for prostate cancer with brain metastases.  EXAM: CT CHEST, ABDOMEN AND PELVIS WITHOUT CONTRAST  TECHNIQUE: Multidetector CT imaging of the chest, abdomen and pelvis was performed following the standard protocol without IV contrast.  COMPARISON:  12/27/2014.  FINDINGS: CT CHEST FINDINGS  Mediastinum/Lymph Nodes: There is no axillary lymphadenopathy. No mediastinal lymphadenopathy. There is no hilar lymphadenopathy. The heart size is normal. No pericardial effusion. Coronary artery calcification is noted.  Bilateral thyroid nodules are again identified, left greater than right. A discrete right-sided nodule has progressed in the interval, measuring 19 mm today compared to 11 mm previously.  Lungs/Pleura: Architectural detail distorted by breathing motion. 4 mm left apical nodule seen previously is 3 mm today (image 9 series 7). No new pulmonary parenchymal lesions.  Musculoskeletal: A 2 cm nodule is seen in the posterior left chest wall between the seventh and eighth ribs (image 28 series 4). This is new in the interval. T2 compression fracture is new in the interval. No evidence for bony retropulsion of fragments into the spinal canal. No underlying lytic or sclerotic lesion is evident at this level.  CT ABDOMEN PELVIS FINDINGS  Hepatobiliary: No focal abnormality within the liver parenchyma. There is no evidence for gallstones, gallbladder wall thickening, or pericholecystic fluid. No intrahepatic or extrahepatic biliary dilation.  Pancreas: No focal mass lesion. No dilatation of the main duct. No intraparenchymal cyst. No peripancreatic edema.  Spleen: 2.1 cm focal hypo attenuating lesion is identified in the central spleen (image 9 series 8).  Adrenals/Urinary Tract: Interval progression of  right adrenal mass, now measuring 4.4 x 5.9 cm compared to 2.2 x 3.9 cm previously. Left adrenal mass has also progressed, measuring 3.2 x 3.6 cm today compared to 2.1 x 2.4 cm previously. No focal abnormality in either kidney. No hydroureteronephrosis. The urinary bladder appears normal for the degree of distention.  Stomach/Bowel: Small to moderate hiatal hernia noted. Stomach otherwise unremarkable. Duodenum is normally positioned as is the ligament of Treitz. No small bowel wall thickening. No small bowel dilatation. The terminal ileum is normal. The appendix is normal. Diverticular changes are noted in the left colon without evidence of diverticulitis. Prominent stool volume noted throughout the colon.  Vascular/Lymphatic: There is abdominal aortic atherosclerosis without aneurysm. 2.2 cm omental nodule on image 61 measured only 6 mm previously. 17 mm left upper quadrant soft tissue nodule seen previously has progressed, now measuring 3.8 x 3.9 cm. A new 2.2 cm nodule is seen anterior to the aortic bifurcation. 3.5 x 2.1 cm right internal iliac lymph node was 1.4 x 1.5 cm previously.  Reproductive: The prostate gland and seminal vesicles have normal imaging features.  Other: No intraperitoneal free fluid.  Musculoskeletal: Bone windows reveal no worrisome lytic or sclerotic osseous lesions.  IMPRESSION: 1. Interval development of 2 cm nodule in the posterior left chest wall compatible with metastatic involvement. 2. Enlarging right thyroid nodule, nonspecific but continued attention on follow-up recommended. 3. Interval progression of bilateral adrenal metastases. 4. Interval  progression of omental, mesenteric, and pelvic sidewall metastases. 5. Interval development of a 2.1 cm focal splenic lesion, compatible with metastatic disease. 6. New compression deformity of the T12 vertebral body without underlying bony lesion evident. No evidence for posterior retropulsion of fragments into the spinal canal this  level.   Electronically Signed   By: Misty Stanley M.D.   On: 03/11/2015 13:45    ASSESSMENT/PLAN:    Small cell carcinoma of prostate [facility]) Patient completed cycle 2 of his Temodar oral chemotherapy on 02/20/2015. Patient was admitted to a skilled nursing facility for short time prior to presenting to the emergency department with diagnosis of delirium.  Patient was then discharged home to his wife's care.  Patient's wife states that patient has become more confused, more restless, and minimally sleeps.  Restaging CT of the head and chest/abdomen/pelvis reveal that previously diagnosed brain metastases is stable; but there is progression of disease throughout the patient's chest and abdomen.  Was also noted that patient has compression fracture of either T2 or T12 as well.  Unclear if the compression fracture is traumatic or bone metastasis.  Patient was advised would discontinue any further Temodar oral chemotherapy due to progression.  Due to patient's progressive confusion-patient will be admitted to the hospital this afternoon for further evaluation.  Dr. Benay Spice spoke at length with both the patient and his wife in regards to possibility of hospice/palliative care referral; and subsequent placement in either a skilled nursing facility or [facility].   Brief history and report was given to Dr. Aileen Fass hospitalist prior to transporting the patient to room number 1345, via wheelchair as a direct admit per the [facility].  Altered mental state Patient completed cycle 2 of his Temodar oral chemotherapy on 02/20/2015. Patient was admitted to a skilled nursing facility for short time prior to presenting to the emergency department with diagnosis of delirium.  Patient was then discharged home to his wife's care.  Patient was given a trial of Xanax last week to see if that would help-but patient's wife states that this only allow the patient to rest and be calm for approximately 90  minutes.  Patient's wife states that patient has become more confused, more restless, and minimally sleeps.  Patient is essentially nonambulatory; requiring full assistance with all bathroom needs.  However, he continues to attempt getting up out of this wheelchair or out of the bed frequently.  On exam.-Patient will answer simple questions occasionally; but otherwise appears confused.  Restaging CT of the head and chest/abdomen/pelvis reveal that previously diagnosed brain metastases is stable; but there is progression of disease throughout the patient's chest and abdomen.  Was also noted that patient has compression fracture of either T2 or T12 as well.  Unclear if the compression fracture is traumatic or bone metastasis.  Patient was advised would discontinue any further Temodar oral chemotherapy due to progression.  Due to patient's progressive confusion-patient will be admitted to the hospital this afternoon for further evaluation.  Progressive confusion could be secondary to patient's previous brain surgery or brain radiation.  He could also be delirium/psychosis secondary to sleeplessness.  Dr. Benay Spice spoke at length with both the patient and his wife in regards to possibility of hospice/palliative care referral; and subsequent placement in either a skilled nursing facility or [facility].   Brief history and report was given to Dr. Aileen Fass hospitalist prior to transporting the patient to room number 1345, via wheelchair as a direct admit per the [facility]  nurse.  Patient stated understanding of all instructions; and was in agreement with this plan of care. The patient knows to call the clinic with any problems, questions or concerns.   This was a shared visit.  Dr. Benay Spice today.  Total time spent with patient was 40 minutes;  with greater than 75 percent of that time spent in face to face counseling regarding patient's symptoms,  and coordination of care and follow  up.  Disclaimer:This dictation was prepared with Dragon/digital dictation along with [employer]. Any transcriptional errors that result from this process are unintentional.  Drue Second, NP 03/11/2015   This was a shared visit with Drue Second.  Mr. Hassebrock was interviewed and examined.  He has progressive metastatic small cell carcinoma.  I reviewed the CT findings with Mr. Dearden and his wife.  He has confusion- potentially related to brain radiation or delerium.  I recommend Hospice care. Mrs. Bosler can no longer care for him at home.  He will be admitted for supportive care and placement.  He appears to be a candidate for [employer] [facility].  We discussed code status.  He and his wife would like to discuss this further.  [name], MD

## 2015-03-11 NOTE — H&P (Signed)
[facility] History and Physical  Ivan Stark IZT:[id number] DOB: 08-May-1946 DOA: 03/11/2015  Referring physician:  PCP: Alesia Richards, MD  Specialists:   Chief Complaint: hallucinations, FTT  HPI: Ivan Stark is a 69 y.o. male with PMH of HTN, DM, h/o CVA, Metastatic Small Cell Cancer prostate, colon, lung, bones, brain s/p brain surgery, radiation recently discharged to SNF. Patient progressively declining with FTT, unable to ambulate, recurrent encephalopathy with hallucinations, agitations presented to oncology Dr. Annitta Jersey. Patient was discharged home from SNF but his family was unable to manage at home. Oncology d/w patient, and his family who requested for hospice care evaluation, admission. Patient is lethargic but denies acute pains, no shortness of breath, no nausea, vomiting or diarrhea.  -hospitalist is asked for admission for management for encephalopathy, agitation with hospice care evaluation in AM   Review of Systems: The patient denies anorexia, fever, weight loss,, vision loss, decreased hearing, hoarseness, chest pain, syncope, dyspnea on exertion, peripheral edema, balance deficits, hemoptysis, abdominal pain, melena, hematochezia, severe indigestion/heartburn, hematuria, incontinence, genital sores, muscle weakness, suspicious skin lesions, transient blindness, difficulty walking, depression, unusual weight change, abnormal bleeding, enlarged lymph nodes, angioedema, and breast masses.    Past Medical History  Diagnosis Date  . Hypertension   . Hyperlipidemia   . GERD (gastroesophageal reflux disease)   . Vitamin D deficiency   . History of colon polyps   . Complication of anesthesia     Difficult to wake up; and severe nausea and vomiting  . PONV (postoperative nausea and vomiting)   . Diabetes mellitus without complication (HCC)     metformin  . Cancer ([place]) 03/2014    prostate ca  . Stroke [facility])    Past Surgical History  Procedure Laterality  Date  . Tonsillectomy    . Colonoscopy w/ polypectomy    . Esophagus surgery      Had esophagus stretched due to food getting trapped in his throat  . Craniotomy Bilateral 04/06/2014    Procedure: Bilateral Fronto-parietal craniotomy for resection of tumors with Curve;  Surgeon: Consuella Lose, MD;  Location: [facility] NEURO ORS;  Service: Neurosurgery;  Laterality: Bilateral;  Bilateral Fronto-parietal craniotomy for resection of tumors with Curve  . Colonoscopy N/A 04/19/2014    Procedure: COLONOSCOPY;  Surgeon: Winfield Cunas., MD;  Location: [facility] ENDOSCOPY;  Service: Endoscopy;  Laterality: N/A;   Social History:  reports that he has never smoked. He does not have any smokeless tobacco history on file. He reports that he does not drink alcohol or use illicit drugs. Home;  where does patient live--home, ALF, SNF? and with whom if at home? No;  Can patient participate in ADLs?  Allergies  Allergen Reactions  . Atenolol Shortness Of Breath  . Lipitor [Atorvastatin] Itching    Family History  Problem Relation Age of Onset  . Goiter Mother   . Hypertension Father   . Cancer Father     colon    (be sure to complete)  Prior to Admission medications   Medication Sig Start Date End Date Taking? Authorizing Provider  acetaminophen (TYLENOL) 325 MG tablet Take 2 tablets (650 mg total) by mouth every 4 (four) hours as needed for mild pain (or temp >/= 99.5 F). 01/29/15  Yes Delfina Redwood, MD  ALPRAZolam Duanne Moron) 0.5 MG tablet Take 1 tablet (0.5 mg total) by mouth 2 (two) times daily as needed for anxiety. 03/06/15  Yes Ladell Pier, MD  Ascorbic Acid (VITAMIN C) 1000 MG  tablet Take 1,000 mg by mouth daily.   Yes Historical Provider, MD  aspirin EC 81 MG tablet Take 81 mg by mouth daily.   Yes Historical Provider, MD  Cholecalciferol (VITAMIN D) 2000 UNITS CAPS Take 4,000 Units by mouth daily.    Yes Historical Provider, MD  glimepiride (AMARYL) 4 MG tablet Take 0.5 tablets (2 mg total)  by mouth 2 (two) times daily. 01/29/15  Yes Delfina Redwood, MD  HYDROcodone-acetaminophen (NORCO/VICODIN) 5-325 MG per tablet Take 1 tablet by mouth every 4 (four) hours as needed for moderate pain. 02/01/15  Yes Jola Schmidt, MD  Lactulose 20 GM/30ML SOLN Take 15 mLs (10 g total) by mouth 2 (two) times daily. 01/16/15  Yes Owens Shark, NP  ondansetron (ZOFRAN) 4 MG tablet Take 1 tablet (4 mg total) by mouth every 8 (eight) hours as needed for nausea or vomiting. Take prior to Temodar 01/29/15  Yes Delfina Redwood, MD  senna (SENOKOT) 8.6 MG TABS tablet Take 2 tablets (17.2 mg total) by mouth 2 (two) times daily. For constipation. Adjust as  needed Patient taking differently: Take 1 tablet by mouth 2 (two) times daily. For constipation. Adjust as  needed 04/27/14  Yes Ivan Anchors Love, PA-C  temozolomide (TEMODAR) 140 MG capsule Take 2 capsules (280 mg total) by mouth daily. May take on an empty stomach or at bedtime to decrease nausea & vomiting. Patient taking differently: Take 280 mg by mouth daily. May take on an empty stomach or at bedtime to decrease nausea & vomiting. Takes 2 tabs daily for 5 consecutive days, then stops for 28 days with no med, the repeats 5 days, then stops again (continuous) 02/22/15  Yes Ladell Pier, MD  dexamethasone (DECADRON) 4 MG tablet  03/01/15   Historical Provider, MD  GLUCERNA (GLUCERNA) LIQD Take 237 mLs by mouth 2 (two) times daily between meals.     Historical Provider, MD  metFORMIN (GLUCOPHAGE-XR) 500 MG 24 hr tablet TAKE ONE TABLET BY MOUTH TWICE DAILY WITH BREAKFAST AND LUNCH Patient taking differently: Take 500 mg by mouth 2 (two) times daily.  01/01/15   Unk Pinto, MD  Multiple Vitamin (MULTIVITAMIN WITH MINERALS) TABS Take 1 tablet by mouth daily.    Historical Provider, MD   Physical Exam: Filed Vitals:   03/11/15 1741  BP: 116/74  Pulse: 96  Temp: 97.9 F (36.6 C)  Resp: 20     General:  Lethargic   Eyes: eom-i  ENT: no oral  ulcers   Neck: supple   Cardiovascular: s1,s2 rrr  Respiratory: CTA BL  Abdomen: soft, nt,nd   Skin: no rash   Musculoskeletal: no leg edema   Psychiatric: hallucinations +  Neurologic: CN 2-12 seems intact. Leg motor 2/5 BL  Labs on Admission:  Basic Metabolic Panel:  Recent Labs Lab 03/11/15 1226  NA 138  K 4.2  CO2 26  GLUCOSE 128  BUN 12.9  CREATININE 0.8  CALCIUM 9.6  MG 2.0   Liver Function Tests:  Recent Labs Lab 03/11/15 1226  AST 18  ALT 23  ALKPHOS 119  BILITOT 0.52  PROT 6.3*  ALBUMIN 2.8*   No results for input(s): LIPASE, AMYLASE in the last 168 hours. No results for input(s): AMMONIA in the last 168 hours. CBC:  Recent Labs Lab 03/11/15 1223  WBC 6.9  NEUTROABS 5.1  HGB 15.0  HCT 45.9  MCV 90.0  PLT 282   Cardiac Enzymes: No results for input(s): CKTOTAL, CKMB, CKMBINDEX, TROPONINI  in the last 168 hours.  BNP (last 3 results) No results for input(s): BNP in the last 8760 hours.  ProBNP (last 3 results) No results for input(s): PROBNP in the last 8760 hours.  CBG: No results for input(s): GLUCAP in the last 168 hours.  Radiological Exams on Admission: Ct Head W Wo Contrast  03/11/2015   CLINICAL DATA:  69 year old hypertensive male with small cell carcinoma of the prostate. Right frontal metastatic lesion resected 04/06/2014. Left frontal meningioma resected at same time. Post whole-brain radiation therapy completed December 2015. Last chemotherapy 02/20/2015. Restaging. Subsequent encounter.  EXAM: CT HEAD WITHOUT AND WITH CONTRAST  TECHNIQUE: Contiguous axial images were obtained from the base of the skull through the vertex without and with intravenous contrast  CONTRAST:  115mL OMNIPAQUE IOHEXOL 300 MG/ML  SOLN  COMPARISON:  02/01/2015 CT.  01/26/2015 MR.  FINDINGS: Postoperative changes superior frontal region bilaterally.  No new area of enhancement to suggest interval development of intracranial metastatic disease. If  further delineation is clinically desired, MR imaging with contrast may be considered (more sensitive for subtle changes).  Similar appearance of 1 cm anterior falx calcified meningioma and plaque-like calcification.  No calvarial abnormality.  Prominent white matter changes may reflect result of treatment of tumor and/ or small vessel disease.  Mild global atrophy without hydrocephalus.  No CT evidence of large acute infarct.  No intracranial hemorrhage.  Vascular calcifications.  Minimal partial opacification inferior mastoid air cells.  IMPRESSION: Postop changes frontal lobes appear stable.  No new area of enhancement to suggest interval development of intracranial metastatic disease.  Stable 1 cm calcified falx meningioma.  Prominent white matter changes which may reflect result treatment tumor and/ or small vessel disease.   Electronically Signed   By: Genia Del M.D.   On: 03/11/2015 13:59   Ct Chest W Contrast  03/11/2015   CLINICAL DATA:  Subsequent encounter for prostate cancer with brain metastases.  EXAM: CT CHEST, ABDOMEN AND PELVIS WITHOUT CONTRAST  TECHNIQUE: Multidetector CT imaging of the chest, abdomen and pelvis was performed following the standard protocol without IV contrast.  COMPARISON:  12/27/2014.  FINDINGS: CT CHEST FINDINGS  Mediastinum/Lymph Nodes: There is no axillary lymphadenopathy. No mediastinal lymphadenopathy. There is no hilar lymphadenopathy. The heart size is normal. No pericardial effusion. Coronary artery calcification is noted.  Bilateral thyroid nodules are again identified, left greater than right. A discrete right-sided nodule has progressed in the interval, measuring 19 mm today compared to 11 mm previously.  Lungs/Pleura: Architectural detail distorted by breathing motion. 4 mm left apical nodule seen previously is 3 mm today (image 9 series 7). No new pulmonary parenchymal lesions.  Musculoskeletal: A 2 cm nodule is seen in the posterior left chest wall between  the seventh and eighth ribs (image 28 series 4). This is new in the interval. T2 compression fracture is new in the interval. No evidence for bony retropulsion of fragments into the spinal canal. No underlying lytic or sclerotic lesion is evident at this level.  CT ABDOMEN PELVIS FINDINGS  Hepatobiliary: No focal abnormality within the liver parenchyma. There is no evidence for gallstones, gallbladder wall thickening, or pericholecystic fluid. No intrahepatic or extrahepatic biliary dilation.  Pancreas: No focal mass lesion. No dilatation of the main duct. No intraparenchymal cyst. No peripancreatic edema.  Spleen: 2.1 cm focal hypo attenuating lesion is identified in the central spleen (image 9 series 8).  Adrenals/Urinary Tract: Interval progression of right adrenal mass, now measuring 4.4 x 5.9  cm compared to 2.2 x 3.9 cm previously. Left adrenal mass has also progressed, measuring 3.2 x 3.6 cm today compared to 2.1 x 2.4 cm previously. No focal abnormality in either kidney. No hydroureteronephrosis. The urinary bladder appears normal for the degree of distention.  Stomach/Bowel: Small to moderate hiatal hernia noted. Stomach otherwise unremarkable. Duodenum is normally positioned as is the ligament of Treitz. No small bowel wall thickening. No small bowel dilatation. The terminal ileum is normal. The appendix is normal. Diverticular changes are noted in the left colon without evidence of diverticulitis. Prominent stool volume noted throughout the colon.  Vascular/Lymphatic: There is abdominal aortic atherosclerosis without aneurysm. 2.2 cm omental nodule on image 61 measured only 6 mm previously. 17 mm left upper quadrant soft tissue nodule seen previously has progressed, now measuring 3.8 x 3.9 cm. A new 2.2 cm nodule is seen anterior to the aortic bifurcation. 3.5 x 2.1 cm right internal iliac lymph node was 1.4 x 1.5 cm previously.  Reproductive: The prostate gland and seminal vesicles have normal imaging  features.  Other: No intraperitoneal free fluid.  Musculoskeletal: Bone windows reveal no worrisome lytic or sclerotic osseous lesions.  IMPRESSION: 1. Interval development of 2 cm nodule in the posterior left chest wall compatible with metastatic involvement. 2. Enlarging right thyroid nodule, nonspecific but continued attention on follow-up recommended. 3. Interval progression of bilateral adrenal metastases. 4. Interval progression of omental, mesenteric, and pelvic sidewall metastases. 5. Interval development of a 2.1 cm focal splenic lesion, compatible with metastatic disease. 6. New compression deformity of the T12 vertebral body without underlying bony lesion evident. No evidence for posterior retropulsion of fragments into the spinal canal this level.   Electronically Signed   By: Misty Stanley M.D.   On: 03/11/2015 13:45   Ct Abdomen Pelvis W Contrast  03/11/2015   CLINICAL DATA:  Subsequent encounter for prostate cancer with brain metastases.  EXAM: CT CHEST, ABDOMEN AND PELVIS WITHOUT CONTRAST  TECHNIQUE: Multidetector CT imaging of the chest, abdomen and pelvis was performed following the standard protocol without IV contrast.  COMPARISON:  12/27/2014.  FINDINGS: CT CHEST FINDINGS  Mediastinum/Lymph Nodes: There is no axillary lymphadenopathy. No mediastinal lymphadenopathy. There is no hilar lymphadenopathy. The heart size is normal. No pericardial effusion. Coronary artery calcification is noted.  Bilateral thyroid nodules are again identified, left greater than right. A discrete right-sided nodule has progressed in the interval, measuring 19 mm today compared to 11 mm previously.  Lungs/Pleura: Architectural detail distorted by breathing motion. 4 mm left apical nodule seen previously is 3 mm today (image 9 series 7). No new pulmonary parenchymal lesions.  Musculoskeletal: A 2 cm nodule is seen in the posterior left chest wall between the seventh and eighth ribs (image 28 series 4). This is new in  the interval. T2 compression fracture is new in the interval. No evidence for bony retropulsion of fragments into the spinal canal. No underlying lytic or sclerotic lesion is evident at this level.  CT ABDOMEN PELVIS FINDINGS  Hepatobiliary: No focal abnormality within the liver parenchyma. There is no evidence for gallstones, gallbladder wall thickening, or pericholecystic fluid. No intrahepatic or extrahepatic biliary dilation.  Pancreas: No focal mass lesion. No dilatation of the main duct. No intraparenchymal cyst. No peripancreatic edema.  Spleen: 2.1 cm focal hypo attenuating lesion is identified in the central spleen (image 9 series 8).  Adrenals/Urinary Tract: Interval progression of right adrenal mass, now measuring 4.4 x 5.9 cm compared to 2.2 x 3.9  cm previously. Left adrenal mass has also progressed, measuring 3.2 x 3.6 cm today compared to 2.1 x 2.4 cm previously. No focal abnormality in either kidney. No hydroureteronephrosis. The urinary bladder appears normal for the degree of distention.  Stomach/Bowel: Small to moderate hiatal hernia noted. Stomach otherwise unremarkable. Duodenum is normally positioned as is the ligament of Treitz. No small bowel wall thickening. No small bowel dilatation. The terminal ileum is normal. The appendix is normal. Diverticular changes are noted in the left colon without evidence of diverticulitis. Prominent stool volume noted throughout the colon.  Vascular/Lymphatic: There is abdominal aortic atherosclerosis without aneurysm. 2.2 cm omental nodule on image 61 measured only 6 mm previously. 17 mm left upper quadrant soft tissue nodule seen previously has progressed, now measuring 3.8 x 3.9 cm. A new 2.2 cm nodule is seen anterior to the aortic bifurcation. 3.5 x 2.1 cm right internal iliac lymph node was 1.4 x 1.5 cm previously.  Reproductive: The prostate gland and seminal vesicles have normal imaging features.  Other: No intraperitoneal free fluid.  Musculoskeletal:  Bone windows reveal no worrisome lytic or sclerotic osseous lesions.  IMPRESSION: 1. Interval development of 2 cm nodule in the posterior left chest wall compatible with metastatic involvement. 2. Enlarging right thyroid nodule, nonspecific but continued attention on follow-up recommended. 3. Interval progression of bilateral adrenal metastases. 4. Interval progression of omental, mesenteric, and pelvic sidewall metastases. 5. Interval development of a 2.1 cm focal splenic lesion, compatible with metastatic disease. 6. New compression deformity of the T12 vertebral body without underlying bony lesion evident. No evidence for posterior retropulsion of fragments into the spinal canal this level.   Electronically Signed   By: Misty Stanley M.D.   On: 03/11/2015 13:45    EKG: Independently reviewed.   Assessment/Plan Active Problems:   Hypertension   Brain metastases (HCC)   Small cell carcinoma of prostate ([place])   69 y.o. male with PMH of HTN, DM, h/o CVA, CHF, Metastatic Small Cell Cancer prostate, colon, lung, bones, brain s/p brain surgery, radiation recently discharged to SNF. Patient progressively declining with FTT, unable to ambulate, recurrent encephalopathy with hallucinations, agitations  1. Acute encephalopathy, agitation, hallucinations in the setting of metastatic cancer brain mets/radiation. We will start scheduled ativan. And prn. Titrate as needed. May need haldol if uncontrolled  2. Metastatic Small Cell Cancer prostate, colon, lung, bones, brain s/p brain surgery, radiation. Repeat CT abd/chest: progression of cancer to adrenals, chest, thyroid, bones, spleen, omental lesions. FTT. Recent CVA, at risk for recurrent CVA. Unable to walk -prognosis remains very poor. Patient, family, oncology discussions, confirmed. Patient is DNR. Requested palliative hospice care evaluation. Cont symptoms control     Palliative care.  if consultant consulted, please document name and whether  formally or informally consulted  Code Status: DNR (must indicate code status--if unknown or must be presumed, indicate so) Family Communication: d/w patient, his wife  (indicate person spoken with, if applicable, with phone number if by telephone) Disposition Plan: pend hospice eval  (indicate anticipated LOS)  Time spent: >45 minutes   Ivan Stark [facility] Pager [phone]  If 7PM-7AM, please contact night-coverage [email or web address] Password [email or web address] 03/11/2015, 6:07 PM

## 2015-03-11 NOTE — Telephone Encounter (Signed)
Called [phone].  "He's been up off and on all night.  Checking his blood pressure.  Asking if people know where he is.  Trying to get out of bed and he can't walk.  He's talking out of his head and his eyes look wild.  I called at 0645 this morning and was told to give him a xanax which calmed him for a little while.   I have people coming to take him to the scans.  What do I need to do to help him?  He's been like this since he came home from the nursing facility.  I'm at my wits end.  He needs to spend some time in the hospital."    Dr. Benay Spice notified.  Verbal orders received and read back for him to go to scans and see Selena Lesser NP after scans.  P.O.F. Generated.

## 2015-03-11 NOTE — Assessment & Plan Note (Signed)
Patient completed cycle 2 of his Temodar oral chemotherapy on 02/20/2015. Patient was admitted to a skilled nursing facility for short time prior to presenting to the emergency department with diagnosis of delirium.  Patient was then discharged home to his wife's care.  Patient was given a trial of Xanax last week to see if that would help-but patient's wife states that this only allow the patient to rest and be calm for approximately 90 minutes.  Patient's wife states that patient has become more confused, more restless, and minimally sleeps.  Patient is essentially nonambulatory; requiring full assistance with all bathroom needs.  However, he continues to attempt getting up out of this wheelchair or out of the bed frequently.  On exam.-Patient will answer simple questions occasionally; but otherwise appears confused.  Restaging CT of the head and chest/abdomen/pelvis reveal that previously diagnosed brain metastases is stable; but there is progression of disease throughout the patient's chest and abdomen.  Was also noted that patient has compression fracture of either T2 or T12 as well.  Unclear if the compression fracture is traumatic or bone metastasis.  Patient was advised would discontinue any further Temodar oral chemotherapy due to progression.  Due to patient's progressive confusion-patient will be admitted to the hospital this afternoon for further evaluation.  Progressive confusion could be secondary to patient's previous brain surgery or brain radiation.  He could also be delirium/psychosis secondary to sleeplessness.  Dr. Benay Spice spoke at length with both the patient and his wife in regards to possibility of hospice/palliative care referral; and subsequent placement in either a skilled nursing facility or [facility].   Brief history and report was given to Dr. Aileen Fass hospitalist prior to transporting the patient to room number 1345, via wheelchair as a direct admit per the [facility].

## 2015-03-11 NOTE — Telephone Encounter (Signed)
Called return number per Pager [phone].  No answer and no voicemail set up yet.

## 2015-03-12 DIAGNOSIS — R443 Hallucinations, unspecified: Secondary | ICD-10-CM | POA: Diagnosis not present

## 2015-03-12 DIAGNOSIS — C7931 Secondary malignant neoplasm of brain: Secondary | ICD-10-CM | POA: Diagnosis not present

## 2015-03-12 DIAGNOSIS — G9340 Encephalopathy, unspecified: Secondary | ICD-10-CM | POA: Diagnosis not present

## 2015-03-12 DIAGNOSIS — R531 Weakness: Secondary | ICD-10-CM

## 2015-03-12 DIAGNOSIS — C61 Malignant neoplasm of prostate: Secondary | ICD-10-CM | POA: Diagnosis not present

## 2015-03-12 MED ORDER — DEXAMETHASONE 4 MG PO TABS
2.0000 mg | ORAL_TABLET | Freq: Two times a day (BID) | ORAL | Status: DC
  Administered 2015-03-12 – 2015-03-13 (×2): 2 mg via ORAL
  Filled 2015-03-12 (×2): qty 1

## 2015-03-12 MED ORDER — OLANZAPINE 5 MG PO TABS
5.0000 mg | ORAL_TABLET | Freq: Every day | ORAL | Status: DC
  Administered 2015-03-12: 5 mg via ORAL
  Filled 2015-03-12: qty 1

## 2015-03-12 NOTE — Progress Notes (Signed)
CSW consulted to assist with residential hospice home placement. Pt sleeping soundly. No family at bedside. Unable to reach spouse by phone. CSW will attempt to meet with pt / spouse in the am.  Werner Lean LCSW [phone]

## 2015-03-12 NOTE — Progress Notes (Signed)
[facility] PROGRESS NOTE  MORTIMER BAIR EUM:[id number] DOB: 1945-09-30 DOA: 03/11/2015 PCP: Alesia Richards, MD  Assessment/Plan: 69 y.o. male with PMH of HTN, DM, h/o CVA, CHF, Metastatic Small Cell Cancer prostate, colon, lung, bones, brain s/p brain surgery, radiation recently discharged to SNF. Patient progressively declining with FTT, unable to ambulate, recurrent encephalopathy with hallucinations, agitations. Family is unable to take care him at home   1. Acute encephalopathy, agitation, hallucinations in the setting of metastatic cancer brain mets/radiation. Patient is more alert, less agitated on ativan started on admission.  -today: Patient is better on scheduled ativan. And prn. Titrate as needed. May need haldol if uncontrolled  2. Metastatic Small Cell Cancer prostate, colon, lung, bones, brain s/p brain surgery, radiation. Repeat CT abd/chest: progression of cancer to adrenals, chest, thyroid, bones, spleen, omental lesions. FTT. Recent CVA, at risk for recurrent CVA. Unable to walk -prognosis remains very poor. Patient, family, oncology discussions, confirmed. Patient is DNR. Requested palliative hospice care evaluation. Cont symptoms control. Likely needs hospice placement     Palliative care. if consultant consulted, please document name and whether formally or informally consulted  Code Status: DNR (must indicate code status--if unknown or must be presumed, indicate so) Family Communication: d/w patient, his wife (indicate person spoken with, if applicable, with phone number if by telephone) Disposition Plan: pend hospice eval (indicate anticipated LOS)   Consultants:  Palliative care  oncology  Procedures:  none  Antibiotics:  none (indicate start date, and stop date if known)  HPI/Subjective: More alert today. No distress  Objective: Filed Vitals:   03/12/15 0558  BP: 132/80  Pulse: 84  Temp: 97.8 F (36.6 C)  Resp: 16     Intake/Output Summary (Last 24 hours) at 03/12/15 0851 Last data filed at 03/12/15 0558  Gross per 24 hour  Intake    400 ml  Output    200 ml  Net    200 ml   Filed Weights   03/11/15 1741  Weight: 78.019 kg (172 lb)    Exam:   General:  Alert, oriented to person,palce only   Cardiovascular: s1,s2 rrr  Respiratory: CTA BL  Abdomen: soft, nt,nd   Musculoskeletal: no leg edema   Data Reviewed: Basic Metabolic Panel:  Recent Labs Lab 03/11/15 1226  NA 138  K 4.2  CO2 26  GLUCOSE 128  BUN 12.9  CREATININE 0.8  CALCIUM 9.6  MG 2.0   Liver Function Tests:  Recent Labs Lab 03/11/15 1226  AST 18  ALT 23  ALKPHOS 119  BILITOT 0.52  PROT 6.3*  ALBUMIN 2.8*   No results for input(s): LIPASE, AMYLASE in the last 168 hours. No results for input(s): AMMONIA in the last 168 hours. CBC:  Recent Labs Lab 03/11/15 1223  WBC 6.9  NEUTROABS 5.1  HGB 15.0  HCT 45.9  MCV 90.0  PLT 282   Cardiac Enzymes: No results for input(s): CKTOTAL, CKMB, CKMBINDEX, TROPONINI in the last 168 hours. BNP (last 3 results) No results for input(s): BNP in the last 8760 hours.  ProBNP (last 3 results) No results for input(s): PROBNP in the last 8760 hours.  CBG: No results for input(s): GLUCAP in the last 168 hours.  No results found for this or any previous visit (from the past 240 hour(s)).   Studies: Ct Head W Wo Contrast  03/11/2015   CLINICAL DATA:  70 year old hypertensive male with small cell carcinoma of the prostate. Right frontal metastatic lesion resected 04/06/2014. Left frontal  meningioma resected at same time. Post whole-brain radiation therapy completed December 2015. Last chemotherapy 02/20/2015. Restaging. Subsequent encounter.  EXAM: CT HEAD WITHOUT AND WITH CONTRAST  TECHNIQUE: Contiguous axial images were obtained from the base of the skull through the vertex without and with intravenous contrast  CONTRAST:  159mL OMNIPAQUE IOHEXOL 300 MG/ML  SOLN   COMPARISON:  02/01/2015 CT.  01/26/2015 MR.  FINDINGS: Postoperative changes superior frontal region bilaterally.  No new area of enhancement to suggest interval development of intracranial metastatic disease. If further delineation is clinically desired, MR imaging with contrast may be considered (more sensitive for subtle changes).  Similar appearance of 1 cm anterior falx calcified meningioma and plaque-like calcification.  No calvarial abnormality.  Prominent white matter changes may reflect result of treatment of tumor and/ or small vessel disease.  Mild global atrophy without hydrocephalus.  No CT evidence of large acute infarct.  No intracranial hemorrhage.  Vascular calcifications.  Minimal partial opacification inferior mastoid air cells.  IMPRESSION: Postop changes frontal lobes appear stable.  No new area of enhancement to suggest interval development of intracranial metastatic disease.  Stable 1 cm calcified falx meningioma.  Prominent white matter changes which may reflect result treatment tumor and/ or small vessel disease.   Electronically Signed   By: Genia Del M.D.   On: 03/11/2015 13:59   Ct Chest W Contrast  03/11/2015   CLINICAL DATA:  Subsequent encounter for prostate cancer with brain metastases.  EXAM: CT CHEST, ABDOMEN AND PELVIS WITHOUT CONTRAST  TECHNIQUE: Multidetector CT imaging of the chest, abdomen and pelvis was performed following the standard protocol without IV contrast.  COMPARISON:  12/27/2014.  FINDINGS: CT CHEST FINDINGS  Mediastinum/Lymph Nodes: There is no axillary lymphadenopathy. No mediastinal lymphadenopathy. There is no hilar lymphadenopathy. The heart size is normal. No pericardial effusion. Coronary artery calcification is noted.  Bilateral thyroid nodules are again identified, left greater than right. A discrete right-sided nodule has progressed in the interval, measuring 19 mm today compared to 11 mm previously.  Lungs/Pleura: Architectural detail distorted by  breathing motion. 4 mm left apical nodule seen previously is 3 mm today (image 9 series 7). No new pulmonary parenchymal lesions.  Musculoskeletal: A 2 cm nodule is seen in the posterior left chest wall between the seventh and eighth ribs (image 28 series 4). This is new in the interval. T2 compression fracture is new in the interval. No evidence for bony retropulsion of fragments into the spinal canal. No underlying lytic or sclerotic lesion is evident at this level.  CT ABDOMEN PELVIS FINDINGS  Hepatobiliary: No focal abnormality within the liver parenchyma. There is no evidence for gallstones, gallbladder wall thickening, or pericholecystic fluid. No intrahepatic or extrahepatic biliary dilation.  Pancreas: No focal mass lesion. No dilatation of the main duct. No intraparenchymal cyst. No peripancreatic edema.  Spleen: 2.1 cm focal hypo attenuating lesion is identified in the central spleen (image 9 series 8).  Adrenals/Urinary Tract: Interval progression of right adrenal mass, now measuring 4.4 x 5.9 cm compared to 2.2 x 3.9 cm previously. Left adrenal mass has also progressed, measuring 3.2 x 3.6 cm today compared to 2.1 x 2.4 cm previously. No focal abnormality in either kidney. No hydroureteronephrosis. The urinary bladder appears normal for the degree of distention.  Stomach/Bowel: Small to moderate hiatal hernia noted. Stomach otherwise unremarkable. Duodenum is normally positioned as is the ligament of Treitz. No small bowel wall thickening. No small bowel dilatation. The terminal ileum is normal. The appendix  is normal. Diverticular changes are noted in the left colon without evidence of diverticulitis. Prominent stool volume noted throughout the colon.  Vascular/Lymphatic: There is abdominal aortic atherosclerosis without aneurysm. 2.2 cm omental nodule on image 61 measured only 6 mm previously. 17 mm left upper quadrant soft tissue nodule seen previously has progressed, now measuring 3.8 x 3.9 cm. A new  2.2 cm nodule is seen anterior to the aortic bifurcation. 3.5 x 2.1 cm right internal iliac lymph node was 1.4 x 1.5 cm previously.  Reproductive: The prostate gland and seminal vesicles have normal imaging features.  Other: No intraperitoneal free fluid.  Musculoskeletal: Bone windows reveal no worrisome lytic or sclerotic osseous lesions.  IMPRESSION: 1. Interval development of 2 cm nodule in the posterior left chest wall compatible with metastatic involvement. 2. Enlarging right thyroid nodule, nonspecific but continued attention on follow-up recommended. 3. Interval progression of bilateral adrenal metastases. 4. Interval progression of omental, mesenteric, and pelvic sidewall metastases. 5. Interval development of a 2.1 cm focal splenic lesion, compatible with metastatic disease. 6. New compression deformity of the T12 vertebral body without underlying bony lesion evident. No evidence for posterior retropulsion of fragments into the spinal canal this level.   Electronically Signed   By: Misty Stanley M.D.   On: 03/11/2015 13:45   Ct Abdomen Pelvis W Contrast  03/11/2015   CLINICAL DATA:  Subsequent encounter for prostate cancer with brain metastases.  EXAM: CT CHEST, ABDOMEN AND PELVIS WITHOUT CONTRAST  TECHNIQUE: Multidetector CT imaging of the chest, abdomen and pelvis was performed following the standard protocol without IV contrast.  COMPARISON:  12/27/2014.  FINDINGS: CT CHEST FINDINGS  Mediastinum/Lymph Nodes: There is no axillary lymphadenopathy. No mediastinal lymphadenopathy. There is no hilar lymphadenopathy. The heart size is normal. No pericardial effusion. Coronary artery calcification is noted.  Bilateral thyroid nodules are again identified, left greater than right. A discrete right-sided nodule has progressed in the interval, measuring 19 mm today compared to 11 mm previously.  Lungs/Pleura: Architectural detail distorted by breathing motion. 4 mm left apical nodule seen previously is 3 mm  today (image 9 series 7). No new pulmonary parenchymal lesions.  Musculoskeletal: A 2 cm nodule is seen in the posterior left chest wall between the seventh and eighth ribs (image 28 series 4). This is new in the interval. T2 compression fracture is new in the interval. No evidence for bony retropulsion of fragments into the spinal canal. No underlying lytic or sclerotic lesion is evident at this level.  CT ABDOMEN PELVIS FINDINGS  Hepatobiliary: No focal abnormality within the liver parenchyma. There is no evidence for gallstones, gallbladder wall thickening, or pericholecystic fluid. No intrahepatic or extrahepatic biliary dilation.  Pancreas: No focal mass lesion. No dilatation of the main duct. No intraparenchymal cyst. No peripancreatic edema.  Spleen: 2.1 cm focal hypo attenuating lesion is identified in the central spleen (image 9 series 8).  Adrenals/Urinary Tract: Interval progression of right adrenal mass, now measuring 4.4 x 5.9 cm compared to 2.2 x 3.9 cm previously. Left adrenal mass has also progressed, measuring 3.2 x 3.6 cm today compared to 2.1 x 2.4 cm previously. No focal abnormality in either kidney. No hydroureteronephrosis. The urinary bladder appears normal for the degree of distention.  Stomach/Bowel: Small to moderate hiatal hernia noted. Stomach otherwise unremarkable. Duodenum is normally positioned as is the ligament of Treitz. No small bowel wall thickening. No small bowel dilatation. The terminal ileum is normal. The appendix is normal. Diverticular changes are noted  in the left colon without evidence of diverticulitis. Prominent stool volume noted throughout the colon.  Vascular/Lymphatic: There is abdominal aortic atherosclerosis without aneurysm. 2.2 cm omental nodule on image 61 measured only 6 mm previously. 17 mm left upper quadrant soft tissue nodule seen previously has progressed, now measuring 3.8 x 3.9 cm. A new 2.2 cm nodule is seen anterior to the aortic bifurcation. 3.5 x  2.1 cm right internal iliac lymph node was 1.4 x 1.5 cm previously.  Reproductive: The prostate gland and seminal vesicles have normal imaging features.  Other: No intraperitoneal free fluid.  Musculoskeletal: Bone windows reveal no worrisome lytic or sclerotic osseous lesions.  IMPRESSION: 1. Interval development of 2 cm nodule in the posterior left chest wall compatible with metastatic involvement. 2. Enlarging right thyroid nodule, nonspecific but continued attention on follow-up recommended. 3. Interval progression of bilateral adrenal metastases. 4. Interval progression of omental, mesenteric, and pelvic sidewall metastases. 5. Interval development of a 2.1 cm focal splenic lesion, compatible with metastatic disease. 6. New compression deformity of the T12 vertebral body without underlying bony lesion evident. No evidence for posterior retropulsion of fragments into the spinal canal this level.   Electronically Signed   By: Misty Stanley M.D.   On: 03/11/2015 13:45    Scheduled Meds: . dexamethasone  2 mg Oral Daily  . feeding supplement (GLUCERNA SHAKE)  237 mL Oral BID BM  . lactulose  10 g Oral BID  . LORazepam  0.5 mg Oral BID  . senna  1 tablet Oral BID   Continuous Infusions:   Active Problems:   Hypertension   Brain metastases ([place])   Small cell carcinoma of prostate ([place])   Acute encephalopathy    Time spent: >35 minutes     Kinnie Feil  [facility] Pager [phone]. If 7PM-7AM, please contact night-coverage at [email or web address], password [facility] 03/12/2015, 8:51 AM  LOS: 1 day

## 2015-03-12 NOTE — Progress Notes (Signed)
IP PROGRESS NOTE  Subjective:   He reports having a good night of sleep. No new complaint.  Objective: Vital signs in last 24 hours: Blood pressure 132/80, pulse 84, temperature 97.8 F (36.6 C), temperature source Oral, resp. rate 16, height 6\' 4"  (1.93 m), weight 172 lb (78.019 kg), SpO2 99 %.  Intake/Output from previous day: 10/03 0701 - 10/04 0700 In: 400 [P.O.:400] Out: 200 [Urine:200]  Physical Exam: Neurologic: Alert, follows commands, left leg weakness Abdomen: Soft and nontender, no mass    Lab Results:  Recent Labs  03/11/15 1223  WBC 6.9  HGB 15.0  HCT 45.9  PLT 282    BMET  Recent Labs  03/11/15 1226  NA 138  K 4.2  CO2 26  GLUCOSE 128  BUN 12.9  CREATININE 0.8  CALCIUM 9.6    Studies/Results: Ct Head W Wo Contrast  03/11/2015   CLINICAL DATA:  69 year old hypertensive male with small cell carcinoma of the prostate. Right frontal metastatic lesion resected 04/06/2014. Left frontal meningioma resected at same time. Post whole-brain radiation therapy completed December 2015. Last chemotherapy 02/20/2015. Restaging. Subsequent encounter.  EXAM: CT HEAD WITHOUT AND WITH CONTRAST  TECHNIQUE: Contiguous axial images were obtained from the base of the skull through the vertex without and with intravenous contrast  CONTRAST:  158mL OMNIPAQUE IOHEXOL 300 MG/ML  SOLN  COMPARISON:  02/01/2015 CT.  01/26/2015 MR.  FINDINGS: Postoperative changes superior frontal region bilaterally.  No new area of enhancement to suggest interval development of intracranial metastatic disease. If further delineation is clinically desired, MR imaging with contrast may be considered (more sensitive for subtle changes).  Similar appearance of 1 cm anterior falx calcified meningioma and plaque-like calcification.  No calvarial abnormality.  Prominent white matter changes may reflect result of treatment of tumor and/ or small vessel disease.  Mild global atrophy without hydrocephalus.  No  CT evidence of large acute infarct.  No intracranial hemorrhage.  Vascular calcifications.  Minimal partial opacification inferior mastoid air cells.  IMPRESSION: Postop changes frontal lobes appear stable.  No new area of enhancement to suggest interval development of intracranial metastatic disease.  Stable 1 cm calcified falx meningioma.  Prominent white matter changes which may reflect result treatment tumor and/ or small vessel disease.   Electronically Signed   By: Genia Del M.D.   On: 03/11/2015 13:59   Ct Chest W Contrast  03/11/2015   CLINICAL DATA:  Subsequent encounter for prostate cancer with brain metastases.  EXAM: CT CHEST, ABDOMEN AND PELVIS WITHOUT CONTRAST  TECHNIQUE: Multidetector CT imaging of the chest, abdomen and pelvis was performed following the standard protocol without IV contrast.  COMPARISON:  12/27/2014.  FINDINGS: CT CHEST FINDINGS  Mediastinum/Lymph Nodes: There is no axillary lymphadenopathy. No mediastinal lymphadenopathy. There is no hilar lymphadenopathy. The heart size is normal. No pericardial effusion. Coronary artery calcification is noted.  Bilateral thyroid nodules are again identified, left greater than right. A discrete right-sided nodule has progressed in the interval, measuring 19 mm today compared to 11 mm previously.  Lungs/Pleura: Architectural detail distorted by breathing motion. 4 mm left apical nodule seen previously is 3 mm today (image 9 series 7). No new pulmonary parenchymal lesions.  Musculoskeletal: A 2 cm nodule is seen in the posterior left chest wall between the seventh and eighth ribs (image 28 series 4). This is new in the interval. T2 compression fracture is new in the interval. No evidence for bony retropulsion of fragments into the spinal canal. No  underlying lytic or sclerotic lesion is evident at this level.  CT ABDOMEN PELVIS FINDINGS  Hepatobiliary: No focal abnormality within the liver parenchyma. There is no evidence for gallstones,  gallbladder wall thickening, or pericholecystic fluid. No intrahepatic or extrahepatic biliary dilation.  Pancreas: No focal mass lesion. No dilatation of the main duct. No intraparenchymal cyst. No peripancreatic edema.  Spleen: 2.1 cm focal hypo attenuating lesion is identified in the central spleen (image 9 series 8).  Adrenals/Urinary Tract: Interval progression of right adrenal mass, now measuring 4.4 x 5.9 cm compared to 2.2 x 3.9 cm previously. Left adrenal mass has also progressed, measuring 3.2 x 3.6 cm today compared to 2.1 x 2.4 cm previously. No focal abnormality in either kidney. No hydroureteronephrosis. The urinary bladder appears normal for the degree of distention.  Stomach/Bowel: Small to moderate hiatal hernia noted. Stomach otherwise unremarkable. Duodenum is normally positioned as is the ligament of Treitz. No small bowel wall thickening. No small bowel dilatation. The terminal ileum is normal. The appendix is normal. Diverticular changes are noted in the left colon without evidence of diverticulitis. Prominent stool volume noted throughout the colon.  Vascular/Lymphatic: There is abdominal aortic atherosclerosis without aneurysm. 2.2 cm omental nodule on image 61 measured only 6 mm previously. 17 mm left upper quadrant soft tissue nodule seen previously has progressed, now measuring 3.8 x 3.9 cm. A new 2.2 cm nodule is seen anterior to the aortic bifurcation. 3.5 x 2.1 cm right internal iliac lymph node was 1.4 x 1.5 cm previously.  Reproductive: The prostate gland and seminal vesicles have normal imaging features.  Other: No intraperitoneal free fluid.  Musculoskeletal: Bone windows reveal no worrisome lytic or sclerotic osseous lesions.  IMPRESSION: 1. Interval development of 2 cm nodule in the posterior left chest wall compatible with metastatic involvement. 2. Enlarging right thyroid nodule, nonspecific but continued attention on follow-up recommended. 3. Interval progression of bilateral  adrenal metastases. 4. Interval progression of omental, mesenteric, and pelvic sidewall metastases. 5. Interval development of a 2.1 cm focal splenic lesion, compatible with metastatic disease. 6. New compression deformity of the T12 vertebral body without underlying bony lesion evident. No evidence for posterior retropulsion of fragments into the spinal canal this level.   Electronically Signed   By: Misty Stanley M.D.   On: 03/11/2015 13:45   Ct Abdomen Pelvis W Contrast  03/11/2015   CLINICAL DATA:  Subsequent encounter for prostate cancer with brain metastases.  EXAM: CT CHEST, ABDOMEN AND PELVIS WITHOUT CONTRAST  TECHNIQUE: Multidetector CT imaging of the chest, abdomen and pelvis was performed following the standard protocol without IV contrast.  COMPARISON:  12/27/2014.  FINDINGS: CT CHEST FINDINGS  Mediastinum/Lymph Nodes: There is no axillary lymphadenopathy. No mediastinal lymphadenopathy. There is no hilar lymphadenopathy. The heart size is normal. No pericardial effusion. Coronary artery calcification is noted.  Bilateral thyroid nodules are again identified, left greater than right. A discrete right-sided nodule has progressed in the interval, measuring 19 mm today compared to 11 mm previously.  Lungs/Pleura: Architectural detail distorted by breathing motion. 4 mm left apical nodule seen previously is 3 mm today (image 9 series 7). No new pulmonary parenchymal lesions.  Musculoskeletal: A 2 cm nodule is seen in the posterior left chest wall between the seventh and eighth ribs (image 28 series 4). This is new in the interval. T2 compression fracture is new in the interval. No evidence for bony retropulsion of fragments into the spinal canal. No underlying lytic or sclerotic lesion is  evident at this level.  CT ABDOMEN PELVIS FINDINGS  Hepatobiliary: No focal abnormality within the liver parenchyma. There is no evidence for gallstones, gallbladder wall thickening, or pericholecystic fluid. No  intrahepatic or extrahepatic biliary dilation.  Pancreas: No focal mass lesion. No dilatation of the main duct. No intraparenchymal cyst. No peripancreatic edema.  Spleen: 2.1 cm focal hypo attenuating lesion is identified in the central spleen (image 9 series 8).  Adrenals/Urinary Tract: Interval progression of right adrenal mass, now measuring 4.4 x 5.9 cm compared to 2.2 x 3.9 cm previously. Left adrenal mass has also progressed, measuring 3.2 x 3.6 cm today compared to 2.1 x 2.4 cm previously. No focal abnormality in either kidney. No hydroureteronephrosis. The urinary bladder appears normal for the degree of distention.  Stomach/Bowel: Small to moderate hiatal hernia noted. Stomach otherwise unremarkable. Duodenum is normally positioned as is the ligament of Treitz. No small bowel wall thickening. No small bowel dilatation. The terminal ileum is normal. The appendix is normal. Diverticular changes are noted in the left colon without evidence of diverticulitis. Prominent stool volume noted throughout the colon.  Vascular/Lymphatic: There is abdominal aortic atherosclerosis without aneurysm. 2.2 cm omental nodule on image 61 measured only 6 mm previously. 17 mm left upper quadrant soft tissue nodule seen previously has progressed, now measuring 3.8 x 3.9 cm. A new 2.2 cm nodule is seen anterior to the aortic bifurcation. 3.5 x 2.1 cm right internal iliac lymph node was 1.4 x 1.5 cm previously.  Reproductive: The prostate gland and seminal vesicles have normal imaging features.  Other: No intraperitoneal free fluid.  Musculoskeletal: Bone windows reveal no worrisome lytic or sclerotic osseous lesions.  IMPRESSION: 1. Interval development of 2 cm nodule in the posterior left chest wall compatible with metastatic involvement. 2. Enlarging right thyroid nodule, nonspecific but continued attention on follow-up recommended. 3. Interval progression of bilateral adrenal metastases. 4. Interval progression of omental,  mesenteric, and pelvic sidewall metastases. 5. Interval development of a 2.1 cm focal splenic lesion, compatible with metastatic disease. 6. New compression deformity of the T12 vertebral body without underlying bony lesion evident. No evidence for posterior retropulsion of fragments into the spinal canal this level.   Electronically Signed   By: Misty Stanley M.D.   On: 03/11/2015 13:45    Medications: I have reviewed the patient's current medications.  Assessment/Plan:  1. 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, status post resection 04/06/2014  Staging CTs of the chest, abdomen, and pelvis 04/12/2014 confirmed a prostate mass  Prostate ultrasound 04/16/2014 confirmed a prostate mass with a biopsy consistent with small cell carcinoma  Extrinsic appearing cecal mass confirmed on colonoscopy 04/19/2014 with a biopsy revealing small cell carcinoma  Whole brain and prostate radiation 04/24/2014 through 05/16/2014  Cycle 1 carboplatin/etoposide beginning 05/22/2014 with Neulasta support  Cycle 2 carboplatin/etoposide 06/19/2014  Cycle 3 carboplatin/etoposide 07/17/2014  Restaging CT scans 08/10/2014 with near-complete resolution of the large mass involving the prostate gland; resolution of inferior perirectal and right ileocolonic mesenteric lymph nodes; progressive right internal iliac/presacral adenopathy and new small bilateral adrenal nodules; persistent soft tissue nodule involving the medial wall of the cecum.  Cycle 4 carboplatin/etoposide 08/14/2014  MRI brain 09/18/2014 with interval collapse of the bilateral posterior frontal resection cavities. Minimal enhancement at the right frontal resection site has decreased. Increased enhancement along the margins of the left frontal resection cavity favored to be postoperative. Small amount of new non-mass-like enhancement more posteriorly in the paramedian right frontoparietal  region likely postoperative/post  ischemic. Decreased size of 7 mm falcine mass. No evidence of new intracranial metastases.  cycle 5 carboplatin/etoposide 09/24/2014  CTs 10/10/2014 revealed enlargement of bilateral adrenal nodules, no other evidence of disease progression  CT 12/27/2014 with enlargement of adrenal metastases, a new peritoneal implant, and a recurrent iliac node   Cycle 1 temozolomide 01/11/2015  Cycle 2 temozolomide 02/20/2015  Restaging CTs 03/11/2015 confirmed a new metastatic chest wall nodule, progressive adrenal metastases, progression of omental/mesenteric metastases 2. Left frontal meningioma, WHO grade 1, status post resection 04/06/2014 3. Left leg weakness secondary to #1, progressive 4. 1 cmbrain lesion at the inferior falx noted on the MRI 03/21/2014, not resected 5. Diabetes  6. Hypertension  7. Hyperlipidemia 8. Neutropenia following cycle 1 carboplatin/etoposide. 9. Hospitalization with headache and weakness 01/26/2015 through 01/29/2015. 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 10. Altered mental status, most likely secondary to surgery/brain radiation 11. T2 compression fracture noted on the CT 03/11/2015  Mr. Prokop appears stable. His wife is not present this morning. He has progressive metastatic small cell carcinoma. I recommend Hospice care. He will need placement with hospice. He appears to be a candidate for [employer] [facility]. Recommendations: 1. [facility] hospice consult 2. Com[facility] care 3. I will follow him with the [facility] hospice program at discharge     LOS: 1 day   [facility], [place]  03/12/2015, 1:25 PM

## 2015-03-12 NOTE — Care Management Note (Signed)
Case Management Note  Patient Details  Name: Ivan Stark MRN: [id number] Date of Birth: 03-Jan-1946  Subjective/Objective:        69 yo admitted with brain mets            Action/Plan: Pt has been living at home with wife. Pt wife expresses that she is unable to handle him at home anymore.  Expected Discharge Date:   (unknown)               Expected Discharge Plan:  [place]  In-House Referral:  Clinical Social Work  Discharge planning Services  CM Consult  Post Acute Care Choice:    Choice offered to:     DME Arranged:    DME Agency:     HH Arranged:    [place] Agency:     Status of Service:  In process, will continue to follow  Medicare Important Message Given:    Date Medicare IM Given:    Medicare IM give by:    Date Additional Medicare IM Given:    Additional Medicare Important Message give by:     If discussed at [place] of Stay Meetings, dates discussed:    Additional Comments: This CM spoke to pt and wife at bedside for disposition planning. Wife states that she has been taking care of husband at home and recently "Dr. Benay Spice said he can't come back home". Wife states that they have started the process of Palliative Care at home with [facility]. This was confirmed with Stacie from [facility]. CM consult for residential hospice placement. SW consult placed and SW made aware that pt is already associated with [facility]. CM will continue to follow and assist as needed. Lynnell Catalan, RN 03/12/2015, 1:44 PM

## 2015-03-13 DIAGNOSIS — G9340 Encephalopathy, unspecified: Secondary | ICD-10-CM | POA: Diagnosis not present

## 2015-03-13 DIAGNOSIS — I10 Essential (primary) hypertension: Secondary | ICD-10-CM | POA: Diagnosis not present

## 2015-03-13 DIAGNOSIS — R443 Hallucinations, unspecified: Secondary | ICD-10-CM | POA: Diagnosis not present

## 2015-03-13 DIAGNOSIS — C7931 Secondary malignant neoplasm of brain: Secondary | ICD-10-CM | POA: Diagnosis not present

## 2015-03-13 DIAGNOSIS — C61 Malignant neoplasm of prostate: Secondary | ICD-10-CM | POA: Diagnosis not present

## 2015-03-13 MED ORDER — LORAZEPAM 1 MG PO TABS: 1.0000 mg | ORAL_TABLET | Freq: Four times a day (QID) | ORAL | Status: AC | PRN

## 2015-03-13 MED ORDER — LORAZEPAM 0.5 MG PO TABS: 0.5000 mg | ORAL_TABLET | Freq: Two times a day (BID) | ORAL | Status: AC

## 2015-03-13 MED ORDER — DEXAMETHASONE 2 MG PO TABS: 2.0000 mg | ORAL_TABLET | Freq: Two times a day (BID) | ORAL | Status: AC

## 2015-03-13 MED ORDER — OLANZAPINE 5 MG PO TABS: 5.0000 mg | ORAL_TABLET | Freq: Every day | ORAL | Status: AC

## 2015-03-13 NOTE — Progress Notes (Signed)
Pt discharged to [facility]. Left unit on stretcher pushed by [facility] personnel. Left in good condition. Vwilliams,rn.

## 2015-03-13 NOTE — Progress Notes (Signed)
Pt for discharge to [facility].  CSW facilitated pt discharge needs including contacting [facility], Erling Conte, faxing pt discharge summary to [facility], providing RN phone number to call report, and arranging ambulance transport for pt to [facility].  Pt wife at bedside to provide support during transition to [facility].  No further social work needs identified at this time.  CSW signing off.   Alison Murray, MSW, [place] Work [phone]

## 2015-03-13 NOTE — Discharge Summary (Signed)
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 He was recently admitted and discharged to skilled nursing facility where he had progressive failure to thrive, inability to ambulate, recurrent confusion with hallucinations, agitation. He used up his days at skilled nursing and was eventually discharged to home where he had family caretakers who were unable to provide for him. He was admitted for these reasons at the direction of oncology. At the time of admission he was lethargic but denied acute pain, shortness of breath, nausea, vomiting, diarrhea. He was confused and somewhat agitated.  Hospital Course:   Metastatic small cell carcinoma with probable progression of his intracranial metastatic disease based on CT head.  CT of the abdomen and pelvis demonstrated developing of a 2 cm nodule in the posterior left chest, generalized progression of disease.  Heand his wife  met with oncology who recommended inpatient hospice for ongoing care. The patient and his wife also met with palliative care who agreed with the medical oncologists assessment.  He has ongoing delirium/hallucinations with intermittent agitation.  He denied pain, shortness of breath. His goals of  care comfort only and he is DO NOT RESUSCITATE. He will be transferred to inpatient hospice for ongoing care.  Acute encephalopathy, agitation, hallucinations, all likely secondary to metastatic cancer to the brain, brain surgery, and radiation to the brain. He has improved with Zyprexa and Ativan.  He continued dexamethasone.    Chronic constipation, stable, continued multiple laxatives and stool softeners  Hypertension, hyperlipidemia, GERD, diabetes mellitus. Holding all medications except those for comfort.  Procedures:  CT head with and without contrast  CT abdomen and pelvis with contrast  Consultations:  Palliative care  Medical oncology  Discharge Exam: Filed Vitals:   03/13/15 0728  BP: 123/78  Pulse: 89  Temp: 97.5 F (36.4 C)  Resp: 16   Filed Vitals:   03/12/15 1327 03/12/15 2000 03/12/15 2151 03/13/15 0728  BP: 127/69  133/80 123/78  Pulse: 84 90 90 89  Temp: 98.3 F (36.8 C)  98.5 F (36.9 C) 97.5 F (36.4 C)  TempSrc: Oral  Oral Oral  Resp: 16  17 16   Height:      Weight:      SpO2: 95%  98% 100%    General: Frail adult male, no acute distress  Cardiovascular: Regular rate and rhythm, no murmurs rubs or gallops  Respiratory: Clear to auscultation bilaterally  Abdomen: NABS, scaphoid, nontender  MSK: Decreased tone and bulk, no lower extremity edema   Discharge Instructions      Discharge Instructions    Call MD for:  difficulty breathing, headache or visual disturbances    Complete by:  As directed      Call MD for:  hives    Complete by:  As directed  Call MD for:  persistant nausea and vomiting    Complete by:  As directed      Call MD for:  severe uncontrolled pain    Complete by:  As directed      Diet general    Complete by:  As directed      Increase activity slowly    Complete by:  As directed             Medication List    STOP taking these medications        acetaminophen 325 MG tablet  Commonly known as:  TYLENOL      ALPRAZolam 0.5 MG tablet  Commonly known as:  XANAX     aspirin EC 81 MG tablet     glimepiride 4 MG tablet  Commonly known as:  AMARYL     metFORMIN 500 MG 24 hr tablet  Commonly known as:  GLUCOPHAGE-XR     multivitamin with minerals Tabs tablet     naproxen sodium 220 MG tablet  Commonly known as:  ANAPROX     temozolomide 140 MG capsule  Commonly known as:  TEMODAR     vitamin C 1000 MG tablet     Vitamin D 2000 UNITS Caps      TAKE these medications        dexamethasone 2 MG tablet  Commonly known as:  DECADRON  Take 1 tablet (2 mg total) by mouth every 12 (twelve) hours.     GLUCERNA Liqd  Take 237 mLs by mouth 2 (two) times daily between meals.     HYDROcodone-acetaminophen 5-325 MG tablet  Commonly known as:  NORCO/VICODIN  Take 1 tablet by mouth every 4 (four) hours as needed for moderate pain.     Lactulose 20 GM/30ML Soln  Take 15 mLs (10 g total) by mouth 2 (two) times daily.     LORazepam 0.5 MG tablet  Commonly known as:  ATIVAN  Take 1 tablet (0.5 mg total) by mouth 2 (two) times daily.     LORazepam 1 MG tablet  Commonly known as:  ATIVAN  Take 1 tablet (1 mg total) by mouth every 6 (six) hours as needed for anxiety.     OLANZapine 5 MG tablet  Commonly known as:  ZYPREXA  Take 1 tablet (5 mg total) by mouth at bedtime.     ondansetron 4 MG tablet  Commonly known as:  ZOFRAN  Take 1 tablet (4 mg total) by mouth every 8 (eight) hours as needed for nausea or vomiting. Take prior to Temodar     senna 8.6 MG Tabs tablet  Commonly known as:  SENOKOT  Take 2 tablets (17.2 mg total) by mouth 2 (two) times daily. For constipation. Adjust as  needed       Follow-up Information    Follow up with Alesia Richards, MD.   Specialty:  Internal Medicine   Contact information:   [address] [place] [address] [phone]        The results of significant diagnostics from this hospitalization (including  imaging, microbiology, ancillary and laboratory) are listed below for reference.    Significant Diagnostic Studies: Dg Knee 2 Views Left  03/02/2015   CLINICAL DATA:  69 year old male with history of trauma from a fall complaining of left-sided knee pain.  EXAM: LEFT KNEE - 1-2 VIEW  COMPARISON:  No priors.  FINDINGS: Multiple views of the left knee demonstrate no acute displaced fracture, subluxation, dislocation, or  soft tissue abnormality.  IMPRESSION: No acute radiographic abnormality of the left knee.   Electronically Signed   By: Vinnie Langton M.D.   On: 03/02/2015 21:53   Ct Head W Wo Contrast  03/11/2015   CLINICAL DATA:  69 year old hypertensive male with small cell carcinoma of the prostate. Right frontal metastatic lesion resected 04/06/2014. Left frontal meningioma resected at same time. Post whole-brain radiation therapy completed December 2015. Last chemotherapy 02/20/2015. Restaging. Subsequent encounter.  EXAM: CT HEAD WITHOUT AND WITH CONTRAST  TECHNIQUE: Contiguous axial images were obtained from the base of the skull through the vertex without and with intravenous contrast  CONTRAST:  162m OMNIPAQUE IOHEXOL 300 MG/ML  SOLN  COMPARISON:  02/01/2015 CT.  01/26/2015 MR.  FINDINGS: Postoperative changes superior frontal region bilaterally.  No new area of enhancement to suggest interval development of intracranial metastatic disease. If further delineation is clinically desired, MR imaging with contrast may be considered (more sensitive for subtle changes).  Similar appearance of 1 cm anterior falx calcified meningioma and plaque-like calcification.  No calvarial abnormality.  Prominent white matter changes may reflect result of treatment of tumor and/ or small vessel disease.  Mild global atrophy without hydrocephalus.  No CT evidence of large acute infarct.  No intracranial hemorrhage.  Vascular calcifications.  Minimal partial opacification inferior mastoid air cells.  IMPRESSION: Postop  changes frontal lobes appear stable.  No new area of enhancement to suggest interval development of intracranial metastatic disease.  Stable 1 cm calcified falx meningioma.  Prominent white matter changes which may reflect result treatment tumor and/ or small vessel disease.   Electronically Signed   By: SGenia DelM.D.   On: 03/11/2015 13:59   Ct Chest W Contrast  03/11/2015   CLINICAL DATA:  Subsequent encounter for prostate cancer with brain metastases.  EXAM: CT CHEST, ABDOMEN AND PELVIS WITHOUT CONTRAST  TECHNIQUE: Multidetector CT imaging of the chest, abdomen and pelvis was performed following the standard protocol without IV contrast.  COMPARISON:  12/27/2014.  FINDINGS: CT CHEST FINDINGS  Mediastinum/Lymph Nodes: There is no axillary lymphadenopathy. No mediastinal lymphadenopathy. There is no hilar lymphadenopathy. The heart size is normal. No pericardial effusion. Coronary artery calcification is noted.  Bilateral thyroid nodules are again identified, left greater than right. A discrete right-sided nodule has progressed in the interval, measuring 19 mm today compared to 11 mm previously.  Lungs/Pleura: Architectural detail distorted by breathing motion. 4 mm left apical nodule seen previously is 3 mm today (image 9 series 7). No new pulmonary parenchymal lesions.  Musculoskeletal: A 2 cm nodule is seen in the posterior left chest wall between the seventh and eighth ribs (image 28 series 4). This is new in the interval. T2 compression fracture is new in the interval. No evidence for bony retropulsion of fragments into the spinal canal. No underlying lytic or sclerotic lesion is evident at this level.  CT ABDOMEN PELVIS FINDINGS  Hepatobiliary: No focal abnormality within the liver parenchyma. There is no evidence for gallstones, gallbladder wall thickening, or pericholecystic fluid. No intrahepatic or extrahepatic biliary dilation.  Pancreas: No focal mass lesion. No dilatation of the main duct. No  intraparenchymal cyst. No peripancreatic edema.  Spleen: 2.1 cm focal hypo attenuating lesion is identified in the central spleen (image 9 series 8).  Adrenals/Urinary Tract: Interval progression of right adrenal mass, now measuring 4.4 x 5.9 cm compared to 2.2 x 3.9 cm previously. Left adrenal mass has also progressed, measuring 3.2 x 3.6 cm today compared  to 2.1 x 2.4 cm previously. No focal abnormality in either kidney. No hydroureteronephrosis. The urinary bladder appears normal for the degree of distention.  Stomach/Bowel: Small to moderate hiatal hernia noted. Stomach otherwise unremarkable. Duodenum is normally positioned as is the ligament of Treitz. No small bowel wall thickening. No small bowel dilatation. The terminal ileum is normal. The appendix is normal. Diverticular changes are noted in the left colon without evidence of diverticulitis. Prominent stool volume noted throughout the colon.  Vascular/Lymphatic: There is abdominal aortic atherosclerosis without aneurysm. 2.2 cm omental nodule on image 61 measured only 6 mm previously. 17 mm left upper quadrant soft tissue nodule seen previously has progressed, now measuring 3.8 x 3.9 cm. A new 2.2 cm nodule is seen anterior to the aortic bifurcation. 3.5 x 2.1 cm right internal iliac lymph node was 1.4 x 1.5 cm previously.  Reproductive: The prostate gland and seminal vesicles have normal imaging features.  Other: No intraperitoneal free fluid.  Musculoskeletal: Bone windows reveal no worrisome lytic or sclerotic osseous lesions.  IMPRESSION: 1. Interval development of 2 cm nodule in the posterior left chest wall compatible with metastatic involvement. 2. Enlarging right thyroid nodule, nonspecific but continued attention on follow-up recommended. 3. Interval progression of bilateral adrenal metastases. 4. Interval progression of omental, mesenteric, and pelvic sidewall metastases. 5. Interval development of a 2.1 cm focal splenic lesion, compatible with  metastatic disease. 6. New compression deformity of the T12 vertebral body without underlying bony lesion evident. No evidence for posterior retropulsion of fragments into the spinal canal this level.   Electronically Signed   By: Misty Stanley M.D.   On: 03/11/2015 13:45   Ct Abdomen Pelvis W Contrast  03/11/2015   CLINICAL DATA:  Subsequent encounter for prostate cancer with brain metastases.  EXAM: CT CHEST, ABDOMEN AND PELVIS WITHOUT CONTRAST  TECHNIQUE: Multidetector CT imaging of the chest, abdomen and pelvis was performed following the standard protocol without IV contrast.  COMPARISON:  12/27/2014.  FINDINGS: CT CHEST FINDINGS  Mediastinum/Lymph Nodes: There is no axillary lymphadenopathy. No mediastinal lymphadenopathy. There is no hilar lymphadenopathy. The heart size is normal. No pericardial effusion. Coronary artery calcification is noted.  Bilateral thyroid nodules are again identified, left greater than right. A discrete right-sided nodule has progressed in the interval, measuring 19 mm today compared to 11 mm previously.  Lungs/Pleura: Architectural detail distorted by breathing motion. 4 mm left apical nodule seen previously is 3 mm today (image 9 series 7). No new pulmonary parenchymal lesions.  Musculoskeletal: A 2 cm nodule is seen in the posterior left chest wall between the seventh and eighth ribs (image 28 series 4). This is new in the interval. T2 compression fracture is new in the interval. No evidence for bony retropulsion of fragments into the spinal canal. No underlying lytic or sclerotic lesion is evident at this level.  CT ABDOMEN PELVIS FINDINGS  Hepatobiliary: No focal abnormality within the liver parenchyma. There is no evidence for gallstones, gallbladder wall thickening, or pericholecystic fluid. No intrahepatic or extrahepatic biliary dilation.  Pancreas: No focal mass lesion. No dilatation of the main duct. No intraparenchymal cyst. No peripancreatic edema.  Spleen: 2.1 cm  focal hypo attenuating lesion is identified in the central spleen (image 9 series 8).  Adrenals/Urinary Tract: Interval progression of right adrenal mass, now measuring 4.4 x 5.9 cm compared to 2.2 x 3.9 cm previously. Left adrenal mass has also progressed, measuring 3.2 x 3.6 cm today compared to 2.1 x 2.4 cm previously.  No focal abnormality in either kidney. No hydroureteronephrosis. The urinary bladder appears normal for the degree of distention.  Stomach/Bowel: Small to moderate hiatal hernia noted. Stomach otherwise unremarkable. Duodenum is normally positioned as is the ligament of Treitz. No small bowel wall thickening. No small bowel dilatation. The terminal ileum is normal. The appendix is normal. Diverticular changes are noted in the left colon without evidence of diverticulitis. Prominent stool volume noted throughout the colon.  Vascular/Lymphatic: There is abdominal aortic atherosclerosis without aneurysm. 2.2 cm omental nodule on image 61 measured only 6 mm previously. 17 mm left upper quadrant soft tissue nodule seen previously has progressed, now measuring 3.8 x 3.9 cm. A new 2.2 cm nodule is seen anterior to the aortic bifurcation. 3.5 x 2.1 cm right internal iliac lymph node was 1.4 x 1.5 cm previously.  Reproductive: The prostate gland and seminal vesicles have normal imaging features.  Other: No intraperitoneal free fluid.  Musculoskeletal: Bone windows reveal no worrisome lytic or sclerotic osseous lesions.  IMPRESSION: 1. Interval development of 2 cm nodule in the posterior left chest wall compatible with metastatic involvement. 2. Enlarging right thyroid nodule, nonspecific but continued attention on follow-up recommended. 3. Interval progression of bilateral adrenal metastases. 4. Interval progression of omental, mesenteric, and pelvic sidewall metastases. 5. Interval development of a 2.1 cm focal splenic lesion, compatible with metastatic disease. 6. New compression deformity of the T12  vertebral body without underlying bony lesion evident. No evidence for posterior retropulsion of fragments into the spinal canal this level.   Electronically Signed   By: Misty Stanley M.D.   On: 03/11/2015 13:45   Dg Hip Unilat With Pelvis 2-3 Views Left  03/02/2015   CLINICAL DATA:  [place] on 02/27/2015.  Left hip pain.  EXAM: DG HIP (WITH OR WITHOUT PELVIS) 2-3V LEFT  COMPARISON:  None.  FINDINGS: There is no evidence of hip fracture or dislocation. There is no evidence of arthropathy or other focal bone abnormality.  IMPRESSION: Negative.   Electronically Signed   By: Lucienne Capers M.D.   On: 03/02/2015 21:54    Microbiology: No results found for this or any previous visit (from the past 240 hour(s)).   Labs: Basic Metabolic Panel:  Recent Labs Lab 03/11/15 1226  NA 138  K 4.2  CO2 26  GLUCOSE 128  BUN 12.9  CREATININE 0.8  CALCIUM 9.6  MG 2.0   Liver Function Tests:  Recent Labs Lab 03/11/15 1226  AST 18  ALT 23  ALKPHOS 119  BILITOT 0.52  PROT 6.3*  ALBUMIN 2.8*   No results for input(s): LIPASE, AMYLASE in the last 168 hours. No results for input(s): AMMONIA in the last 168 hours. CBC:  Recent Labs Lab 03/11/15 1223  WBC 6.9  NEUTROABS 5.1  HGB 15.0  HCT 45.9  MCV 90.0  PLT 282   Cardiac Enzymes: No results for input(s): CKTOTAL, CKMB, CKMBINDEX, TROPONINI in the last 168 hours. BNP: BNP (last 3 results) No results for input(s): BNP in the last 8760 hours.  ProBNP (last 3 results) No results for input(s): PROBNP in the last 8760 hours.  CBG: No results for input(s): GLUCAP in the last 168 hours.  Time coordinating discharge: 35 minutes  Signed:  Pace Lamadrid  [facility] 03/13/2015, 12:41 PM

## 2015-03-13 NOTE — Progress Notes (Signed)
Palliative Medicine consult received. Ivan Stark is a 69 yo man with metastatic small cell lung cancer who has in the last few months had progressive disease despite multiple cycles of chemotherapy. He has not been ambulatory in two months per his wife and started developing aggression, unusual behavior and worsening confusion after the second round of Temodar started last month. He was in rehab at [facility] and maximized out his rehab benefit and was sent home where his care needs were much more than his wife could handle- she literally reports bruises from having to physically care for him.   The mental decline has been extremely difficult for her to handle in addition to his physical debility -his hallucinations and unusual behavior have been frightening to his wife and she feels like he is suffering.  Noted recommendations for [facility].   For mental status and hallucinations I have recommended starting Xyprexa. I reviewed his medication list for possible reversible causes of delirium- time line of symptoms and characteristics seem neurotoxic-a known side effect of Temodar. Prognosis is very poor. Suspect <4 weeks.  Will follow-up with Dr. Benay Spice - wife not fully clear on the plan or prognosis but she will support Dr. Gearldine Shown recommendations and expresses appreciation for his care and guidance during this difficult time.  Lane Hacker, DO Palliative Medicine [phone]

## 2015-03-13 NOTE — Progress Notes (Signed)
Report called to [facility]. Pt awaiting [facility] for transport. Vwilliams,rn.

## 2015-03-13 NOTE — Progress Notes (Signed)
   03/13/15 1100  Clinical Encounter Type  Visited With Patient;Other (Comment) (friend)  Visit Type Initial;Spiritual support;Social support  Referral From Nurse  Consult/Referral To Chaplain  Spiritual Encounters  Spiritual Needs Emotional  [facility] responded to spiritual consult; visited with pt and friend; both pt and friend are local pastors; visit was largely one of fellowship and support; pt asked Solomon to visit again, if possible. 11:38 AM Gwynn Burly

## 2015-03-13 NOTE — Clinical Social Work Note (Signed)
Clinical Social Work Assessment  Patient Details  Name: Ivan Stark MRN: [id number] Date of Birth: Apr 24, 1946  Date of referral:  03/13/15               Reason for consult:  Discharge Planning                Permission sought to share information with:  Family Supports Permission granted to share information::  Yes, Verbal Permission Granted  Name::     Ivan Stark  Agency::     Relationship::  wife  Contact Information:  [phone]  Housing/Transportation Living arrangements for the past 2 months:  Single Family Home Source of Information:  Spouse Patient Interpreter Needed:  None Criminal Activity/Legal Involvement Pertinent to Current Situation/Hospitalization:  No - Comment as needed Significant Relationships:  Spouse Lives with:  Spouse Do you feel safe going back to the place where you live?  No Need for family participation in patient care:  Yes (Comment)  Care giving concerns:  Pt admitted from home with pt wife. Pt wife has expressed to MD that she cannot continue to manage pt care at home. MD recommending residential hospice placement.   Social Worker assessment / plan:  CSW received referral for residential hospice.  Pt being assisted by nursing staff at attempted time of visit. CSW contacted pt wife, Ivan Stark via telephone. CSW introduced self and explained role. CSW provided supportive listening as pt wife discussed that she has been in conversation with Dr. Benay Spice and aware that pt will not be able to return home. CSW discussed with pt wife Dr. Benay Spice recommendation for residential hospice. Pt expressed understanding and states that Dr. Benay Spice has mentioned to pt wife. CSW offered choice for residential hospice facilities and pt wife chooses [facility].  CSW contacted [facility] liaison, Erling Conte and made referral to [facility]. CSW received notification from [facility], Erling Conte that pt has bed available today. [facility] liaison  notified pt wife.   CSW to facilitate pt discharge needs this afternoon.   CSW to continue to follow.   Employment status:  Retired Nurse, adult PT Recommendations:  Not assessed at this time Information / Referral to community resources:  Other (Comment Required) (residential hospice list)  Patient/Family's Response to care:  Pt wife supportive and actively involved. Pt wife expressed feeling overwhelmed, but aware that pt needs will best be met at [facility].  Patient/Family's Understanding of and Emotional Response to Diagnosis, Current Treatment, and Prognosis:  Pt wife coping as well as can be anticipated with pt prognosis.   Emotional Assessment Appearance:  Appears stated age Attitude/Demeanor/Rapport:  Other (pt appropriate) Affect (typically observed):  Appropriate Orientation:  Oriented to Self, Oriented to Place, Oriented to Situation Alcohol / Substance use:  Not Applicable Psych involvement (Current and /or in the community):     Discharge Needs  Concerns to be addressed:  Discharge Planning Concerns Readmission within the last 30 days:  No Current discharge risk:  Terminally ill Barriers to Discharge:  No Barriers Identified   [place], [place], [place]

## 2015-03-15 ENCOUNTER — Ambulatory Visit: Payer: Medicare Other | Admitting: Nurse Practitioner

## 2015-03-15 ENCOUNTER — Other Ambulatory Visit: Payer: Medicare Other

## 2015-03-26 ENCOUNTER — Telehealth: Payer: Self-pay | Admitting: Oncology

## 2015-03-26 NOTE — Telephone Encounter (Signed)
Received death certificate [birth year]

## 2015-03-29 ENCOUNTER — Ambulatory Visit: Payer: Self-pay | Admitting: Internal Medicine

## 2015-04-09 DEATH — deceased

## 2015-09-30 ENCOUNTER — Encounter: Payer: Self-pay | Admitting: Internal Medicine

## 2016-04-19 IMAGING — CT CT ABD-PELV W/ CM
2 of 5 series · 14 of 46 positions shown, 16 images · IV contrast (OMNIPAQUE)
Comparison: CT of the chest, abdomen and pelvis 10/10/2014.

CLINICAL DATA: Subsequent evaluation of a 68-year-old male with
history of prostate cancer with metastatic disease to the brain
undergoing ongoing chemotherapy. Restaging examination.

EXAM:
CT CHEST, ABDOMEN, AND PELVIS WITH CONTRAST
TECHNIQUE: Multidetector CT imaging of the chest, abdomen and pelvis was
performed following the standard protocol during bolus
administration of intravenous contrast.
CONTRAST:  100mL OMNIPAQUE IOHEXOL 300 MG/ML  SOLN

[Series 2: cap with st · axial · 0.83mm/px · z∈[-622,+18]mm · 11 of 144 slices shown, 13 images]
[im 8/144  soft-tissue]
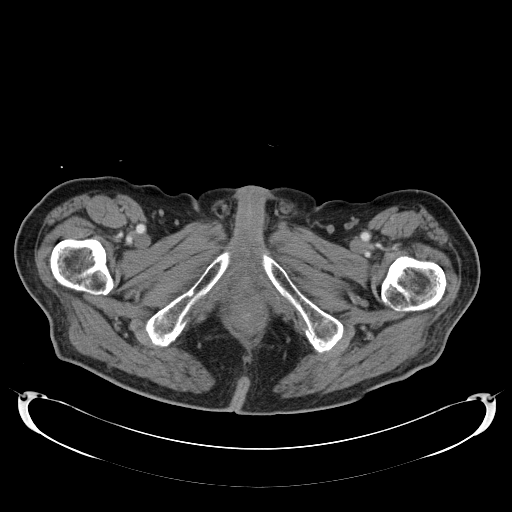
[im 8/144  bone]
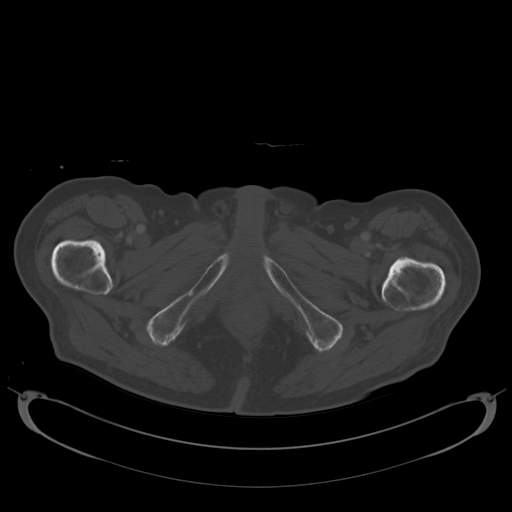
[im 24/144  soft-tissue]
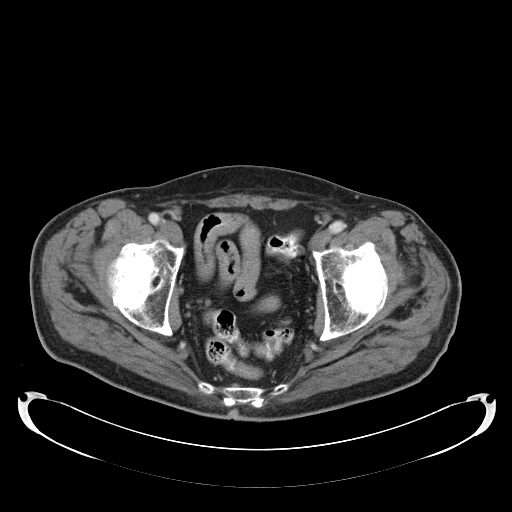
[im 32/144  soft-tissue]
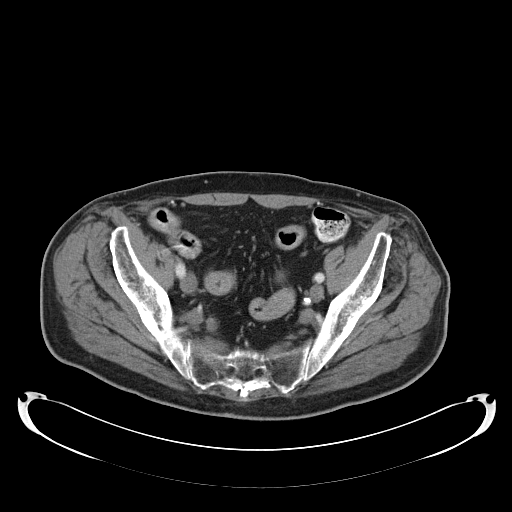
[im 48/144  soft-tissue]
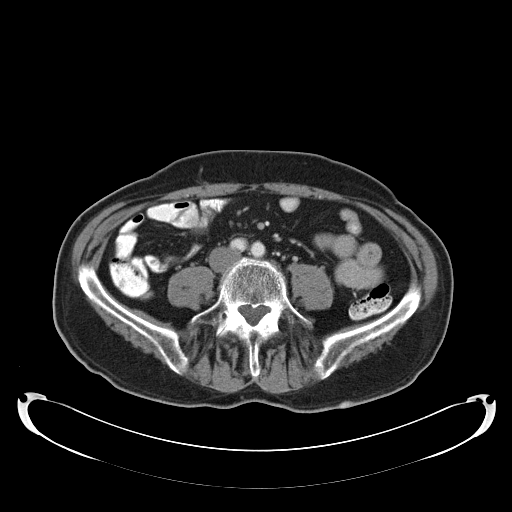
[im 56/144  soft-tissue]
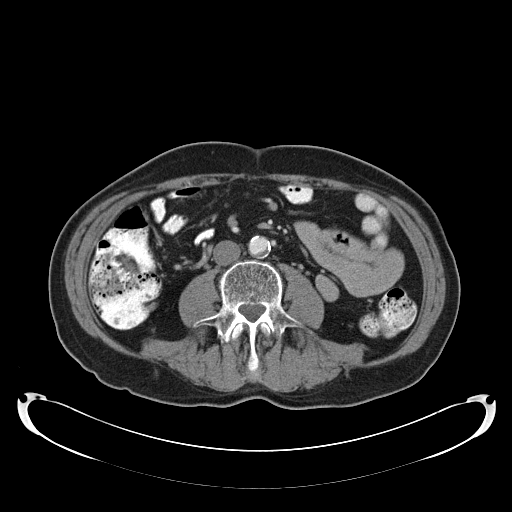
[im 72/144  soft-tissue]
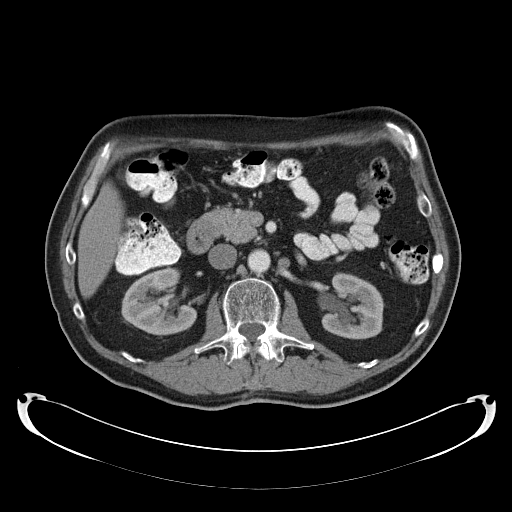
[im 88/144  soft-tissue]
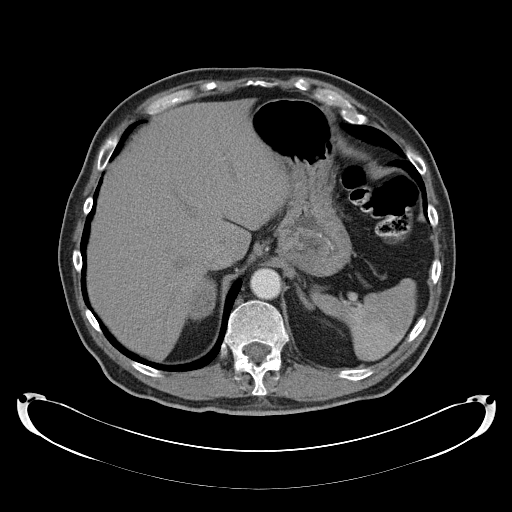
[im 96/144  soft-tissue]
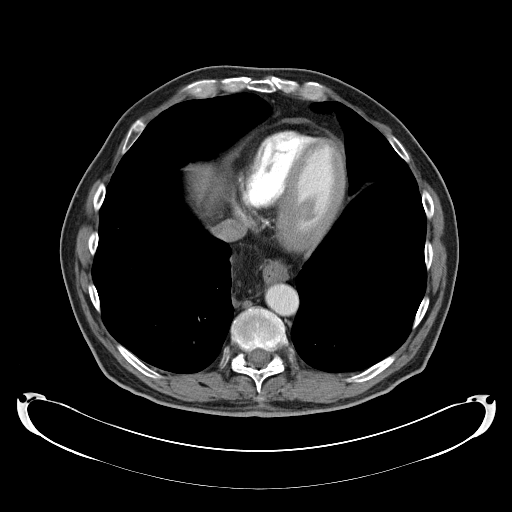
[im 112/144  soft-tissue]
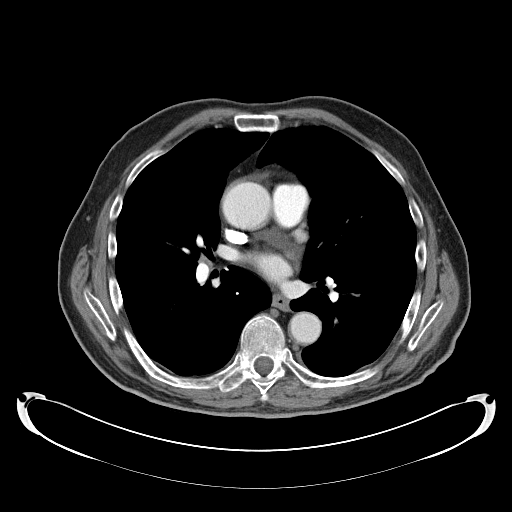
[im 112/144  bone]
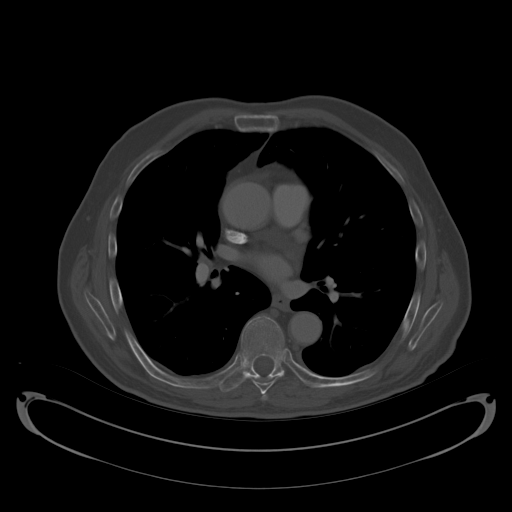
[im 120/144  soft-tissue]
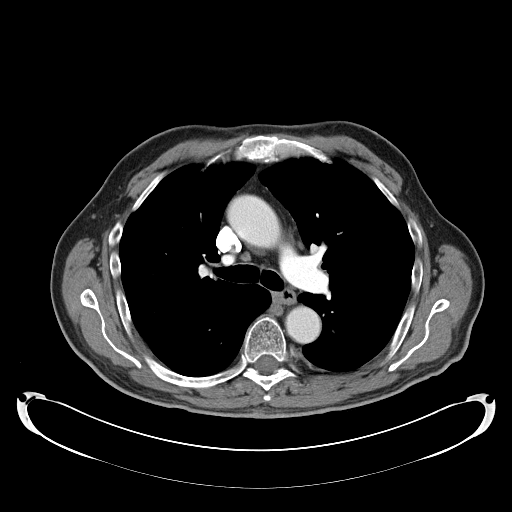
[im 136/144  soft-tissue]
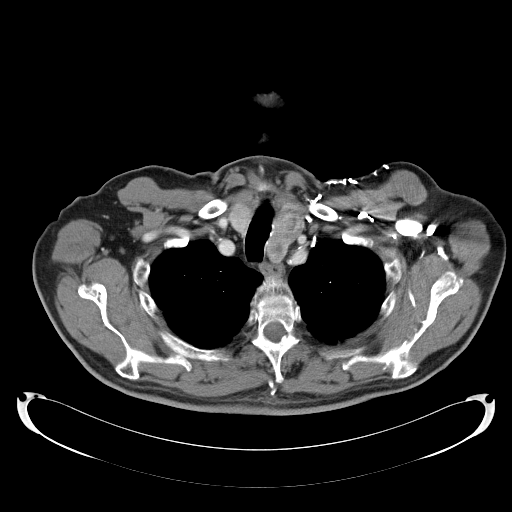

[Series 602: <mpr thick range> · coronal · 1.40mm/px · 3 of 99 slices shown]
[im 33/99  soft-tissue]
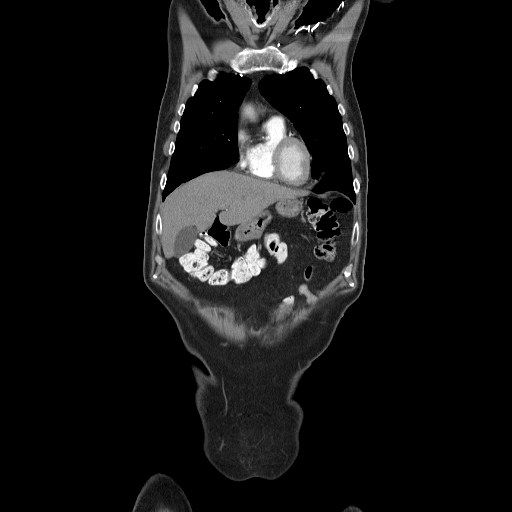
[im 44/99  soft-tissue]
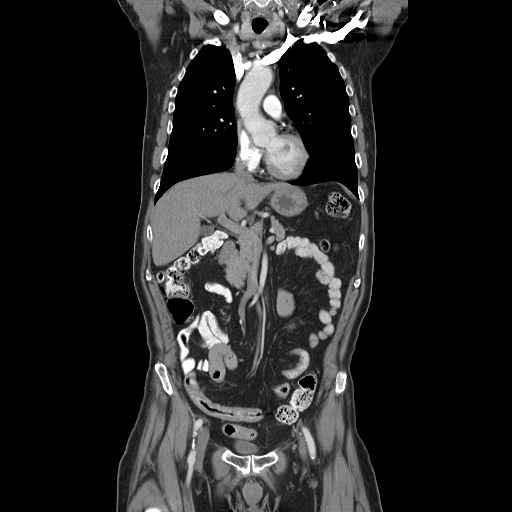
[im 55/99  soft-tissue]
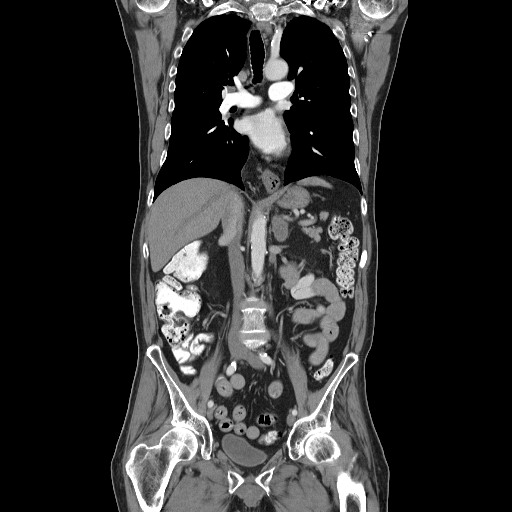

[14 of 46 positions shown; findings below may reference images not displayed]

FINDINGS: CT CHEST FINDINGS

Mediastinum/Lymph Nodes: Heart size is normal. Small amount of
anterior pericardial fluid and/or thickening, unlikely to be of any
hemodynamic significance at this time. No associated pericardial
calcification. There is atherosclerosis of the thoracic aorta, the
great vessels of the mediastinum and the coronary arteries,
including calcified atherosclerotic plaque in the left anterior
descending, left circumflex and right coronary arteries. No
pathologically enlarged mediastinal or hilar lymph nodes. Small
hiatal hernia with small amount of herniated fat and fluid, similar
to prior examination. No axillary lymphadenopathy. Thyroid gland is
again markedly heterogeneous in appearance with multiple large
nodules, largest of which is in the left lobe of the gland
(partially obscured by beam hardening artifact on today's study)
measuring approximately 2.0 cm in diameter (unchanged when measured
in a similar fashion on prior study 10/10/2014).

Lungs/Pleura: 4 mm left upper lobe nodule (image 11 of series 4) is
unchanged. Small areas of peripheral bronchiolectasis in the lateral
aspect of the right lower lobe and lateral segment of the right
middle lobe are both unchanged, most compatible with areas of mild
post infectious or inflammatory scarring. No acute consolidative
airspace disease. No pleural effusions. A few scattered subpleural
nodules are similar to the prior examination. In addition, there is
a new 5 mm subpleural nodule in the posterior aspect of the left
lower lobe (image 29 of series 2 and 4).

Musculoskeletal/Soft Tissues: There are no aggressive appearing
lytic or blastic lesions noted in the visualized portions of the
skeleton.

CT ABDOMEN AND PELVIS FINDINGS

Hepatobiliary: No suspicious appearing cystic or solid hepatic
lesions are identified. No intra or extrahepatic biliary ductal
dilatation. Gallbladder is normal in appearance.

Pancreas: No pancreatic mass. No pancreatic ductal dilatation. No
pancreatic or peripancreatic fluid or inflammatory changes.

Spleen: Unremarkable.

Adrenals/Urinary Tract: Enlargement of previously noted bilateral
adrenal lesions, currently with a 3.9 x 2.2 cm right adrenal mass,
and a 2.4 x 2.1 cm left adrenal nodule. Bilateral kidneys are normal
in appearance. No hydroureteronephrosis. Urinary bladder is normal
in appearance.

Stomach/Bowel: Normal appearance of the stomach. No pathologic
dilatation of small bowel or colon. A few scattered colonic
diverticulae are noted, without surrounding inflammatory changes to
suggest an acute diverticulitis at this time. Normal appendix.

Vascular/Lymphatic: Atherosclerosis throughout the abdominal and
pelvic vasculature, without evidence of aneurysm or dissection. New
1.2 x 1.7 cm soft tissue attenuation lesion in the left upper
quadrant of the abdomen may represent an enlarged lymph node in the
transverse mesocolon, or could simply represent a peritoneal
implants. Recurrent enlarged right internal iliac lymph node which
currently measures 1.4 cm in short axis (image 111 of series 2).

Reproductive: Prostate gland and seminal vesicles are unremarkable
in appearance.

Other: No significant volume of ascites.  No pneumoperitoneum.

Musculoskeletal: 19 mm soft tissue nodule in the subcutaneous fat of
the left lumbar region (image 83 of series 2) is unchanged, likely a
sebaceous cyst. Small sclerotic lesion in the left ilium immediately
lateral to the sacroiliac joint (image 110 of series 2) is
unchanged. There are no other new aggressive appearing lytic or
blastic lesions noted in the visualized portions of the skeleton.
IMPRESSION: 1. Today's study demonstrates progression of metastatic disease,
with recurrent right internal iliac lymph node, and enlarging
bilateral adrenal nodules/masses, as above.
2. New 5 mm subpleural nodule in the left lower lobe (image 29 of
series 2 and 4) is nonspecific and may simply represent a subpleural
lymph node. Attention on future follow-up imaging is recommended to
ensure the stability or resolution of this finding.
3. Atherosclerosis, including 3 vessel coronary artery disease.
Please note that although the presence of coronary artery calcium
documents the presence of coronary artery disease, the severity of
this disease and any potential stenosis cannot be assessed on this
non-gated CT examination. Assessment for potential risk factor
modification, dietary therapy or pharmacologic therapy may be
warranted, if clinically indicated.
4. Additional incidental findings, as above, similar prior
examinations.
